# Patient Record
Sex: Male | Born: 1975 | Race: White | Hispanic: No | Marital: Single | Smoking: Former smoker
Health system: Southern US, Community
[De-identification: ages and names within clinical notes are randomized; demographics above are authoritative.]

## PROBLEM LIST (undated history)

## (undated) DIAGNOSIS — G4733 Obstructive sleep apnea (adult) (pediatric): Secondary | ICD-10-CM

## (undated) DIAGNOSIS — I10 Essential (primary) hypertension: Secondary | ICD-10-CM

## (undated) DIAGNOSIS — F319 Bipolar disorder, unspecified: Secondary | ICD-10-CM

## (undated) DIAGNOSIS — K859 Acute pancreatitis without necrosis or infection, unspecified: Secondary | ICD-10-CM

## (undated) DIAGNOSIS — F32A Depression, unspecified: Secondary | ICD-10-CM

## (undated) DIAGNOSIS — E781 Pure hyperglyceridemia: Secondary | ICD-10-CM

## (undated) DIAGNOSIS — E119 Type 2 diabetes mellitus without complications: Secondary | ICD-10-CM

## (undated) DIAGNOSIS — G4730 Sleep apnea, unspecified: Secondary | ICD-10-CM

## (undated) DIAGNOSIS — L702 Acne varioliformis: Secondary | ICD-10-CM

## (undated) DIAGNOSIS — E7800 Pure hypercholesterolemia, unspecified: Secondary | ICD-10-CM

## (undated) DIAGNOSIS — Z87891 Personal history of nicotine dependence: Secondary | ICD-10-CM

## (undated) DIAGNOSIS — R351 Nocturia: Secondary | ICD-10-CM

## (undated) DIAGNOSIS — F419 Anxiety disorder, unspecified: Secondary | ICD-10-CM

## (undated) DIAGNOSIS — J45909 Unspecified asthma, uncomplicated: Secondary | ICD-10-CM

## (undated) DIAGNOSIS — L723 Sebaceous cyst: Secondary | ICD-10-CM

## (undated) HISTORY — DX: Nocturia: R35.1

## (undated) HISTORY — DX: Sebaceous cyst: L72.3

## (undated) HISTORY — DX: Type 2 diabetes mellitus without complications: E11.9

## (undated) HISTORY — DX: Depression, unspecified: F32.A

## (undated) HISTORY — DX: Essential (primary) hypertension: I10

## (undated) HISTORY — DX: Bipolar disorder, unspecified: F31.9

## (undated) HISTORY — DX: Pure hyperglyceridemia: E78.1

## (undated) HISTORY — DX: Personal history of nicotine dependence: Z87.891

## (undated) HISTORY — DX: Pure hypercholesterolemia, unspecified: E78.00

## (undated) HISTORY — DX: Anxiety disorder, unspecified: F41.9

## (undated) HISTORY — PX: OTHER SURGICAL HISTORY: SHX169

## (undated) HISTORY — DX: Unspecified asthma, uncomplicated: J45.909

## (undated) HISTORY — DX: Acne varioliformis: L70.2

## (undated) HISTORY — DX: Sleep apnea, unspecified: G47.30

---

## 2001-07-16 ENCOUNTER — Other Ambulatory Visit (HOSPITAL_COMMUNITY): Admission: RE | Admit: 2001-07-16 | Discharge: 2001-07-23 | Payer: Self-pay | Admitting: *Deleted

## 2001-08-31 ENCOUNTER — Encounter: Admission: RE | Admit: 2001-08-31 | Discharge: 2001-08-31 | Payer: Self-pay | Admitting: *Deleted

## 2001-09-28 ENCOUNTER — Encounter: Admission: RE | Admit: 2001-09-28 | Discharge: 2001-09-28 | Payer: Self-pay | Admitting: *Deleted

## 2001-10-29 ENCOUNTER — Encounter: Admission: RE | Admit: 2001-10-29 | Discharge: 2001-10-29 | Payer: Self-pay | Admitting: *Deleted

## 2002-01-27 ENCOUNTER — Encounter: Admission: RE | Admit: 2002-01-27 | Discharge: 2002-01-27 | Payer: Self-pay | Admitting: *Deleted

## 2002-02-24 ENCOUNTER — Encounter: Admission: RE | Admit: 2002-02-24 | Discharge: 2002-02-24 | Payer: Self-pay | Admitting: *Deleted

## 2002-06-22 ENCOUNTER — Encounter: Admission: RE | Admit: 2002-06-22 | Discharge: 2002-06-22 | Payer: Self-pay | Admitting: *Deleted

## 2002-10-05 ENCOUNTER — Encounter: Admission: RE | Admit: 2002-10-05 | Discharge: 2002-10-05 | Payer: Self-pay | Admitting: *Deleted

## 2004-11-05 ENCOUNTER — Ambulatory Visit: Payer: Self-pay | Admitting: Family Medicine

## 2006-02-09 ENCOUNTER — Ambulatory Visit: Payer: Self-pay | Admitting: Psychiatry

## 2006-02-09 ENCOUNTER — Inpatient Hospital Stay (HOSPITAL_COMMUNITY): Admission: RE | Admit: 2006-02-09 | Discharge: 2006-02-12 | Payer: Self-pay | Admitting: Psychiatry

## 2006-04-27 ENCOUNTER — Ambulatory Visit: Payer: Self-pay | Admitting: Family Medicine

## 2006-05-16 ENCOUNTER — Emergency Department (HOSPITAL_COMMUNITY): Admission: EM | Admit: 2006-05-16 | Discharge: 2006-05-16 | Payer: Self-pay | Admitting: Emergency Medicine

## 2006-05-18 ENCOUNTER — Inpatient Hospital Stay (HOSPITAL_COMMUNITY): Admission: RE | Admit: 2006-05-18 | Discharge: 2006-05-24 | Payer: Self-pay | Admitting: Psychiatry

## 2006-05-18 ENCOUNTER — Emergency Department (HOSPITAL_COMMUNITY): Admission: EM | Admit: 2006-05-18 | Discharge: 2006-05-18 | Payer: Self-pay | Admitting: Emergency Medicine

## 2006-05-19 ENCOUNTER — Ambulatory Visit: Payer: Self-pay | Admitting: Psychiatry

## 2006-12-11 ENCOUNTER — Emergency Department (HOSPITAL_COMMUNITY): Admission: EM | Admit: 2006-12-11 | Discharge: 2006-12-12 | Payer: Self-pay | Admitting: Emergency Medicine

## 2006-12-14 ENCOUNTER — Ambulatory Visit: Payer: Self-pay | Admitting: Pulmonary Disease

## 2006-12-14 ENCOUNTER — Inpatient Hospital Stay (HOSPITAL_COMMUNITY): Admission: EM | Admit: 2006-12-14 | Discharge: 2006-12-25 | Payer: Self-pay | Admitting: Emergency Medicine

## 2006-12-14 ENCOUNTER — Ambulatory Visit: Payer: Self-pay | Admitting: Family Medicine

## 2006-12-16 ENCOUNTER — Ambulatory Visit: Payer: Self-pay | Admitting: Internal Medicine

## 2006-12-25 ENCOUNTER — Ambulatory Visit: Payer: Self-pay | Admitting: Internal Medicine

## 2006-12-30 ENCOUNTER — Ambulatory Visit: Payer: Self-pay | Admitting: Family Medicine

## 2007-01-05 ENCOUNTER — Ambulatory Visit: Payer: Self-pay | Admitting: Internal Medicine

## 2007-01-06 ENCOUNTER — Ambulatory Visit: Payer: Self-pay | Admitting: Family Medicine

## 2007-01-06 LAB — CONVERTED CEMR LAB
AST: 23 units/L (ref 0–37)
Alkaline Phosphatase: 85 units/L (ref 39–117)
Bilirubin, Direct: 0.1 mg/dL (ref 0.0–0.3)
Cholesterol: 223 mg/dL (ref 0–200)
HDL: 40.1 mg/dL (ref >39.0)
Lipase: 61 units/L — ABNORMAL HIGH (ref 11.0–59.0)
Total Protein: 7.8 g/dL (ref 6.0–8.3)
VLDL: 70 mg/dL — ABNORMAL HIGH (ref 0–40)

## 2007-01-07 ENCOUNTER — Ambulatory Visit: Payer: Self-pay | Admitting: Cardiology

## 2007-01-20 ENCOUNTER — Encounter: Admission: RE | Admit: 2007-01-20 | Discharge: 2007-04-20 | Payer: Self-pay | Admitting: Family Medicine

## 2007-02-03 ENCOUNTER — Ambulatory Visit: Payer: Self-pay | Admitting: Family Medicine

## 2007-03-08 ENCOUNTER — Ambulatory Visit: Payer: Self-pay | Admitting: Internal Medicine

## 2007-03-09 ENCOUNTER — Ambulatory Visit: Payer: Self-pay | Admitting: Family Medicine

## 2007-03-23 ENCOUNTER — Ambulatory Visit: Payer: Self-pay | Admitting: Cardiology

## 2007-03-29 ENCOUNTER — Ambulatory Visit: Payer: Self-pay | Admitting: Family Medicine

## 2007-05-03 ENCOUNTER — Ambulatory Visit: Payer: Self-pay | Admitting: Family Medicine

## 2007-07-05 DIAGNOSIS — E119 Type 2 diabetes mellitus without complications: Secondary | ICD-10-CM

## 2007-07-05 HISTORY — DX: Type 2 diabetes mellitus without complications: E11.9

## 2007-08-03 ENCOUNTER — Ambulatory Visit: Payer: Self-pay | Admitting: Family Medicine

## 2007-08-03 DIAGNOSIS — L723 Sebaceous cyst: Secondary | ICD-10-CM | POA: Insufficient documentation

## 2007-08-03 HISTORY — DX: Sebaceous cyst: L72.3

## 2007-08-03 LAB — CONVERTED CEMR LAB
BUN: 10 mg/dL (ref 6–23)
Bilirubin, Direct: 0.1 mg/dL (ref 0.0–0.3)
CO2: 30 meq/L (ref 19–32)
Sodium: 140 meq/L (ref 135–145)
Total Protein: 7.4 g/dL (ref 6.0–8.3)

## 2007-08-10 ENCOUNTER — Telehealth: Payer: Self-pay | Admitting: Family Medicine

## 2007-09-10 ENCOUNTER — Ambulatory Visit (HOSPITAL_COMMUNITY): Admission: RE | Admit: 2007-09-10 | Discharge: 2007-09-10 | Payer: Self-pay | Admitting: Surgery

## 2007-09-10 ENCOUNTER — Encounter (INDEPENDENT_AMBULATORY_CARE_PROVIDER_SITE_OTHER): Payer: Self-pay | Admitting: Surgery

## 2007-10-18 ENCOUNTER — Telehealth: Payer: Self-pay | Admitting: Family Medicine

## 2007-10-29 ENCOUNTER — Ambulatory Visit: Payer: Self-pay | Admitting: Family Medicine

## 2007-10-29 LAB — CONVERTED CEMR LAB
ALT: 58 units/L — ABNORMAL HIGH (ref 0–53)
AST: 37 units/L (ref 0–37)
BUN: 14 mg/dL (ref 6–23)
Basophils Relative: 0.8 % (ref 0.0–1.0)
Bilirubin, Direct: 0.2 mg/dL (ref 0.0–0.3)
Creatinine, Ser: 0.9 mg/dL (ref 0.4–1.5)
Eosinophils Relative: 1.7 % (ref 0.0–5.0)
GFR calc Af Amer: 128 mL/min
Glucose, Bld: 91 mg/dL (ref 70–99)
HCT: 43 % (ref 39.0–52.0)
Hemoglobin: 14.9 g/dL (ref 13.0–17.0)
Monocytes Absolute: 0.7 10*3/uL (ref 0.2–0.7)
Neutro Abs: 4.1 10*3/uL (ref 1.4–7.7)
Neutrophils Relative %: 57.5 % (ref 43.0–77.0)
Platelets: 226 10*3/uL (ref 150–400)
RDW: 12 % (ref 11.5–14.6)
Sodium: 137 meq/L (ref 135–145)
TSH: 1.81 microintl units/mL (ref 0.35–5.50)
Total Protein: 7.5 g/dL (ref 6.0–8.3)

## 2008-07-29 ENCOUNTER — Ambulatory Visit: Payer: Self-pay | Admitting: Family Medicine

## 2008-09-15 ENCOUNTER — Ambulatory Visit: Payer: Self-pay | Admitting: Family Medicine

## 2008-09-15 DIAGNOSIS — J069 Acute upper respiratory infection, unspecified: Secondary | ICD-10-CM | POA: Insufficient documentation

## 2008-09-15 DIAGNOSIS — B9789 Other viral agents as the cause of diseases classified elsewhere: Secondary | ICD-10-CM

## 2009-07-26 ENCOUNTER — Ambulatory Visit: Payer: Self-pay | Admitting: Family Medicine

## 2009-07-31 ENCOUNTER — Ambulatory Visit: Payer: Self-pay | Admitting: Family Medicine

## 2009-07-31 DIAGNOSIS — Z87891 Personal history of nicotine dependence: Secondary | ICD-10-CM | POA: Insufficient documentation

## 2009-07-31 DIAGNOSIS — R040 Epistaxis: Secondary | ICD-10-CM | POA: Insufficient documentation

## 2009-07-31 HISTORY — DX: Personal history of nicotine dependence: Z87.891

## 2009-12-11 ENCOUNTER — Telehealth: Payer: Self-pay | Admitting: Family Medicine

## 2009-12-12 ENCOUNTER — Ambulatory Visit: Payer: Self-pay | Admitting: Family Medicine

## 2009-12-12 DIAGNOSIS — M25569 Pain in unspecified knee: Secondary | ICD-10-CM | POA: Insufficient documentation

## 2009-12-13 LAB — CONVERTED CEMR LAB
AST: 29 units/L (ref 0–37)
Basophils Absolute: 0.1 10*3/uL (ref 0.0–0.1)
Bilirubin, Direct: 0 mg/dL (ref 0.0–0.3)
CO2: 28 meq/L (ref 19–32)
Creatinine,U: 146.2 mg/dL
Eosinophils Absolute: 0.3 10*3/uL (ref 0.0–0.7)
Eosinophils Relative: 3.5 % (ref 0.0–5.0)
GFR calc non Af Amer: 104.44 mL/min (ref >60)
Glucose, Bld: 72 mg/dL (ref 70–99)
Hemoglobin: 14.1 g/dL (ref 13.0–17.0)
MCHC: 33.2 g/dL (ref 30.0–36.0)
MCV: 93.9 fL (ref 78.0–100.0)
Monocytes Absolute: 0.8 10*3/uL (ref 0.1–1.0)
Monocytes Relative: 10.6 % (ref 3.0–12.0)
Potassium: 3.9 meq/L (ref 3.5–5.1)
RBC: 4.53 M/uL (ref 4.22–5.81)
Sodium: 140 meq/L (ref 135–145)
TSH: 3.82 microintl units/mL (ref 0.35–5.50)
WBC: 7.5 10*3/uL (ref 4.5–10.5)

## 2010-01-10 ENCOUNTER — Ambulatory Visit: Payer: Self-pay | Admitting: Family Medicine

## 2010-07-04 ENCOUNTER — Ambulatory Visit: Payer: Self-pay | Admitting: Family Medicine

## 2010-07-04 DIAGNOSIS — L702 Acne varioliformis: Secondary | ICD-10-CM

## 2010-07-04 DIAGNOSIS — R351 Nocturia: Secondary | ICD-10-CM | POA: Insufficient documentation

## 2010-07-04 DIAGNOSIS — L709 Acne, unspecified: Secondary | ICD-10-CM | POA: Insufficient documentation

## 2010-07-04 HISTORY — DX: Nocturia: R35.1

## 2010-07-04 HISTORY — DX: Acne varioliformis: L70.2

## 2010-07-04 LAB — CONVERTED CEMR LAB
ALT: 36 units/L (ref 0–53)
AST: 25 units/L (ref 0–37)
Basophils Absolute: 0 10*3/uL (ref 0.0–0.1)
Basophils Relative: 0.6 % (ref 0.0–3.0)
Blood in Urine, dipstick: NEGATIVE
CO2: 25 meq/L (ref 19–32)
Cholesterol: 243 mg/dL — ABNORMAL HIGH (ref 0–200)
Creatinine, Ser: 1 mg/dL (ref 0.4–1.5)
Creatinine,U: 199.2 mg/dL
Eosinophils Relative: 3.7 % (ref 0.0–5.0)
GFR calc non Af Amer: 95.45 mL/min (ref >60)
Glucose, Urine, Semiquant: NEGATIVE
Hemoglobin: 14.4 g/dL (ref 13.0–17.0)
Ketones, urine, test strip: NEGATIVE
Lymphs Abs: 2.1 10*3/uL (ref 0.7–4.0)
MCV: 91.3 fL (ref 78.0–100.0)
Microalb Creat Ratio: 0.4 mg/g (ref 0.0–30.0)
Microalb, Ur: 0.7 mg/dL (ref 0.0–1.9)
Neutro Abs: 3.5 10*3/uL (ref 1.4–7.7)
Platelets: 179 10*3/uL (ref 150.0–400.0)
Protein, U semiquant: NEGATIVE
Specific Gravity, Urine: 1.02
Total Bilirubin: 0.4 mg/dL (ref 0.3–1.2)
Triglycerides: 773 mg/dL — ABNORMAL HIGH (ref 0.0–149.0)
Urobilinogen, UA: 0.2
VLDL: 154.6 mg/dL — ABNORMAL HIGH (ref 0.0–40.0)
pH: 6

## 2010-07-18 ENCOUNTER — Ambulatory Visit: Payer: Self-pay | Admitting: Family Medicine

## 2010-07-22 ENCOUNTER — Ambulatory Visit: Payer: Self-pay | Admitting: Family Medicine

## 2010-08-12 ENCOUNTER — Encounter
Admission: RE | Admit: 2010-08-12 | Discharge: 2010-08-12 | Payer: Self-pay | Source: Home / Self Care | Attending: Family Medicine | Admitting: Family Medicine

## 2010-10-07 ENCOUNTER — Ambulatory Visit: Payer: Self-pay | Admitting: Family Medicine

## 2010-10-07 LAB — CONVERTED CEMR LAB
Calcium: 9.5 mg/dL (ref 8.4–10.5)
Cholesterol: 286 mg/dL — ABNORMAL HIGH (ref 0–200)
Creatinine, Ser: 1 mg/dL (ref 0.4–1.5)
Glucose, Bld: 99 mg/dL (ref 70–99)
Potassium: 4.2 meq/L (ref 3.5–5.1)
Total CHOL/HDL Ratio: 7
Triglycerides: 788 mg/dL — ABNORMAL HIGH (ref 0.0–149.0)

## 2010-10-14 ENCOUNTER — Ambulatory Visit: Payer: Self-pay | Admitting: Family Medicine

## 2010-10-14 DIAGNOSIS — E781 Pure hyperglyceridemia: Secondary | ICD-10-CM

## 2010-10-14 HISTORY — DX: Pure hyperglyceridemia: E78.1

## 2010-10-30 ENCOUNTER — Encounter: Payer: Self-pay | Admitting: Family Medicine

## 2010-11-26 NOTE — Assessment & Plan Note (Signed)
Summary: FLU-SHOT/TETNUS SHOT/RCD   Nurse Visit   Allergies: 1)  ! Paxil 2)  ! Depakote 3)  ! * Concerta 4)  ! Oxycodone Hcl  Immunization History:  Tetanus/Td Immunization History:    Tetanus/Td:  Tdap (07/22/2010)  Influenza Immunization History:    Influenza:  Fluvax 3+ (07/22/2010)  Immunizations Administered:  Tetanus Vaccine:    Vaccine Type: Tdap    Site: left deltoid    Mfr: GlaxoSmithKline    Dose: 0.5 ml    Route: IM    Given by: Kern Reap CMA (AAMA)    Exp. Date: 08/15/2012    Lot #: [id number]    Physician counseled: yes  Influenza Vaccine # 1:    Vaccine Type: Fluvax 3+    Site: right deltoid    Mfr: GlaxoSmithKline    Dose: 0.5 ml    Route: IM    Given by: Kern Reap CMA (AAMA)    Exp. Date: 04/26/2011    Lot #: [id number]    Physician counseled: yes  Orders Added: 1)  Tdap => 49yrs IM [90715] 2)  Admin 1st Vaccine [90471] 3)  Flu Vaccine 41yrs + [29562] 4)  Admin of Any Addtl Vaccine [13086]

## 2010-11-26 NOTE — Progress Notes (Signed)
  Phone Note Call from Patient   Caller: Patient Call For: Roderick Pee MD Summary of Call: C/o knee pain- wants to know if he should see specialist or Dr Tawanna Cooler Initial call taken by: Raechel Ache, RN,  December 11, 2009 1:31 PM  Follow-up for Phone Call        [facility] to call for appt with Dr todd. Follow-up by: Raechel Ache, RN,  December 11, 2009 1:31 PM

## 2010-11-26 NOTE — Assessment & Plan Note (Signed)
Summary: ?MED PT IS URINATING IN SLEEP/NJR   Vital Signs:  Patient profile:   35 year old male Weight:      229 pounds Temp:     98.1 degrees F oral BP sitting:   130 / 90  (left arm) Cuff size:   regular  Vitals Entered By: Kathrynn Speed CMA (July 04, 2010 9:26 AM) CC: med or electric blanketing pt is not wake to urinate he is wetting his self, src Is Patient Diabetic? Yes   CC:  med or electric blanketing pt is not wake to urinate he is wetting his self and src.  History of Present Illness: Cody Short is a 35 year old single male, who comes in today with a 3 month history of frequent urination and nocturia.  He has a history of diabetes in the past, which he's been able to control with diet and exercise.  However, most recently, he gotten off his diet he's gained weight.  He septa to 29, and he status the frequency of urination.  Review of systems otherwise negative.  He also has problems with a fungal infection of two toenails and his acne on his back has flared up............ when he was a teenager.  He was treated with Accutane.  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 1 Qam  3  Pm 2)  Fish Oil  Oil (Fish Oil) .... Take 2 Tabs Two Times A Day 3)  Seroquel Xr 400 Mg Xr24h-Tab (Quetiapine Fumarate) .... Take 2 Tabs At Bedtime 4)  Doxycycline Hyclate 100 Mg Caps (Doxycycline Hyclate) .... Take 1 Tablet By Mouth Two Times A Day  Allergies (verified): 1)  ! Paxil 2)  ! Depakote 3)  ! * Concerta 4)  ! Oxycodone Hcl  Past History:  Past medical, surgical, family and social histories (including risk factors) reviewed for relevance to current acute and chronic problems.  Past Medical History: Reviewed history from 07/05/2007 and no changes required. Bipolar Acne Drug  Abuse Diabetes mellitus, type II  Past Surgical History: Reviewed history from 07/05/2007 and no changes required. Denies surgical history  Family History: Reviewed history and no changes required.  Social History: Reviewed history from 12/12/2009 and no changes required. Former Smoker  quit 08 Alcohol use-no  Review of Systems      See HPI  Physical Exam  General:  Well-developed,well-nourished,in no acute distress; alert,appropriate and cooperative throughout examination Skin:  2+ posterior acne on his back.......... the left great toenail and the third nail have fungus, and they were trimmed   Problems:  Medical Problems Added: 1)  Dx of Acne Varioliformis  (ICD-706.0) 2)  Dx of Diabetes-type 2  (ICD-250.00) 3)  Dx of Nocturia  (ZOX-096.04)  Impression & Recommendations:  Problem # 1:  DIABETES-TYPE 2 (ICD-250.00) Assessment New  His updated medication list for this problem includes:    Metformin Hcl 500 Mg Tabs (Metformin hcl) .Marland Kitchen... 1/2 qam  Orders: Venipuncture (54098) TLB-Lipid Panel (80061-LIPID) TLB-BMP (Basic Metabolic Panel-BMET) (80048-METABOL) TLB-CBC Platelet - w/Differential (85025-CBCD) TLB-Hepatic/Liver Function Pnl (80076-HEPATIC) TLB-TSH (Thyroid Stimulating Hormone) (84443-TSH) TLB-A1C / Hgb A1C (Glycohemoglobin) (83036-A1C) TLB-Microalbumin/Creat Ratio, Urine (82043-MALB) Diabetic Clinic Referral (Diabetic) Prescription Created Electronically [phone]) Specimen Handling (78295)  Problem # 2:  ACNE VARIOLIFORMIS (ICD-706.0) Assessment: Deteriorated  Orders: Prescription Created Electronically [id number])  Complete Medication List: 1)  Lithium Carbonate 300 Mg Caps (Lithium carbonate) .Marland Kitchen.. 1 qam  3  pm 2)  Fish Oil Oil (Fish oil) .... Take 2 tabs two times a day 3)  Seroquel Xr 400 Mg Xr24h-tab (Quetiapine fumarate) .... Take 2 tabs at bedtime 4)  Doxycycline Hyclate 100 Mg Caps (Doxycycline hyclate) .... Take 1 tablet by mouth two times a  day 5)  Onetouch Basic System W/device Kit (Blood glucose monitoring suppl) .... Use daily for glucose control 6)  Metformin Hcl 500 Mg Tabs (Metformin hcl) .... 1/2 qam 7)  Septra Ds 800-160 Mg Tabs (Sulfamethoxazole-trimethoprim) .Marland Kitchen.. 1 tab @ bedtime for acne 8)  Onetouch Test Strp (Glucose blood) .... Use once daily for fasting glucose test 9)  Onetouch Lancets Misc (Lancets) .... Use once daily for glucose testing  Other Orders: UA Dipstick w/o Micro (manual) (04540)  Patient Instructions: 1)  stay on a 2000-calorie no carbohydrate diet. 2)  Walk 30 minutes daily, drink, 30 ounces of water daily, begin Glucophage 250 mg one tablet prior to breakfast.  Check a fasting blood sugar daily in the morning.  Return in two weeks for follow-up.  We will also get you set up to go to see the dietitian. 3)  saok file  y  nails weekly. 4)  Begin Septra one daily at bedtime for the acne Prescriptions: ONETOUCH LANCETS  MISC (LANCETS) use once daily for glucose testing  #90 x 1   Entered by:   Kern Reap CMA (AAMA)   Authorized by:   Roderick Pee MD   Signed by:   Kern Reap CMA (AAMA) on 07/04/2010   Method used:   Electronically to        [facility]ail[facility]       [address]       [place], [place]  [address]       Ph: [phone]       Fax: [phone]   RxID:   [id number] ONETOUCH TEST  STRP (GLUCOSE BLOOD) use once daily for fasting glucose test  #90 x 1   Entered by:   Kern Reap CMA (AAMA)   Authorized by:   Roderick Pee MD   Signed by:   Kern Reap CMA (AAMA) on 07/04/2010   Method used:   Electronically to        [facility]       [address]       [place], [place]  [address]       Ph: [phone]       Fax: [phone]   RxID:   [id number] SEPTRA DS 800-160 MG TABS (SULFAMETHOXAZOLE-TRIMETHOPRIM) 1 tab @ bedtime for acne  #100 x 3   Entered and Authorized by:    Roderick Pee MD   Signed by:   Roderick Pee MD on 07/04/2010   Method used:   Electronically to        [facility]       [address]       [place], [place]  [address]       Ph: [phone]       Fax: [phone]   RxID:   [id number] METFORMIN HCL 500 MG TABS (METFORMIN HCL) 1/2 qam  #100 x 2   Entered and Authorized by:   Roderick Pee MD  Signed by:   Roderick Pee MD on 07/04/2010   Method used:   Electronically to        [facility]       [address]       [place], [place]  [address]       Ph: [phone]       Fax: [phone]   RxID:   [id number]   Laboratory Results   Urine Tests  Date/Time Received: July 04, 2010   Routine Urinalysis   Color: yellow Appearance: Clear Glucose: negative   (Normal Range: Negative) Bilirubin: negative   (Normal Range: Negative) Ketone: negative   (Normal Range: Negative) Spec. Gravity: 1.020   (Normal Range: 1.003-1.035) Blood: negative   (Normal Range: Negative) pH: 6.0   (Normal Range: 5.0-8.0) Protein: negative   (Normal Range: Negative) Urobilinogen: 0.2   (Normal Range: 0-1) Nitrite: negative   (Normal Range: Negative) Leukocyte Esterace: negative   (Normal Range: Negative)    Comments: Kern Reap CMA (AAMA)  July 04, 2010 10:08 AM

## 2010-11-26 NOTE — Assessment & Plan Note (Signed)
Summary: 2 week fup//ccm   Vital Signs:  Patient profile:   35 year old male Height:      69 inches Weight:      218 pounds BMI:     32.31 Temp:     98.5 degrees F oral BP sitting:   124 / 80  (left arm) Cuff size:   regular  Vitals Entered By: Kern Reap CMA Duncan Dull) (July 18, 2010 11:40 AM)  CC: follow-up visit Is Patient Diabetic? Yes Did you bring your meter with you today? No Pain Assessment Patient in pain? no        CC:  follow-up visit.  History of Present Illness: Cody Short is a 35 year old single male, type II diabetic, who comes in today for evaluation.  He is on metformin 500 mg one half tab prior to breakfast.  Blood sugars have dropped back to normal.  Hemoglobin A1c6 .2%.  His psychiatrist has decreased his Seroquel, which is helped him.  He is less sleepy.  Also, his triglycerides are markedly elevated.  We started him on Septra DS, one daily for severe acne.  His acne is much improved.  He is soaking and filing his nails weekly because he has a fungal infection in his toenails.  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    Allergies: 1)  ! Paxil 2)  ! Depakote 3)  ! * Concerta 4)  ! Oxycodone Hcl  Past History:  Past medical, surgical, family and social histories (including risk factors) reviewed for relevance to current acute and chronic problems.  Past Medical History: Reviewed history from 07/05/2007 and no changes required. Bipolar Acne Drug Abuse Diabetes mellitus, type II  Past Surgical History: Reviewed history from 07/05/2007 and no changes required. Denies surgical history  Family History: Reviewed history and no changes required. Family History Diabetes 1st degree relative  Social History: Reviewed history from 12/12/2009 and no changes  required. Former Smoker  quit 08 Alcohol use-no Single Drug use-no Regular exercise-yes Drug Use:  no Does Patient Exercise:  yes  Review of Systems      See HPI  Physical Exam  General:  Well-developed,well-nourished,in no acute distress; alert,appropriate and cooperative throughout examination Skin:  about a 50% decrease in the posterior lesions on his back   Impression & Recommendations:  Problem # 1:  ACNE VARIOLIFORMIS (ICD-706.0) Assessment Improved  Problem # 2:  DIABETES-TYPE 2 (ICD-250.00) Assessment: Improved  His updated medication list for this problem includes:    Metformin Hcl 500 Mg Tabs (Metformin hcl) .Marland Kitchen... 1/2 qam  Complete Medication List: 1)  Lithium Carbonate 300 Mg Caps (Lithium carbonate) .Marland Kitchen.. 1 qam  3  pm 2)  Fish Oil Oil (Fish oil) .... Take 2 tabs two times a day 3)  Seroquel Xr 400 Mg Xr24h-tab (Quetiapine fumarate) .... Take 1 and half  tabs at bedtime 4)  Doxycycline Hyclate 100 Mg Caps (Doxycycline hyclate) .... Take 1 tablet by mouth two times a day 5)  Onetouch Basic System W/device Kit (Blood glucose monitoring suppl) .... Use daily for glucose control 6)  Metformin Hcl 500 Mg Tabs (Metformin hcl) .... 1/2 qam 7)  Septra Ds 800-160 Mg Tabs (Sulfamethoxazole-trimethoprim) .Marland Kitchen.. 1 tab @ bedtime for acne 8)  Onetouch Test Strp (Glucose blood) .... Use once daily  for fasting glucose test 9)  Onetouch Lancets Misc (Lancets) .... Use once daily for glucose testing  Patient Instructions: 1)  See your eye doctor yearly to check for diabetic eye damage. 2)  continue the Septra once daily for your skin. 3)  Continue the metformin one half tablet prior to breakfast and check a fasting blood sugar Monday, Wednesday, Friday. 4)  Continue your exercise program 5)  Please schedule a follow-up appointment in 3 months...250.01 6)  BMP prior to visit, ICD-9: 7)  Lipid Panel prior to visit, ICD-9: 8)  HbgA1C prior to visit, ICD-9:

## 2010-11-26 NOTE — Assessment & Plan Note (Signed)
Summary: knee pain//ccm   Vital Signs:  Patient profile:   35 year old male Weight:      216 pounds Temp:     99.0 degrees F oral BP sitting:   120 / 84  (left arm) Cuff size:   regular  Vitals Entered By: Kern Reap CMA Duncan Dull) (December 12, 2009 9:43 AM)  Reason for Visit left knee pain  History of Present Illness: Cody Short is a 35 year old single male, who comes in today for evaluation of pain in his left knee x 2 months.  No history of trauma.  He points to the retinaculum and the medial joint line as the source of his pain.  He has no swelling or locking.  No history of trauma.  His last cigarette was 3 years ago.  His fasting blood sugar is 100  Allergies: 1)  ! Paxil 2)  ! Depakote 3)  ! * Concerta 4)  ! Oxycodone Hcl  Social History: Reviewed history from 07/05/2007 and no changes required. Former Smoker  quit 08 Alcohol use-no  Review of Systems      See HPI  Physical Exam  General:  Well-developed,well-nourished,in no acute distress; alert,appropriate and cooperative throughout examination Msk:  No deformity or scoliosis noted of thoracic or lumbar spine.   Pulses:  R and L carotid,radial,femoral,dorsalis pedis and posterior tibial pulses are full and equal bilaterally Extremities:  No clubbing, cyanosis, edema, or deformity noted with normal full range of motion of all joints.   Neurologic:  No cranial nerve deficits noted. Station and gait are normal. Plantar reflexes are down-going bilaterally. DTRs are symmetrical throughout. Sensory, motor and coordinative functions appear intact.   Impression & Recommendations:  Problem # 1:  KNEE PAIN, LEFT (ICD-719.46) Assessment New  Orders: Venipuncture (66440) TLB-BMP (Basic Metabolic Panel-BMET) (80048-METABOL) TLB-TSH (Thyroid Stimulating Hormone) (84443-TSH) TLB-CBC Platelet - w/Differential (85025-CBCD) TLB-Hepatic/Liver Function Pnl (80076-HEPATIC) TLB-A1C / Hgb A1C (Glycohemoglobin)  (83036-A1C) TLB-Microalbumin/Creat Ratio, Urine (82043-MALB) T-Knee Comp Left 4 Views (34742VZ)  Complete Medication List: 1)  Lithium Carbonate 300 Mg Caps (Lithium carbonate) .Marland Kitchen.. 1 qam  3  pm 2)  Fish Oil Oil (Fish oil) .... Take 2 tabs two times a day 3)  Seroquel Xr 400 Mg Xr24h-tab (Quetiapine fumarate) .... Take 2 tabs at bedtime  Patient Instructions: 1)  take 600 mg of Motrin 3 times a day with food.  Elevate and ice for 15 minutes prior to bedtime.  Goes to the main office now for x-rays of the left knee.  I will callu   The report tomorrow

## 2010-11-26 NOTE — Assessment & Plan Note (Signed)
Summary: cpx/cjr   History of Present Illness: Cody Short is a 35 year old single male, nonsmoker, who comes in today for physical examination  He has a history of underlying bipolar depression and is on a combination of Seroquel 400 mg dose two tabs nightly and lithium 300 mg dose one the a.m. 3 in the p.m..  His psychiatrist is Dr. Haywood Lasso, who he sees every 3 months.  He continues to have pain in his left knee, but is much improved.  He went to see the orthopedist.  Evaluation there also was negative.  He was given an anti-inflammatory and start on physical therapy.  However he developed abdominal pain.  Therefore advised to stop the anti-inflammatory.  Because of his previous history of hyperglycemia.  He monitors his blood sugar frequently.  Blood sugar 72, with an A1c of 6.0%.  as a teenager.  He had 3 rounds of Accutane for severe acne.  He has some minor and acne on his back he would like treated  Allergies: 1)  ! Paxil 2)  ! Depakote 3)  ! * Concerta 4)  ! Oxycodone Hcl  Past History:  Past medical, surgical, family and social histories (including risk factors) reviewed, and no changes noted (except as noted below).  Past Medical History: Reviewed history from 07/05/2007 and no changes required. Bipolar Acne Drug Abuse Diabetes mellitus, type II  Past Surgical History: Reviewed history from 07/05/2007 and no changes required. Denies surgical history  Family History: Reviewed history and no changes required.  Social History: Reviewed history from 12/12/2009 and no changes required. Former Smoker  quit 08 Alcohol use-no  Review of Systems      See HPI  Physical Exam  General:  Well-developed,well-nourished,in no acute distress; alert,appropriate and cooperative throughout examination Head:  Normocephalic and atraumatic without obvious abnormalities. No apparent alopecia or balding. Eyes:  No corneal or conjunctival inflammation noted. EOMI. Perrla. Funduscopic  exam benign, without hemorrhages, exudates or papilledema. Vision grossly normal. Ears:  External ear exam shows no significant lesions or deformities.  Otoscopic examination reveals clear canals, tympanic membranes are intact bilaterally without bulging, retraction, inflammation or discharge. Hearing is grossly normal bilaterally. Nose:  External nasal examination shows no deformity or inflammation. Nasal mucosa are pink and moist without lesions or exudates. Mouth:  Oral mucosa and oropharynx without lesions or exudates.  Teeth in good repair. Neck:  No deformities, masses, or tenderness noted. Chest Wall:  No deformities, masses, tenderness or gynecomastia noted. Breasts:  No masses or gynecomastia noted Lungs:  Normal respiratory effort, chest expands symmetrically. Lungs are clear to auscultation, no crackles or wheezes. Heart:  Normal rate and regular rhythm. S1 and S2 normal without gallop, murmur, click, rub or other extra sounds. Abdomen:  Bowel sounds positive,abdomen soft and non-tender without masses, organomegaly or hernias noted. Genitalia:  Testes bilaterally descended without nodularity, tenderness or masses. No scrotal masses or lesions. No penis lesions or urethral discharge. Msk:  No deformity or scoliosis noted of thoracic or lumbar spine.   Pulses:  R and L carotid,radial,femoral,dorsalis pedis and posterior tibial pulses are full and equal bilaterally Extremities:  No clubbing, cyanosis, edema, or deformity noted with normal full range of motion of all joints.   Neurologic:  No cranial nerve deficits noted. Station and gait are normal. Plantar reflexes are down-going bilaterally. DTRs are symmetrical throughout. Sensory, motor and coordinative functions appear intact. Skin:  total body skin exam normal except for some mild acne on his back Cervical Nodes:  No lymphadenopathy  noted Axillary Nodes:  No palpable lymphadenopathy Inguinal Nodes:  No significant adenopathy Psych:   Cognition and judgment appear intact. Alert and cooperative with normal attention span and concentration. No apparent delusions, illusions, hallucinations   Impression & Recommendations:  Problem # 1:  DIABETES MELLITUS, TYPE II (ICD-250.00) Assessment Improved  Orders: Prescription Created Electronically [phone])  Problem # 2:  Preventive Health Care (ICD-V70.0) Assessment: Unchanged  Complete Medication List: 1)  Lithium Carbonate 300 Mg Caps (Lithium carbonate) .Marland Kitchen.. 1 qam  3  pm 2)  Fish Oil Oil (Fish oil) .... Take 2 tabs two times a day 3)  Seroquel Xr 400 Mg Xr24h-tab (Quetiapine fumarate) .... Take 2 tabs at bedtime 4)  Doxycycline Hyclate 100 Mg Caps (Doxycycline hyclate) .... Take 1 tablet by mouth two times a day  Patient Instructions: 1)  continue your current medications. 2)  Check your fasting blood sugar once weekly. 3)  Begin doxycycline 100 mg twice a day for the acne.  On your back remember he can sense at times used to getting sunburned.  Stop the medication 3 to 4 days prior to going to the beach or any prolonged sun exposure. 4)  Stop the anti-inflammatory continue the physical therapy.  Follow-up with orthopedics as needed. 5)  Please schedule a follow-up appointment in 1 year. Prescriptions: DOXYCYCLINE HYCLATE 100 MG CAPS (DOXYCYCLINE HYCLATE) Take 1 tablet by mouth two times a day  #200 x 3   Entered and Authorized by:   Roderick Pee MD   Signed by:   Roderick Pee MD on 01/10/2010   Method used:   Electronically to        [facility]       [address]       [place], [place]  [address]       Ph: [phone]       Fax: [phone]   RxID:   [id number]

## 2010-11-28 NOTE — Miscellaneous (Signed)
Summary: dm eye exam   Clinical Lists Changes  Observations: Added new observation of EYES COMMENT: 09/2011 (10/30/2010 10:49) Added new observation of EYE EXAM BY: bevis (10/02/2010 10:49) Added new observation of DMEYEEXMRES: normal (10/02/2010 10:49) Added new observation of DIAB EYE EX: normal (10/02/2010 10:49)      Diabetes Management History:      He says that he is exercising.    Diabetes Management Exam:    Eye Exam:       Eye Exam done elsewhere          Date: 10/02/2010          Results: normal          Done by: Vonna Kotyk

## 2010-11-28 NOTE — Assessment & Plan Note (Signed)
Summary: 3 MNTH ROV//SLM   Vital Signs:  Patient profile:   35 year old male Weight:      221 pounds Temp:     98.2 degrees F oral BP sitting:   130 / 90  (right arm) Cuff size:   regular  Vitals Entered By: Kern Reap CMA Duncan Dull) (October 14, 2010 3:22 PM) CC: follow-up visit   CC:  follow-up visit.  History of Present Illness: Cody Short is a 35 year old single male, who comes in today for follow-up of diabetes, and hyperlipidemia.  His blood sugars drop back to normal as his A1c is on 250 mg of metformin daily.  Triglycerides dropped from 900 and 700.  His psychiatrist as dropped his Seroquel down from 800 mg daily to 600  On Septra DS, one daily.  His skin has markedly improved........ as a teenager.  He took Accutane, x 3  Allergies: 1)  ! Paxil 2)  ! Depakote 3)  ! * Concerta 4)  ! Oxycodone Hcl  Review of Systems      See HPI  Physical Exam  General:  Well-developed,well-nourished,in no acute distress; alert,appropriate and cooperative throughout examination   Problems:  Medical Problems Added: 1)  Dx of Hypertriglyceridemia  (ICD-272.1)  Impression & Recommendations:  Problem # 1:  DIABETES-TYPE 2 (ICD-250.00) Assessment Improved  His updated medication list for this problem includes:    Metformin Hcl 500 Mg Tabs (Metformin hcl) .Marland Kitchen... 1/2 qam  Problem # 2:  ACNE VARIOLIFORMIS (ICD-706.0) Assessment: Improved  Problem # 3:  HYPERTRIGLYCERIDEMIA (ICD-272.1) Assessment: Improved  Complete Medication List: 1)  Lithium Carbonate 300 Mg Caps (Lithium carbonate) .Marland Kitchen.. 1 qam  3  pm 2)  Seroquel Xr 400 Mg Xr24h-tab (Quetiapine fumarate) .... Take 1 and half  tabs at bedtime 3)  Onetouch Basic System W/device Kit (Blood glucose monitoring suppl) .... Use daily for glucose control 4)  Metformin Hcl 500 Mg Tabs (Metformin hcl) .... 1/2 qam 5)  Septra Ds 800-160 Mg Tabs (Sulfamethoxazole-trimethoprim) .Marland Kitchen.. 1 tab @ bedtime for acne 6)  Onetouch Test Strp  (Glucose blood) .... Use once daily for fasting glucose test 7)  Onetouch Lancets Misc (Lancets) .... Use once daily for glucose testing  Patient Instructions: 1)  continue your exercise and diet program.......... walk 30 minutes daily 2)  Please schedule a follow-up appointment in 3 months......250.00,,,272.00 3)  BMP prior to visit, ICD-9: 4)  Lipid Panel prior to visit, ICD-9: 5)  HbgA1C prior to visit, ICD-9:   Orders Added: 1)  Est. Patient Level III [81191]

## 2011-01-07 ENCOUNTER — Other Ambulatory Visit (INDEPENDENT_AMBULATORY_CARE_PROVIDER_SITE_OTHER): Payer: BC Managed Care – PPO | Admitting: Family Medicine

## 2011-01-07 DIAGNOSIS — E119 Type 2 diabetes mellitus without complications: Secondary | ICD-10-CM

## 2011-01-07 DIAGNOSIS — I10 Essential (primary) hypertension: Secondary | ICD-10-CM

## 2011-01-07 LAB — BASIC METABOLIC PANEL
CO2: 26 mEq/L (ref 19–32)
Creatinine, Ser: 1.2 mg/dL (ref 0.4–1.5)
GFR: 77.39 mL/min (ref >60.00)
Sodium: 140 mEq/L (ref 135–145)

## 2011-01-07 LAB — HEMOGLOBIN A1C: Hgb A1c MFr Bld: 5.6 % (ref 4.6–6.5)

## 2011-01-21 ENCOUNTER — Ambulatory Visit: Payer: Self-pay | Admitting: Family Medicine

## 2011-01-22 ENCOUNTER — Encounter: Payer: Self-pay | Admitting: Family Medicine

## 2011-01-23 ENCOUNTER — Encounter: Payer: Self-pay | Admitting: Family Medicine

## 2011-01-23 ENCOUNTER — Ambulatory Visit (INDEPENDENT_AMBULATORY_CARE_PROVIDER_SITE_OTHER): Payer: BC Managed Care – PPO | Admitting: Family Medicine

## 2011-01-23 DIAGNOSIS — E119 Type 2 diabetes mellitus without complications: Secondary | ICD-10-CM

## 2011-01-23 DIAGNOSIS — E781 Pure hyperglyceridemia: Secondary | ICD-10-CM

## 2011-01-23 DIAGNOSIS — L702 Acne varioliformis: Secondary | ICD-10-CM

## 2011-01-23 MED ORDER — SULFAMETHOXAZOLE-TRIMETHOPRIM 800-160 MG PO TABS: 1.0000 | ORAL_TABLET | Freq: Two times a day (BID) | ORAL | Status: DC

## 2011-01-23 NOTE — Progress Notes (Signed)
  Subjective:    Patient ID: Cody Short, male    DOB: 10-16-1976, 35 y.o.   MRN: [id number] Cody Short is a 35 year old male, who comes in today for evaluation of 3 problems.  He has underlying diabetes, type II controlled with metformin 250 mg daily blood sugar 94 with an A1c of 5.6%.  No hypoglycemia.  He also has a history of chronic acne.  He's now been on Septra DS, one daily for 4 months in his skin is about 90% better.  He also has a history of elevation of his triglycerides or recheck lipid panel HPI    Review of Systems General, endocrinology, and metabolic review of systems otherwise negative    Objective:   Physical Exam Well-developed well-nourished, male in no acute distress.  Examination of the back shows a few small posterior lesions.  However, most of the big lesions have resolved       Assessment & Plan:  Diabetes type 2, under good control,,,,,,,,,,,,,,,, continue current therapy.  Acne, cystic type,,,,,,,,,,,,,,,,, continue Septra, one daily.  ,Hypertriglyceridemia,,,,,,,,,,,,, check lipid panel

## 2011-01-23 NOTE — Patient Instructions (Signed)
Return at 4:30 p.m. For a lipid panel.  Continue the Septra one daily.  Continue the metformin one half tablet daily.  Follow-up blood sugar in 6 months

## 2011-01-24 LAB — LIPID PANEL
Cholesterol: 241 mg/dL — ABNORMAL HIGH (ref 0–200)
HDL: 41.7 mg/dL (ref >39.00)
Total CHOL/HDL Ratio: 6
VLDL: 95.8 mg/dL — ABNORMAL HIGH (ref 0.0–40.0)

## 2011-01-24 LAB — LDL CHOLESTEROL, DIRECT: Direct LDL: 125.3 mg/dL

## 2011-01-28 ENCOUNTER — Telehealth: Payer: Self-pay | Admitting: Family Medicine

## 2011-01-28 NOTE — Telephone Encounter (Signed)
Wants Triglyceride results.

## 2011-01-29 ENCOUNTER — Telehealth: Payer: Self-pay | Admitting: *Deleted

## 2011-01-29 DIAGNOSIS — E781 Pure hyperglyceridemia: Secondary | ICD-10-CM

## 2011-01-29 NOTE — Telephone Encounter (Signed)
Left message on machine for patient  With lab results and referral sent

## 2011-01-29 NOTE — Progress Notes (Signed)
Left message on machine for patient

## 2011-01-29 NOTE — Telephone Encounter (Signed)
Left message on machine for patient

## 2011-01-29 NOTE — Telephone Encounter (Signed)
Message copied by Kern Reap on Wed Jan 29, 2011  1:02 PM ------      Message from: TODD, JEFFREY A      Created: Mon Jan 27, 2011  9:17 AM       Triglycerides still markedly elevated...........Marland Kitchen Let's give him a consult in the lipid clinic in the cardiology office

## 2011-02-06 ENCOUNTER — Ambulatory Visit (INDEPENDENT_AMBULATORY_CARE_PROVIDER_SITE_OTHER): Payer: BC Managed Care – PPO

## 2011-02-06 VITALS — Wt 215.8 lb

## 2011-02-06 DIAGNOSIS — E781 Pure hyperglyceridemia: Secondary | ICD-10-CM

## 2011-02-06 MED ORDER — OMEGA-3 FATTY ACIDS 1000 MG PO CAPS: 2.0000 g | ORAL_CAPSULE | Freq: Every day | ORAL | Status: AC

## 2011-02-06 MED ORDER — ROSUVASTATIN CALCIUM 10 MG PO TABS: 10.0000 mg | ORAL_TABLET | Freq: Every day | ORAL | Status: DC

## 2011-02-06 NOTE — Patient Instructions (Signed)
Start Crestor 10mg  daily.   Start fish oil 2 capsules daily.   Try to cut down on the fast food and fried foods.  If you do go to fast food, try to eat more grilled meats and less french fries.  Try oatmeal for breakfast rather than sausage biscuits   Set a goal to exercise at least 15-20 minutes 3 days a week.   We will recheck you labwork in 1 month.

## 2011-02-06 NOTE — Progress Notes (Signed)
Cody Short is a 35 yo M who presents to [facility] Clinic for initial evaluation.   He was referred by his PCP due to elevated TG.  He has a remote history of pancreatitis.  He has no cardiac history but has DM.  This is currently controlled on metformin 250mg  daily with recent A1c of 5.6.  He does not regularly check his BG at home.  Of note, he is currently on Seroquel, which can be contributing to increase in TG and LDL.    Reviewed family history with pt, but he is a poor historian.  He is not sure if anyone in his family has had hypercholesterolemia or hx of stroke or MI.  He did state diabetes is very common in his family.   He does not currently smoke or drink alcohol.  He quit both 5-6 years ago.    Pt's diet is not very well controlled.  He admits to eating fast food most meals.  For breakfast he may have a fried chicken sandwich or sausage biscuit from [employer].  He will eat a 20 piece Mcnugget meal for lunch or go to [employer] to get fried fish and 2 vegetables.  His mom has recently started cooking dinner at home.  He likes spaghetti with meatballs, ham, and [place].  He drinks water, unsweet tea with Splenda, and an occasional soft drink.  He does sometimes splurge on Ribena drinks (sugary fruit drink from [place]).    Pt works at a [employer].  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  He used to walk on a treadmill but stated he just lost interest in doing this lately.    Current Outpatient Prescriptions on File Prior to Visit  Medication Sig Dispense Refill  . lithium 300 MG capsule Take by mouth. 2 every morning , 3 every evening      . metFORMIN (GLUCOPHAGE) 500 MG tablet Take by mouth. 1/2 every morning       . QUEtiapine (SEROQUEL XR) 400 MG 24 hr tablet Take 400 mg by mouth. 2 at bedtime      . sulfamethoxazole-trimethoprim (BACTRIM DS,SEPTRA DS) 800-160 MG per tablet Take 1 tablet by mouth 2 (two) times daily.  100 tablet  3  . glucose blood (ONE TOUCH  TEST STRIPS) test strip 1 each by Other route daily. Use as instructed       . ONE TOUCH LANCETS MISC by Does not apply route daily.          Allergies  Allergen Reactions  . Divalproex Sodium   . Methylphenidate Hcl   . Oxycodone Hcl     REACTION: hallucinations  . Paroxetine

## 2011-02-06 NOTE — Assessment & Plan Note (Signed)
Pt's cholesterol has been elevated for quite some time.  TC- 241 (goal<200), TG- 479 (goal<150) but improved from 788, HDL- 41.7 (goal>40), and LDL- 125.3 (goal<70).  Some of his issues with cholesterol are likely from antipsychotic medications, but pt's diet is uncontrolled as well.  He has tried fish oil in the past but unsure why he stopped.  He is willing to try again.  Will also start low-dose statin to help lower LDL and TG.  Encouraged pt to make significant lifestyle changes- decreasing fast food, cutting down on fried foods, and starting to exercise.  Will recheck labs in 1 month.

## 2011-03-05 ENCOUNTER — Other Ambulatory Visit (INDEPENDENT_AMBULATORY_CARE_PROVIDER_SITE_OTHER): Payer: BC Managed Care – PPO | Admitting: *Deleted

## 2011-03-05 DIAGNOSIS — E7800 Pure hypercholesterolemia, unspecified: Secondary | ICD-10-CM

## 2011-03-05 DIAGNOSIS — Z79899 Other long term (current) drug therapy: Secondary | ICD-10-CM

## 2011-03-05 LAB — HEPATIC FUNCTION PANEL
AST: 36 U/L (ref 0–37)
Albumin: 4.2 g/dL (ref 3.5–5.2)
Alkaline Phosphatase: 60 U/L (ref 39–117)
Bilirubin, Direct: 0 mg/dL (ref 0.0–0.3)
Total Bilirubin: 0.5 mg/dL (ref 0.3–1.2)

## 2011-03-05 LAB — LIPID PANEL
LDL Cholesterol: 38 mg/dL (ref 0–99)
Total CHOL/HDL Ratio: 3
Triglycerides: 181 mg/dL — ABNORMAL HIGH (ref 0.0–149.0)

## 2011-03-10 ENCOUNTER — Ambulatory Visit (INDEPENDENT_AMBULATORY_CARE_PROVIDER_SITE_OTHER): Payer: BC Managed Care – PPO

## 2011-03-10 VITALS — Wt 214.0 lb

## 2011-03-10 DIAGNOSIS — E781 Pure hyperglyceridemia: Secondary | ICD-10-CM

## 2011-03-10 NOTE — Progress Notes (Signed)
Mr Cody Short is a 35 yo M who presents to Lipid Clinic for follow-up evaluation.  He was placed on Crestor 10 mg as well as fish oil 2 gm daily.  He is tolerating these with no issues.  He has no complaints of muscle pains, aches, chest pain or SOB.   He has decreased his Seroquel from 600 mg daily to 400 mg daily and increased Lithium.    He does not currently smoke or drink alcohol.  He quit both 5-6 years ago.    Pt's has made significant changes in his diet since last visit.  He is still eating fast food but has substituted fried chicken for grilled chicken from [employer] or [employer].  He is not eating any fries with it.  He has been splurging on milkshakes once or twice a week.  His mother is cooking more at home.  He is limiting his fried foods to only once per week.    Pt works at a Warehouse manager.  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  Now that the weather is better, he is doing lawn work for a few of his neighbors.   Current Outpatient Prescriptions on File Prior to Visit  Medication Sig Dispense Refill  . fish oil-omega-3 fatty acids 1000 MG capsule Take 2 capsules (2 g total) by mouth daily.  60 capsule  0  . glucose blood (ONE TOUCH TEST STRIPS) test strip 1 each by Other route daily. Use as instructed       . lithium 300 MG capsule Take by mouth. 3 every morning , 3 every evening      . metFORMIN (GLUCOPHAGE) 500 MG tablet Take by mouth. 1/2 every morning       . ONE TOUCH LANCETS MISC by Does not apply route daily.        . QUEtiapine (SEROQUEL XR) 400 MG 24 hr tablet Take 400 mg by mouth daily. 2 at bedtime      . rosuvastatin (CRESTOR) 10 MG tablet Take 1 tablet (10 mg total) by mouth daily.  30 tablet  0  . sulfamethoxazole-trimethoprim (BACTRIM DS,SEPTRA DS) 800-160 MG per tablet Take 1 tablet by mouth 2 (two) times daily.  100 tablet  3    Allergies  Allergen Reactions  . Divalproex Sodium   . Methylphenidate Hcl   . Oxycodone Hcl    REACTION: hallucinations  . Paroxetine

## 2011-03-10 NOTE — Patient Instructions (Addendum)
Great job on the improvement in your Triglycerides!   Continue your current medications  Continue to cut out fried foods and sweets.  Try to stay with your diet at least 6 days a week and only cheat one day a week.   Recheck labs in 2 months.

## 2011-03-10 NOTE — Assessment & Plan Note (Signed)
Pt's cholesterol significantly improved with addition of Crestor.  TC- 110 (goal<200), TG- 181 (goal<150), HDL- 36 (goal>40), LDL- 38 (goal<70).  Will continue Crestor since pt tolerating well.  As Seroquel is decreased, will consider decreasing Crestor.  Encouraged pt to continue diet changes and that it is okay to have one cheat day per week.  Will recheck labs in 2 months.
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
## 2011-03-11 NOTE — Assessment & Plan Note (Signed)
 HEALTHCARE                         GASTROENTEROLOGY OFFICE NOTE   [name], [name]               MRN:          [id number]  DATE:03/08/2007                            DOB:          October 09, 1976    HISTORY OF PRESENT ILLNESS:  Mr. Cody Short is a 35 year old young man  with acute pancreatitis and pancreatic pseudocyst requiring  hospitalization approximately two months ago.  His followup CT scan of  the abdomen and pancreas on January 07, 2007, showed substantial  improvement of the pseudocyst as well as some pancreatic inflammation.  Clinically, the patient has done excellent. He is not in any abdominal  pain, fever, indigestion, nausea.  He works full time in [employer].  Triglycerides have been checked by Dr. Cher Nakai in the office approximately  four weeks ago, but we are trying to obtain the results.  The patient,  also, has not been informed of the results of the triglycerides.   PROGRAF MEDICATIONS:  1. Lantus insulin 8 units subcu q.p.m.  2. Protonix 40 mg daily.  3. Pancrease tablets twice a day.  4. Abilify 7.5 mg q.h.s.  5. Lamictal 25 mg daily.  6. Humalog sliding scale insulin.   PHYSICAL EXAMINATION:  VITAL SIGNS:  Blood pressure 112/76, pulse 72,  weight 216 pounds.  GENERAL:  He appeared healthy, no distress.  LUNGS:  Clear to auscultation.  COR:  Normal S1, S2.  ABDOMEN:  Soft, nontender, normoactive bowel sounds, no distention.  No  palpable __________.   IMPRESSION:  A 35 year old white male with clinically improved or  resolved pancreatitis.  Last CT scan of the abdomen six weeks ago showed  marked improvement in the prepancreatic edema as well as the partial  resolution of the pseudocyst.   PLAN:  1. Repeat CT scan of the abdomen.  2. Document return of the pancreas to normal.  3. Low fat diet.  4. Find results of the triglycerides done in Dr. Nelida Meuse office.   I would like to see him in about 3-6 months.  I  advised him to let us  know if the pain recurs.     Cody Short. Cody Chance, MD     DMB/MedQ  DD: 03/08/2007  DT: 03/09/2007  Job #: [id number]   cc:   Tinnie Gens A. Tawanna Cooler, MD
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
## 2011-03-14 NOTE — H&P (Signed)
NAME:  ONIS, MARKOFF        ACCOUNT NO.:  [id number]   MEDICAL RECORD NO.:  [id number]          PATIENT TYPE:  INP   LOCATION:  1318                         FACILITY:  [facility]   PHYSICIAN:  Eugenio Hoes. Tawanna Cooler, MD    DATE OF BIRTH:  05-Feb-1976   DATE OF ADMISSION:  12/14/2006  DATE OF DISCHARGE:                              HISTORY & PHYSICAL   Cody Short is a 35 year old male who was admitted to [facility] for diabetes new onset.   The patient has been in the usual state of good health medically until  the last couple of weeks, when he has not felt well.  He has noticed  increased thirst and nocturia and weight loss.  He went to see his  psychiatrist on Friday, was found to have a blood sugar over 200, and  was sent to the emergency room at [facility].  He was evaluated in  the emergency room and told he had diabetes and started on Amaryl 2 mg  daily and sent home.  He was nauseated and had some diffuse abdominal  pain on Friday but otherwise was well.  He continues his antipsychotic  medications, which is Depakote 500 mg four tablets q.h.s., L-Carnitine  2000 mg daily, Equetro 200 mg daily, and dicyclomine 20 mg t.i.d.  He  was extremely nauseated and developed abdominal pain on Saturday, on  Sunday went to Urgent Care.  At Urgent Care he was reevaluated and  diabetes was again confirmed.  A number of diagnostic studies were done.  He was told go home, drink lots of liquids, and come to see his family  doctor on Monday.  On Monday I saw his brother for follow-up of diabetes  at age 81.  Incidentally, the mom said, Diona Browner, do you have an  appointment, I need Kelyn seen.  He is a new-onset diabetic.  I had  the husband bring him immediately, he was seen right away.  After  history and physical exam, he was admitted to the hospital for  evaluation.   PAST MEDICAL HISTORY:  Has been hospitalized at [facility] in [place],  [place], for 2 weeks for acute  depression.  Outpatient surgery:  Cystic lesion of his scrotum.  Past illnesses:  None.  Injuries:  None.   DRUG ALLERGIES:  He gets side effects from Concerta and Paxil, no true  drug allergies.   He currently does not smoke or drink any alcohol.  He has had trouble  with alcohol and drugs in the past.   REVIEW OF SYSTEMS:  Head, eyes, ears, nose and throat were negative.  He  gets regular dental care.  CARDIOPULMONARY:  Negative.  GI:  Pertinent  for the nausea and yesterday he said he threw up about 6 or 7 times.  He  has had a diffuse abdominal pain.  GU:  Negative except for the  nocturia.  ENDOCRINE:  He has never had a history of diabetes in the  past.  There is a positive family history of diabetes, though.  Weight  loss:  He has lost approximately 5-10 pounds over the last 2  weeks.  He  is not sure because he has not been weighing himself.  MUSCULOSKELETAL:  Negative.  VASCULAR:  Negative.  ALLERGIC:  Negative.   SOCIAL HISTORY:  He is single.  He lives here in [place] with his  mother and father.  He has a long history of mental health problems.  He  carries the diagnosis of bipolar depression and a history of drug and  alcohol abuse.  He is currently not abusing drugs nor alcohol.  He is  getting regular psychiatric care and taking his medications as noted  above.   FAMILY HISTORY:  Mom and dad are both in good health, no health  problems.  He has a younger brother who is in good health and no health  problems except also has a history of diabetes.   PHYSICAL EVALUATION:  VITAL SIGNS:  He is 5 feet 9 inches.  He was sick  and unable to stand on the scales.  Weight is approximately 220 pounds.  Pulse was 80 and regular, respirations 12 and regular, BP 130/70.  GENERAL:  He is a well-developed, slightly overweight male, acutely ill,  with nausea and at one point in the middle of the interview he had to go  throw up.  HEENT:  Negative.  NECK:  Supple.  Thyroid was not  enlarged.  No carotid bruits.  CHEST:  Clear to auscultation.  CARDIAC:  Negative.  ABDOMEN:  The abdomen was somewhat distended.  Bowel sounds were  present.  He had diffuse tenderness, no rebound.  EXTREMITIES:  Normal.  SKIN:  Normal.  PERIPHERAL PULSES:  Normal.   Laboratory data from the emergency room shows blood sugar greater than  200.  He was admitted to the hospital for evaluation at that juncture.   IMPRESSION:  1. New-onset diabetes with weight loss, nausea and vomiting, probably      ketotic.  Plan:  Will admit.  IV fluids, insulin, diagnostic workup      along with diabetic teaching, etc.  2. History of bipolar depression.  3. History of drug abuse, currently no drugs.   After the patient was admitted, I got a fax from the Urgent Care where  he was seen on Sunday and it showed some fairly alarming data:  His  cholesterol was over 800 total, triglycerides were over 8000.  A1c was  11.4.  I therefore called the hospital.  He was made n.p.o.  Previously  we were going to give him some clear liquids.  He was made n.p.o.  He  was given Phenergan and Dilaudid.  IV fluids were pushed.  I talked with  our hospitalist, Vikki Ports A. Felicity Coyer, MD, who is going to see him again  today for emergent evaluation.  At that juncture if she felt he was  hemodynamically stable, we would maintain him on the unit where he is.  If, however, he was not, we would move him to the ICU.  With the  elevated triglycerides, etc., He obviously has acute pancreatitis on top  of his diabetes.  I also called and talked with the mother and explained  to her what the problem was and the fact that there was a low but a  significant incidence of mortality with an acute pancreatitis with  triglycerides this high.  She understands his grave situation and  acknowledges the fact that she understood this could be a fatal disease. Hopefully, with intensive care he will survive and do well.  Jeffrey A.  Tawanna Cooler, MD  Electronically Signed     JAT/MEDQ  D:  12/15/2006  T:  12/15/2006  Job:  [id number]

## 2011-03-14 NOTE — Assessment & Plan Note (Signed)
[facility]                                 ON-CALL NOTE   Cody Short, Cody Short                 MRN:          [id number]  DATE:12/13/2006                            DOB:          1976/07/01    CALLER:  Adalberto Ill.   PHONE:  [phone]   SUBJECTIVE:  Second call from the patient's mother.  This morning, Ms.  [name] states that he started having moderately severe abdominal  pain.  As previous notes state, he was recently diagnosed with diabetes,  which may or may not be diabetes Type 1 or Type 2, but had thought to  have been Type 2.  He was started on Glimepiride and was supposed to  follow up on Monday.  He is not feeling well at this point and is having  moderate abdominal pain.  I instructed them to have him try and drink  fluids.  The symptoms may be secondary to the new medication, but I  would be concerned about dehydration and possible DKA/HONK.  She will  take him if the pain is severe to the emergency room and if it is more  moderate to an Urgent Care.     Kerby Nora, MD  Electronically Signed    AB/MedQ  DD: 12/13/2006  DT: 12/13/2006  Job #: [id number]
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
## 2011-03-14 NOTE — Discharge Summary (Signed)
NAMEGEORGE, Cody Short        ACCOUNT NO.:  [id number]   MEDICAL RECORD NO.:  [id number]          PATIENT TYPE:  IPS   LOCATION:  0501                          [facility]ACILITY:  [facility]   PHYSICIAN:  Anselm Jungling, MD  DATE OF BIRTH:  [birth year]   DATE OF ADMISSION:  02/09/2006  DATE OF DISCHARGE:  02/12/2006                                 DISCHARGE SUMMARY   IDENTIFYING DATA AND REASON FOR ADMISSION:  The patient is a 35 year old  single white male admitted for cocaine dependence and depression. He had run  up huge debts secondary to his cocaine use and had also lost his job. He  indicated at the time of admission that he had had previous inpatient  admission else were for similar circumstances. Please refer to the admission  note for further details pertaining to the symptoms, circumstances and  history that led to his hospitalization. He was given an initial Axis I  diagnosis of cocaine dependence, rule out mood disorder NOS, and rule out  substance-induced mood disorder.   MEDICAL AND LABORATORY:  The patient was medically and physically assessed  by the psychiatric nurse practitioner upon admission. He is essentially in  good health without any active or chronic medical problems. There were no  significant medical issues during this brief inpatient psychiatric stay.   HOSPITAL COURSE:  The patient was admitted to the adult inpatient  psychiatric service. He came to [place] on no medications. He was involved in  various therapeutic groups and activities oriented towards individuals with  chemical dependency and psychiatric issues. He presented as a well-  nourished, well-developed, pleasant and polite young man, who very much  indicated his desire to get off cocaine, which he had been battling for some  time.   He did not appear particularly depressed in the sense of clinical or  endogenous depression, although he was appropriately concerned and unhappy  about the many consequences  of his ongoing cocaine problem. He did not  appear to be a strong candidate for antidepressant medication. He denied  suicidal ideation throughout his inpatient stay.   On the second hospital day, there was a family session involving his parents  and brother. The patient reiterated that he was not having any suicidal or  homicidal ideation. He stated that he did not feel he needed follow-up  treatment after discharge. The patient's parents indicated that they would  accept him back in the family home following discharge. They also requested  that the patient think about going to an intensive outpatient program or  other chemical dependency program. The patient was somewhat dismissive of  this, stating that he never plans to use drugs again. His brother also  encouraged him to consider going to a treatment program, but again the  patient dismissed this. He was open to going to individual therapy with a  psychologist or counselor to discuss his various issues. His family overall  indicated that they would continue to support the patient emotionally and  financially until he finds employment.   On the fourth hospital day, the patient was feeling well physically and was  absent any signs  or symptoms of drug withdrawal. He was still absent  suicidal ideation. He indicated that he felt ready for discharge.   AFTERCARE:  The patient was to follow-up at the [facility] where he could  have an assessment on a walk-in basis, and he was recommended to do so on  the day following discharge, February 13, 2006. He was discharged without any  prescribed medications. He was urged to attend daily narcotics anonymous  meetings and was given a list of meetings in his area.   DISCHARGE DIAGNOSES:  AXIS I: Cocaine dependence.  AXIS II: Deferred.  AXIS III: No acute or chronic illnesses.  AXIS IV: Stressors severe.  AXIS V: Global assessment of functioning on discharge 65.            ______________________________  Anselm Jungling, MD  Electronically Signed     SPB/MEDQ  D:  02/13/2006  T:  02/13/2006  Job:  [id number]
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
## 2011-03-14 NOTE — Assessment & Plan Note (Signed)
[facility]                         GASTROENTEROLOGY OFFICE NOTE   Cody Short, Cody Short               MRN:          [id number]  DATE:01/05/2007                            DOB:          June 16, 1976    Cody Short is a 35 year old young man who was recently  hospitalized at [facility] with severe acute pancreatitis  resulting in pancreatic phlegmon and pseudocyst as per CT scan of the  abdomen.  He also had associated left lobe of the lung atelectasis.  He  had hypertriglyceridemia and hyperlipidemia with new diagnosis of type 1  diabetes causing mild diabetic ketoacidosis.  Since his discharge from  the hospital on December 25, 2006, he has done extremely well, keeping  low fat diet.  His blood sugars have been between 90 and 100.  He has  minimal abdominal pain but has not needed any pain medications.   His medications include:  1. Pancrease two tablets twice a day.  2. Lantus insulin 8 units q.h.s.  3. Protonix 40 mg p.o. daily.  4. Abilify 7.5 mg h.s.  5. Lamictal 25 mg p.o. b.i.d.  6. Vicodin 5/500 p.r.n. abdominal pain.   PAST HISTORY:  Significant for bipolar disorder and diabetes.   FAMILY HISTORY:  Positive for diabetes and alcoholism.   SOCIAL HISTORY:  Single.  He lives at home.  Does not currently drink  any alcohol, does not smoke.   REVIEW OF SYSTEMS:  Positive for at times excessive urination,  occasional back pain, excessive thirst.   PHYSICAL EXAMINATION:  VITAL SIGNS:  Blood pressure 110/70, pulse 64,  weight 217 pounds which represents 20-pound weight loss since  prehospitalization.  GENERAL:  He was alert, oriented, in no distress.  LUNGS:  Clear to auscultation.  CORONARY:  Normal S1, normal S2.  ABDOMEN:  Soft, relaxed, with minimal discomfort in the epigastrium and  left upper quadrant, no fullness, no bruit.  Lower abdomen was normal,  liver edge at costal margin.  EXTREMITIES:  No edema.   IMPRESSION:  A 35 year old white male with acute pancreatitis due to  hypertriglyceridemia with initial triglyceride level of 3700.  He is  currently doing well clinically with reduction of the abdominal pain,  ability to eat, and good diabetic control.   PLAN:  1. Continue low-fat diet.  2. Repeat CT scan of the abdomen, pancreatic protocol, with attention      to the pancreatic pseudocyst.  3. Continue Pancrease two p.o. b.i.d.  4. Fasting lipid profile, liver function tests, amylase, lipase.  This      will be done tomorrow in Dr. Nelida Meuse office since he has already      eaten today.  I wrote it down on a prescription pad for him to have      these blood tests done tomorrow.     Cody Short. [name], MD  Electronically Signed    DMB/MedQ  DD: 01/05/2007  DT: 01/05/2007  Job #: [id number]   cc:   Tinnie Gens A. Tawanna Cooler, MD

## 2011-03-14 NOTE — Discharge Summary (Signed)
NAME:  Cody Short, Cody Short        ACCOUNT NO.:  [id number]   MEDICAL RECORD NO.:  [id number]          PATIENT TYPE:  INP   LOCATION:  1337                         FACILITY:  [facility]   PHYSICIAN:  Valetta Mole. Swords, MD    DATE OF BIRTH:  07-21-1976   DATE OF ADMISSION:  12/14/2006  DATE OF DISCHARGE:  12/25/2006                               DISCHARGE SUMMARY   DISCHARGE DIAGNOSES:  1. Severe pancreatitis with resultant pseudocyst and phlegmon. He will      need outpatient followup.  2. Left lower lobe atelectasis.  3. SIRS.  4. Hyperlipidemia with hypertriglyceridemia.  5. Diabetes, new diagnosis, possibly type 1 with diabetic      ketoacidosis.  6. Bipolar disorder.   HOSPITAL PROCEDURES:  CT as above.   DISCHARGE MEDICATIONS:  1. Lantus 15 units subcutaneously daily.  2. Protonix 40 mg p.o. b.i.d.  3. Pancrease 2 capsules 3 times daily.  4. Abilify 5 mg every night.  5. Lamictal 25 mg b.i.d.  6. Vicodin 5/500 one p.o. q.6h. p.r.n.  7. Humalog sliding scale:  Glucose less than 100 0 units Humalog, 101      to 150 2 units Humalog, 151 to 200 3 units Humalog, 201 to 250 5      units Humalog, 251 to 300 7 units Humalog, 301 to 350 9 units      Humalog, 351 to 400 11 units Humalog.  8. Humalog at night:  Blood sugar 201 to 250 2 units Humalog, 251 to      300 3 units Humalog, 301 to 350 4 units Humalog, 351 to 400 5 units      Humalog.   HOSPITAL LABORATORIES:  BMET on December 22, 2006 was normal. Amylase  normal on December 19, 2006 and lipase normal on December 19, 2006.  Amylase 662 on December 15, 2006; lipase 616 on December 15, 2006. Lipid  profile on December 16, 2006:  Cholesterol 346, triglycerides 907.   CONSULTATIONS:  Psychiatry recommended titrating Lamictal to 70 mg  b.i.d. The patient is also on Abilify.   Consultations by critical care. Patient diagnosed with SIRS.   HOSPITAL COURSE:  The patient admitted to the hospitalist service with  new-onset diabetes,  severe pancreatitis. Outpatient triglycerides  reported at greater than 800,000 and cholesterol greater than 800. The  patient was admitted to the hospital and treated aggressively with IV  fluids, n.p.o. The patient's condition worsened and was seen by critical  care. The patient was transferred to the step-down unit on December 16, 2006 for supportive care. The patient improved thereafter, was able to  tolerate a diet at the time of discharge.   New-onset diabetes:  The patient presented with DKA; was treated with  insulin drip, IV fluids. The patient will be discharged on Lantus and  meal-time insulin.   Bipolar disorder:  The patient was seen in consultation by Dr.  Jeanie Sewer. See that note for details. He will be discharged on the  medications listed above with thoughts of titrating the Lamictal to 75  mg b.i.d.   Pain control:  The patient was doing well at the time  of discharge. We  will have Vicodin at home as needed.   DISCHARGE FOLLOWUP:  The patient will see Dr. Tawanna Cooler December 30, 2006 at 9:30  in the morning. He should make an appointment with Dr. Jennelle Human as well.   CONDITION ON DISCHARGE:  Improved.      Bruce Rexene Edison Swords, MD  Electronically Signed     BHS/MEDQ  D:  12/25/2006  T:  12/25/2006  Job:  [id number]

## 2011-03-14 NOTE — Assessment & Plan Note (Signed)
[facility]                                 ON-CALL NOTE   BEAUREGARD, JARRELLS                 MRN:          [id number]  DATE:12/12/2006                            DOB:          1976-09-29    CALLER:  His mother, Dondra Spry.  DATE OF BIRTH: 20-Oct-1976  PRIMARY CARE PHYSICIAN: Dr. Tinnie Gens A. Todd.  DATE: December 12, 2006.   SUBJECTIVE:  A 35 year old male was having routine testing for Depakote  level on Friday evening and he mentioned to his psychiatrist that he was  thirsty and urinating more frequently and so a blood sugar was checked.  His fasting blood sugar results had come back to be 250. His  psychiatrist instructed him to go to the emergency room.  While in the  emergency room at [facility] his blood sugar was 411. They gave him  some doses of insulin, diagnosed him with diabetes type 2 and gave him  Glimepiride 1 mg daily. Currently he feels fine but they have no way to  measure blood sugars at home and had some questions about eating habits  with diabetes and what to do next.   ASSESSMENT AND PLAN:  Counseled for about 15 minutes about diabetes.  Called in a order for a Glucometer as well as test strips and lancets to  [facility]. They were instructed on normal blood sugar levels and  how to check them. They were also instructed on how to manage  hypoglycemia. They will contact Dr. Tawanna Cooler first thing Monday morning for  an appointment for further determination of whether this is definitely  type 2 or type 1 diabetes. In addition, Dr. Tawanna Cooler will likely set them up  with diabetes and nutrition counseling. At this point I did instruct  them that if he starts feeling ill, to check a blood sugar and if it is  above 300 or consistently hypoglycemic, to return to the emergency room.     Kerby Nora, MD  Electronically Signed    AB/MedQ  DD: 12/12/2006  DT: 12/12/2006  Job #: [id number]

## 2011-03-14 NOTE — Consult Note (Signed)
[name], Short        ACCOUNT NO.:  [id number]   MEDICAL RECORD NO.:  [id number]          PATIENT TYPE:  INP   LOCATION:  1337                         FACILITY:  [facility]   PHYSICIAN:  Antonietta Breach, M.D.  DATE OF BIRTH:  1976/03/09   DATE OF CONSULTATION:  12/24/2006  DATE OF DISCHARGE:  12/25/2006                                 CONSULTATION   Cody Short continues with a normal mood.  His energy is now at  85%.  He has no thoughts of harming himself.  He has no thoughts of  harming others.  He has no delusions or hallucinations.  He has no  racing thoughts.  His interests and hope are within normal limits.   He is not having any adverse Lamictal effects.   LABORATORY DATA:  The WBC on February 26 was 7.9, hemoglobin 12.  Platelet count had no results.  The results tab delta check noted BUN 6,  creatinine 0.74, calcium 8.4.   VITAL SIGNS:  Temperature 99.0, pulse 74, respiration 20, blood pressure  141/90, O2 saturation room air 97%.   MENTAL STATUS EXAM:  Cody Short is alert.  He is oriented to all  spheres.  Thought process logical, coherent, goal-directed.  His speech  involves normal rate and prosody.  His concentration is within normal  limits.  He is well groomed and socially appropriate.  Thought process  logical, coherent, goal-directed.  No looseness of associations.  Thought content:  No thoughts of harming himself.  No thoughts of  harming others.  No delusions, no hallucinations.  Judgment is within  normal limits.  He is oriented to all spheres.  His memory is within  normal limits.   ASSESSMENT:  1. 293.83  Mood disorder not otherwise specified.  2. 296.80  Bipolar disorder not otherwise specified.   With the patient's request and permission, the undersigned called his  private psychiatrist, Dr. Meredith Staggers.  The undersigned and Dr. Jennelle Human  discussed the patient's history of severe manic psychotic state and the  fact that Lamictal  would require a number of weeks to become effective  in mood episode prevention.  Dr. Jennelle Human and the undersigned concurred  that starting an antipsychotic such as Abilify would be indicated for  prevention, at least until Lamictal had reached the dosage in the amount  of time typical for adequate prevention.   At the patient's request, his family joined the session to facilitate  support and education.  The undersigned provided education on the  various treatments for mood conditions including manic psychosis as well  as prevention strategies.   The indications, alternatives and adverse effects of Abilify were  discussed with the patient, including the risk of hyperglycemia and a  nonreversible movement disorder.  The patient understands and would like  to start Abilify to improve prevention of manic psychosis.   RECOMMENDATIONS:  1. Start Abilify 5 mg p.o. at bedtime.  2. Increase Lamictal to 50 mg p.o. daily with the goal of continuing      to titrate Lamictal slowly at the rate of 25-50 mg per week,      reaching 150-200 mg  per day within the first month of Lamictal      treatment.   RECOMMENDATIONS:  1. Cody Short agrees to call his emergency number immediately      for any signs or symptoms of returning mood episodes or other      emergency symptoms such as thoughts of harming himself, thoughts of      harming others or racing thoughts as well as any side effects of      medication.  2. Would ask the case manager to set Cody Short up with a follow-      up appointment with Dr. Jennelle Human within the first week of discharge.      Antonietta Breach, M.D.  Electronically Signed     JW/MEDQ  D:  12/26/2006  T:  12/26/2006  Job:  [id number]

## 2011-04-22 ENCOUNTER — Telehealth: Payer: Self-pay

## 2011-04-22 NOTE — Telephone Encounter (Signed)
Pt calling to request more samples of crestor, one or two samples-pls call [phone]

## 2011-04-24 NOTE — Telephone Encounter (Signed)
Called spoke with pt advised #28 tablets of Crestor 10mg  tablets left at front desk for pick-up.

## 2011-05-12 ENCOUNTER — Other Ambulatory Visit (INDEPENDENT_AMBULATORY_CARE_PROVIDER_SITE_OTHER): Payer: BC Managed Care – PPO | Admitting: *Deleted

## 2011-05-12 ENCOUNTER — Other Ambulatory Visit: Payer: Self-pay | Admitting: Family Medicine

## 2011-05-12 DIAGNOSIS — E781 Pure hyperglyceridemia: Secondary | ICD-10-CM

## 2011-05-12 LAB — LIPID PANEL
HDL: 46 mg/dL (ref >39.00)
Total CHOL/HDL Ratio: 3
VLDL: 56.8 mg/dL — ABNORMAL HIGH (ref 0.0–40.0)

## 2011-05-12 LAB — HEPATIC FUNCTION PANEL
Alkaline Phosphatase: 69 U/L (ref 39–117)
Bilirubin, Direct: 0 mg/dL (ref 0.0–0.3)
Total Bilirubin: 0.5 mg/dL (ref 0.3–1.2)
Total Protein: 8.2 g/dL (ref 6.0–8.3)

## 2011-05-19 ENCOUNTER — Ambulatory Visit (INDEPENDENT_AMBULATORY_CARE_PROVIDER_SITE_OTHER): Payer: BC Managed Care – PPO

## 2011-05-19 VITALS — Wt 220.0 lb

## 2011-05-19 DIAGNOSIS — E781 Pure hyperglyceridemia: Secondary | ICD-10-CM

## 2011-05-19 NOTE — Patient Instructions (Addendum)
Start taking fish oil 2 capsules every day again  Continue Crestor 10mg  daily  Try to go back to your old diet where you were not eating fried foods every day.    Continue to exercise as much as possible.  Encourage your family to walk with you outside.   Recheck labs in 3 months.

## 2011-05-23 NOTE — Progress Notes (Signed)
Cody Short is a 35 yo M who [facility] for follow-up evaluation.  He was placed on Crestor 10 mg as well as fish oil 2 gm daily.  He is tolerating the Crestor with no issues but did stop taking the fish oil approximately 2 weeks ago due to fishy aftertaste.  He has no complaints of muscle pains, aches, chest pain or SOB.     Since last visit, pt did have a manic episode.  His Seroquel (which had been previously reduced)  Was increased to 600mg  daily.  He was also started on haldol.  Pt reports this is making him very tired but he sees his psychiatrist tomorrow.  He does not currently smoke or drink alcohol.  He quit both 5-6 years ago.    Unfortunately pt has regressed back to his old diet.  He has increased fried foods- says he feels like his Seroquel makes him crave fried foods. He is eating fish sandwiches and french fries from [employer] and decreased healthier choices like oatmeal.   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    Current Outpatient Prescriptions on File Prior to Visit  Medication Sig Dispense Refill  . lithium 300 MG capsule Take by mouth. 3 every morning , 3 every evening      . metFORMIN (GLUCOPHAGE) 500 MG tablet Take by mouth. 1/2 every morning       . QUEtiapine (SEROQUEL XR) 400 MG 24 hr tablet 1 1/2 tablets at bedtime      . rosuvastatin (CRESTOR) 10 MG tablet Take 1 tablet (10 mg total) by mouth daily.  30 tablet  0  . fish oil-omega-3 fatty acids 1000 MG capsule Take 2 capsules (2 g total) by mouth daily.  60 capsule  0  . glucose blood (ONE TOUCH TEST STRIPS) test strip 1 each by Other route daily. Use as instructed       . ONE TOUCH LANCETS MISC by Does not apply route daily.        Marland Kitchen sulfamethoxazole-trimethoprim (BACTRIM DS,SEPTRA DS) 800-160 MG per tablet Take 1 tablet by mouth 2 (two) times daily.  100 tablet  3    Allergies  Allergen Reactions  . Divalproex Sodium   . Methylphenidate Hcl   . Oxycodone Hcl      REACTION: hallucinations  . Paroxetine

## 2011-05-23 NOTE — Assessment & Plan Note (Addendum)
TC-139 (goal<200), TG- 284 (goal<150), HDL- 46 (goal>40), and LDL- 72.5 (goal<70).  LFTs are WNL.  Pt's cholesterol relatively stable but TG have increased since last visit.  This is likely due to diet changes, lack of fish oil over past 2 weeks and possibly increase in Seroquel.  Will continue Crestor and restart fish oil.  Encouraged pt to continue to watch his diet and limit fried foods/french fries to only 1 day per week.  Will f/u in 3 months.

## 2011-07-09 ENCOUNTER — Telehealth: Payer: Self-pay | Admitting: Pharmacist

## 2011-07-09 NOTE — Telephone Encounter (Signed)
LM for pt.  Left samples at the front desk.

## 2011-07-09 NOTE — Telephone Encounter (Signed)
Pt is out of Crestor 10mg ,  He was told to call you when he needed samples. He has 6 day supply left.  Please call him if he can pick up.

## 2011-07-14 ENCOUNTER — Telehealth: Payer: Self-pay | Admitting: *Deleted

## 2011-07-14 ENCOUNTER — Inpatient Hospital Stay (INDEPENDENT_AMBULATORY_CARE_PROVIDER_SITE_OTHER)
Admission: RE | Admit: 2011-07-14 | Discharge: 2011-07-14 | Disposition: A | Discharge status: Home / Self Care | Payer: BC Managed Care – PPO | Source: Ambulatory Visit | Attending: Family Medicine | Admitting: Family Medicine

## 2011-07-14 DIAGNOSIS — I10 Essential (primary) hypertension: Secondary | ICD-10-CM

## 2011-07-14 DIAGNOSIS — R0989 Other specified symptoms and signs involving the circulatory and respiratory systems: Secondary | ICD-10-CM

## 2011-07-14 DIAGNOSIS — R079 Chest pain, unspecified: Secondary | ICD-10-CM

## 2011-07-14 DIAGNOSIS — R0609 Other forms of dyspnea: Secondary | ICD-10-CM

## 2011-07-14 LAB — COMPREHENSIVE METABOLIC PANEL
ALT: 39 U/L (ref 0–53)
Alkaline Phosphatase: 77 U/L (ref 39–117)
BUN: 7 mg/dL (ref 6–23)
CO2: 25 mEq/L (ref 19–32)
Calcium: 10.3 mg/dL (ref 8.4–10.5)
GFR calc Af Amer: >60 mL/min (ref >60)
GFR calc non Af Amer: >60 mL/min (ref >60)
Glucose, Bld: 93 mg/dL (ref 70–99)
Potassium: 3.9 mEq/L (ref 3.5–5.1)
Sodium: 140 mEq/L (ref 135–145)

## 2011-07-14 LAB — CBC
HCT: 44.1 % (ref 39.0–52.0)
Hemoglobin: 15.1 g/dL (ref 13.0–17.0)
MCH: 30.8 pg (ref 26.0–34.0)
RBC: 4.9 MIL/uL (ref 4.22–5.81)

## 2011-07-14 LAB — DIFFERENTIAL
Basophils Relative: 1 % (ref 0–1)
Lymphocytes Relative: 22 % (ref 12–46)
Monocytes Relative: 8 % (ref 3–12)
Neutro Abs: 6.8 10*3/uL (ref 1.7–7.7)
Neutrophils Relative %: 66 % (ref 43–77)

## 2011-07-14 NOTE — Telephone Encounter (Addendum)
Pt is having severe abd pain with nausea.  History of pancreatitis.  Advised to go to the ER ASAP to rule out acute abdomen.  ? Fever  Mom is not sure of all his symptoms, but he did tell her he felt swollen in his entire abdomen.

## 2011-07-15 NOTE — Telephone Encounter (Signed)
Pt called and went to urgent re: abn pain. Pt had lab work done there and is req to get the results. Pls call.

## 2011-08-05 LAB — BASIC METABOLIC PANEL
BUN: 7
Chloride: 101
Creatinine, Ser: 0.83

## 2011-08-05 LAB — CBC
MCHC: 35
MCV: 85.5
Platelets: 234
WBC: 8.1

## 2011-08-05 LAB — DIFFERENTIAL
Basophils Relative: 1
Eosinophils Absolute: 0.2
Lymphs Abs: 2.8
Neutrophils Relative %: 50

## 2011-08-06 ENCOUNTER — Telehealth: Payer: Self-pay | Admitting: Pharmacist

## 2011-08-06 NOTE — Telephone Encounter (Signed)
Pt calling wanting to know if he could get more crestor samples. Please return pt call to discuss further.

## 2011-08-06 NOTE — Telephone Encounter (Signed)
LM for pt.  Crestor 10mg  #28 left at front desk.

## 2011-08-18 ENCOUNTER — Other Ambulatory Visit: Payer: BC Managed Care – PPO | Admitting: *Deleted

## 2011-08-18 ENCOUNTER — Other Ambulatory Visit: Payer: Self-pay | Admitting: Family Medicine

## 2011-08-18 DIAGNOSIS — E781 Pure hyperglyceridemia: Secondary | ICD-10-CM

## 2011-08-18 LAB — LDL CHOLESTEROL, DIRECT: Direct LDL: 47.1 mg/dL

## 2011-08-18 LAB — HEPATIC FUNCTION PANEL
ALT: 53 U/L (ref 0–53)
AST: 33 U/L (ref 0–37)
Albumin: 4.4 g/dL (ref 3.5–5.2)

## 2011-08-18 LAB — LIPID PANEL
Cholesterol: 123 mg/dL (ref 0–200)
Triglycerides: 384 mg/dL — ABNORMAL HIGH (ref 0.0–149.0)

## 2011-08-25 ENCOUNTER — Ambulatory Visit (INDEPENDENT_AMBULATORY_CARE_PROVIDER_SITE_OTHER): Payer: BC Managed Care – PPO

## 2011-08-25 VITALS — BP 132/84 | HR 72 | Wt 224.0 lb

## 2011-08-25 DIAGNOSIS — E781 Pure hyperglyceridemia: Secondary | ICD-10-CM

## 2011-08-25 MED ORDER — ROSUVASTATIN CALCIUM 10 MG PO TABS: 5.0000 mg | ORAL_TABLET | Freq: Every day | ORAL | Status: DC

## 2011-08-25 MED ORDER — FENOFIBRATE 145 MG PO TABS: 145.0000 mg | ORAL_TABLET | Freq: Every day | ORAL | Status: DC

## 2011-08-25 NOTE — Progress Notes (Signed)
Cody Short is a 35 yo male who presents to [facility] for 3 month follow-up evaluation.  He was placed on Crestor 10 mg as well as fish oil 2 gm daily.  He is tolerating both medications with no issues but did stop taking the fish oil for about 2 weeks because he ran out.  He has no complaints of muscle pains, aches, chest pain or SOB.     Patient does not report any changes to any of his other medications.  He does not currently smoke or drink alcohol.  He quit both 5-6 years ago.    Patient has continued his unhealthy eating habits that he had regressed to at the last visit.  He is eating fast food at least once a day, sometimes twice a day. He says that when he gets off of work late, fast food is the only thing that's open. For breakfast, he reports eating eggs and bacon or sausage. Lunch and sometimes dinner are fast food. When dinner is prepared at home, it consists of things like pasta, roasts, pizzas, and salad. Patient also enjoys sweets on occasion and drinks several sodas and Icees while he is at work because they are free.   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 He was walking on the treadmill for 15-30 minutes every day for about 2 weeks, but then he stopped secondary to time constraints.  Current Outpatient Prescriptions on File Prior to Visit  Medication Sig Dispense Refill  . fish oil-omega-3 fatty acids 1000 MG capsule Take 2 capsules (2 g total) by mouth daily.  60 capsule  0  . glucose blood (ONE TOUCH TEST STRIPS) test strip 1 each by Other route daily. Use as instructed       . haloperidol (HALDOL) 0.5 MG tablet Take 0.5 mg by mouth at bedtime.        Marland Kitchen lithium 300 MG capsule Take by mouth. 3 every morning , 3 every evening      . metFORMIN (GLUCOPHAGE) 500 MG tablet Take by mouth. 1/2 every morning       . ONE TOUCH LANCETS MISC by Does not apply route daily.        . QUEtiapine (SEROQUEL XR) 400 MG 24 hr tablet 1 1/2 tablets at  bedtime      . rosuvastatin (CRESTOR) 10 MG tablet Take 1 tablet (10 mg total) by mouth daily.  30 tablet  0  . sulfamethoxazole-trimethoprim (BACTRIM DS,SEPTRA DS) 800-160 MG per tablet Take 1 tablet by mouth 2 (two) times daily.  100 tablet  3    Allergies  Allergen Reactions  . Divalproex Sodium   . Methylphenidate Hcl   . Oxycodone Hcl     REACTION: hallucinations  . Paroxetine

## 2011-08-25 NOTE — Assessment & Plan Note (Addendum)
TC- 123 (goal<200), TG- 384 (goal<150), HDL- 44 (goal>40), and LDL- 47 (goal<70).  LFTs are WNL.  Pt's cholesterol is somewhat stable but TG have increased since last visit.  This is likely due to diet changes, lack of physical activity, and Seroquel use. Pt's LDL continues to trend down.  To prevent further decrease, will change Crestor to 5 mg daily.  Will add Tricor to help decrease TG.  Pt was given samples of Tricor.  If effective, will provide prescription for generic fenofibrate. Provided patient with the instructions below. Will follow up in 6-8 weeks.  1) Start taking Tricor 145 mg daily. 2) Decrease Crestor to 5 mg daily (cut remaining pills in half until finished then start the 5 mg tablets given at the office). 3) Try to pick healthier fast food options when you eat it- salads, chili, grilled items, etc. 4) Increase your water intake throughout the day. Consider getting a big water bottle to keep with you at work so you can drink water instead of sodas and Icees. Use Crystal Light packets to add flavor. 5) Incorporate exercise back into your regular routine- walking on treadmill 15-30 minutes 4-5 days per week.

## 2011-08-25 NOTE — Patient Instructions (Signed)
1) Start taking Tricor 145 mg daily. 2) Decrease Crestor to 5 mg daily (cut remaining pills in half until finished then start the 5 mg tablets given at the office). 3) Try to pick healthier fast food options when you eat it- salads, chili, grilled items, etc. 4) Increase your water intake throughout the day. Consider getting a big water bottle to keep with you at work so you can drink water instead of sodas and Icees. Use Crystal Light packets to add flavor. 5) Incorporate exercise back into your regular routine- walking on treadmill 15-30 minutes 4-5 days per week.  Follow up with lipid clinic in 6 weeks: Monday, January 7th at 2:30PM FASTING lab work done at office: Wednesday, January 2nd at 10:20AM

## 2011-09-22 ENCOUNTER — Other Ambulatory Visit: Payer: Self-pay | Admitting: Family Medicine

## 2011-09-23 ENCOUNTER — Telehealth: Payer: Self-pay | Admitting: Family Medicine

## 2011-09-23 MED ORDER — METFORMIN HCL 500 MG PO TABS: ORAL_TABLET | ORAL | Status: DC

## 2011-09-23 NOTE — Telephone Encounter (Signed)
Pt is scheduled for a cpe on 11/27/11 but will be out of metformin. Pt requesting a refill   [facility]

## 2011-09-24 ENCOUNTER — Telehealth: Payer: Self-pay | Admitting: Family Medicine

## 2011-09-24 NOTE — Telephone Encounter (Signed)
rx sent yesterday

## 2011-09-24 NOTE — Telephone Encounter (Signed)
Refill metformin until cpx----January 31st. Thanks.     [facility]

## 2011-10-01 ENCOUNTER — Other Ambulatory Visit: Payer: Self-pay | Admitting: Pharmacist

## 2011-10-01 MED ORDER — FENOFIBRATE 145 MG PO TABS: 145.0000 mg | ORAL_TABLET | Freq: Every day | ORAL | Status: DC

## 2011-10-16 ENCOUNTER — Telehealth: Payer: Self-pay | Admitting: Pharmacist

## 2011-10-16 NOTE — Telephone Encounter (Signed)
LMOM that 10mg s samples are at desk, cut in 1/2 for 5mg s dose and bring ID to pick up samples.

## 2011-10-16 NOTE — Telephone Encounter (Signed)
New msg °Pt wants samples of crestor please call if have any °

## 2011-10-29 ENCOUNTER — Other Ambulatory Visit: Payer: Self-pay | Admitting: Family Medicine

## 2011-10-29 ENCOUNTER — Other Ambulatory Visit (INDEPENDENT_AMBULATORY_CARE_PROVIDER_SITE_OTHER): Payer: BC Managed Care – PPO | Admitting: *Deleted

## 2011-10-29 DIAGNOSIS — E781 Pure hyperglyceridemia: Secondary | ICD-10-CM

## 2011-10-29 LAB — LIPID PANEL
Cholesterol: 154 mg/dL (ref 0–200)
HDL: 36.2 mg/dL — ABNORMAL LOW (ref >39.00)
VLDL: 83.8 mg/dL — ABNORMAL HIGH (ref 0.0–40.0)

## 2011-10-29 LAB — HEPATIC FUNCTION PANEL: Albumin: 4.5 g/dL (ref 3.5–5.2)

## 2011-11-03 ENCOUNTER — Ambulatory Visit (INDEPENDENT_AMBULATORY_CARE_PROVIDER_SITE_OTHER): Payer: BC Managed Care – PPO

## 2011-11-03 VITALS — BP 133/78 | Wt 223.0 lb

## 2011-11-03 DIAGNOSIS — E781 Pure hyperglyceridemia: Secondary | ICD-10-CM

## 2011-11-03 NOTE — Assessment & Plan Note (Signed)
TC 154 (goal <200), TG 419 (goal<150), HDL 36.2 (goal >40), LDL 74.1 (goal <70).  LFTs are WNL.  Cholesterol and triglycerides have increased since last visit.  His diet high in fat and lack of exercise are most likely the cause of this increase.  An increase in his Seroquel dose could also contribute to poor lipid control.    1) Continue all medications. 2) Decrease soda intake to no more than 1 per day.  Encourage to drink water more regularly with use of a water bottle that can be refilled. 3) Limit fried food choices to 2 times per week.  Counsel on healthy options at [employer] and provide Nutrition in the [employer].  Choose grilled sandwich and side salad and fruit cup to replace french fries. 4) Encourage to get back on the treadmill 3 times per week for at least 15 minutes at a pace faster than a normal walk.  Continue yard work as another sources of physical activity. 5) Obtain lipid panel in 3 months and review at follow up appointment.

## 2011-11-03 NOTE — Progress Notes (Signed)
HPI:  VM was seen today for a follow up appointment in the lipid clinic to review current lipid panel.  Since last visit, he has made an effort to cut down on fried foods and increase water, but during the holidays he has started eating 2 fried chicken sandwiches with french fries and 5-6 regular sodas per day.  Has not routinely exercised since last appointment.  He does yardwork, and has a treadmill at home.  Current cholesterol medications include Tricor 145 mg daily, fish oil 2 g daily, and crestor 5 mg daily (decreased from 10 mg at last visit).  Changes to medications include lithium 2 tablets in the morning and 4 tablets in the evening (previously 3 tabs in morning and 3 tabs in evening - caused hands to shake), seroquel 2 tablets at bedtime (previously 1.5 tabs at bedtime), and bactrim has been discontinued. He is compliant with and tolerating all medications.     Current Outpatient Prescriptions  Medication Sig Dispense Refill  . fenofibrate (TRICOR) 145 MG tablet Take 1 tablet (145 mg total) by mouth daily.  30 tablet  3  . fish oil-omega-3 fatty acids 1000 MG capsule Take 2 capsules (2 g total) by mouth daily.  60 capsule  0  . haloperidol (HALDOL) 0.5 MG tablet Take 0.5 mg by mouth at bedtime.        Marland Kitchen lithium 300 MG capsule Take by mouth. 2 every morning , 4 every evening      . metFORMIN (GLUCOPHAGE) 500 MG tablet Half tab every morning  45 tablet  3  . QUEtiapine (SEROQUEL XR) 400 MG 24 hr tablet 2 tablets at bedtime      . rosuvastatin (CRESTOR) 10 MG tablet Take 0.5 tablets (5 mg total) by mouth daily.  30 tablet  0  . glucose blood (ONE TOUCH TEST STRIPS) test strip 1 each by Other route daily. Use as instructed       . ONE TOUCH LANCETS MISC by Does not apply route daily.        Marland Kitchen sulfamethoxazole-trimethoprim (BACTRIM DS,SEPTRA DS) 800-160 MG per tablet Take 1 tablet by mouth 2 (two) times daily.  100 tablet  3    Allergies  Allergen Reactions  . Divalproex Sodium   .  Methylphenidate Hcl   . Oxycodone Hcl     REACTION: hallucinations  . Paroxetine

## 2011-11-03 NOTE — Patient Instructions (Addendum)
Continue same medications.   Start exercising on the treadmill for at least 15 minutes 3 days per week.   Try to cut down on the fried foods.  Limit yourself to 2 servings of fried foods per week  Set a goal to drink more water and only drink 1 soft drink per day.   Recheck labs in 3 months.

## 2011-11-20 ENCOUNTER — Other Ambulatory Visit (INDEPENDENT_AMBULATORY_CARE_PROVIDER_SITE_OTHER): Payer: BC Managed Care – PPO

## 2011-11-20 DIAGNOSIS — E781 Pure hyperglyceridemia: Secondary | ICD-10-CM

## 2011-11-20 DIAGNOSIS — Z0000 Encounter for general adult medical examination without abnormal findings: Secondary | ICD-10-CM

## 2011-11-20 LAB — CBC WITH DIFFERENTIAL/PLATELET
Basophils Absolute: 0.1 10*3/uL (ref 0.0–0.1)
Eosinophils Absolute: 0.3 10*3/uL (ref 0.0–0.7)
HCT: 42 % (ref 39.0–52.0)
Lymphs Abs: 2.7 10*3/uL (ref 0.7–4.0)
MCHC: 33.8 g/dL (ref 30.0–36.0)
Monocytes Relative: 10.2 % (ref 3.0–12.0)
Platelets: 213 10*3/uL (ref 150.0–400.0)
RDW: 14.1 % (ref 11.5–14.6)

## 2011-11-20 LAB — HEPATIC FUNCTION PANEL
AST: 34 U/L (ref 0–37)
Total Bilirubin: 0.6 mg/dL (ref 0.3–1.2)

## 2011-11-20 LAB — LIPID PANEL
Cholesterol: 122 mg/dL (ref 0–200)
HDL: 32.4 mg/dL — ABNORMAL LOW (ref >39.00)
Triglycerides: 302 mg/dL — ABNORMAL HIGH (ref 0.0–149.0)
VLDL: 60.4 mg/dL — ABNORMAL HIGH (ref 0.0–40.0)

## 2011-11-20 LAB — POCT URINALYSIS DIPSTICK
Bilirubin, UA: NEGATIVE
Blood, UA: NEGATIVE
Glucose, UA: NEGATIVE
Ketones, UA: NEGATIVE
Spec Grav, UA: 1.025

## 2011-11-20 LAB — TSH: TSH: 5.15 u[IU]/mL (ref 0.35–5.50)

## 2011-11-20 LAB — BASIC METABOLIC PANEL
BUN: 13 mg/dL (ref 6–23)
Calcium: 9.7 mg/dL (ref 8.4–10.5)
GFR: 68.71 mL/min (ref >60.00)
Glucose, Bld: 115 mg/dL — ABNORMAL HIGH (ref 70–99)
Potassium: 4.2 mEq/L (ref 3.5–5.1)

## 2011-11-20 LAB — MICROALBUMIN / CREATININE URINE RATIO: Creatinine,U: 230.6 mg/dL

## 2011-11-20 LAB — HEMOGLOBIN A1C: Hgb A1c MFr Bld: 6.8 % — ABNORMAL HIGH (ref 4.6–6.5)

## 2011-11-21 LAB — LDL CHOLESTEROL, DIRECT: Direct LDL: 66.2 mg/dL

## 2011-11-27 ENCOUNTER — Encounter: Payer: Self-pay | Admitting: Family Medicine

## 2011-11-27 ENCOUNTER — Ambulatory Visit (INDEPENDENT_AMBULATORY_CARE_PROVIDER_SITE_OTHER): Payer: BC Managed Care – PPO | Admitting: Family Medicine

## 2011-11-27 DIAGNOSIS — E119 Type 2 diabetes mellitus without complications: Secondary | ICD-10-CM

## 2011-11-27 DIAGNOSIS — L702 Acne varioliformis: Secondary | ICD-10-CM

## 2011-11-27 DIAGNOSIS — Z23 Encounter for immunization: Secondary | ICD-10-CM

## 2011-11-27 DIAGNOSIS — Z87891 Personal history of nicotine dependence: Secondary | ICD-10-CM

## 2011-11-27 DIAGNOSIS — E781 Pure hyperglyceridemia: Secondary | ICD-10-CM

## 2011-11-27 MED ORDER — SULFAMETHOXAZOLE-TRIMETHOPRIM 800-160 MG PO TABS: 1.0000 | ORAL_TABLET | Freq: Two times a day (BID) | ORAL | Status: DC

## 2011-11-27 MED ORDER — METFORMIN HCL 500 MG PO TABS: ORAL_TABLET | ORAL | Status: DC

## 2011-11-27 MED ORDER — ROSUVASTATIN CALCIUM 10 MG PO TABS: 5.0000 mg | ORAL_TABLET | Freq: Every day | ORAL | Status: DC

## 2011-11-27 MED ORDER — FENOFIBRATE 145 MG PO TABS: 145.0000 mg | ORAL_TABLET | Freq: Every day | ORAL | Status: DC

## 2011-11-27 NOTE — Progress Notes (Signed)
  Subjective:    Patient ID: Cody Short, male    DOB: [birth year], 36 y.o.   MRN: [id number]  HPI Cody Short is a 36 year old single male nonsmoker,,,,,,,,,, he quit 8 years ago,,, who comes in today for general medical examination because of a history of diabetes and hyperlipidemia  His diabetes is treated with metformin 250 mg daily fasting blood sugar 1:15 A1c 6.8%  His hyperlipidemia is treated with Crestor 5 mg daily his lipids are at goal with an LDL of 66,,,,,,,,,, total cholesterol 122 triglycerides down from 419-300  He continues to see his psychiatrist Dr. Haywood Lasso on a regular basis. He takes Haldol 0.5 mg each bedtime, lithium 2 tabs in the morning and 4 tabs in the evening, Seroquel 600 mg each bedtime  He also takes Septra DS twice a day for flares up and his acne. He's been off the Septra now for a couple months but recently his acne has started to flare up.  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    Review of Systems  Constitutional: Negative.   HENT: Negative.   Eyes: Negative.   Respiratory: Negative.   Cardiovascular: Negative.   Gastrointestinal: Negative.   Genitourinary: Negative.   Musculoskeletal: Negative.   Skin: Negative.   Neurological: Negative.   Hematological: Negative.   Psychiatric/Behavioral: Negative.        Objective:   Physical Exam  Constitutional: He is oriented to person, place, and time. He appears well-developed and well-nourished.  HENT:  Head: Normocephalic and atraumatic.  Right Ear: External ear normal.  Left Ear: External ear normal.  Nose: Nose normal.  Mouth/Throat: Oropharynx is clear and moist.  Eyes: Conjunctivae and EOM are normal. Pupils are equal, round, and reactive to light.  Neck: Normal range of motion. Neck supple. No JVD present. No tracheal deviation present. No thyromegaly present.  Cardiovascular: Normal rate, regular rhythm, normal heart sounds and intact distal pulses.  Exam reveals no  gallop and no friction rub.   No murmur heard. Pulmonary/Chest: Effort normal and breath sounds normal. No stridor. No respiratory distress. He has no wheezes. He has no rales. He exhibits no tenderness.  Abdominal: Soft. Bowel sounds are normal. He exhibits no distension and no mass. There is no tenderness. There is no rebound and no guarding.  Genitourinary: Penis normal. No penile tenderness.  Musculoskeletal: Normal range of motion. He exhibits no edema and no tenderness.  Lymphadenopathy:    He has no cervical adenopathy.  Neurological: He is alert and oriented to person, place, and time. He has normal reflexes. No cranial nerve deficit. He exhibits normal muscle tone.  Skin: Skin is warm and dry. No rash noted. No erythema. No pallor.       2+ pustular acne  Psychiatric: He has a normal mood and affect. His behavior is normal. Judgment and thought content normal.          Assessment & Plan:  Healthy male  Diabetes type 2 continue current therapy  Hypertriglyceridemia continue current therapy  Bipolar depression continue current medications and followup by psychiatry  Acne restart Septra  Return in one year or sooner if any problems

## 2011-11-27 NOTE — Patient Instructions (Signed)
Continue your current medications  Walk 30 minutes daily  Return in one year or sooner if any problems  Restart the Septra one twice a day for your acne

## 2012-03-03 ENCOUNTER — Other Ambulatory Visit: Payer: Self-pay | Admitting: Cardiology

## 2012-03-03 DIAGNOSIS — Z79899 Other long term (current) drug therapy: Secondary | ICD-10-CM

## 2012-03-03 DIAGNOSIS — E781 Pure hyperglyceridemia: Secondary | ICD-10-CM

## 2012-03-08 ENCOUNTER — Other Ambulatory Visit (INDEPENDENT_AMBULATORY_CARE_PROVIDER_SITE_OTHER): Payer: BC Managed Care – PPO

## 2012-03-08 DIAGNOSIS — Z79899 Other long term (current) drug therapy: Secondary | ICD-10-CM

## 2012-03-08 DIAGNOSIS — E781 Pure hyperglyceridemia: Secondary | ICD-10-CM

## 2012-03-08 LAB — HEPATIC FUNCTION PANEL
AST: 38 U/L — ABNORMAL HIGH (ref 0–37)
Alkaline Phosphatase: 45 U/L (ref 39–117)
Total Bilirubin: 0.4 mg/dL (ref 0.3–1.2)

## 2012-03-08 LAB — LIPID PANEL
HDL: 34.3 mg/dL — ABNORMAL LOW (ref >39.00)
Total CHOL/HDL Ratio: 4
VLDL: 59.2 mg/dL — ABNORMAL HIGH (ref 0.0–40.0)

## 2012-03-09 ENCOUNTER — Ambulatory Visit (INDEPENDENT_AMBULATORY_CARE_PROVIDER_SITE_OTHER): Payer: BC Managed Care – PPO | Admitting: Pharmacist

## 2012-03-09 VITALS — Wt 228.0 lb

## 2012-03-09 DIAGNOSIS — E781 Pure hyperglyceridemia: Secondary | ICD-10-CM

## 2012-03-09 MED ORDER — ROSUVASTATIN CALCIUM 10 MG PO TABS: ORAL_TABLET | ORAL | Status: DC

## 2012-03-09 NOTE — Patient Instructions (Addendum)
1. Start taking Crestor 5mg  everyday except 10mg  on Monday, Wednesday, and Friday 2. Increase your fish oil to 3 capsules per day. Can increase to 4 capsules if you want. 3. Start trying to use the treadmill more. Start at 1 day per week (15-30 minutes) and try to increase to 3 days per week. 4. Keep up the good work with limiting your soda! Continue trying to drink more water. Watch how much chocolate milk you drink. 5. Limit your fried foods to no more than 4 times per week. Look into the healthier sandwiches at [employer] or think about packing lunch or a sandwich to bring to work with you. If you want an extra "fried food," do an extra day of exercise (15-30 minutes)  Follow-up in 3 months  Sunday: Crestor 5 mg Monday: Crestor 10 mg Tuesday: Crestor 5 mg Wednesday Crestor 10mg  Thursday: Crestor 5 mg Friday: Crestor 10 mg Saturday: Crestor 5mg 

## 2012-03-09 NOTE — Progress Notes (Signed)
HPI: 35yoM patient presented to clinic for follow-up for lipid management.  He is currently taking Tricor 145mg , Crestor 5mg  daily (has been using 20mg  tablets - 1/2 tablet every other day because we did not have Crestor 10mg  samples) and fish oil 2g daily.  Patient seems to be tolerating medication well with no complaints of muscle aches or pains.   Diet Patient has made an attempt to reduce the amount of sodas consumer per day.  Reports a max of 2 sodas and drinking more bottles of water.  Reports drinking a lot of chocolate milk (1/2 gallon in a day).  Reports eating fast/fried food about 7x per week.  Has learned that there is a [employer] near by and is considering giving that a try.  When going to places like [employer] and [employer], will try to get a grilled chicken sandwich or something that is more healthy.  Discussed possibility of packing lunch for work but did not seem interested.   Breakfast: generally eats cereal or eggs and sausage, will some times get the ham and cheese biscuit from [employer].   Lunch: This generally changes a lot and will generally be when patient eats fast food because he old gets a lunch break.  Has been eating a lot more food from [employer] where he will eat skewer chicken and a side salad.  Has also been eating a lot more chicken wings and chicken fingers.  Patient reports that [employer] is one of his favorite places to eat.   Dinner: He has been eating more soups (vegetable, chicken or chili) that his mom is making for him at home.    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  Has a treadmill at home and has agreed to start trying to use this more.   Current Outpatient Prescriptions  Medication Sig Dispense Refill  . Acetylcysteine (N-ACETYL-L-CYSTEINE PO)  Take 2 capsules by mouth daily.      . fenofibrate (TRICOR) 145 MG tablet Take 1 tablet (145 mg total) by mouth daily.  100 tablet  3  . glucose blood (ONE TOUCH TEST STRIPS) test strip 1 each by Other route daily. Use as instructed       . haloperidol (HALDOL) 1 MG tablet Take 1 mg by mouth daily.      Marland Kitchen lithium 300 MG capsule Take by mouth. 2 every morning , 4 every evening      . metFORMIN (GLUCOPHAGE) 500 MG tablet Half tab every morning  45 tablet  3  . ONE TOUCH LANCETS MISC by Does not apply route daily.        . QUEtiapine (SEROQUEL) 300 MG tablet Take 300 mg by mouth at bedtime. 2 tabs qhs      . rosuvastatin (CRESTOR) 10 MG tablet Take 0.5 tablets (5 mg total) by mouth daily.  50 tablet  3  . sulfamethoxazole-trimethoprim (BACTRIM DS,SEPTRA DS) 800-160 MG per tablet Take 1 tablet by mouth 2 (two) times daily.  100 tablet  3     Patient seen with: Kristopher Glee, [facility] PharmD Candidate

## 2012-03-09 NOTE — Assessment & Plan Note (Signed)
Assessment/Plan:  TC 153 (goal<200), TG 296 (goal<150), HDL 34 (goal>40), LDL 86 (WJXB<14).  LFTs are WNL.  TGs have improved, but still above goal.  LDL has increased slightly after decreasing statin dose.    1.Will increase statin--Crestor 5mg  everyday except 10mg  on Monday, Wednesday, and Friday [Samples provided: Crestor 10mg  tablets (#28): LOT [id number], Exp 2/16] 2. Increase your fish oil to 3-4 capsules per day.  3. Start trying to use the treadmill more. Start at 1 day per week (15-30 minutes) and try to increase to 3 days per week. 4. Keep up the good work with limiting your soda! Continue trying to drink more water. Watch how much chocolate milk you drink. 5. Limit your fried foods to no more than 4 times per week. Look into the healthier sandwiches at [employer] or think about packing lunch or a sandwich to bring to work with you. If you want an extra "fried food," do an extra day of exercise (15-30 minutes) 6. Follow-up with fasting blood work and office visit in August.

## 2012-04-20 ENCOUNTER — Encounter: Payer: Self-pay | Admitting: Family Medicine

## 2012-04-20 ENCOUNTER — Telehealth: Payer: Self-pay | Admitting: Family Medicine

## 2012-04-20 ENCOUNTER — Ambulatory Visit (INDEPENDENT_AMBULATORY_CARE_PROVIDER_SITE_OTHER): Payer: BC Managed Care – PPO | Admitting: Family Medicine

## 2012-04-20 VITALS — BP 118/80 | Temp 98.2°F | Wt 222.0 lb

## 2012-04-20 DIAGNOSIS — R351 Nocturia: Secondary | ICD-10-CM

## 2012-04-20 DIAGNOSIS — E119 Type 2 diabetes mellitus without complications: Secondary | ICD-10-CM

## 2012-04-20 LAB — POCT URINALYSIS DIPSTICK
Leukocytes, UA: NEGATIVE
Protein, UA: NEGATIVE
Spec Grav, UA: 1.015
Urobilinogen, UA: 0.2
pH, UA: 7

## 2012-04-20 LAB — MICROALBUMIN / CREATININE URINE RATIO: Microalb Creat Ratio: 2.6 mg/g (ref 0.0–30.0)

## 2012-04-20 LAB — HEMOGLOBIN A1C: Hgb A1c MFr Bld: 9.7 % — ABNORMAL HIGH (ref 4.6–6.5)

## 2012-04-20 LAB — BASIC METABOLIC PANEL
CO2: 23 mEq/L (ref 19–32)
Calcium: 9.8 mg/dL (ref 8.4–10.5)
Chloride: 99 mEq/L (ref 96–112)
Glucose, Bld: 313 mg/dL — ABNORMAL HIGH (ref 70–99)
Sodium: 132 mEq/L — ABNORMAL LOW (ref 135–145)

## 2012-04-20 MED ORDER — METFORMIN HCL 500 MG PO TABS: ORAL_TABLET | ORAL | Status: DC

## 2012-04-20 NOTE — Telephone Encounter (Signed)
Caller: Jeson/Patient; PCP: Roderick Pee.; CB#: [phone]; Call regarding High Blood Sugar; FSBG 310.  Extreme thirst and frequent urination reported.   ED per Diabetes:  Control Problems protocol.  Verified with Holly/ Dr. Tawanna Cooler to send patient to office immediately.  Caller informed.

## 2012-04-20 NOTE — Patient Instructions (Signed)
Take 500 mg of metformin tonight prior to your evening meal  Starting tomorrow morning take 500 mg of metformin before breakfast and 250 mg of metformin before your evening meal  Follow your diet  Walk 45 minutes daily  Check a fasting blood sugar daily in the morning and return January 9 for followup with a record of all your blood sugar readings

## 2012-04-20 NOTE — Progress Notes (Signed)
  Subjective:    Patient ID: Cody Short, male    DOB: 02-11-1976, 36 y.o.   MRN: [id number]  HPI Cody Short is a 36 year old-year-old single male who comes in today accompanied by his mother for evaluation of diabetes  He last checked his blood sugar 6 months ago was normal right around 100. Today he checked his blood sugar because he was very thirsty and hungry and having a lot of nocturia blood pressure normal blood sugar 310. Historically he noticed about 2 months ago he began having nocturia so that's probably when her sugar went up. He's been taking 250 mg of metformin once a day in the morning has not been following a diet nor exercising   Review of Systems General and metabolic review of systems otherwise negative psychiatric meds stable    Objective:   Physical Exam Well-developed well-nourished male in no acute distress random blood sugar 3 in the afternoon was 307       Assessment & Plan:

## 2012-05-01 ENCOUNTER — Encounter: Payer: Self-pay | Admitting: Family Medicine

## 2012-05-04 ENCOUNTER — Encounter: Payer: Self-pay | Admitting: Family Medicine

## 2012-05-04 ENCOUNTER — Ambulatory Visit (INDEPENDENT_AMBULATORY_CARE_PROVIDER_SITE_OTHER): Payer: BC Managed Care – PPO | Admitting: Family Medicine

## 2012-05-04 VITALS — BP 110/70 | Temp 97.6°F | Wt 220.0 lb

## 2012-05-04 DIAGNOSIS — E119 Type 2 diabetes mellitus without complications: Secondary | ICD-10-CM

## 2012-05-04 NOTE — Progress Notes (Signed)
  Subjective:    Patient ID: Cody Short, male    DOB: [birth year], 36 y.o.   MRN: [id number]  HPI Cody Short is a 36 year old male who comes in today accompanied by his mother,,,,,,,,, single nonsmoker history of bipolar depression,,,,,,, for followup of diabetes type 2  He's on metformin 500 mg in the morning and 250 mg prior to this evening meal. Blood sugars have dropped down to 150 range. No hypoglycemia.   Review of Systems    general metabolic review of systems otherwise negative Objective:   Physical Exam  Well-developed and nourished man in acute distress      Assessment & Plan:  Diabetes type 2 not at goal

## 2012-05-04 NOTE — Patient Instructions (Signed)
Increase the metformin to a full 500 mg tablet twice daily  Continue your diet and exercise program  Fasting blood sugar daily in the morning  Return in 4 weeks for followup

## 2012-06-07 ENCOUNTER — Other Ambulatory Visit (INDEPENDENT_AMBULATORY_CARE_PROVIDER_SITE_OTHER): Payer: BC Managed Care – PPO

## 2012-06-07 DIAGNOSIS — E781 Pure hyperglyceridemia: Secondary | ICD-10-CM

## 2012-06-07 LAB — HEPATIC FUNCTION PANEL
ALT: 46 U/L (ref 0–53)
AST: 38 U/L — ABNORMAL HIGH (ref 0–37)
Total Protein: 7.6 g/dL (ref 6.0–8.3)

## 2012-06-07 LAB — LIPID PANEL
Cholesterol: 116 mg/dL (ref 0–200)
Total CHOL/HDL Ratio: 3

## 2012-06-07 LAB — LDL CHOLESTEROL, DIRECT: Direct LDL: 51.5 mg/dL

## 2012-06-08 ENCOUNTER — Ambulatory Visit: Payer: BC Managed Care – PPO | Admitting: Pharmacist

## 2012-06-09 ENCOUNTER — Ambulatory Visit (INDEPENDENT_AMBULATORY_CARE_PROVIDER_SITE_OTHER): Payer: BC Managed Care – PPO | Admitting: Family Medicine

## 2012-06-09 ENCOUNTER — Encounter: Payer: Self-pay | Admitting: Family Medicine

## 2012-06-09 VITALS — BP 110/80 | Temp 98.0°F | Wt 220.0 lb

## 2012-06-09 DIAGNOSIS — E119 Type 2 diabetes mellitus without complications: Secondary | ICD-10-CM

## 2012-06-09 DIAGNOSIS — E781 Pure hyperglyceridemia: Secondary | ICD-10-CM

## 2012-06-09 NOTE — Progress Notes (Signed)
  Subjective:    Patient ID: Cody Short, male    DOB: 07/08/1976, 36 y.o.   MRN: [id number]  HPI Yuji is a 36 year old male who comes in today for followup of 2 problems  He has diabetes type 2 and takes metformin 500 mg twice a day fasting blood sugar in the 11/16/1928 range  He also takes Crestor 5 mg daily except 10 mg Monday Wednesday Friday his lipids are improved his cholesterol now is 116 triglycerides around 300 no side effects from medication   Review of Systems    general and metabolic review of systems otherwise negative Objective:   Physical Exam Well-developed well-nourished male in no acute distress       Assessment & Plan:  Diabetes type 2 improved control continue current therapy followup in 3 months  Hyperlipidemia improved control continue Crestor but increase dose to 10 mg daily followup in 3 months

## 2012-06-09 NOTE — Patient Instructions (Signed)
Continue the metformin one tablet twice daily  Take the Crestor 10 mg daily  Return in 3 months for followup  Fasting labs one week prior

## 2012-06-10 ENCOUNTER — Ambulatory Visit: Payer: BC Managed Care – PPO | Admitting: Pharmacist

## 2012-06-21 ENCOUNTER — Telehealth: Payer: Self-pay | Admitting: Family Medicine

## 2012-06-21 NOTE — Telephone Encounter (Signed)
Pt called req to get samples of rosuvastatin (CRESTOR) 10 MG tablet, to last until pts next ov 3 month now.

## 2012-06-21 NOTE — Telephone Encounter (Signed)
1 month samples available

## 2012-07-19 ENCOUNTER — Ambulatory Visit (INDEPENDENT_AMBULATORY_CARE_PROVIDER_SITE_OTHER): Payer: BC Managed Care – PPO | Admitting: Family Medicine

## 2012-07-19 ENCOUNTER — Other Ambulatory Visit: Payer: Self-pay | Admitting: *Deleted

## 2012-07-19 DIAGNOSIS — Z23 Encounter for immunization: Secondary | ICD-10-CM

## 2012-07-19 MED ORDER — METFORMIN HCL 1000 MG PO TABS: 1000.0000 mg | ORAL_TABLET | Freq: Two times a day (BID) | ORAL | Status: DC

## 2012-09-01 ENCOUNTER — Other Ambulatory Visit (INDEPENDENT_AMBULATORY_CARE_PROVIDER_SITE_OTHER): Payer: BC Managed Care – PPO

## 2012-09-01 DIAGNOSIS — E119 Type 2 diabetes mellitus without complications: Secondary | ICD-10-CM

## 2012-09-01 DIAGNOSIS — E781 Pure hyperglyceridemia: Secondary | ICD-10-CM

## 2012-09-01 LAB — BASIC METABOLIC PANEL
Calcium: 9.6 mg/dL (ref 8.4–10.5)
GFR: 84.1 mL/min (ref >60.00)
Potassium: 4 mEq/L (ref 3.5–5.1)
Sodium: 139 mEq/L (ref 135–145)

## 2012-09-01 LAB — MICROALBUMIN / CREATININE URINE RATIO
Creatinine,U: 114 mg/dL
Microalb Creat Ratio: 1.8 mg/g (ref 0.0–30.0)
Microalb, Ur: 2.1 mg/dL — ABNORMAL HIGH (ref 0.0–1.9)

## 2012-09-01 LAB — LIPID PANEL
HDL: 28.6 mg/dL — ABNORMAL LOW (ref >39.00)
Triglycerides: 414 mg/dL — ABNORMAL HIGH (ref 0.0–149.0)
VLDL: 82.8 mg/dL — ABNORMAL HIGH (ref 0.0–40.0)

## 2012-09-01 LAB — HEMOGLOBIN A1C: Hgb A1c MFr Bld: 7.6 % — ABNORMAL HIGH (ref 4.6–6.5)

## 2012-09-01 LAB — LDL CHOLESTEROL, DIRECT: Direct LDL: 47.2 mg/dL

## 2012-09-07 ENCOUNTER — Encounter: Payer: Self-pay | Admitting: Family Medicine

## 2012-09-07 ENCOUNTER — Ambulatory Visit (INDEPENDENT_AMBULATORY_CARE_PROVIDER_SITE_OTHER): Payer: BC Managed Care – PPO | Admitting: Family Medicine

## 2012-09-07 VITALS — BP 124/80 | Temp 99.0°F | Wt 226.0 lb

## 2012-09-07 DIAGNOSIS — E119 Type 2 diabetes mellitus without complications: Secondary | ICD-10-CM

## 2012-09-07 MED ORDER — GLIPIZIDE 5 MG PO TABS: 5.0000 mg | ORAL_TABLET | Freq: Two times a day (BID) | ORAL | Status: DC

## 2012-09-07 NOTE — Patient Instructions (Signed)
Continue the metformin 1000 mg twice daily  Add the Glucotrol one twice daily  Drink 32 ounces of water daily  Walk 30 minutes daily  Fasting blood sugar daily in the morning and return in 2 weeks for followup

## 2012-09-07 NOTE — Progress Notes (Signed)
  Subjective:    Patient ID: Cody Short, male    DOB: 13-Jan-1976, 36 y.o.   MRN: [id number]  HPI This is a 36 year old single male who comes in today for followup of diabetes type 2  On metformin 1000 mg a day his blood sugars averaging 160  He is on the antipsychotic medication Haldol and Seroquel     Review of Systems General and metabolic review of systems otherwise negative    Objective:   Physical Exam Well-developed overweight slightly male no acute distress       Assessment & Plan:  Diabetes type 2 ,,,,,,,,,,,, not at goal add

## 2012-09-21 ENCOUNTER — Encounter: Payer: Self-pay | Admitting: Family Medicine

## 2012-09-21 ENCOUNTER — Ambulatory Visit (INDEPENDENT_AMBULATORY_CARE_PROVIDER_SITE_OTHER): Payer: BC Managed Care – PPO | Admitting: Family Medicine

## 2012-09-21 VITALS — BP 124/80 | Temp 97.9°F | Wt 228.0 lb

## 2012-09-21 DIAGNOSIS — E119 Type 2 diabetes mellitus without complications: Secondary | ICD-10-CM

## 2012-09-21 NOTE — Progress Notes (Signed)
  Subjective:    Patient ID: Cody Short, male    DOB: Dec 07, 1975, 36 y.o.   MRN: [id number]  HPI Cody Short is a 36 year old single male nonsmoker who comes in today for followup of diabetes  We increased his Glucotrol to 5 mg twice a day and maintained his metformin 1000 mg twice a day. Blood sugars now are fasting 1:30 average. His A1c 2 weeks ago was 7.6%  No hypoglycemia   Review of Systems General and metabolic review of systems otherwise negative    Objective:   Physical Exam Well-developed well-nourished male in no acute distress       Assessment & Plan:  Diabetes type 2 approach and goal continue current therapy followup in 2 months

## 2012-09-21 NOTE — Patient Instructions (Signed)
Continue your current medications  Walk 30 minutes daily  Check a fasting blood sugar Monday Wednesday Friday  Return in 3 months for followup  Nonfasting labs one week prior

## 2012-10-15 ENCOUNTER — Telehealth: Payer: Self-pay | Admitting: Family Medicine

## 2012-10-15 NOTE — Telephone Encounter (Signed)
Samples available and patient is aware 

## 2012-10-15 NOTE — Telephone Encounter (Signed)
Pt requesting samples of crestor 10 mg

## 2012-11-04 ENCOUNTER — Ambulatory Visit: Payer: BC Managed Care – PPO | Admitting: Family Medicine

## 2012-11-30 ENCOUNTER — Other Ambulatory Visit: Payer: Self-pay | Admitting: Family Medicine

## 2012-11-30 NOTE — Telephone Encounter (Signed)
Pt would like samples of rosuvastatin (CRESTOR) 10 MG tablet. Pt;s brother has appt today at 12:30 and he would like to pick up then if  possible.

## 2012-11-30 NOTE — Telephone Encounter (Signed)
Samples available and patient is aware 

## 2012-12-15 ENCOUNTER — Other Ambulatory Visit (INDEPENDENT_AMBULATORY_CARE_PROVIDER_SITE_OTHER): Payer: BC Managed Care – PPO

## 2012-12-15 DIAGNOSIS — E119 Type 2 diabetes mellitus without complications: Secondary | ICD-10-CM

## 2012-12-15 LAB — LIPID PANEL
Cholesterol: 103 mg/dL (ref 0–200)
HDL: 26.5 mg/dL — ABNORMAL LOW (ref >39.00)
VLDL: 89.6 mg/dL — ABNORMAL HIGH (ref 0.0–40.0)

## 2012-12-15 LAB — BASIC METABOLIC PANEL
Calcium: 10 mg/dL (ref 8.4–10.5)
GFR: 79.65 mL/min (ref >60.00)
Potassium: 4 mEq/L (ref 3.5–5.1)
Sodium: 138 mEq/L (ref 135–145)

## 2012-12-22 ENCOUNTER — Encounter: Payer: Self-pay | Admitting: Family Medicine

## 2012-12-22 ENCOUNTER — Ambulatory Visit (INDEPENDENT_AMBULATORY_CARE_PROVIDER_SITE_OTHER): Payer: BC Managed Care – PPO | Admitting: Family Medicine

## 2012-12-22 VITALS — BP 110/80 | Temp 97.9°F | Wt 228.0 lb

## 2012-12-22 DIAGNOSIS — E119 Type 2 diabetes mellitus without complications: Secondary | ICD-10-CM

## 2012-12-22 DIAGNOSIS — L702 Acne varioliformis: Secondary | ICD-10-CM

## 2012-12-22 DIAGNOSIS — E781 Pure hyperglyceridemia: Secondary | ICD-10-CM

## 2012-12-22 NOTE — Patient Instructions (Signed)
Continue current medications for your diabetes  At 30 minutes of walking a day  Followup in 3 months  Labs one week prior  Call Dr. Para Skeans for consult regarding a skin rash

## 2012-12-22 NOTE — Progress Notes (Signed)
  Subjective:    Patient ID: Cody Short, male    DOB: 13-Aug-1976, 37 y.o.   MRN: [id number]  HPI Cody Short is a 37 year old male who comes in today for multiple concerns  He has a history of underlying mental health problems and is currently on Seroquel 600 mg each bedtime, Inderal 40 mg twice a day, Haldol 1 mg daily, lithium 600 mg in the morning and 1200 mg at bedtime  He's followed at mental health and seems to be functioning well  His blood sugar on Glucotrol 5 mg twice a day and metformin 1000 mg twice a day has dropped to 137 with an A1c of 6.7%.  His marked hyperlipidemia with elevated triglycerides in the 400 range. He also is on Tri-Chlor. He's also on Crestor but can only tolerate 10 mg Monday Wednesday Friday. He's not walking on a regular basis.  He also has a skin rash on his back.   Review of Systems Review of systems otherwise negative except for no exercise and weight still high at 228    Objective:   Physical Exam Well-developed well-nourished male in no acute distress examination of skin shows a diffuse macular papular rash over the entire back       Assessment & Plan:  Diabetes type 2 under improved control continue current therapy followup A1c in 3 months  Skin rash unknown etiology consult with Cody Short  Bipolar depression continue current medications and followup by psychiatry  Hypertriglyceridemia continue medications but most importantly work on the diet and exercise

## 2012-12-28 ENCOUNTER — Encounter: Payer: Self-pay | Admitting: Family Medicine

## 2012-12-28 ENCOUNTER — Telehealth: Payer: Self-pay | Admitting: Family Medicine

## 2012-12-28 NOTE — Telephone Encounter (Signed)
Patient is calling because he has had cramps in  His left leg and left abdominal area that comes and goes.  Please advise?

## 2012-12-28 NOTE — Telephone Encounter (Signed)
Pt states he forgot to ask the MD an important question when he was here Wed, Feb 26. Pls call pt.

## 2013-01-04 NOTE — Telephone Encounter (Signed)
Spoke with patient.  Advise to increase fluids.

## 2013-01-13 ENCOUNTER — Other Ambulatory Visit: Payer: Self-pay | Admitting: Family Medicine

## 2013-02-28 ENCOUNTER — Telehealth: Payer: Self-pay | Admitting: Family Medicine

## 2013-02-28 NOTE — Telephone Encounter (Signed)
Patient called stating that he would like samples of crestor 10mg  1poqd. Please assist.

## 2013-02-28 NOTE — Telephone Encounter (Signed)
Samples available for pickup  

## 2013-03-15 ENCOUNTER — Other Ambulatory Visit (INDEPENDENT_AMBULATORY_CARE_PROVIDER_SITE_OTHER): Payer: BC Managed Care – PPO

## 2013-03-15 DIAGNOSIS — E781 Pure hyperglyceridemia: Secondary | ICD-10-CM

## 2013-03-15 DIAGNOSIS — E119 Type 2 diabetes mellitus without complications: Secondary | ICD-10-CM

## 2013-03-15 LAB — MICROALBUMIN / CREATININE URINE RATIO
Creatinine,U: 164.4 mg/dL
Microalb Creat Ratio: 3.4 mg/g (ref 0.0–30.0)
Microalb, Ur: 5.6 mg/dL — ABNORMAL HIGH (ref 0.0–1.9)

## 2013-03-15 LAB — LIPID PANEL
Cholesterol: 109 mg/dL (ref 0–200)
HDL: 25.4 mg/dL — ABNORMAL LOW
Total CHOL/HDL Ratio: 4
Triglycerides: 509 mg/dL — ABNORMAL HIGH (ref 0.0–149.0)
VLDL: 101.8 mg/dL — ABNORMAL HIGH (ref 0.0–40.0)

## 2013-03-15 LAB — BASIC METABOLIC PANEL WITH GFR
BUN: 10 mg/dL (ref 6–23)
CO2: 22 meq/L (ref 19–32)
Calcium: 9.8 mg/dL (ref 8.4–10.5)
Chloride: 107 meq/L (ref 96–112)
Creatinine, Ser: 1 mg/dL (ref 0.4–1.5)
GFR: 86.63 mL/min
Glucose, Bld: 127 mg/dL — ABNORMAL HIGH (ref 70–99)
Potassium: 4 meq/L (ref 3.5–5.1)
Sodium: 138 meq/L (ref 135–145)

## 2013-03-15 LAB — HEMOGLOBIN A1C: Hgb A1c MFr Bld: 7 % — ABNORMAL HIGH (ref 4.6–6.5)

## 2013-03-22 ENCOUNTER — Ambulatory Visit: Payer: BC Managed Care – PPO | Admitting: Family Medicine

## 2013-03-23 ENCOUNTER — Encounter: Payer: Self-pay | Admitting: Family Medicine

## 2013-03-23 ENCOUNTER — Ambulatory Visit (INDEPENDENT_AMBULATORY_CARE_PROVIDER_SITE_OTHER): Payer: BC Managed Care – PPO | Admitting: Family Medicine

## 2013-03-23 DIAGNOSIS — F319 Bipolar disorder, unspecified: Secondary | ICD-10-CM | POA: Insufficient documentation

## 2013-03-23 DIAGNOSIS — F3130 Bipolar disorder, current episode depressed, mild or moderate severity, unspecified: Secondary | ICD-10-CM

## 2013-03-23 DIAGNOSIS — E781 Pure hyperglyceridemia: Secondary | ICD-10-CM

## 2013-03-23 DIAGNOSIS — R351 Nocturia: Secondary | ICD-10-CM

## 2013-03-23 DIAGNOSIS — E119 Type 2 diabetes mellitus without complications: Secondary | ICD-10-CM

## 2013-03-23 NOTE — Progress Notes (Signed)
  Subjective:    Patient ID: Cody Short, male    DOB: 02-18-1976, 37 y.o.   MRN: [id number]  HPI Cody Short is a 37 year old single male nonsmoker who comes in today for followup of hyperlipidemia and diabetes  He has started an exercise program his blood sugar the other day dropped to 54 around 2:00 in the afternoon. He's been taking his glipizide 5 mg twice a day and metformin 1000 mg twice a day  He has some discoloration of the fourth and fifth toenails on his left foot  The leg cramps are gone. He still drinking a lot of diet Gatorade. Advised to stop that.  He is on Crestor 10 mg Monday Wednesday Friday. He can't tolerate any more than that because of that joint and muscle pain. Lipids recently were rechecked. His triglycerides are still high 500 and LDL has dropped to 36 HDL 25 total cholesterol 109. He's also taking TriCor 145 mg daily     Review of Systems Review of systems otherwise negative except that his psychiatrist is decreasing his lithium from 2 in the morning and 4 in the evening to one in the morning and 4 in the evening and stopped his Haldol    Objective:   Physical Exam  Well-developed well-nourished male no acute distress examination of feet shows normal pulses no neuropathy fungal infection fourth and fifth toe nails      Assessment & Plan:  Diabetes type 2 at goal ,,,,,,,,,, now with symptoms of hypoglycemia since he started an exercise program. Decrease metformin to 500 mg in the morning fasting blood sugar daily followup in one month  Hypertriglyceridemia diet exercise increase the TriCor ,,,,,,,,, daily  Hypertension at goal BP 120/88 continue antihypertensive medication.

## 2013-03-23 NOTE — Patient Instructions (Signed)
Continue the glipizide 5 mg twice a day but decrease the morning dose of metformin to 500 mg  Keep the evening dose of metformin to 1000 mg  Check a fasting blood sugar daily in the morning or at any time you feel your blood sugar might be dropping. Return in one month for followup sooner if any problems  Take 2 TriCor daily  Continue your 30 minute walking daily  Avoid also this and supplements like Gatorade

## 2013-03-28 ENCOUNTER — Other Ambulatory Visit: Payer: Self-pay | Admitting: Family Medicine

## 2013-03-30 ENCOUNTER — Other Ambulatory Visit: Payer: Self-pay | Admitting: *Deleted

## 2013-03-30 MED ORDER — FENOFIBRATE 145 MG PO TABS: 145.0000 mg | ORAL_TABLET | Freq: Two times a day (BID) | ORAL | Status: DC

## 2013-04-07 ENCOUNTER — Telehealth: Payer: Self-pay | Admitting: Family Medicine

## 2013-04-07 MED ORDER — FENOFIBRATE 145 MG PO TABS: 145.0000 mg | ORAL_TABLET | Freq: Two times a day (BID) | ORAL | Status: DC

## 2013-04-07 NOTE — Telephone Encounter (Signed)
Pt called and stated that his fenofibrate (TRICOR) 145 MG tablet RX is wrong. He states that his dosage was recently increased to 2 tablets, and the instructions do not reflect this. Please assist.

## 2013-04-07 NOTE — Telephone Encounter (Signed)
New Rx sent to pharmacy

## 2013-04-11 ENCOUNTER — Telehealth: Payer: Self-pay | Admitting: Family Medicine

## 2013-04-11 NOTE — Telephone Encounter (Signed)
Spoke with pharmacy and patient.

## 2013-04-11 NOTE — Telephone Encounter (Signed)
Pt states his phenofibrate is to be twice daily. However, [facility] on [address] only has it for once daily, so he ran out and they will not give him anymore. Please send BID rx to the pharm, per pt request. He wants a call stating it's fixed (FYI)

## 2013-04-25 ENCOUNTER — Ambulatory Visit (INDEPENDENT_AMBULATORY_CARE_PROVIDER_SITE_OTHER): Payer: BC Managed Care – PPO | Admitting: Family Medicine

## 2013-04-25 ENCOUNTER — Encounter: Payer: Self-pay | Admitting: Family Medicine

## 2013-04-25 VITALS — BP 120/80 | Temp 97.9°F | Wt 222.0 lb

## 2013-04-25 DIAGNOSIS — J45909 Unspecified asthma, uncomplicated: Secondary | ICD-10-CM

## 2013-04-25 DIAGNOSIS — E781 Pure hyperglyceridemia: Secondary | ICD-10-CM

## 2013-04-25 DIAGNOSIS — E119 Type 2 diabetes mellitus without complications: Secondary | ICD-10-CM

## 2013-04-25 MED ORDER — BECLOMETHASONE DIPROPIONATE 40 MCG/ACT IN AERS: 2.0000 | INHALATION_SPRAY | Freq: Two times a day (BID) | RESPIRATORY_TRACT | Status: DC

## 2013-04-25 NOTE — Progress Notes (Signed)
  Subjective:    Patient ID: Cody Short, male    DOB: Dec 31, 1975, 37 y.o.   MRN: [id number]  HPI Cody Short is a 37 year old male single nonsmoker who comes in today for evaluation of 2 problems  He has diabetes type 2 is on metformin 500 mg in the morning and 1000 mg prior to his evening male  He also takes glipizide 5 mg twice daily. Blood sugars are in the 11/16/1928 range. Recent A1c 7.0%  His psychiatrist has recently increased his lithium. He takes one in the morning and 4 at bedtime  For the past 5 days she's had a cough. No fever shortness of breath sore throat or sputum production. He has no earache. He has a history of allergic rhinitis but never asthma.  Family history of asthma Review of Systems    review of systems otherwise negative  Objective:   Physical Exam Well-developed well-nourished male no acute distress examination of the HEENT were negative the neck was supple no adenopathy lungs are clear except for symmetrical late expiratory wheezing on forced expiration       Assessment & Plan:  Asthma question trigger plan Qvar twice a day return in one week for followup    Diabetes type 2 controlled well continue current therapy

## 2013-04-25 NOTE — Patient Instructions (Signed)
Qvar 40,,,,,,,,,,, 2 puffs twice daily  After each use swish and spit with mouthwash  Return in 2 weeks for followup

## 2013-05-09 ENCOUNTER — Ambulatory Visit (INDEPENDENT_AMBULATORY_CARE_PROVIDER_SITE_OTHER): Payer: BC Managed Care – PPO | Admitting: Family Medicine

## 2013-05-09 ENCOUNTER — Encounter: Payer: Self-pay | Admitting: Family Medicine

## 2013-05-09 VITALS — BP 124/80 | Temp 98.9°F | Wt 227.0 lb

## 2013-05-09 DIAGNOSIS — E119 Type 2 diabetes mellitus without complications: Secondary | ICD-10-CM

## 2013-05-09 DIAGNOSIS — E781 Pure hyperglyceridemia: Secondary | ICD-10-CM

## 2013-05-09 DIAGNOSIS — F319 Bipolar disorder, unspecified: Secondary | ICD-10-CM

## 2013-05-09 DIAGNOSIS — F3130 Bipolar disorder, current episode depressed, mild or moderate severity, unspecified: Secondary | ICD-10-CM

## 2013-05-09 MED ORDER — METFORMIN HCL 1000 MG PO TABS: ORAL_TABLET | ORAL | Status: DC

## 2013-05-09 NOTE — Patient Instructions (Addendum)
Increase the metformin to 1000 mg twice daily  Continue the Glucotrol 5 mg twice daily  Continue to avoid sugar  Walk 30 minutes daily  Check a fasting blood sugar daily in the morning and return in 4 weeks for followup  Continue to hold the Crestor  Call your psychiatrist to see if the muscle cramps are related to the psychiatric medications

## 2013-05-09 NOTE — Progress Notes (Signed)
  Subjective:    Patient ID: Cody Short, male    DOB: 02-01-1976, 37 y.o.   MRN: [id number]  HPI Cody Short is a 37 year old male who comes in today for evaluation of diabetes and leg cramps  He has diabetes type 2 on metformin 500 mg in morning and 1000 mg prior to his evening meal along with Glucotrol 5 mg twice a day. Blood sugars are averaging 170-180  He says he has severe cramps it occurs all over his body. He stopped the Crestor thinking it may be related that but the cramps have persisted. I asked him to call his psychiatrist it may be a relationship to psychiatric medication. Also Seroquel was recently increased to 800 mg a day because he was having more manic episodes   Review of Systems    review of systems negative Objective:   Physical Exam  Well-developed well-nourished male no acute distress vital signs stable blood pressure normal 124/80 examination of feet skin looks normal pulses are normal the toenails are improved the soaking and filing his nails because they are have chronic fungal infection      Assessment & Plan:  Diabetes type 2 not at goal increase metformin to thousand milligrams twice a day continue glipizide 5 twice a day followup in 4 weeks  Diffuse body cramps continue to hold the Crestor call psychiatry for feedback

## 2013-06-14 ENCOUNTER — Ambulatory Visit: Payer: BC Managed Care – PPO | Admitting: Family Medicine

## 2013-06-20 ENCOUNTER — Telehealth: Payer: Self-pay | Admitting: Family Medicine

## 2013-06-20 NOTE — Telephone Encounter (Signed)
Please schedule an office visit for patient to obtain an EKG

## 2013-06-20 NOTE — Telephone Encounter (Signed)
PT is calling to request an appointment for an EKG. Dr. Haywood Lasso is requesting that he complete one, prior to increasing his Seroquel dosage. Please assist.

## 2013-06-21 ENCOUNTER — Encounter: Payer: Self-pay | Admitting: Internal Medicine

## 2013-06-21 ENCOUNTER — Ambulatory Visit (INDEPENDENT_AMBULATORY_CARE_PROVIDER_SITE_OTHER): Payer: BC Managed Care – PPO | Admitting: Internal Medicine

## 2013-06-21 VITALS — BP 120/88 | Temp 98.8°F | Wt 227.0 lb

## 2013-06-21 DIAGNOSIS — J45909 Unspecified asthma, uncomplicated: Secondary | ICD-10-CM

## 2013-06-21 DIAGNOSIS — E119 Type 2 diabetes mellitus without complications: Secondary | ICD-10-CM

## 2013-06-21 DIAGNOSIS — F319 Bipolar disorder, unspecified: Secondary | ICD-10-CM

## 2013-06-21 DIAGNOSIS — F3130 Bipolar disorder, current episode depressed, mild or moderate severity, unspecified: Secondary | ICD-10-CM

## 2013-06-21 DIAGNOSIS — Z79899 Other long term (current) drug therapy: Secondary | ICD-10-CM

## 2013-06-21 MED ORDER — GLIPIZIDE ER 2.5 MG PO TB24: 2.5000 mg | ORAL_TABLET | Freq: Every day | ORAL | Status: DC

## 2013-06-21 NOTE — Progress Notes (Signed)
Subjective:    Patient ID: Cody Short, male    DOB: 1976/01/01, 37 y.o.   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  Unfortunately his bipolar disorder has required treatment with high-dose Seroquel. He is seen here today for followup. It has been requested by his psychiatrist to have a EKG performed. This was performed and the patient given a copy. There is no QT interval prolongation  He states that he has had an occasional hypoglycemic episodes since metformin was increased to 2 g daily in divided dosages he is also on glipizide 5 mg daily  Past Medical History  Diagnosis Date  . 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 DM w/o Complication Type II 07/05/2007  . HYPERTRIGLYCERIDEMIA 10/14/2010  . Nocturia 07/04/2010  . PILAR CYST 08/03/2007  . TOBACCO ABUSE, HX OF 07/31/2009    History   Social History  . Marital Status: Single    Spouse Name: N/A    Number of Children: N/A  . Years of Education: N/A   Occupational History  . Not on file.   Social History Main Topics  . Smoking status: Former Games developer  . Smokeless tobacco: Not on file  . Alcohol Use: No  . Drug Use: No  . Sexual Activity:    Other Topics Concern  . Not on file   Social History Narrative  . No narrative on file    History reviewed. No pertinent past surgical history.  Family History  Problem Relation Age of Onset  . Diabetes Mother   . Diabetes Father     Allergies  Allergen Reactions  . Divalproex Sodium   . Methylphenidate Hcl     Aggravate bipolar  . Oxycodone Hcl     REACTION: hallucinations  . Paroxetine     Aggravate bipolar disorder    Current Outpatient Prescriptions on File Prior to Visit  Medication Sig Dispense Refill  . beclomethasone (QVAR) 40 MCG/ACT inhaler Inhale 2 puffs into the lungs 2 (two) times daily.  1 Inhaler  12  . fenofibrate (TRICOR) 145 MG tablet Take 1 tablet (145 mg total) by  mouth 2 (two) times daily.  180 tablet  3  . glucose blood (ONE TOUCH TEST STRIPS) test strip 1 each by Other route daily. Use as instructed       . lithium 300 MG capsule Take by mouth. 1 every morning , 4 every evening      . metFORMIN (GLUCOPHAGE) 1000 MG tablet 1 by mouth twice a day  200 tablet  3  . ONE TOUCH LANCETS MISC by Does not apply route daily.        . propranolol (INDERAL) 40 MG tablet Take 40 mg by mouth 2 (two) times daily.       . QUEtiapine (SEROQUEL) 400 MG tablet 2 tabs at bedtime      . rosuvastatin (CRESTOR) 10 MG tablet Take 5mg  everyday EXCEPT 10mg  on Monday, Wednesday, Friday.  28 tablet  3   No current facility-administered medications on file prior to visit.    BP 120/88  Temp(Src) 98.8 F (37.1 C) (Oral)  Wt 227 lb (102.967 kg)  BMI 32.57 kg/m2       Review of Systems  Constitutional: Positive for unexpected weight change. Negative for fever, chills, appetite change and fatigue.  HENT: Negative for hearing loss, ear pain, congestion, sore throat, trouble swallowing, neck stiffness, dental problem, voice change and  tinnitus.   Eyes: Negative for pain, discharge and visual disturbance.  Respiratory: Negative for cough, chest tightness, wheezing and stridor.   Cardiovascular: Negative for chest pain, palpitations and leg swelling.  Gastrointestinal: Negative for nausea, vomiting, abdominal pain, diarrhea, constipation, blood in stool and abdominal distention.  Genitourinary: Negative for urgency, hematuria, flank pain, discharge, difficulty urinating and genital sores.  Musculoskeletal: Negative for myalgias, back pain, joint swelling, arthralgias and gait problem.  Skin: Negative for rash.  Neurological: Negative for dizziness, syncope, speech difficulty, weakness, numbness and headaches.  Hematological: Negative for adenopathy. Does not bruise/bleed easily.  Psychiatric/Behavioral: Positive for dysphoric mood. Negative for behavioral problems. The  patient is not nervous/anxious.        Objective:   Physical Exam  Constitutional: He is oriented to person, place, and time. He appears well-developed.  Weight 227 Blood pressure normal  HENT:  Head: Normocephalic.  Right Ear: External ear normal.  Left Ear: External ear normal.  Eyes: Conjunctivae and EOM are normal.  Neck: Normal range of motion.  Cardiovascular: Normal rate and normal heart sounds.   Pulmonary/Chest: Breath sounds normal.  Abdominal: Bowel sounds are normal.  Musculoskeletal: Normal range of motion. He exhibits no edema and no tenderness.  Neurological: He is alert and oriented to person, place, and time.  Psychiatric: He has a normal mood and affect. His behavior is normal.          Assessment & Plan:   Bipolar depression. An EKG was performed and a copy given to the patient Diabetes mellitus. Will check a hemoglobin A1c in view of his occasional hypoglycemia will decrease glipizide to 2.5 mg daily and changed to extended release. Recheck hemoglobin A1c in 3 months Weight loss encouraged

## 2013-06-21 NOTE — Patient Instructions (Addendum)
You need to lose weight.  Consider a lower calorie diet and regular exercise.    It is important that you exercise regularly, at least 20 minutes 3 to 4 times per week.  If you develop chest pain or shortness of breath seek  medical attention.   Please check your hemoglobin A1c every 3 months   

## 2013-07-26 ENCOUNTER — Ambulatory Visit: Payer: BC Managed Care – PPO | Admitting: Family Medicine

## 2013-07-28 ENCOUNTER — Ambulatory Visit: Payer: BC Managed Care – PPO | Admitting: Family Medicine

## 2013-08-15 ENCOUNTER — Encounter: Payer: Self-pay | Admitting: Family Medicine

## 2013-08-15 ENCOUNTER — Ambulatory Visit (INDEPENDENT_AMBULATORY_CARE_PROVIDER_SITE_OTHER): Payer: BC Managed Care – PPO | Admitting: Family Medicine

## 2013-08-15 VITALS — BP 130/90 | Temp 98.3°F | Wt 228.0 lb

## 2013-08-15 DIAGNOSIS — M545 Low back pain, unspecified: Secondary | ICD-10-CM | POA: Insufficient documentation

## 2013-08-15 DIAGNOSIS — Z23 Encounter for immunization: Secondary | ICD-10-CM

## 2013-08-15 DIAGNOSIS — M533 Sacrococcygeal disorders, not elsewhere classified: Secondary | ICD-10-CM

## 2013-08-15 MED ORDER — TRAMADOL HCL 50 MG PO TABS: ORAL_TABLET | ORAL | Status: DC

## 2013-08-15 MED ORDER — CYCLOBENZAPRINE HCL 5 MG PO TABS: ORAL_TABLET | ORAL | Status: DC

## 2013-08-15 NOTE — Patient Instructions (Signed)
Avoid sitting  When you get home and even go to bed early and dry HEENT head. Be sure to use low heat and cover with a towel  Flexeril and tramadol,,,,,,,,,,, one half of each at bedtime  Motrin 600 mg twice daily with food

## 2013-08-15 NOTE — Progress Notes (Signed)
  Subjective:    Patient ID: Cody Short, male    DOB: 06-16-1976, 37 y.o.   MRN: [id number]  HPI This is a 37 year old single male who comes in today for evaluation of low back pain  He states on Friday this past week he woke up with low back pain. He describes as constant sometimes sharp but mostly dull. It's a 4 on a scale of 1-10. It doesn't radiate. No history of trauma.  He works at [employer] and he also works at a [employer] account planner. He doesn't recall any trauma or heavy lifting that precipitated the pain at work.  He's Fleet Contras is back like this before.  The pain is increased by bending and decreased by lying down.  No bowel nor bladder dysfunction  He does have underlying diabetes hemoglobin A1c a month ago was 7.0%.  His psychiatrist is in the process of altering his medications   Review of Systems    review of systems otherwise negative Objective:   Physical Exam  Well-developed well-nourished male no acute distress  In the supine position both legs were of equal length. Sensation muscle strength reflexes are within normal limits straight leg raising bilaterally negative      Assessment & Plan:  Low back pain,,,,,,,,,, plan treat symptomatically with Motrin

## 2013-08-31 ENCOUNTER — Telehealth: Payer: Self-pay | Admitting: Family Medicine

## 2013-08-31 NOTE — Telephone Encounter (Signed)
Pt states someone called him from this number. Was it you?

## 2013-09-01 ENCOUNTER — Telehealth: Payer: Self-pay | Admitting: Family Medicine

## 2013-09-01 NOTE — Telephone Encounter (Signed)
no

## 2013-09-01 NOTE — Telephone Encounter (Signed)
Patient Information:  Caller Name: [name]  Phone: [phone]  Patient: Cody Short  Gender: Male  DOB: [birth year]  Age: 37 Years  PCP: Kelle [facility])  Office Follow Up:  Does the office need to follow up with this patient?: No  Instructions For The Office: N/A   Symptoms  Reason For Call & Symptoms: Onset 3 months ago of over right lower back muscle spasms.  4 days ago left lower back pain/spasm (rates 4/10).  Hurts with sitting also.  Reviewed Health History In EMR: Yes  Reviewed Medications In EMR: Yes  Reviewed Allergies In EMR: Yes  Reviewed Surgeries / Procedures: Yes  Date of Onset of Symptoms: 08/28/2013  Treatments Tried: Ibuprofen 600 mg BID, Tramadol 1/2 of 50 mg tab at HS, Flexeril 1/2 of 5 mg tab at HS  Treatments Tried Worked: Yes  Guideline(s) Used:  Back Pain  Disposition Per Guideline:   Home Care  Reason For Disposition Reached:   Back pain  Advice Given:  Reassurance:  Twisting or heavy lifting can cause back pain.  With treatment, the pain most often goes away in 1-2 weeks.  You can treat most back pain at home.  Here is some care advice that should help.  Cold or Heat:  Heat Pack: If pain lasts over 2 days, apply heat to the sore area. Use a heat pack, heating pad, or warm wet washcloth. Do this for 10 minutes, then as needed. For widespread stiffness, take a hot bath or hot shower instead. Move the sore area under the warm water.  Sleep:  Sleep on your side with a pillow between your knees. If you sleep on your back, put a pillow under your knees.  Avoid sleeping on your stomach.  Your mattress should be firm. Avoid waterbeds.  Activity  Keep doing your day-to-day activities if it is not too painful. Staying active is better than resting.  Avoid anything that makes your pain worse. Avoid heavy lifting, twisting, and too much exercise until your back heals.  You do not need to stay in bed.  Pain Medicines:  For pain  relief, take acetaminophen, ibuprofen, or naproxen.  Use the lowest amount of medicine that makes your pain feel better.  Ibuprofen (e.g., Motrin, Advil):  Another choice is to take 600 mg (three 200 mg pills) by mouth every 8 hours.  The most you should take each day is 1,200 mg (six 200 mg pills), unless your doctor has told you to take more.  Call Back If:  Numbness or weakness occur  Bowel/bladder problems occur  Pain lasts for more than 2 weeks  You become worse.  Good Body Mechanics:  Lifting: Stand close to the object to be lifted. Keep your back straight and lift by bending your legs. Ask for lifting help if needed.  Sleeping: Sleep on a firm mattress.  Sitting: Avoid sitting for long periods of time without a break. Avoid slouching. Place a pillow or towel behind your lower back for support.  Posture: Maintain good posture.  Patient Will Follow Care Advice:  YES

## 2013-09-01 NOTE — Telephone Encounter (Signed)
Pt advised no one called him.

## 2013-09-20 ENCOUNTER — Ambulatory Visit: Payer: BC Managed Care – PPO | Admitting: Family Medicine

## 2013-10-04 ENCOUNTER — Ambulatory Visit: Payer: BC Managed Care – PPO | Admitting: Family Medicine

## 2013-12-15 ENCOUNTER — Telehealth: Payer: Self-pay | Admitting: Family Medicine

## 2013-12-15 NOTE — Telephone Encounter (Signed)
Pt called in to inform you of the symptoms he is having since being prescribed cyclobenzaprine (FLEXERIL) 5 MG tablet to help with his muscle spasms and cramps; nosebleeds, still having cramps all over that "come and go".  Wants to know can another bi-polar medication be prescribed. Please advise.

## 2013-12-15 NOTE — Telephone Encounter (Signed)
Per Dr Tawanna Coolerodd patient will have to speak with whom ever prescribed his bi-polar medication. Left message on machine for patient to return our call.

## 2013-12-16 NOTE — Telephone Encounter (Signed)
Left message on machine for patient returning his call. 

## 2013-12-16 NOTE — Telephone Encounter (Signed)
Pt returning your call and asked if you could call his cell phone  [phone] w/in the next 30 min, or after that, house phone> home  [phone]

## 2013-12-19 NOTE — Telephone Encounter (Signed)
Spoke with patient and an appointment made for leg cramps.  His nose bleeds have stopped and he has spoken with his psychiatrist about changing his meds.

## 2014-01-03 ENCOUNTER — Encounter: Payer: Self-pay | Admitting: Family Medicine

## 2014-01-03 ENCOUNTER — Ambulatory Visit (INDEPENDENT_AMBULATORY_CARE_PROVIDER_SITE_OTHER): Payer: BC Managed Care – PPO | Admitting: Family Medicine

## 2014-01-03 DIAGNOSIS — J45909 Unspecified asthma, uncomplicated: Secondary | ICD-10-CM

## 2014-01-03 DIAGNOSIS — F3130 Bipolar disorder, current episode depressed, mild or moderate severity, unspecified: Secondary | ICD-10-CM

## 2014-01-03 DIAGNOSIS — F319 Bipolar disorder, unspecified: Secondary | ICD-10-CM

## 2014-01-03 DIAGNOSIS — E119 Type 2 diabetes mellitus without complications: Secondary | ICD-10-CM

## 2014-01-03 LAB — HEPATIC FUNCTION PANEL
ALK PHOS: 30 U/L — AB (ref 39–117)
ALT: 40 U/L (ref 0–53)
AST: 31 U/L (ref 0–37)
Albumin: 4.4 g/dL (ref 3.5–5.2)
BILIRUBIN DIRECT: 0 mg/dL (ref 0.0–0.3)
TOTAL PROTEIN: 7.6 g/dL (ref 6.0–8.3)
Total Bilirubin: 0.4 mg/dL (ref 0.3–1.2)

## 2014-01-03 LAB — CBC WITH DIFFERENTIAL/PLATELET
BASOS ABS: 0 10*3/uL (ref 0.0–0.1)
Basophils Relative: 0.5 % (ref 0.0–3.0)
EOS ABS: 0.2 10*3/uL (ref 0.0–0.7)
Eosinophils Relative: 3 % (ref 0.0–5.0)
HCT: 38.3 % — ABNORMAL LOW (ref 39.0–52.0)
Hemoglobin: 12.7 g/dL — ABNORMAL LOW (ref 13.0–17.0)
LYMPHS PCT: 28.1 % (ref 12.0–46.0)
Lymphs Abs: 2.2 10*3/uL (ref 0.7–4.0)
MCHC: 33 g/dL (ref 30.0–36.0)
MCV: 91.5 fl (ref 78.0–100.0)
Monocytes Absolute: 0.6 10*3/uL (ref 0.1–1.0)
Monocytes Relative: 7.2 % (ref 3.0–12.0)
Neutro Abs: 4.9 10*3/uL (ref 1.4–7.7)
Neutrophils Relative %: 61.2 % (ref 43.0–77.0)
Platelets: 246 10*3/uL (ref 150.0–400.0)
RBC: 4.19 Mil/uL — AB (ref 4.22–5.81)
RDW: 13.8 % (ref 11.5–14.6)
WBC: 7.9 10*3/uL (ref 4.5–10.5)

## 2014-01-03 LAB — MICROALBUMIN / CREATININE URINE RATIO
Creatinine,U: 86.5 mg/dL
Microalb Creat Ratio: 1 mg/g (ref 0.0–30.0)
Microalb, Ur: 0.9 mg/dL (ref 0.0–1.9)

## 2014-01-03 LAB — BASIC METABOLIC PANEL
BUN: 11 mg/dL (ref 6–23)
CO2: 21 mEq/L (ref 19–32)
Calcium: 9.6 mg/dL (ref 8.4–10.5)
Chloride: 108 mEq/L (ref 96–112)
Creatinine, Ser: 1.1 mg/dL (ref 0.4–1.5)
GFR: 84.35 mL/min (ref >60.00)
GLUCOSE: 92 mg/dL (ref 70–99)
Potassium: 4 mEq/L (ref 3.5–5.1)
Sodium: 137 mEq/L (ref 135–145)

## 2014-01-03 LAB — HEMOGLOBIN A1C: HEMOGLOBIN A1C: 6.7 % — AB (ref 4.6–6.5)

## 2014-01-03 LAB — TSH: TSH: 2.08 u[IU]/mL (ref 0.35–5.50)

## 2014-01-03 NOTE — Progress Notes (Signed)
   Subjective:    Patient ID: Cody Short, male    DOB: 07-18-1976, 38 y.o.   MRN: [id number]  HPI Cody Short is a 38 year old single male nonsmoker who comes in today for evaluation of multiple issues  He takes 2.5 mg of glipizide daily along with metformin 1000 mg twice a day. His blood sugars are ranging from 80-110 fasting in the morning.  No hypoglycemia  He changed his diet and has decreased his carbohydrate intake. He's walking more. His weight is down to 228.  He uses Qvar for prophylaxis of asthma asymptomatic  His psychiatrist Dr. Haywood Lassoaudle has been changing his medications. He still has a lot of trouble with leg cramps.  He takes propranolol 40 mg tabs dose 2 tabs 3 times a day when necessary for tremors.   Review of Systems Review of systems otherwise negative he is now traveling with his father on business    Objective:   Physical Exam  Well-developed well-nourished male no acute distress vital signs stable he is afebrile BP 110/80      Assessment & Plan:  Diabetes type 2 check labs  Asthma under control continue current therapy  Depression followup by Dr. Haywood Lassoaudle

## 2014-01-03 NOTE — Progress Notes (Signed)
Pre visit review using our clinic review tool, if applicable. No additional management support is needed unless otherwise documented below in the visit note. 

## 2014-01-03 NOTE — Progress Notes (Signed)
   Subjective:    Patient ID: Cody Short, male    DOB: July 12, 1976, 38 y.o.   MRN: [id number]  HPI    Review of Systems     Objective:   Physical Exam        Assessment & Plan:

## 2014-01-03 NOTE — Patient Instructions (Signed)
Continue your current medications  Labs now  I'll call you and I get the report  Continue on a low carbohydrate diet  Remember to walk 30 minutes daily  Continue to check a fasting blood sugar daily in the morning

## 2014-01-06 ENCOUNTER — Telehealth: Payer: Self-pay

## 2014-01-06 NOTE — Telephone Encounter (Signed)
Relevant patient education assigned to patient using Emmi. ° °

## 2014-01-09 ENCOUNTER — Other Ambulatory Visit: Payer: Self-pay | Admitting: Family Medicine

## 2014-01-09 DIAGNOSIS — D649 Anemia, unspecified: Secondary | ICD-10-CM

## 2014-01-13 ENCOUNTER — Ambulatory Visit: Payer: Self-pay | Admitting: Family

## 2014-01-13 ENCOUNTER — Other Ambulatory Visit: Payer: Self-pay | Admitting: *Deleted

## 2014-01-13 ENCOUNTER — Telehealth: Payer: Self-pay | Admitting: Family Medicine

## 2014-01-13 MED ORDER — ONDANSETRON HCL 4 MG PO TABS: 4.0000 mg | ORAL_TABLET | Freq: Three times a day (TID) | ORAL | Status: DC | PRN

## 2014-01-13 NOTE — Telephone Encounter (Signed)
Patient Information:  Caller Name: Cody Short  Phone: [phone]  Patient: Cody Short, Cody Short  Gender: Male  DOB: [birth year]  Age: 38 Years  PCP: Kelle Dartingodd, [facility](Family Practice)  Office Follow Up:  Does the office need to follow up with this patient?: No  Instructions For The Office: N/A   Symptoms  Reason For Call & Symptoms: Started with nausea and vomiting at nighttime on 01/04/14 and last night he vomited > 8x- started at 10:30- last vomited at 0900 01/13/14. Stopped Seroquil one week ago and  Geodon/ Ziprisidone increaed from 40 mgs to 60 mgs BID 10 days ago. He has called Psychologist (who prescribes) and they have not returned call. He also had a bad headache last night and vomited Tylenol. Headache is mild this morning. He last voided this morning. He is taking sips of water.  No diarrhea-last bm this morning. BG = 100 last night. He did not take Metformin or other meds. Advised to recheck BG this morning. No c/o dizziness. Appointment scheduled for 1115 and advised to bring medications and log of Blood glucose recordings.   Reviewed Health History In EMR: Yes  Reviewed Medications In EMR: Yes  Reviewed Allergies In EMR: Yes  Reviewed Surgeries / Procedures: Yes  Date of Onset of Symptoms: 01/04/2014  Treatments Tried: Bradd CanaryWaladryl Allergy  Treatments Tried Worked: No  Guideline(s) Used:  Vomiting  Disposition Per Guideline:   See Today in Office  Reason For Disposition Reached:   Vomiting lasts > 48 hours  Advice Given:  Clear Liquids:  Sip water or a rehydration drink (e.g., Gatorade or Powerade).  Other options: 1/2 strength flat lemon-lime soda or ginger ale.  After 4 hours without vomiting, increase the amount.  For Non-stop Vomiting, Try Sleeping:  Try to go to sleep (Reason: sleep often empties the stomach and relieves the need to vomit).  When you awaken, resume drinking liquids. Water works best initially.  Avoid Nonprescription Medicines:  Call if vomiting a  prescription medicine.  Contagiousness:  You can return to work or school after vomiting and fever are gone.  Expected Course:  Vomiting from viral gastritis usually stops in 12 to 48 hours.  If diarrhea is present, it usually lasts for several days.  People with mild dehydration can usually treat themselves at home, by drinking more liquids.  People with moderate to severe dehydration may need medical care. signs of this include very dry mouth, dizziness, weakness, and decreased urination.  Did Not See Pill in Vomit:  You should wait until the next time you are supposed to take this drug.  Call Back If:  Vomiting lasts for more than 2 days (48 hours)  Signs of dehydration occur  You have more questions  You become worse.  Patient Will Follow Care Advice:  YES  Appointment Scheduled:  01/13/2014 11:15:00 Appointment Scheduled Provider:  Adline Mangoampbell, [facility](Family Practice)

## 2014-01-14 ENCOUNTER — Encounter: Payer: Self-pay | Admitting: Family Medicine

## 2014-01-17 ENCOUNTER — Ambulatory Visit (INDEPENDENT_AMBULATORY_CARE_PROVIDER_SITE_OTHER): Payer: BC Managed Care – PPO | Admitting: Family Medicine

## 2014-01-17 ENCOUNTER — Encounter: Payer: Self-pay | Admitting: Family Medicine

## 2014-01-17 ENCOUNTER — Other Ambulatory Visit (INDEPENDENT_AMBULATORY_CARE_PROVIDER_SITE_OTHER): Payer: BC Managed Care – PPO | Admitting: Family Medicine

## 2014-01-17 VITALS — BP 122/84 | HR 64 | Temp 98.6°F | Wt 224.0 lb

## 2014-01-17 DIAGNOSIS — E119 Type 2 diabetes mellitus without complications: Secondary | ICD-10-CM

## 2014-01-17 DIAGNOSIS — R112 Nausea with vomiting, unspecified: Secondary | ICD-10-CM

## 2014-01-17 DIAGNOSIS — D649 Anemia, unspecified: Secondary | ICD-10-CM

## 2014-01-17 LAB — AMYLASE: AMYLASE: 129 U/L (ref 27–131)

## 2014-01-17 LAB — POCT URINALYSIS DIPSTICK
BILIRUBIN UA: NEGATIVE
Blood, UA: NEGATIVE
GLUCOSE UA: NEGATIVE
Ketones, UA: NEGATIVE
NITRITE UA: NEGATIVE
Protein, UA: NEGATIVE
Spec Grav, UA: 1.02
Urobilinogen, UA: 0.2
pH, UA: 7

## 2014-01-17 LAB — LIPASE: LIPASE: 65 U/L — AB (ref 11.0–59.0)

## 2014-01-17 LAB — BASIC METABOLIC PANEL
BUN: 6 mg/dL (ref 6–23)
CO2: 24 mEq/L (ref 19–32)
Calcium: 9.9 mg/dL (ref 8.4–10.5)
Chloride: 106 mEq/L (ref 96–112)
Creatinine, Ser: 1.1 mg/dL (ref 0.4–1.5)
GFR: 78.33 mL/min (ref >60.00)
Glucose, Bld: 129 mg/dL — ABNORMAL HIGH (ref 70–99)
POTASSIUM: 3.9 meq/L (ref 3.5–5.1)
Sodium: 138 mEq/L (ref 135–145)

## 2014-01-17 LAB — HEPATIC FUNCTION PANEL
ALK PHOS: 32 U/L — AB (ref 39–117)
ALT: 132 U/L — ABNORMAL HIGH (ref 0–53)
AST: 115 U/L — ABNORMAL HIGH (ref 0–37)
Albumin: 4.4 g/dL (ref 3.5–5.2)
Bilirubin, Direct: 0 mg/dL (ref 0.0–0.3)
TOTAL PROTEIN: 7.3 g/dL (ref 6.0–8.3)
Total Bilirubin: 0.4 mg/dL (ref 0.3–1.2)

## 2014-01-17 LAB — CBC WITH DIFFERENTIAL/PLATELET
BASOS ABS: 0 10*3/uL (ref 0.0–0.1)
Basophils Relative: 0.5 % (ref 0.0–3.0)
Eosinophils Absolute: 0.2 10*3/uL (ref 0.0–0.7)
Eosinophils Relative: 2.8 % (ref 0.0–5.0)
HCT: 39.3 % (ref 39.0–52.0)
HEMOGLOBIN: 12.6 g/dL — AB (ref 13.0–17.0)
LYMPHS PCT: 23.1 % (ref 12.0–46.0)
Lymphs Abs: 1.8 10*3/uL (ref 0.7–4.0)
MCHC: 32.1 g/dL (ref 30.0–36.0)
MCV: 92.4 fl (ref 78.0–100.0)
MONOS PCT: 8 % (ref 3.0–12.0)
Monocytes Absolute: 0.6 10*3/uL (ref 0.1–1.0)
Neutro Abs: 5.1 10*3/uL (ref 1.4–7.7)
Neutrophils Relative %: 65.6 % (ref 43.0–77.0)
PLATELETS: 238 10*3/uL (ref 150.0–400.0)
RBC: 4.26 Mil/uL (ref 4.22–5.81)
RDW: 13.7 % (ref 11.5–14.6)
WBC: 7.8 10*3/uL (ref 4.5–10.5)

## 2014-01-17 LAB — POCT HEMOGLOBIN: Hemoglobin: 13.3 g/dL — AB (ref 14.1–18.1)

## 2014-01-17 NOTE — Progress Notes (Signed)
   Subjective:    Patient ID: Cody Short, male    DOB: 1975-12-04, 38 y.o.   MRN: [id number]  HPI [name] is a 38 year old single male nonsmoker ?????? who comes in today for nausea and vomiting for 10 days  He states that he tapered off his syrup a month ago 10 days ago he began having nausea and vomiting no. No fever no diarrhea. He consulted with his psychiatrist Dr. Haywood Lassoaudle who did not feel like it was medicine related phenomenon. He's had no viral symptoms. The last time he vomited was 3 days ago. He's been on Zofran 4 mg 3 times a day since that time.   Review of Systems    negative Objective:   Physical Exam  Well-developed well-nourished male no acute distress vital signs stable he is afebrile examination the abdomen was soft bowel sounds are normal liver spleen and kidneys not enlarged no palpable masses.      Assessment & Plan:  Nausea vomiting x10 days......... none x3....... in the setting of underlying bipolar depression recently tapered off his Seroquel.......... clear liquids check labs

## 2014-01-17 NOTE — Progress Notes (Signed)
Pre visit review using our clinic review tool, if applicable. No additional management support is needed unless otherwise documented below in the visit note. 

## 2014-01-17 NOTE — Patient Instructions (Signed)
Change her diet to clear liquids  Zofran............ one every 6-8 hours when necessary for nausea  Continue your other medications  I will call you I gets her lab work back

## 2014-01-19 ENCOUNTER — Other Ambulatory Visit: Payer: Self-pay | Admitting: Family

## 2014-01-20 ENCOUNTER — Telehealth: Payer: Self-pay

## 2014-01-20 ENCOUNTER — Encounter: Payer: Self-pay | Admitting: *Deleted

## 2014-01-20 NOTE — Telephone Encounter (Signed)
Pt req Fleet ContrasRachel to call him about a diagnosis  that Dr. Tawanna Coolerodd gave him. Refuse triage  Wanted to speak to Fleet ContrasRachel only !!!

## 2014-01-20 NOTE — Telephone Encounter (Signed)
Spoke with patient and information sent to MyChart

## 2014-01-28 ENCOUNTER — Emergency Department (HOSPITAL_COMMUNITY)
Admission: EM | Admit: 2014-01-28 | Discharge: 2014-01-28 | Disposition: A | Discharge status: Home / Self Care | Payer: BC Managed Care – PPO | Attending: Emergency Medicine | Admitting: Emergency Medicine

## 2014-01-28 ENCOUNTER — Emergency Department (HOSPITAL_COMMUNITY): Payer: BC Managed Care – PPO

## 2014-01-28 ENCOUNTER — Encounter (HOSPITAL_COMMUNITY): Payer: Self-pay | Admitting: Emergency Medicine

## 2014-01-28 DIAGNOSIS — R109 Unspecified abdominal pain: Secondary | ICD-10-CM

## 2014-01-28 DIAGNOSIS — Z79899 Other long term (current) drug therapy: Secondary | ICD-10-CM | POA: Insufficient documentation

## 2014-01-28 DIAGNOSIS — F3289 Other specified depressive episodes: Secondary | ICD-10-CM | POA: Insufficient documentation

## 2014-01-28 DIAGNOSIS — IMO0002 Reserved for concepts with insufficient information to code with codable children: Secondary | ICD-10-CM | POA: Insufficient documentation

## 2014-01-28 DIAGNOSIS — F329 Major depressive disorder, single episode, unspecified: Secondary | ICD-10-CM | POA: Insufficient documentation

## 2014-01-28 DIAGNOSIS — E781 Pure hyperglyceridemia: Secondary | ICD-10-CM | POA: Insufficient documentation

## 2014-01-28 DIAGNOSIS — R1013 Epigastric pain: Secondary | ICD-10-CM | POA: Insufficient documentation

## 2014-01-28 DIAGNOSIS — E119 Type 2 diabetes mellitus without complications: Secondary | ICD-10-CM | POA: Insufficient documentation

## 2014-01-28 DIAGNOSIS — R197 Diarrhea, unspecified: Secondary | ICD-10-CM | POA: Insufficient documentation

## 2014-01-28 DIAGNOSIS — Z87891 Personal history of nicotine dependence: Secondary | ICD-10-CM | POA: Insufficient documentation

## 2014-01-28 DIAGNOSIS — Z872 Personal history of diseases of the skin and subcutaneous tissue: Secondary | ICD-10-CM | POA: Insufficient documentation

## 2014-01-28 DIAGNOSIS — R112 Nausea with vomiting, unspecified: Secondary | ICD-10-CM | POA: Insufficient documentation

## 2014-01-28 LAB — CBC WITH DIFFERENTIAL/PLATELET
BASOS ABS: 0 10*3/uL (ref 0.0–0.1)
BASOS PCT: 1 % (ref 0–1)
Eosinophils Absolute: 0.2 10*3/uL (ref 0.0–0.7)
Eosinophils Relative: 2 % (ref 0–5)
HEMATOCRIT: 41.7 % (ref 39.0–52.0)
Hemoglobin: 13.4 g/dL (ref 13.0–17.0)
Lymphocytes Relative: 21 % (ref 12–46)
Lymphs Abs: 1.7 10*3/uL (ref 0.7–4.0)
MCH: 29.6 pg (ref 26.0–34.0)
MCHC: 32.1 g/dL (ref 30.0–36.0)
MCV: 92.3 fL (ref 78.0–100.0)
MONO ABS: 0.5 10*3/uL (ref 0.1–1.0)
Monocytes Relative: 6 % (ref 3–12)
NEUTROS ABS: 5.6 10*3/uL (ref 1.7–7.7)
Neutrophils Relative %: 70 % (ref 43–77)
Platelets: 277 10*3/uL (ref 150–400)
RBC: 4.52 MIL/uL (ref 4.22–5.81)
RDW: 13.6 % (ref 11.5–15.5)
WBC: 7.9 10*3/uL (ref 4.0–10.5)

## 2014-01-28 LAB — URINALYSIS, ROUTINE W REFLEX MICROSCOPIC
Bilirubin Urine: NEGATIVE
Glucose, UA: NEGATIVE mg/dL
HGB URINE DIPSTICK: NEGATIVE
Ketones, ur: NEGATIVE mg/dL
LEUKOCYTES UA: NEGATIVE
Nitrite: NEGATIVE
PROTEIN: NEGATIVE mg/dL
SPECIFIC GRAVITY, URINE: 1.008 (ref 1.005–1.030)
UROBILINOGEN UA: 0.2 mg/dL (ref 0.0–1.0)
pH: 7.5 (ref 5.0–8.0)

## 2014-01-28 LAB — COMPREHENSIVE METABOLIC PANEL
ALT: 48 U/L (ref 0–53)
AST: 36 U/L (ref 0–37)
Albumin: 4.4 g/dL (ref 3.5–5.2)
Alkaline Phosphatase: 36 U/L — ABNORMAL LOW (ref 39–117)
BILIRUBIN TOTAL: 0.3 mg/dL (ref 0.3–1.2)
BUN: 7 mg/dL (ref 6–23)
CALCIUM: 10.3 mg/dL (ref 8.4–10.5)
CHLORIDE: 104 meq/L (ref 96–112)
CO2: 22 mEq/L (ref 19–32)
CREATININE: 1.5 mg/dL — AB (ref 0.50–1.35)
GFR calc Af Amer: 68 mL/min — ABNORMAL LOW (ref >90)
GFR calc non Af Amer: 59 mL/min — ABNORMAL LOW (ref >90)
Glucose, Bld: 91 mg/dL (ref 70–99)
Potassium: 4.1 mEq/L (ref 3.7–5.3)
Sodium: 138 mEq/L (ref 137–147)
Total Protein: 7.7 g/dL (ref 6.0–8.3)

## 2014-01-28 LAB — LIPASE, BLOOD: Lipase: 36 U/L (ref 11–59)

## 2014-01-28 MED ORDER — MORPHINE SULFATE 4 MG/ML IJ SOLN
4.0000 mg | Freq: Once | INTRAMUSCULAR | Status: AC
  Administered 2014-01-28: 4 mg via INTRAVENOUS
  Filled 2014-01-28: qty 1

## 2014-01-28 MED ORDER — ONDANSETRON HCL 4 MG/2ML IJ SOLN
4.0000 mg | Freq: Once | INTRAMUSCULAR | Status: AC
  Administered 2014-01-28: 4 mg via INTRAVENOUS
  Filled 2014-01-28: qty 2

## 2014-01-28 MED ORDER — SODIUM CHLORIDE 0.9 % IV BOLUS (SEPSIS)
1000.0000 mL | Freq: Once | INTRAVENOUS | Status: AC
  Administered 2014-01-28: 1000 mL via INTRAVENOUS

## 2014-01-28 MED ORDER — HYDROCODONE-ACETAMINOPHEN 5-325 MG PO TABS: 1.0000 | ORAL_TABLET | ORAL | Status: DC | PRN

## 2014-01-28 MED ORDER — ONDANSETRON 4 MG PO TBDP: 4.0000 mg | ORAL_TABLET | Freq: Three times a day (TID) | ORAL | Status: DC | PRN

## 2014-01-28 NOTE — Discharge Instructions (Signed)
Take the prescribed medication as directed. May wish to continue clear liquids.  My add in bland foods (saltine crackers, toast, etc) and progress back to normal as tolerated. Follow-up with your primary care physician. Return to the ED for new or worsening symptoms.

## 2014-01-28 NOTE — ED Provider Notes (Signed)
Medical screening examination/treatment/procedure(s) were performed by non-physician practitioner and as supervising physician I was immediately available for consultation/collaboration.   EKG Interpretation None        Cody Kulik RancoGlynn Octaveur, MD 01/28/14 [phone]

## 2014-01-28 NOTE — ED Provider Notes (Signed)
CSN: [id number]     Arrival date & time 01/28/14  1218 History   First MD Initiated Contact with Patient 01/28/14 1252     Chief Complaint  Patient presents with  . Abdominal Pain  . Nausea  . Emesis     (Consider location/radiation/quality/duration/timing/severity/associated sxs/prior Treatment) The history is provided by the patient and medical records.   This is a 38 year old male with past medical history significant for diabetes, hyper-triglyceridemia, depression, presenting to the ED for abdominal pain, nausea, and vomiting intermittently over the past 3 weeks. Patient was seen by his primary care physician, Dr. Tawanna Cooler, last week, diagnosed with a mild case of pancreatitis. He was put onto a clear liquid diet with some improvement of sx.  Pain localized to his epigastrium, described as aching with intermittent sharp sensations. He denies any fevers or chills.  No urinary symptoms. Some recent diarrhea, nonbloody.  No recent antibiotics use.  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  Patient also states his psychiatrist has been changing his depression medications, previously on Seroquel, now on Geodon which is causing him some additional nausea and vomiting.  VS stable on arrival.  Past Medical History  Diagnosis Date  . 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 DM w/o Complication Type II 07/05/2007  . HYPERTRIGLYCERIDEMIA 10/14/2010  . Nocturia 07/04/2010  . PILAR CYST 08/03/2007  . TOBACCO ABUSE, HX OF 07/31/2009   No past surgical history on file. Family History  Problem Relation Age of Onset  . Diabetes Mother   . Diabetes Father    History  Substance Use Topics  . Smoking status: Former Games developer  . Smokeless tobacco: Not on file  . Alcohol Use: No    Review of Systems  Gastrointestinal: Positive for nausea, vomiting, abdominal pain and diarrhea.  All other systems reviewed and are negative.      Allergies   Divalproex sodium; Methylphenidate hcl; Oxycodone hcl; and Paroxetine  Home Medications   Current Outpatient Rx  Name  Route  Sig  Dispense  Refill  . beclomethasone (QVAR) 40 MCG/ACT inhaler   Inhalation   Inhale 1 puff into the lungs 2 (two) times daily.         . fenofibrate (TRICOR) 145 MG tablet   Oral   Take 145 mg by mouth daily.         Marland Kitchen glipiZIDE (GLUCOTROL XL) 2.5 MG 24 hr tablet   Oral   Take 1 tablet (2.5 mg total) by mouth daily.   90 tablet   3   . lithium 300 MG capsule   Oral   Take by mouth. 1 every morning , 4 every evening         . metFORMIN (GLUCOPHAGE) 1000 MG tablet      1 by mouth twice a day   200 tablet   3   . ondansetron (ZOFRAN) 4 MG tablet      TAKE ONE TABLET BY MOUTH EVERY EIGHT HOURS AS NEEDED FOR NAUSEA OR VOMITTING.   20 tablet   0     Office visit if no improvement   . propranolol (INDERAL) 40 MG tablet   Oral   Take 40 mg by mouth 3 (three) times daily.          . ziprasidone (GEODON) 60 MG capsule   Oral   Take 60 mg by mouth 2 (two) times daily with a meal.  BP 138/81  Pulse 62  Temp(Src) 98.6 F (37 C) (Oral)  Resp 16  SpO2 97%  Physical Exam  Nursing note and vitals reviewed. Constitutional: He is oriented to person, place, and time. He appears well-developed and well-nourished. No distress.  Lying comfortably in bed, texting on cell phone  HENT:  Head: Normocephalic and atraumatic.  Mouth/Throat: Oropharynx is clear and moist.  Lips cracked, mildly dry mucous membranes  Eyes: Conjunctivae and EOM are normal. Pupils are equal, round, and reactive to light.  Neck: Normal range of motion.  Cardiovascular: Normal rate, regular rhythm and normal heart sounds.   Pulmonary/Chest: Effort normal and breath sounds normal.  Abdominal: Soft. Bowel sounds are normal. There is tenderness in the epigastric area. There is no CVA tenderness, no tenderness at McBurney's point and negative Murphy's sign.   Abdomen soft, nondistended, largely non-tender with minimal discomfort in the epigastrium  Musculoskeletal: Normal range of motion.  Neurological: He is alert and oriented to person, place, and time.  Skin: Skin is warm and dry.  Psychiatric: He has a normal mood and affect.    ED Course  Procedures (including critical care time) Labs Review Labs Reviewed  COMPREHENSIVE METABOLIC PANEL - Abnormal; Notable for the following:    Creatinine, Ser 1.50 (*)    Alkaline Phosphatase 36 (*)    GFR calc non Af Amer 59 (*)    GFR calc Af Amer 68 (*)    All other components within normal limits  CBC WITH DIFFERENTIAL  LIPASE, BLOOD  URINALYSIS, ROUTINE W REFLEX MICROSCOPIC   Imaging Review US Abdomen Complete  01/28/2014   CLINICAL DATA:  Abdominal pain and nausea  EXAM: ULTRASOUND ABDOMEN COMPLETE  COMPARISON:  CT abdomen and pelvis Mar 23, 2007  FINDINGS: Gallbladder:  No gallstones or wall thickening visualized. There is no pericholecystic fluid. No sonographic Murphy sign noted.  Common bile duct:  Diameter: 3 mm. There is no intrahepatic, common hepatic, or common bile duct dilatation.  Liver:  No focal lesion identified. Echotexture is somewhat coarse and increased.  IVC:  No abnormality visualized.  Pancreas:  Visualized portion unremarkable. Portions of the pancreas are obscured by gas.  Spleen:  Size and appearance within normal limits.  Right Kidney:  Length: 12.2 cm. Echogenicity within normal limits. No mass or hydronephrosis visualized.  Left Kidney:  Length: 13.1 cm. Echogenicity within normal limits. No hydronephrosis visualized. There is a simple cyst in the lower pole region measuring 0.7 x 0.8 x 0.8 cm. No other mass lesion identified.  Abdominal aorta:  No aneurysm visualized.  Other findings:  No demonstrable ascites.  IMPRESSION: Increased echogenicity in the liver, most likely due to hepatic steatosis. While no focal liver lesions are identified, it must be cautioned that the  sensitivity of ultrasound for focal liver lesions is diminished in this circumstance.  Portions of pancreas are obscured by gas. Visualized portions of pancreas appear normal.  Small simple cyst in the left kidney.  Study otherwise unremarkable.   Electronically Signed   By: Bretta Bang M.D.   On: 01/28/2014 15:24     EKG Interpretation None      MDM   Final diagnoses:  Abdominal pain  Nausea & vomiting   Labs as above, largely unremarkable with exception of slight bump in creatinine, likely due to dehydration from bouts of vomiting and diarrhea. Lipase within normal limits. Given patient's history, will obtain abdominal ultrasound. IV fluid bolus, pain meds, and anti-medics given. We'll reassess.  Ultrasound  negative for acute findings, fatty liver noted.  Symptoms greatly improved after morphine and Zofran. Repeat abdominal exam is benign. Patient has had no episodes of active vomiting while in the emergency department.  He remained afebrile and overall nontoxic appearing. This time have low suspicion for acute or surgical abdomen including but not limited to acute cholecystitis, SBO, bowel perforation, appendicitis, etc.  Pt will be discharged with close PCP FU.  Rx vicodin and zofran.  Advised may wish to continue clear liquid diet for additional few days, progress back to normal as tolerated.  Discussed plan with pt and father, they acknowledged understanding and agreed with plan of care.  Strict return precautions advised.  Garlon HatchetLisa M Kristen Fromm, PA-C 01/28/14 [phone]

## 2014-01-28 NOTE — ED Notes (Signed)
Pt was dx w/ pancreatitis after having had abd pain and NV x 3 wks.  States he has had increasing pain and nausea/vomiting.  Central abd pain.

## 2014-02-02 ENCOUNTER — Telehealth: Payer: Self-pay | Admitting: Internal Medicine

## 2014-02-02 ENCOUNTER — Encounter: Payer: Self-pay | Admitting: Family Medicine

## 2014-02-02 ENCOUNTER — Ambulatory Visit (INDEPENDENT_AMBULATORY_CARE_PROVIDER_SITE_OTHER): Payer: BC Managed Care – PPO | Admitting: Family Medicine

## 2014-02-02 VITALS — BP 120/90 | Temp 98.8°F | Wt 214.0 lb

## 2014-02-02 DIAGNOSIS — K859 Acute pancreatitis without necrosis or infection, unspecified: Secondary | ICD-10-CM

## 2014-02-02 LAB — HEPATIC FUNCTION PANEL
ALK PHOS: 31 U/L — AB (ref 39–117)
ALT: 39 U/L (ref 0–53)
AST: 41 U/L — ABNORMAL HIGH (ref 0–37)
Albumin: 4.3 g/dL (ref 3.5–5.2)
BILIRUBIN TOTAL: 0.5 mg/dL (ref 0.3–1.2)
Bilirubin, Direct: 0 mg/dL (ref 0.0–0.3)
Total Protein: 7.5 g/dL (ref 6.0–8.3)

## 2014-02-02 LAB — LIPID PANEL
CHOL/HDL RATIO: 4
Cholesterol: 135 mg/dL (ref 0–200)
HDL: 35.5 mg/dL — AB (ref >39.00)
LDL CALC: 67 mg/dL (ref 0–99)
TRIGLYCERIDES: 161 mg/dL — AB (ref 0.0–149.0)
VLDL: 32.2 mg/dL (ref 0.0–40.0)

## 2014-02-02 LAB — CBC WITH DIFFERENTIAL/PLATELET
Basophils Absolute: 0.1 10*3/uL (ref 0.0–0.1)
Basophils Relative: 0.9 % (ref 0.0–3.0)
Eosinophils Absolute: 0.2 10*3/uL (ref 0.0–0.7)
Eosinophils Relative: 2.8 % (ref 0.0–5.0)
HCT: 40.7 % (ref 39.0–52.0)
Hemoglobin: 13.3 g/dL (ref 13.0–17.0)
LYMPHS PCT: 22.4 % (ref 12.0–46.0)
Lymphs Abs: 1.5 10*3/uL (ref 0.7–4.0)
MCHC: 32.7 g/dL (ref 30.0–36.0)
MCV: 91.9 fl (ref 78.0–100.0)
MONO ABS: 0.4 10*3/uL (ref 0.1–1.0)
Monocytes Relative: 6.6 % (ref 3.0–12.0)
Neutro Abs: 4.5 10*3/uL (ref 1.4–7.7)
Neutrophils Relative %: 67.3 % (ref 43.0–77.0)
PLATELETS: 258 10*3/uL (ref 150.0–400.0)
RBC: 4.43 Mil/uL (ref 4.22–5.81)
RDW: 13.8 % (ref 11.5–14.6)
WBC: 6.7 10*3/uL (ref 4.5–10.5)

## 2014-02-02 LAB — AMYLASE: AMYLASE: 103 U/L (ref 27–131)

## 2014-02-02 LAB — LIPASE: Lipase: 29 U/L (ref 11.0–59.0)

## 2014-02-02 MED ORDER — ONDANSETRON 4 MG PO TBDP: ORAL_TABLET | ORAL | Status: DC

## 2014-02-02 MED ORDER — TRAMADOL HCL 50 MG PO TABS: ORAL_TABLET | ORAL | Status: DC

## 2014-02-02 NOTE — Telephone Encounter (Signed)
Patient is scheduled for Monday for 02/06/14 11:00.  Debra aware

## 2014-02-02 NOTE — Patient Instructions (Signed)
Continue the bland diet  Avoid fats  Tramadol 50 mg,,,,,,,,,,,,, one half to one tablet twice daily. For pain  Zofran 4 mg........ one half to one tablet twice a day when necessary for nausea  We will get you set up a consult with Dr. Concha SeGassner for further evaluation  As you lose weight I would check a blood sugar daily. If we begin to see low blood sugars below 80 that we want to cut your metformin dose in half

## 2014-02-02 NOTE — Progress Notes (Signed)
Pre visit review using our clinic review tool, if applicable. No additional management support is needed unless otherwise documented below in the visit note. 

## 2014-02-02 NOTE — Progress Notes (Signed)
   Subjective:    Patient ID: Cody Short, male    DOB: 1976-07-30, 38 y.o.   MRN: [id number]  HPI Cody Short is a 38 year old single male who comes in today accompanied by his mother for followup of pancreatitis  We had seen him at the end of March because he had a 3 week history of not feeling well some vague abdominal pain. Labs are normal except for his lipase and amylase were elevated. He went emerge from on the fourth and a complete diagnostic workup which was negative. He was sent home on liquids Zofran and Vicodin. He comes in today saying he feels a lot better he still queasy he start a bland diet he would like to lose more weight. He's lost 14 pounds. Goal is to get down to 180-190.  Historically had a very severe bout of pancreatitis years ago that required hospitalizations. He's a nondrinker. He is on psychiatric medications which am always concerned about may trigger pancreatitis   Review of Systems Review of systems negative    Objective:   Physical Exam  Well-developed well-nourished male no acute distress vital signs stable he is afebrile      Assessment & Plan:  Pancreatitis question etiology......Marland Kitchen. plan GI consult  Diabetes type 2 at goal.....Marland Kitchen. continue current therapy........ continue weight loss.

## 2014-02-06 ENCOUNTER — Ambulatory Visit: Payer: BC Managed Care – PPO | Admitting: Gastroenterology

## 2014-02-07 ENCOUNTER — Ambulatory Visit (INDEPENDENT_AMBULATORY_CARE_PROVIDER_SITE_OTHER): Payer: BC Managed Care – PPO | Admitting: Gastroenterology

## 2014-02-07 ENCOUNTER — Encounter: Payer: Self-pay | Admitting: Gastroenterology

## 2014-02-07 DIAGNOSIS — R1013 Epigastric pain: Secondary | ICD-10-CM | POA: Insufficient documentation

## 2014-02-07 DIAGNOSIS — R110 Nausea: Secondary | ICD-10-CM

## 2014-02-07 DIAGNOSIS — Z8719 Personal history of other diseases of the digestive system: Secondary | ICD-10-CM

## 2014-02-07 MED ORDER — OMEPRAZOLE 40 MG PO CPDR: 40.0000 mg | DELAYED_RELEASE_CAPSULE | Freq: Every day | ORAL | Status: DC

## 2014-02-07 NOTE — Patient Instructions (Addendum)
Slowly increase diet as tolerated  We have sent the following medications to your pharmacy for you to pick up at your convenience: Omeprazole 40 mg, please take one capsule by mouth once daily  _____________________________________________________________________________________________________  You have been scheduled for a CT scan of the abdomen at [facility] ([address]---this is in the same building as Press photographer).   You are scheduled on 02-10-14 at 11 am. You should arrive 15 minutes prior to your appointment time for registration. Please follow the written instructions below on the day of your exam:  WARNING: IF YOU ARE ALLERGIC TO IODINE/X-RAY DYE, PLEASE NOTIFY RADIOLOGY IMMEDIATELY AT [phone]! YOU WILL BE GIVEN A 13 HOUR PREMEDICATION PREP.  1) Do not eat or drink anything after 7 am (4 hours prior to your test) 2) You have been given 1 bottle of oral contrast to drink. The solution may taste better if refrigerated, but do NOT add ice or any other liquid to this solution. Shake well before drinking.    Drink 1 bottle of contrast @ 10 am (1 hours prior to your exam)   You may take any medications as prescribed with a small amount of water except for the following: Metformin, Glucophage, Glucovance, Avandamet, Riomet, Fortamet, Actoplus Met, Janumet, Glumetza or Metaglip. The above medications must be held the day of the exam AND 48 hours after the exam.  The purpose of you drinking the oral contrast is to aid in the visualization of your intestinal tract. The contrast solution may cause some diarrhea. Before your exam is started, you will be given a small amount of fluid to drink. Depending on your individual set of symptoms, you may also receive an intravenous injection of x-ray contrast/dye. Plan on being at [facility] for 30 minutes or long, depending on the type of exam you are having performed.  This test typically takes 30-45 minutes to  complete.  If you have any questions regarding your exam or if you need to reschedule, you may call the CT department at [phone] between the hours of 8:00 am and 5:00 pm, Monday-Friday.  ________________________________________________________________________

## 2014-02-07 NOTE — Progress Notes (Signed)
02/07/2014 Cody Short [id number] [birth year]   HISTORY OF PRESENT ILLNESS:  This is a 38 year old male who is new to our practice and referred here by his PCP, Dr. Tawanna Coolerodd. He is here today with his mother.  The patient has a history of pancreatitis as seen by CT scan imaging in 2008, which he says was contributed to his Depakote use at that time. Approximately 3 weeks ago the patient developed some acute epigastric and upper abdominal pain with associated nausea and vomiting.  He says that the pain was in the same location as when he had pancreatitis previously, however, was not as severe. The nausea and vomiting was quite significant. He saw his PCP and labs were checked including a lipase, which was slightly elevated at 65.  Amylase was normal.  His AST was elevated at that time at 115 and ALT was elevated at 132. Alkaline phosphatase and total bilirubin were normal. This elevation was fairly isolated and LFTs have since returned to normal.  Recheck of lipase just 5 days ago showed that it had returned to normal as well.  CT scan was not ordered, but ultrasound was performed and showed the following:  "IMPRESSION:  Increased echogenicity in the liver, most likely due to hepatic steatosis. While no focal liver lesions are identified, it must be cautioned that the sensitivity of ultrasound for focal liver lesions is diminished in this circumstance.  Portions of pancreas are obscured by gas. Visualized portions of pancreas appear normal.  Small simple cyst in the left kidney.  Study otherwise unremarkable."  It was also noted that CBD was 3 mm and there were no gallstones, gallbladder wall thickening, or pericholecystic fluid.  He was advised to follow a clear liquid diet advanced to low fat diet and asked to follow-up with our office as an outpatient.  He denies ETOH use.  Triglycerides were 161 recently (although they were 509 just 10 months ago).  His pain has significantly improved, but he  still has some nausea for which he is taking Zofran prn with relief.  He is tolerating a bland diet.  He denies NSAID use.   Past Medical History  Diagnosis Date  . 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 DM w/o Complication Type II 07/05/2007  . HYPERTRIGLYCERIDEMIA 10/14/2010  . Nocturia 07/04/2010  . PILAR CYST 08/03/2007  . TOBACCO ABUSE, HX OF 07/31/2009  . Bipolar affective disorder    Past Surgical History  Procedure Laterality Date  . Pilar cystectomy      reports that he has quit smoking. He has never used smokeless tobacco. He reports that he does not drink alcohol or use illicit drugs. family history includes Diabetes in his father and mother; Melanoma in his maternal uncle. There is no history of Colon cancer, Stomach cancer, Pancreatitis, Heart disease, Kidney disease, or Liver disease. Allergies  Allergen Reactions  . Divalproex Sodium Other (See Comments)    unknown  . Methylphenidate Hcl     Aggravate bipolar  . Oxycodone Hcl     REACTION: hallucinations  . Paroxetine     Aggravate bipolar disorder      Outpatient Encounter Prescriptions as of 02/07/2014  Medication Sig  . beclomethasone (QVAR) 40 MCG/ACT inhaler Inhale 1 puff into the lungs 2 (two) times daily.  . fenofibrate (TRICOR) 145 MG tablet Take 145 mg by mouth daily.  Marland Kitchen. glipiZIDE (GLUCOTROL XL) 2.5 MG 24 hr tablet Take 1 tablet (2.5 mg total) by mouth daily.  Marland Kitchen. lithium 300 MG capsule Take  by mouth. 1 every morning , 4 every evening  . metFORMIN (GLUCOPHAGE) 1000 MG tablet 1 by mouth twice a day  . ondansetron (ZOFRAN ODT) 4 MG disintegrating tablet One half to one tablet twice a day when necessary for nausea  . propranolol (INDERAL) 40 MG tablet Take 40 mg by mouth 3 (three) times daily.   . traMADol (ULTRAM) 50 MG tablet One half to one tablet twice daily when necessary for pain  . ziprasidone (GEODON) 60 MG capsule Take 60 mg by mouth 2 (two) times daily with a meal.  . omeprazole (PRILOSEC) 40 MG capsule Take 1  capsule (40 mg total) by mouth daily.  . [DISCONTINUED] HYDROcodone-acetaminophen (NORCO/VICODIN) 5-325 MG per tablet Take 1 tablet by mouth every 4 (four) hours as needed.     REVIEW OF SYSTEMS  : All other systems reviewed and negative except where noted in the History of Present Illness.   PHYSICAL EXAM: There were no vitals taken for this visit. General: Well developed white male in no acute distress Head: Normocephalic and atraumatic Eyes:  Sclerae anicteric, conjunctiva pink. Ears: Normal auditory acuity.  Lungs: Clear throughout to auscultation Heart: Regular rate and rhythm Abdomen: Soft, non-distended.  Normal bowel sounds.  Minimal epigastric TTP without R/R/G. Musculoskeletal: Symmetrical with no gross deformities  Skin: No lesions on visible extremities Extremities: No edema  Neurological: Alert oriented x 4, grossly non-focal Psychological:  Alert and cooperative. Normal mood and flat affect  ASSESSMENT AND PLAN: -Epigastric abdominal pain and nausea in a 38 year old male with history of pancreatitis in 2008 thought to medication induced from depakote.  On this occurrence he says that the pain is much less but in the same location.  Lipase only elevated slightly at 65 and has since returned to normal.  Also had bump in LFT's (transaminases only with AST 115 and ALT at 132) on one occasion at the start of his pain three weeks ago.  Ultrasound shows no gallstones or CBD dilation and visualized portions of the pancreas were normal.  Unsure if this episode of pain was actually pancreatitis or not.  No ETOH use and triglycerides not extremely elevated.  ? If he could have ulcer disease or other etiologies.  Symptoms now improving, but will check CT scan of the abdomen to see if there is any residual signs of pancreatitis.  Will start him on daily PPI therapy, omeprazole 40 mg daily, empirically for now.  Continue Zofran prn.  Continue to advance diet slowly as tolerated.

## 2014-02-08 NOTE — Progress Notes (Signed)
i agree with the above plan,  Not clear if he had acute pancreatitis this time.

## 2014-02-10 ENCOUNTER — Ambulatory Visit (INDEPENDENT_AMBULATORY_CARE_PROVIDER_SITE_OTHER)
Admission: RE | Admit: 2014-02-10 | Discharge: 2014-02-10 | Disposition: A | Discharge status: Home / Self Care | Payer: BC Managed Care – PPO | Source: Ambulatory Visit | Attending: Gastroenterology | Admitting: Gastroenterology

## 2014-02-10 DIAGNOSIS — R110 Nausea: Secondary | ICD-10-CM

## 2014-02-10 DIAGNOSIS — Z8719 Personal history of other diseases of the digestive system: Secondary | ICD-10-CM

## 2014-02-10 MED ORDER — IOHEXOL 300 MG/ML  SOLN
100.0000 mL | Freq: Once | INTRAMUSCULAR | Status: AC | PRN
  Administered 2014-02-10: 100 mL via INTRAVENOUS

## 2014-02-16 ENCOUNTER — Emergency Department (HOSPITAL_COMMUNITY)
Admission: EM | Admit: 2014-02-16 | Discharge: 2014-02-16 | Disposition: A | Discharge status: Home / Self Care | Payer: BC Managed Care – PPO | Source: Home / Self Care | Attending: Family Medicine | Admitting: Family Medicine

## 2014-02-16 ENCOUNTER — Encounter (HOSPITAL_COMMUNITY): Payer: Self-pay | Admitting: Emergency Medicine

## 2014-02-16 ENCOUNTER — Telehealth: Payer: Self-pay | Admitting: Gastroenterology

## 2014-02-16 DIAGNOSIS — R1110 Vomiting, unspecified: Secondary | ICD-10-CM

## 2014-02-16 DIAGNOSIS — F1210 Cannabis abuse, uncomplicated: Secondary | ICD-10-CM

## 2014-02-16 DIAGNOSIS — R1115 Cyclical vomiting syndrome unrelated to migraine: Secondary | ICD-10-CM

## 2014-02-16 LAB — POCT I-STAT, CHEM 8
BUN: 12 mg/dL (ref 6–23)
CREATININE: 1.2 mg/dL (ref 0.50–1.35)
Calcium, Ion: 1.31 mmol/L — ABNORMAL HIGH (ref 1.12–1.23)
Chloride: 106 mEq/L (ref 96–112)
GLUCOSE: 112 mg/dL — AB (ref 70–99)
HEMATOCRIT: 49 % (ref 39.0–52.0)
HEMOGLOBIN: 16.7 g/dL (ref 13.0–17.0)
Potassium: 4.4 mEq/L (ref 3.7–5.3)
Sodium: 142 mEq/L (ref 137–147)
TCO2: 25 mmol/L (ref 0–100)

## 2014-02-16 MED ORDER — ONDANSETRON 8 MG PO TBDP: 8.0000 mg | ORAL_TABLET | Freq: Three times a day (TID) | ORAL | Status: DC | PRN

## 2014-02-16 NOTE — ED Provider Notes (Signed)
CSN: [id number]     Arrival date & time 02/16/14  1457 History   First MD Initiated Contact with Patient 02/16/14 1531     Chief Complaint  Patient presents with  . Emesis   (Consider location/radiation/quality/duration/timing/severity/associated sxs/prior Treatment) HPI Comments: 38 year old male presents complaining of vomiting. He has had frequent vomiting, on and off, since February. He has been seen multiple times by his primary care physician and in the emergency department and also has been seen by gastroenterology. It was assumed that he had a recurrence of pancreatitis, he had pancreatitis in the past related to Depakote. He is no longer on Depakote. His labs have been and have remained normal. He had a CT scan which did not reveal any signs of pancreatitis or any other acute abnormality. He was doing okay for the past week but then began vomiting again. He vomited many times last minute, despite taking Zofran. He called the gastroenterologist and they told him to come to the emergency room or to come here. He was feeling nauseous all morning but this got better after he did the shower. He has not had any more nausea since his shower. He does admit to smoking excessive amounts of marijuana. He denies any abdominal pain at this time. He denies any bloody or bilious vomiting. He has no diarrhea. He has no other symptoms whatsoever. He is not feeling dehydrated or dizzy.   Past Medical History  Diagnosis Date  . 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 DM w/o Complication Type II 07/05/2007  . HYPERTRIGLYCERIDEMIA 10/14/2010  . Nocturia 07/04/2010  . PILAR CYST 08/03/2007  . TOBACCO ABUSE, HX OF 07/31/2009  . Bipolar affective disorder    Past Surgical History  Procedure Laterality Date  . Pilar cystectomy     Family History  Problem Relation Age of Onset  . Diabetes Mother   . Diabetes Father   . Colon cancer Neg Hx   . Stomach cancer Neg Hx   . Melanoma Maternal Uncle   . Pancreatitis Neg Hx    . Heart disease Neg Hx   . Kidney disease Neg Hx   . Liver disease Neg Hx    History  Substance Use Topics  . Smoking status: Former Games developermoker  . Smokeless tobacco: Never Used  . Alcohol Use: No     Comment: none at all    Review of Systems  Gastrointestinal: Positive for nausea and vomiting. Negative for abdominal pain, diarrhea, constipation, blood in stool, abdominal distention, anal bleeding and rectal pain.  All other systems reviewed and are negative.   Allergies  Divalproex sodium; Methylphenidate hcl; Oxycodone hcl; and Paroxetine  Home Medications   Prior to Admission medications   Medication Sig Start Date End Date Taking? Authorizing Provider  beclomethasone (QVAR) 40 MCG/ACT inhaler Inhale 1 puff into the lungs 2 (two) times daily. 04/25/13   Roderick PeeJeffrey A Todd, MD  fenofibrate (TRICOR) 145 MG tablet Take 145 mg by mouth daily. 04/07/13   Roderick PeeJeffrey A Todd, MD  glipiZIDE (GLUCOTROL XL) 2.5 MG 24 hr tablet Take 1 tablet (2.5 mg total) by mouth daily. 06/21/13   Gordy SaversPeter F Kwiatkowski, MD  lithium 300 MG capsule Take by mouth. 1 every morning , 4 every evening    Historical Provider, MD  metFORMIN (GLUCOPHAGE) 1000 MG tablet 1 by mouth twice a day 05/09/13   Roderick PeeJeffrey A Todd, MD  omeprazole (PRILOSEC) 40 MG capsule Take 1 capsule (40 mg total) by mouth daily. 02/07/14   Shanda BumpsJessica D. Zehr,  PA-C  ondansetron (ZOFRAN ODT) 4 MG disintegrating tablet One half to one tablet twice a day when necessary for nausea 02/02/14   Roderick Pee, MD  ondansetron (ZOFRAN ODT) 8 MG disintegrating tablet Take 1 tablet (8 mg total) by mouth every 8 (eight) hours as needed for nausea or vomiting. 02/16/14   Graylon Good, PA-C  propranolol (INDERAL) 40 MG tablet Take 40 mg by mouth 3 (three) times daily.  09/18/12   Historical Provider, MD  traMADol Janean Sark) 50 MG tablet One half to one tablet twice daily when necessary for pain 02/02/14   Roderick Pee, MD  ziprasidone (GEODON) 60 MG capsule Take 60 mg by mouth 2  (two) times daily with a meal.    Historical Provider, MD   BP 148/104  Pulse 75  Temp(Src) 98.5 F (36.9 C) (Oral)  Resp 16  SpO2 100% Physical Exam  Nursing note and vitals reviewed. Constitutional: He is oriented to person, place, and time. He appears well-developed and well-nourished. No distress.  HENT:  Head: Normocephalic and atraumatic.  Right Ear: External ear normal.  Left Ear: External ear normal.  Nose: Nose normal.  Mouth/Throat: Oropharynx is clear and moist. No oropharyngeal exudate.  Eyes: Conjunctivae are normal. Right eye exhibits no discharge. Left eye exhibits no discharge.  Neck: Normal range of motion. Neck supple.  Cardiovascular: Normal rate, regular rhythm and normal heart sounds.   Pulmonary/Chest: Effort normal and breath sounds normal. No respiratory distress. He has no wheezes. He has no rales.  Abdominal: Soft. He exhibits no mass. There is no tenderness. There is no rebound and no guarding.  Neurological: He is alert and oriented to person, place, and time. Coordination normal.  Skin: Skin is warm and dry. No rash noted. He is not diaphoretic.  Psychiatric: He has a normal mood and affect. Judgment normal.    ED Course  Procedures (including critical care time) Labs Review Labs Reviewed  POCT I-STAT, CHEM 8 - Abnormal; Notable for the following:    Glucose, Bld 112 (*)    Calcium, Ion 1.31 (*)    All other components within normal limits    Results for orders placed during the hospital encounter of 02/16/14  POCT I-STAT, CHEM 8      Result Value Ref Range   Sodium 142  137 - 147 mEq/L   Potassium 4.4  3.7 - 5.3 mEq/L   Chloride 106  96 - 112 mEq/L   BUN 12  6 - 23 mg/dL   Creatinine, Ser 4.09  0.50 - 1.35 mg/dL   Glucose, Bld 811 (*) 70 - 99 mg/dL   Calcium, Ion 9.14 (*) 1.12 - 1.23 mmol/L   TCO2 25  0 - 100 mmol/L   Hemoglobin 16.7  13.0 - 17.0 g/dL   HCT 78.2  95.6 - 21.3 %   Imaging Review No results found.   MDM   1.  Hyperemesis   2. Marijuana abuse    Physical exam is normal. His last creatinine level prior to today was 1.5 which was a significant bump from 10 days prior to that, but his creatinine is down to 1.2. 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 I have advised him to stop smoking marijuana. He is also taking an extremely low dose of Zofran, I have increased his Zofran dosage but he needs to stop smoking so we can see if that stops the nausea and vomiting. If his symptoms are still not controlled with the higher  dose of Zofran and he continues to have vomiting, he will go to the emergency room.   Meds ordered this encounter  Medications  . ondansetron (ZOFRAN ODT) 8 MG disintegrating tablet    Sig: Take 1 tablet (8 mg total) by mouth every 8 (eight) hours as needed for nausea or vomiting.    Dispense:  20 tablet    Refill:  0    Order Specific Question:  Supervising Provider    Answer:  Clementeen GrahamOREY, EVAN, Kathie RhodesS [[id number]]      Graylon GoodZachary H Braylon Grenda, PA-C 02/16/14 2131

## 2014-02-16 NOTE — Discharge Instructions (Signed)
Marijuana Abuse and Chemical Dependency WHEN IS DRUG USE A PROBLEM? Problems related to drug use usually begin with abuse of the substance and lead to dependency.  Abuse is repeated use of a drug with recurrent and significant negative consequences. Abuse happens anytime drug use is interfering with normal living activities including:   Failure to fulfill major obligations at work, school or home (poor work Systems analyst, missing work or school and/or neglecting children and home).  Engaging in activities that are physically dangerous (driving a car or doing recreational activities such as swimming or rock climbing) while under the effects of the drug.  Recurrent drug-related legal problems (arrests for disorderly conduct or assault and battery).  Recurrent social or interpersonal problems caused or increased by the effects of the drug (arguments with family or friends, or physical fights). Dependency has two parts.   You first develop an emotional/psychological dependence. Psychological dependence develops when your mind tells you that the drug is needed. You come to believe it helps you cope with life.  This is usually followed by physical dependence which has developed when continuing increases of drugs are required to get the same feeling or "high." This may result in:  Withdrawal symptoms such as shakes or tremors.  The substance being over a longer period of time than intended.  An ongoing desire, or unsuccessful effort to, cut down or control the use.  Greater amounts of time spent getting the drug, using the drug or recovering from the effects of the drug.  Important social, work or interests and activities are given up or reduced because or drug use.  Substance is used despite knowledge of ongoing physical (ulcers) or psychological (depression) problems. SIGNS OF CHEMICAL DEPENDENCY:  Friends or family say there is a problem.  Fighting when using drugs.  Having blackouts  (not remembering what you do while using).  Feel sick from using drugs but continue using.  Lie about use or amounts of drugs used.  Need drugs to get you going.  Need drugs to relate to people or feel comfortable in social situations.  Use drugs to forget problems. A "yes" answered to any of the above signs of chemical dependency indicates there are problems. The longer the use of drugs continues, the greater the problems will become. If there is a family history of drug or alcohol use it is best not to experiment with drugs. Experimentation leads to tolerance. Addiction is followed by dependency where drugs are now needed not just to get high but to feel normal. Addiction cannot be cured but it can be stopped. This often requires outside help and the care of professionals. Treatment centers are listed in the yellow pages under: Cocaine, Narcotics, and Alcoholics anonymous. Most hospitals and clinics can refer you to a specialized care center. WHAT IS MARIJUANA? Marijuana is a plant which grows wild all over the world. The plant contains many chemicals but the active ingredient of the plant is THC (tetrahydrocannabinol). This is responsible for the "high" perceived by people using the drug. HOW IS MARIJUANA USED? Marijuana is smoked, eaten in brownies or any other food, and drank as a tea. WHAT ARE THE EFFECTS OF MARIJUANA? Marijuana is a nervous system depressant which slows the thinking process. Because of this effect, users think marijuana has a calming effect. Actually what happens is the air carrying tubules in the lung become relaxed and allow more oxygen to enter. This causes the user to feel high. The blood pressure falls so less blood  reaches the brain and the heart speeds up. As the effects wear off the user becomes depressed. Some people become very paranoid during use. They feel as though people are out to get them. Periodic use can interfere with performance at school or work.  Generally Marijuana use does not develop into a physical dependence, but it is very habit forming. Marijuana is also seen as a gateway to use of harder drugs. Strong habits such as using Marijuana, as with all drugs and addictions, can only be helped by stopping use of all chemicals. This is hard but may save your life.  OTHER HEALTH RISKS OF MARIJUANA AND DRUG USE ARE: The increased possibility of getting AIDS or hepatitis (liver inflammation).  HOW TO STAY DRUG FREE ONCE YOU HAVE QUIT USING:  Develop healthy activities and form friends who do not use drugs.  Stay away from the drug scene.  Tell the those who want you to use drugs you have other, better things to do.  Have ready excuses available about why you cannot use.  Attend 12-Step Meetings for support from other recovering people. FOR MORE HELP OR INFORMATION CONTACT YOUR LOCAL CAREGIVER, CLINIC, [place]. Document Released: 10/10/2000 Document Revised: 02/07/2013 Document Reviewed: 11/10/2007 [facility] Patient Information 2014 [place].  Cyclic Vomiting Syndrome Cyclic vomiting syndrome is a benign condition in which patients experience bouts or cycles of severe nausea and vomiting that last for hours or even days. The bouts of nausea and vomiting alternate with longer periods of no symptoms and generally good health. Cyclic vomiting syndrome occurs mostly in children, but can affect adults. CAUSES  [facility] has no known cause. Each episode is typically similar to the previous ones. The episodes tend to:   Start at about the same time of day.  Last the same length of time.  Present the same symptoms at the same level of intensity. Cyclic vomiting syndrome can begin at any age in children and adults. Cyclic vomiting syndrome usually starts between the ages of 3 and 7 years. In adults, episodes tend to occur less often than they do in children, but they last longer. Furthermore, the events or situations that trigger episodes  in adults cannot always be pinpointed as easily as they can in children. There are 4 phases of cyclic vomiting syndrome: 1. Prodrome. The prodrome phase signals that an episode of nausea and vomiting is about to begin. This phase can last from just a few minutes to several hours. This phase is often marked by belly (abdominal) pain. Sometimes taking medicine early in the prodrome phase can stop an episode in progress. However, sometimes there is no warning. A person may simply wake up in the middle of the night or early morning and begin vomiting. 2. Episode. The episode phase consists of:  Severe vomiting.  Nausea.  Gagging (retching). 3. Recovery. The recovery phase begins when the nausea and vomiting stop. Healthy color, appetite, and energy return. 4. Symptom-free interval. The symptom-free interval phase is the period between episodes when no symptoms are present. TRIGGERS Episodes can be triggered by an infection or event. Examples of triggers include:  Infections.  Colds, allergies, sinus problems, and the flu.  Eating certain foods such as chocolate or cheese.  Foods with monosodium glutamate (MSG) or preservatives.  Fast foods.  Pre-packaged foods.  Foods with low nutritional value (junk foods).  Overeating.  Eating just before going to bed.  Hot weather.  Dehydration.  Not enough sleep or poor sleep quality.  Physical  exhaustion.  Menstruation.  Motion sickness.  Emotional stress (school or home difficulties).  Excitement or stress. SYMPTOMS  The main symptoms of cyclic vomiting syndrome are:  Severe vomiting.  Nausea.  Gagging (retching). Episodes usually begin at night or the first thing in the morning. Episodes may include vomiting or retching up to 5 or 6 times an hour during the worst of the episode. Episodes usually last anywhere from 1 to 4 days. Episodes can last for up to 10 days. Other symptoms  include:  Paleness.  Exhaustion.  Listlessness.  Abdominal pain.  Loose stools or diarrhea. Sometimes the nausea and vomiting are so severe that a person appears to be almost unconscious. Sensitivity to light, headache, fever, dizziness, may also accompany an episode. In addition, the vomiting may cause drooling and excessive thirst. Drinking water usually leads to more vomiting, though the water can dilute the acid in the vomit, making the episode a little less painful. Continuous vomiting can lead to dehydration, which means that the body has lost excessive water and salts. DIAGNOSIS  Cyclic vomiting syndrome is hard to diagnose because there are no clear tests to identify it. A caregiver must diagnose cyclic vomiting syndrome by looking at symptoms and medical history. A caregiver must exclude more common diseases or disorders that can also cause nausea and vomiting. Also, diagnosis takes time because caregivers need to identify a pattern or cycle to the vomiting. TREATMENT  Cyclic vomiting syndrome cannot be cured. Treatment varies, but people with cyclic vomiting syndrome should get plenty of rest and sleep and take medications that prevent, stop, or lessen the vomiting episodes and other symptoms. People whose episodes are frequent and long-lasting may be treated during the symptom-free intervals in an effort to prevent or ease future episodes. The symptom-free phase is a good time to eliminate anything known to trigger an episode. For example, if episodes are brought on by stress or excitement, this period is the time to find ways to reduce stress and stay calm. If sinus problems or allergies cause episodes, those conditions should be treated. The triggers listed above should be avoided or prevented. Because of the similarities between migraine and cyclic vomiting syndrome, caregivers treat some people with severe cyclic vomiting syndrome with drugs that are also used for migraine headaches.  The drugs are designed to:  Prevent episodes.  Reduce their frequency.  Lessen their severity. HOME CARE INSTRUCTIONS Once a vomiting episode begins, treatment is supportive. It helps to stay in bed and sleep in a dark, quiet room. Severe nausea and vomiting may require hospitalization and intravenous (IV) fluids to prevent dehydration. Relaxing medications (sedatives) may help if the nausea continues. Sometimes, during the prodrome phase, it is possible to stop an episode from happening altogether. Only take over-the-counter or prescription medicines for pain, discomfort or fever as directed by your caregiver. Do not give aspirin to children. During the recovery phase, drinking water and replacing lost electrolytes (salts in the blood) are very important. Electrolytes are salts that the body needs to function well and stay healthy. Symptoms during the recovery phase can vary. Some people find that their appetites return to normal immediately, while others need to begin by drinking clear liquids and then move slowly to solid food. RELATED COMPLICATIONS The severe vomiting that defines cyclic vomiting syndrome is a risk factor for several complications:  Dehydration Vomiting causes the body to lose water quickly.  Electrolyte imbalance Vomiting also causes the body to lose the important salts it needs to  keep working properly.  Peptic esophagitis The tube that connects the mouth to the stomach (esophagus) becomes injured from the stomach acid that comes up with the vomit.  Hematemesis The esophagus becomes irritated and bleeds, so blood mixes with the vomit.  Mallory-Weiss tear The lower end of the esophagus may tear open or the stomach may bruise from vomiting or retching.  Tooth decay The acid in the vomit can hurt the teeth by corroding the tooth enamel. SEEK MEDICAL CARE IF: You have questions or problems. Document Released: 12/22/2001 Document Revised: 01/05/2012 Document Reviewed:  01/20/2011 [facility] Patient Information 2014 [place].

## 2014-02-16 NOTE — ED Notes (Addendum)
Has had episodic abdominal pain w vomiting since February . Was unable to get in to see GI provider today. Concern for ulcer vs pancreatitis . Has an appointment to see GI doctor next month

## 2014-02-16 NOTE — Telephone Encounter (Signed)
Spoke with patient and he states he has been vomiting since last night. He states he cannot keep the Zofran down(?ODT tablet) Offered OV, ED or urgent care. Spoke with Doug SouJessica Zehr, PA and she recommends Urgent care or ED if he is vomiting that much. Patient notified.

## 2014-02-17 NOTE — ED Provider Notes (Signed)
Medical screening examination/treatment/procedure(s) were performed by a resident physician or non-physician practitioner and as the supervising physician I was immediately available for consultation/collaboration.  Evan Corey, MD    Evan S Corey, MD 02/17/14 0745 

## 2014-02-21 ENCOUNTER — Telehealth: Payer: Self-pay | Admitting: Family Medicine

## 2014-02-21 NOTE — Telephone Encounter (Signed)
Rx is denied

## 2014-02-21 NOTE — Telephone Encounter (Signed)
Pt req rx ondansetron (ZOFRAN ODT) 8 MG disintegrating tablet

## 2014-02-28 ENCOUNTER — Other Ambulatory Visit: Payer: Self-pay | Admitting: Family Medicine

## 2014-02-28 ENCOUNTER — Ambulatory Visit (INDEPENDENT_AMBULATORY_CARE_PROVIDER_SITE_OTHER): Payer: BC Managed Care – PPO | Admitting: Family Medicine

## 2014-02-28 ENCOUNTER — Encounter: Payer: Self-pay | Admitting: Family Medicine

## 2014-02-28 VITALS — BP 140/90 | Temp 99.1°F | Wt 213.0 lb

## 2014-02-28 DIAGNOSIS — R112 Nausea with vomiting, unspecified: Secondary | ICD-10-CM

## 2014-02-28 DIAGNOSIS — K859 Acute pancreatitis without necrosis or infection, unspecified: Secondary | ICD-10-CM

## 2014-02-28 DIAGNOSIS — E119 Type 2 diabetes mellitus without complications: Secondary | ICD-10-CM

## 2014-02-28 MED ORDER — ONDANSETRON 4 MG PO TBDP: ORAL_TABLET | ORAL | Status: DC

## 2014-02-28 NOTE — Progress Notes (Signed)
   Subjective:    Patient ID: Cody Short, male    DOB: May 07, 1976, 38 y.o.   MRN: [id number]  HPI Cody Short is a 38 year old male single who comes in today for evaluation of multiple issues  He brings with him a emergency room note from 02/16/2014. At that time he was there for nausea and vomiting it turns out this been going on since February. He now admits to smoking excessive amounts of marijuana. There apparently is a syndrome associated with marijuana nausea and vomiting. Since he stopped the marijuana it his symptoms are basically dissipated  Weight steady 213  Blood sugar 99 fasting on metformin 1000 mg twice a day glipizide 2.5 mg daily.  He sees Dr. Haywood Lassoaudle because of a history of bipolar depression. He's on a combination of Geodon and lithium.,,,,, he now is having sleep dysfunction. Advised to contact Dr. Haywood Lassoaudle about that issue   Review of Systems Review of systems otherwise negative    Objective:   Physical Exam  Well-developed well-nourished male in no acute distress vital signs stable he is afebrile      Assessment & Plan:  Nausea and vomiting secondary to marijuana abuse  Diabetes type 2 at goal

## 2014-02-28 NOTE — Patient Instructions (Signed)
Continue your current medications  Followup with me in 6 months sooner if having problems  Zofran 8 mg........ one half tablet twice daily when necessary  Obviously no marijuana

## 2014-02-28 NOTE — Progress Notes (Signed)
Pre visit review using our clinic review tool, if applicable. No additional management support is needed unless otherwise documented below in the visit note. 

## 2014-03-08 ENCOUNTER — Telehealth: Payer: Self-pay

## 2014-03-08 NOTE — Telephone Encounter (Signed)
Relevant patient education assigned to patient using Emmi. ° °

## 2014-04-04 ENCOUNTER — Ambulatory Visit: Payer: BC Managed Care – PPO | Admitting: Gastroenterology

## 2014-06-10 ENCOUNTER — Other Ambulatory Visit: Payer: Self-pay | Admitting: Family Medicine

## 2014-06-10 ENCOUNTER — Other Ambulatory Visit: Payer: Self-pay | Admitting: Internal Medicine

## 2014-06-12 ENCOUNTER — Telehealth: Payer: Self-pay | Admitting: Family Medicine

## 2014-06-12 MED ORDER — METFORMIN HCL 1000 MG PO TABS: ORAL_TABLET | ORAL | Status: DC

## 2014-06-12 NOTE — Telephone Encounter (Signed)
New rx sent

## 2014-06-12 NOTE — Telephone Encounter (Signed)
Pharm states we called in a 40 day supply of metFORMIN (GLUCOPHAGE) 1000 MG tablet Last rx was a 90 day. Ws this an error? Pharm needs a cb. Thanks!

## 2014-06-22 ENCOUNTER — Encounter: Payer: Self-pay | Admitting: Family Medicine

## 2014-06-22 DIAGNOSIS — R21 Rash and other nonspecific skin eruption: Secondary | ICD-10-CM

## 2014-06-22 DIAGNOSIS — IMO0002 Reserved for concepts with insufficient information to code with codable children: Secondary | ICD-10-CM

## 2014-06-23 ENCOUNTER — Encounter (INDEPENDENT_AMBULATORY_CARE_PROVIDER_SITE_OTHER): Payer: Self-pay

## 2014-06-23 ENCOUNTER — Telehealth (INDEPENDENT_AMBULATORY_CARE_PROVIDER_SITE_OTHER): Payer: Self-pay

## 2014-06-23 ENCOUNTER — Ambulatory Visit (INDEPENDENT_AMBULATORY_CARE_PROVIDER_SITE_OTHER): Payer: BC Managed Care – PPO | Admitting: General Surgery

## 2014-06-23 ENCOUNTER — Encounter (INDEPENDENT_AMBULATORY_CARE_PROVIDER_SITE_OTHER): Payer: Self-pay | Admitting: General Surgery

## 2014-06-23 VITALS — BP 140/86 | HR 84 | Temp 97.0°F | Ht 69.0 in | Wt 214.2 lb

## 2014-06-23 DIAGNOSIS — L02419 Cutaneous abscess of limb, unspecified: Secondary | ICD-10-CM

## 2014-06-23 DIAGNOSIS — L03119 Cellulitis of unspecified part of limb: Secondary | ICD-10-CM

## 2014-06-23 MED ORDER — SULFAMETHOXAZOLE-TRIMETHOPRIM 400-80 MG PO TABS: 1.0000 | ORAL_TABLET | Freq: Two times a day (BID) | ORAL | Status: AC

## 2014-06-23 NOTE — Progress Notes (Signed)
Subjective:     Patient ID: Cody Short, male   DOB: [birth year], 38 y.o.   MRN: [id number]  HPI The patient is a 38 year old male who arrives secondary to a left groin cyst. The patient states that the area drained last night. He states that in there for about a week. He has no tenderness to the area. He denies any fevers or chills.  Review of Systems  Constitutional: Negative.   HENT: Negative.   Eyes: Negative.   Respiratory: Negative.   Cardiovascular: Negative.   Gastrointestinal: Negative.   Endocrine: Negative.   Neurological: Negative.        Objective:   Physical Exam  Constitutional: He is oriented to person, place, and time. He appears well-developed and well-nourished.  HENT:  Head: Normocephalic and atraumatic.  Eyes: Conjunctivae and EOM are normal. Pupils are equal, round, and reactive to light.  Neck: Normal range of motion. Neck supple.  Cardiovascular: Normal rate, regular rhythm and normal heart sounds.   Pulmonary/Chest: Effort normal and breath sounds normal.  Abdominal: Soft. Bowel sounds are normal. He exhibits no distension and no mass. There is no tenderness. There is no rebound and no guarding.  Genitourinary:     1 cm, open cyst, no erythema, no fluctuance  Musculoskeletal: Normal range of motion.  Neurological: He is alert and oriented to person, place, and time.  Skin: Skin is warm and dry.       Assessment:     38 year old male with a small abscess that self drained.     Plan:     1 I will give the pt a prescription for Bactrim for one week 2. The patient followup with this not improve or gets worse.

## 2014-06-23 NOTE — Telephone Encounter (Signed)
Pt called stating groin cyst has become painful and is now draining. Pt given appt today in urgent office.

## 2014-06-29 ENCOUNTER — Encounter (INDEPENDENT_AMBULATORY_CARE_PROVIDER_SITE_OTHER): Payer: Self-pay | Admitting: General Surgery

## 2014-07-05 ENCOUNTER — Encounter (INDEPENDENT_AMBULATORY_CARE_PROVIDER_SITE_OTHER): Payer: Self-pay | Admitting: General Surgery

## 2014-07-10 ENCOUNTER — Ambulatory Visit (INDEPENDENT_AMBULATORY_CARE_PROVIDER_SITE_OTHER): Payer: BC Managed Care – PPO | Admitting: Surgery

## 2014-07-12 ENCOUNTER — Other Ambulatory Visit: Payer: Self-pay | Admitting: Family Medicine

## 2014-08-11 ENCOUNTER — Other Ambulatory Visit: Payer: Self-pay

## 2014-08-22 ENCOUNTER — Encounter: Payer: Self-pay | Admitting: Family Medicine

## 2014-08-22 ENCOUNTER — Ambulatory Visit (INDEPENDENT_AMBULATORY_CARE_PROVIDER_SITE_OTHER): Payer: BC Managed Care – PPO | Admitting: Family Medicine

## 2014-08-22 VITALS — BP 130/90 | Temp 98.2°F | Wt 210.0 lb

## 2014-08-22 DIAGNOSIS — E139 Other specified diabetes mellitus without complications: Secondary | ICD-10-CM

## 2014-08-22 DIAGNOSIS — K625 Hemorrhage of anus and rectum: Secondary | ICD-10-CM

## 2014-08-22 DIAGNOSIS — L702 Acne varioliformis: Secondary | ICD-10-CM

## 2014-08-22 LAB — BASIC METABOLIC PANEL
BUN: 13 mg/dL (ref 6–23)
CO2: 19 mEq/L (ref 19–32)
CREATININE: 1.1 mg/dL (ref 0.4–1.5)
Calcium: 10 mg/dL (ref 8.4–10.5)
Chloride: 108 mEq/L (ref 96–112)
GFR: 82.25 mL/min (ref >60.00)
GLUCOSE: 113 mg/dL — AB (ref 70–99)
Potassium: 4.7 mEq/L (ref 3.5–5.1)
Sodium: 138 mEq/L (ref 135–145)

## 2014-08-22 LAB — HEMOGLOBIN A1C: Hgb A1c MFr Bld: 6.5 % (ref 4.6–6.5)

## 2014-08-22 NOTE — Progress Notes (Signed)
   Subjective:    Patient ID: Cody Short, male    DOB: October 02, 1976, 38 y.o.   MRN: [id number]  HPI Cody Short is a 38 year old male with underlying bipolar disorder who comes in today for evaluation of rectal bleeding, assist in his scrotum, and diabetes  He had about a couple weeks ago of diarrhea and constipation and after that had some painful bright red rectal bleeding. It seems to have dissipated now. Recent bowel movements of been normal  He also had some cysts in his scrotum all of which resolved. He had folliculitis secondary to elevated blood sugar. One of the cyst as involuted but it's still not there.  He switched to the Atkins diet because his blood sugar was 2-300. He is on metformin 1000 mg twice a day and glipizide 2.5 mg daily. He says his blood sugars now down to 100.  Bipolar disorder on medication followed by psychiatry   Review of Systems    review of systems otherwise negative Objective:   Physical Exam Well-developed well-nourished man in no acute distress vital signs stable he's afebrile examination the rectum shows no obvious external lesions. Digital rectal exam normal prostate normal stool guaiac negative  Examination scrotum shows a marble sized scarred down cystic lesion       Assessment & Plan:  Rectal bleeding secondary to internal hemorrhoid resolved over time  Cystic lesions in his scrotum folliculitis secondary to elevation of his blood sugar  Diabetes type 2 improved control with change in diet

## 2014-08-22 NOTE — Patient Instructions (Signed)
Continue good diet and exercise program for your good blood sugar control  A1c today  If he developed constipation use a stool softener  In order to prevent the cysts on your scrotum from reoccurring you must keep your blood sugar normal

## 2014-08-22 NOTE — Progress Notes (Signed)
Pre visit review using our clinic review tool, if applicable. No additional management support is needed unless otherwise documented below in the visit note. 

## 2014-08-24 ENCOUNTER — Encounter: Payer: Self-pay | Admitting: Family Medicine

## 2014-08-28 NOTE — Addendum Note (Signed)
Addended by: Kern ReapVEREEN, Camela Wich B on: 08/28/2014 12:13 PM   Modules accepted: Medications

## 2014-08-30 MED ORDER — BECLOMETHASONE DIPROPIONATE 40 MCG/ACT IN AERS: 2.0000 | INHALATION_SPRAY | Freq: Two times a day (BID) | RESPIRATORY_TRACT | Status: DC

## 2014-08-30 MED ORDER — PERPHENAZINE 8 MG PO TABS: ORAL_TABLET | ORAL | Status: DC

## 2014-08-30 NOTE — Addendum Note (Signed)
Addended by: Kern ReapVEREEN, Anyelina Claycomb B on: 08/30/2014 04:07 PM   Modules accepted: Orders

## 2014-08-31 ENCOUNTER — Ambulatory Visit (INDEPENDENT_AMBULATORY_CARE_PROVIDER_SITE_OTHER): Payer: BC Managed Care – PPO | Admitting: Family Medicine

## 2014-08-31 ENCOUNTER — Encounter: Payer: Self-pay | Admitting: Family Medicine

## 2014-08-31 ENCOUNTER — Other Ambulatory Visit: Payer: Self-pay | Admitting: *Deleted

## 2014-08-31 VITALS — BP 130/80 | Temp 98.3°F | Wt 206.0 lb

## 2014-08-31 DIAGNOSIS — L702 Acne varioliformis: Secondary | ICD-10-CM

## 2014-08-31 DIAGNOSIS — B9789 Other viral agents as the cause of diseases classified elsewhere: Secondary | ICD-10-CM

## 2014-08-31 DIAGNOSIS — E139 Other specified diabetes mellitus without complications: Secondary | ICD-10-CM

## 2014-08-31 DIAGNOSIS — J069 Acute upper respiratory infection, unspecified: Secondary | ICD-10-CM

## 2014-08-31 DIAGNOSIS — Z23 Encounter for immunization: Secondary | ICD-10-CM

## 2014-08-31 MED ORDER — BECLOMETHASONE DIPROPIONATE 40 MCG/ACT IN AERS: 2.0000 | INHALATION_SPRAY | Freq: Two times a day (BID) | RESPIRATORY_TRACT | Status: DC

## 2014-08-31 MED ORDER — HYDROCODONE-HOMATROPINE 5-1.5 MG/5ML PO SYRP: 5.0000 mL | ORAL_SOLUTION | Freq: Three times a day (TID) | ORAL | Status: DC | PRN

## 2014-08-31 MED ORDER — DOXYCYCLINE HYCLATE 100 MG PO TABS: 100.0000 mg | ORAL_TABLET | Freq: Two times a day (BID) | ORAL | Status: DC

## 2014-08-31 NOTE — Progress Notes (Signed)
   Subjective:    Patient ID: Cody Short, male    DOB: Mar 26, 1976, 38 y.o.   MRN: [id number]  HPI Cody Short is a 38 year old single male nonsmoker who comes in today for evaluation of 3 problems  He has diabetes type 2 on metformin 1000 mg twice a day and glipizide 2.5 mg daily  His blood sugar 113 A1c 6.5%. He's having episodes of hypoglycemia usually midafternoon. We'll stop the glipizide  He's had a viral infection for 3 days manifested by head congestion sore throat and cough. No fever  His acne is flared up on his back   Review of Systems Review of systems otherwise negative    Objective:   Physical Exam Well-developed well-nourished male no acute distress vital signs stable he is afebrile HEENT negative neck was supple no adenopathy lungs are clear  Skin exam shows posterior acne 4+ back       Assessment & Plan:  Diabetes type 2 with episodes of hypoglycemia normal A1c,,,,,,,, stop  Glipizide and continue metformin follow-up in 6 months  Viral syndrome.......... Treat symptomatically lots of liquids cough syrup  Acne............Marland Kitchen. Doxycycline 100 mg twice a day

## 2014-08-31 NOTE — Progress Notes (Signed)
Pre visit review using our clinic review tool, if applicable. No additional management support is needed unless otherwise documented below in the visit note. 

## 2014-08-31 NOTE — Patient Instructions (Addendum)
For your viral infection drink lots of water....... Tylenol........ 2 tabs 3 times daily when necessary,,,Hydromet 1/2-1 teaspoon at bedtime when necessary for cough and cold  Stop the glipizide  Continue the metformin one twice daily  Follow-up CPX in 6 months  Labs one week prior  Doxycycline 100 mg........... One twice daily for your acne

## 2014-09-01 ENCOUNTER — Other Ambulatory Visit: Payer: Self-pay | Admitting: Family Medicine

## 2014-09-01 MED ORDER — PERPHENAZINE 4 MG PO TABS: 4.0000 mg | ORAL_TABLET | Freq: Two times a day (BID) | ORAL | Status: DC

## 2014-09-01 NOTE — Addendum Note (Signed)
Addended by: Kern ReapVEREEN, Leiliana Foody B on: 09/01/2014 08:47 AM   Modules accepted: Orders, Medications

## 2014-09-27 ENCOUNTER — Telehealth: Payer: Self-pay | Admitting: Family Medicine

## 2014-09-27 NOTE — Telephone Encounter (Signed)
Called and spoke with pt and he states he has a bad break out on his back. Per pt he has more pimples not a rash and it is not itchy nor red. Pt had 10 pimples on his back before and now he has like 100.  Advised pt to stop the doxycycline. Pt denies any of the symptoms of an allergic reaction symptoms (swelling, itching etc).  Advised pt that if he does have any of these symptoms that he should seek medical attention immediatly.  Pls call pt before 2:30 tomorrow because he works all day. Pt states the only medication that ever worked for him was Accutane.

## 2014-09-27 NOTE — Telephone Encounter (Signed)
Patient states he was prescribed doxycycline (VIBRA-TABS) 100 MG tablet for his back and he has broken out really bad across his back.  Wants to know what to do or if he can have another RX prescribed?? Please advise.

## 2014-09-28 NOTE — Telephone Encounter (Signed)
dermatologist per Dr Tawanna Coolerodd. Left message on machine for patient.

## 2014-10-02 NOTE — Addendum Note (Signed)
Addended by: Kern ReapVEREEN, Chivas Notz B on: 10/02/2014 05:08 PM   Modules accepted: Orders

## 2014-10-05 ENCOUNTER — Encounter: Payer: Self-pay | Admitting: *Deleted

## 2015-01-05 ENCOUNTER — Other Ambulatory Visit: Payer: Self-pay | Admitting: *Deleted

## 2015-01-05 MED ORDER — METFORMIN HCL 1000 MG PO TABS: ORAL_TABLET | ORAL | Status: DC

## 2015-01-05 MED ORDER — FENOFIBRATE 145 MG PO TABS: ORAL_TABLET | ORAL | Status: DC

## 2015-02-15 ENCOUNTER — Encounter: Payer: Self-pay | Admitting: Family Medicine

## 2015-02-15 ENCOUNTER — Ambulatory Visit (INDEPENDENT_AMBULATORY_CARE_PROVIDER_SITE_OTHER): Payer: BLUE CROSS/BLUE SHIELD | Admitting: Family Medicine

## 2015-02-15 VITALS — BP 130/90 | Temp 98.4°F | Wt 213.0 lb

## 2015-02-15 DIAGNOSIS — E139 Other specified diabetes mellitus without complications: Secondary | ICD-10-CM | POA: Diagnosis not present

## 2015-02-15 MED ORDER — INSULIN ASPART 100 UNIT/ML FLEXPEN: 10.0000 [IU] | PEN_INJECTOR | Freq: Every day | SUBCUTANEOUS | Status: DC

## 2015-02-15 MED ORDER — INSULIN GLARGINE 100 UNIT/ML SOLOSTAR PEN: 10.0000 [IU] | PEN_INJECTOR | Freq: Every day | SUBCUTANEOUS | Status: DC

## 2015-02-15 MED ORDER — INSULIN PEN NEEDLE 32G X 4 MM MISC: 1.0000 | Freq: Every day | Status: DC

## 2015-02-15 NOTE — Progress Notes (Signed)
   Subjective:    Patient ID: Cody Short, male    DOB: 1976/09/08, 39 y.o.   MRN: [id number]  HPI Cody Short is a 39 year old single male nonsmoker who comes in today for follow-up of diabetes  He is on metformin 1000 mg twice a day. We had him on a small amount of glipizide 2.5 mg daily however this caused his blood sugar dropped too low. He therefore stopped the glipizide. His A1c in November was 6.5%  Over the last couple weeks she's notices blood sugar going up and now it's averaging in the 2:30 to 240 range. He's trying diet and exercise but hasn't lost any weight. His weight is 213 pounds.  His other concern is memory. He is on Trilafon 12 mg daily along with lithium one in the morning and 4 tabs in the evening. His psychiatrist is Dr. Haywood Lassoaudle. He's concerned about some memory changes. Advised him to call Dr. Haywood Lassoaudle.  We discussed all the options including diet exercise weight loss new oral medications versus insulin. He does not want to go back on glipizide. He elects to try insulin   Review of Systems Review of systems otherwise negative    Objective:   Physical Exam  Well-developed well-nourished male no acute distress vital signs stable he is afebrile      Assessment & Plan:  Diabetes type 2 not at goal....... at insulin 10 units daily....... stressed diet exercise.... Monitor blood sugar return in 3 weeks

## 2015-02-15 NOTE — Progress Notes (Signed)
Pre visit review using our clinic review tool, if applicable. No additional management support is needed unless otherwise documented below in the visit note. 

## 2015-02-15 NOTE — Patient Instructions (Signed)
Continue the metformin 1000 mg before breakfast and 1000 mg before your evening male  Insulin,,,,,,,,, 10 units at bedtime  Check a fasting blood sugar daily in the morning  Return in one week for follow-up  No carbs  Walk 30 minutes daily

## 2015-02-15 NOTE — Addendum Note (Signed)
Addended by: Kern ReapVEREEN, [place] B on: 02/15/2015 04:50 PM   Modules accepted: Orders, Medications

## 2015-02-16 ENCOUNTER — Other Ambulatory Visit: Payer: Self-pay | Admitting: *Deleted

## 2015-02-16 MED ORDER — INSULIN GLARGINE 100 UNIT/ML SOLOSTAR PEN: 10.0000 [IU] | PEN_INJECTOR | Freq: Every day | SUBCUTANEOUS | Status: DC

## 2015-02-22 ENCOUNTER — Encounter: Payer: Self-pay | Admitting: Family Medicine

## 2015-02-22 ENCOUNTER — Ambulatory Visit (INDEPENDENT_AMBULATORY_CARE_PROVIDER_SITE_OTHER): Payer: BLUE CROSS/BLUE SHIELD | Admitting: Family Medicine

## 2015-02-22 VITALS — BP 120/84 | Temp 98.3°F | Wt 213.0 lb

## 2015-02-22 DIAGNOSIS — E139 Other specified diabetes mellitus without complications: Secondary | ICD-10-CM

## 2015-02-22 LAB — HEPATIC FUNCTION PANEL
ALT: 43 U/L (ref 0–53)
AST: 30 U/L (ref 0–37)
Albumin: 4.8 g/dL (ref 3.5–5.2)
Alkaline Phosphatase: 44 U/L (ref 39–117)
BILIRUBIN DIRECT: 0.1 mg/dL (ref 0.0–0.3)
BILIRUBIN TOTAL: 0.4 mg/dL (ref 0.2–1.2)
Total Protein: 7.5 g/dL (ref 6.0–8.3)

## 2015-02-22 LAB — BASIC METABOLIC PANEL
BUN: 15 mg/dL (ref 6–23)
CHLORIDE: 106 meq/L (ref 96–112)
CO2: 24 mEq/L (ref 19–32)
Calcium: 9.9 mg/dL (ref 8.4–10.5)
Creatinine, Ser: 1.03 mg/dL (ref 0.40–1.50)
GFR: 86.63 mL/min (ref >60.00)
GLUCOSE: 114 mg/dL — AB (ref 70–99)
Potassium: 4.3 mEq/L (ref 3.5–5.1)
Sodium: 135 mEq/L (ref 135–145)

## 2015-02-22 LAB — CBC WITH DIFFERENTIAL/PLATELET
BASOS PCT: 0.9 % (ref 0.0–3.0)
Basophils Absolute: 0.1 10*3/uL (ref 0.0–0.1)
EOS PCT: 3.5 % (ref 0.0–5.0)
Eosinophils Absolute: 0.2 10*3/uL (ref 0.0–0.7)
HCT: 39.5 % (ref 39.0–52.0)
HEMOGLOBIN: 13.4 g/dL (ref 13.0–17.0)
LYMPHS PCT: 28.6 % (ref 12.0–46.0)
Lymphs Abs: 1.7 10*3/uL (ref 0.7–4.0)
MCHC: 33.9 g/dL (ref 30.0–36.0)
MCV: 85.8 fl (ref 78.0–100.0)
MONOS PCT: 8.1 % (ref 3.0–12.0)
Monocytes Absolute: 0.5 10*3/uL (ref 0.1–1.0)
NEUTROS ABS: 3.5 10*3/uL (ref 1.4–7.7)
NEUTROS PCT: 58.9 % (ref 43.0–77.0)
Platelets: 229 10*3/uL (ref 150.0–400.0)
RBC: 4.61 Mil/uL (ref 4.22–5.81)
RDW: 13.5 % (ref 11.5–15.5)
WBC: 6 10*3/uL (ref 4.0–10.5)

## 2015-02-22 LAB — LIPID PANEL
Cholesterol: 180 mg/dL (ref 0–200)
HDL: 30.3 mg/dL — AB (ref >39.00)
NONHDL: 149.7
Total CHOL/HDL Ratio: 6
Triglycerides: 324 mg/dL — ABNORMAL HIGH (ref 0.0–149.0)
VLDL: 64.8 mg/dL — AB (ref 0.0–40.0)

## 2015-02-22 LAB — POCT URINALYSIS DIPSTICK
Bilirubin, UA: NEGATIVE
GLUCOSE UA: NEGATIVE
Ketones, UA: NEGATIVE
Leukocytes, UA: NEGATIVE
NITRITE UA: NEGATIVE
PH UA: 6.5
Protein, UA: NEGATIVE
RBC UA: NEGATIVE
Spec Grav, UA: 1.015
UROBILINOGEN UA: 0.2

## 2015-02-22 LAB — LDL CHOLESTEROL, DIRECT: Direct LDL: 106 mg/dL

## 2015-02-22 LAB — HEMOGLOBIN A1C: Hgb A1c MFr Bld: 7.5 % — ABNORMAL HIGH (ref 4.6–6.5)

## 2015-02-22 LAB — MICROALBUMIN / CREATININE URINE RATIO
Creatinine,U: 116.1 mg/dL
MICROALB UR: 4.6 mg/dL — AB (ref 0.0–1.9)
MICROALB/CREAT RATIO: 4 mg/g (ref 0.0–30.0)

## 2015-02-22 NOTE — Patient Instructions (Signed)
Labs today  Continue current medications  Continue diet  Walk 30 minutes daily  We will call you the report and then arrange follow-up

## 2015-02-22 NOTE — Progress Notes (Signed)
Pre visit review using our clinic review tool, if applicable. No additional management support is needed unless otherwise documented below in the visit note. 

## 2015-02-22 NOTE — Progress Notes (Signed)
   Subjective:    Patient ID: Allen DerryVincent P Halterman, male    DOB: April 24, 1976, 39 y.o.   MRN: [id number]  HPI [name] is a 39 year old male who comes in today for follow-up of diabetes  We had him on metformin 1000 mg twice a day however his blood sugar jumped up to 300 400 range. We therefore started him on insulin long-acting 10 units daily. He's also changed his diet and his walking 3 out of 7 days. Blood sugars have dropped to 120. No hypoglycemia  We had him scheduled for lab last fall and he never came back for follow-up labs. Will get his lab work today   Review of Systems    review of systems otherwise negative Objective:   Physical Exam Well-developed well-nourished male no acute distress vital signs stable he is afebrile BP today 120/84 weight 213 blood sugar this morning at home 126       Assessment & Plan:  Diabetes type 1........... continue metformin 1000 mg twice a day and insulin 10 units daily.......Marland Kitchen. most importantly diet and exercise.......... check labs today........ follow-up in September

## 2015-02-23 ENCOUNTER — Telehealth: Payer: Self-pay | Admitting: Family Medicine

## 2015-03-26 ENCOUNTER — Encounter: Payer: Self-pay | Admitting: Family Medicine

## 2015-03-26 DIAGNOSIS — E1110 Type 2 diabetes mellitus with ketoacidosis without coma: Secondary | ICD-10-CM

## 2015-05-28 ENCOUNTER — Ambulatory Visit (INDEPENDENT_AMBULATORY_CARE_PROVIDER_SITE_OTHER): Payer: BLUE CROSS/BLUE SHIELD | Admitting: Internal Medicine

## 2015-05-28 ENCOUNTER — Other Ambulatory Visit (INDEPENDENT_AMBULATORY_CARE_PROVIDER_SITE_OTHER): Payer: BLUE CROSS/BLUE SHIELD | Admitting: *Deleted

## 2015-05-28 ENCOUNTER — Encounter: Payer: Self-pay | Admitting: Internal Medicine

## 2015-05-28 VITALS — BP 114/70 | HR 81 | Temp 97.9°F | Resp 12 | Ht 69.0 in | Wt 212.0 lb

## 2015-05-28 DIAGNOSIS — E139 Other specified diabetes mellitus without complications: Secondary | ICD-10-CM | POA: Diagnosis not present

## 2015-05-28 DIAGNOSIS — E1165 Type 2 diabetes mellitus with hyperglycemia: Secondary | ICD-10-CM | POA: Diagnosis not present

## 2015-05-28 LAB — POCT GLYCOSYLATED HEMOGLOBIN (HGB A1C): HEMOGLOBIN A1C: 6.7

## 2015-05-28 NOTE — Progress Notes (Addendum)
Patient ID: Cody Short, male   DOB: [birth year], 39 y.o.   MRN: [id number]  HPI: Cody Short is a 39 y.o.-year-old male, referred by his PCP, Dr. Tawanna Cooler, for management of DM (unclear if 1 or 2), dx in ~2010, insulin-dependent, uncontrolled, without complications. He is here with his mother who offers part of the history.  In 2010, he was on Depakote >> pancreatitis >> discharged on insulin >> he stopped insulin >> restarted in 01/2015.  Last hemoglobin A1c was: Lab Results  Component Value Date   HGBA1C 7.5* 02/22/2015   HGBA1C 6.5 08/22/2014   HGBA1C 6.7* 01/03/2014   He instituted healthy changes in his diet after last HbA1c.  Pt is on a regimen of: - Metformin 1000 mg 2x a day, with meals  - Lantus 10 units at bedtime - added as sugars still high after Metformin start He tried Glipizide >> hypoglycemia in the 40s repeatedly. He would not want to take this again.   Pt checks his sugars 1-2x a day and they are: - am: 118-150, 180 - 2h after b'fast: n/c - before lunch: n/c - 2h after lunch: n/c - before dinner: n/c - 2h after dinner: 110-120, if not exercising 200-300s after exercising - bedtime: n/c - nighttime: n/c No lows. Lowest sugar was 110; he has hypoglycemia awareness at 70.  Highest sugar was 350.  Glucometer: FreeStyle  Pt's meals are: - Breakfast: fajitas w/o tortillas, [employer] - Lunch: Cesar salad; chicken wings  - Dinner: steak + starch + veggies/beans/salads/corn, BBQ and cole slaw - Snacks: chicken wings, sandwich Walks 1h every day.  - no CKD, last BUN/creatinine:  Lab Results  Component Value Date   BUN 15 02/22/2015   CREATININE 1.03 02/22/2015   - last set of lipids: Lab Results  Component Value Date   CHOL 180 02/22/2015   HDL 30.30* 02/22/2015   LDLCALC 67 02/02/2014   LDLDIRECT 106.0 02/22/2015   TRIG 324.0* 02/22/2015   CHOLHDL 6 02/22/2015  On Fenofibrate.  - last eye exam was in 2015. No DR.  - + numbness; no  tingling in his feet. He has recurrent furunculosis - groin.  Pt has FH of DM in mother, father.  ROS: Constitutional: no weight gain/loss,+ fatigue, no subjective hyperthermia/hypothermia, + poor sleep, + excessive urination, esp at night (drinks diet sodas) Eyes: + blurry vision, no xerophthalmia ENT: no sore throat, no nodules palpated in throat, no dysphagia/odynophagia, no hoarseness Cardiovascular: no CP/SOB/palpitations/leg swelling Respiratory: no cough/SOB Gastrointestinal: no N/V/+ D/no C Musculoskeletal: no muscle/joint aches Skin: no rashes Neurological: no tremors/numbness/tingling/dizziness Psychiatric: no depression/anxiety  Past Medical History  Diagnosis Date  . 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 DM w/o Complication Type II 07/05/2007  . HYPERTRIGLYCERIDEMIA 10/14/2010  . Nocturia 07/04/2010  . PILAR CYST 08/03/2007  . TOBACCO ABUSE, HX OF 07/31/2009  . Bipolar affective disorder    Past Surgical History  Procedure Laterality Date  . Pilar cystectomy     History   Social History  . Marital Status: Single    Spouse Name: N/A  . Number of Children: 0   Occupational History  .    Social History Main Topics  . Smoking status: Former Smoker - quit in 2010  . Smokeless tobacco: Never Used  . Alcohol Use: No     Comment: none at all  . Drug Use: No   Current Outpatient Prescriptions on File Prior to Visit  Medication Sig Dispense Refill  . fenofibrate (TRICOR) 145 MG tablet  take two tabs every morning 180 tablet 1  . Insulin Glargine (LANTUS SOLOSTAR) 100 UNIT/ML Solostar Pen Inject 10 Units into the skin daily at 10 pm. 30 pen 1  . Insulin Pen Needle 32G X 4 MM MISC 1 each by Does not apply route daily. 100 each 3  . lithium 300 MG capsule Take by mouth. 1 every morning , 4 every evening    . metFORMIN (GLUCOPHAGE) 1000 MG tablet TAKE ONE TABLET BY MOUTH TWICE DAILY 180 tablet 1  . perphenazine (TRILAFON) 4 MG tablet Take 1 tablet (4 mg total) by mouth 2 (two)  times daily. 90 tablet 0  . perphenazine (TRILAFON) 8 MG tablet Take 32 mg by mouth daily.     . propranolol (INDERAL) 40 MG tablet Take 40 mg by mouth daily as needed.     Marland Kitchen QVAR 40 MCG/ACT inhaler INHALE TWO PUFFS TWICE DAILY. 8.7 g 11  . SULFACLEANSE 8/4 8-4 % SUSP 3 (three) times a week.   1  . ampicillin (PRINCIPEN) 500 MG capsule   1  . HYDROcodone-homatropine (HYCODAN) 5-1.5 MG/5ML syrup Take 5 mLs by mouth every 8 (eight) hours as needed. (Patient not taking: Reported on 05/28/2015) 120 mL 0   No current facility-administered medications on file prior to visit.   Allergies  Allergen Reactions  . Divalproex Sodium Other (See Comments)    unknown  . Methylphenidate Hcl     Aggravate bipolar  . Oxycodone Hcl     REACTION: hallucinations  . Paroxetine     Aggravate bipolar disorder   Family History  Problem Relation Age of Onset  . Diabetes Mother   . Diabetes Father   . Colon cancer Neg Hx   . Stomach cancer Neg Hx   . Melanoma Maternal Uncle   . Pancreatitis Neg Hx   . Heart disease Neg Hx   . Kidney disease Neg Hx   . Liver disease Neg Hx    PE: BP 114/70 mmHg  Pulse 81  Temp(Src) 97.9 F (36.6 C) (Oral)  Resp 12  Ht 5\' 9"  (1.753 m)  Wt 212 lb (96.163 kg)  BMI 31.29 kg/m2  SpO2 96% Wt Readings from Last 3 Encounters:  05/28/15 212 lb (96.163 kg)  02/22/15 213 lb (96.616 kg)  02/15/15 213 lb (96.616 kg)   Constitutional: overweight, in NAD Eyes: PERRLA, EOMI, no exophthalmos ENT: moist mucous membranes, no thyromegaly, no cervical lymphadenopathy Cardiovascular: RRR, No MRG Respiratory: CTA B Gastrointestinal: abdomen soft, NT, ND, BS+ Musculoskeletal: no deformities, strength intact in all 4 Skin: moist, warm, no rashes Neurological: no tremor with outstretched hands, DTR normal in all 4  ASSESSMENT: 1. DM2, non-insulin-dependent, uncontrolled, without complications  PLAN:  1. Patient with long-standing, fairly well controlled diabetes, on Metformin  + low dose basal insulin added 3 mo ago after HbA1c returned 7.5%, with improved control now. He checked his sugar every day, but documents the reads only in the morning. He tells me that his sugars later in the day can be high, 200s to 300s, although I do not get a good idea about how often this happens. He is trying to exercise frequently, and when he does, his sugars are well controlled later in the day. I encouraged him to continue to exercise, which is determined to do. He is also continuing to improve his diet and today I discussed with him and his mother about ways to improve it. His mother is the one who cooks his meals, as he  is living with his parents. - We discussed about options for treatment, and I suggested to stay on the same regimen for now, since, based on the hemoglobin A1c obtained at today's appointment (6.8%), there are no major problems with his diabetes control. He denies lows, also. I am curious whether he can come off Lantus, since he is using such a small dose, but we have to be careful since he could not tolerate a sulfonylurea due to severe hypoglycemia and we cannot use a DPP 4 inhibitor or a GLP-1 receptor agonist due to his history of pancreatitis. Invokana may be an option, however, not in any insulin deficient patient. Therefore, I would like to check his anti-pancreatic antibodies, along with a C-peptide and a fasting glucose to see if he has type I or type 2 diabetes.   Patient Instructions  Please continue: - Metformin 1000 mg 2x a day - Lantus 10 units at bedtime  Please return in 1.5 months with your sugar log.   Please let me know if the sugars are consistently <80 or >200.  - continue checking sugars at different times of the day - check 1-2 times a day, rotating checks  - given sugar log and advised how to fill it and to bring it at next appt  - given foot care handout and explained the principles  - given instructions for hypoglycemia management "15-15 rule"   - advised for yearly eye exams >> needs a new exam - Return to clinic in 1.5 mo with sugar log  - Tests ordered today: Orders Placed This Encounter  Procedures  . C-peptide  . Glucose, Fasting  . Glutamic acid decarboxylase auto abs  . Anti-islet cell antibody  I will addend the results when they become available.  Component     Latest Ref Rng 05/28/2015  C-Peptide     0.80 - 3.90 ng/mL 2.88  Glucose, Fasting     65 - 99 mg/dL 098 (H)  Glutamic Acid Decarb Ab     <5 IU/mL <5  Pancreatic Islet Cell Antibody     < 5 JDF Units <5  No signs of DM1.

## 2015-05-28 NOTE — Patient Instructions (Addendum)
Please continue: - Metformin 1000 mg 2x a day - Lantus 10 units at bedtime  Please return in 1.5 months with your sugar log.   Please let me know if the sugars are consistently <80 or >200.  PATIENT INSTRUCTIONS FOR TYPE 2 DIABETES:  DIET AND EXERCISE Diet and exercise is an important part of diabetic treatment.  We recommended aerobic exercise in the form of brisk walking (working between 40-60% of maximal aerobic capacity, similar to brisk walking) for 150 minutes per week (such as 30 minutes five days per week) along with 3 times per week performing 'resistance' training (using various gauge rubber tubes with handles) 5-10 exercises involving the major muscle groups (upper body, lower body and core) performing 10-15 repetitions (or near fatigue) each exercise. Start at half the above goal but build slowly to reach the above goals. If limited by weight, joint pain, or disability, we recommend daily walking in a swimming pool with water up to waist to reduce pressure from joints while allow for adequate exercise.    BLOOD GLUCOSES Monitoring your blood glucoses is important for continued management of your diabetes. Please check your blood glucoses 2-4 times a day: fasting, before meals and at bedtime (you can rotate these measurements - e.g. one day check before the 3 meals, the next day check before 2 of the meals and before bedtime, etc.).   HYPOGLYCEMIA (low blood sugar) Hypoglycemia is usually a reaction to not eating, exercising, or taking too much insulin/ other diabetes drugs.  Symptoms include tremors, sweating, hunger, confusion, headache, etc. Treat IMMEDIATELY with 15 grams of Carbs: . 4 glucose tablets .  cup regular juice/soda . 2 tablespoons raisins . 4 teaspoons sugar . 1 tablespoon honey Recheck blood glucose in 15 mins and repeat above if still symptomatic/blood glucose <100.  RECOMMENDATIONS TO REDUCE YOUR RISK OF DIABETIC COMPLICATIONS: * Take your prescribed  MEDICATION(S) * Follow a DIABETIC diet: Complex carbs, fiber rich foods, (monounsaturated and polyunsaturated) fats * AVOID saturated/trans fats, high fat foods, >2,300 mg salt per day. * EXERCISE at least 5 times a week for 30 minutes or preferably daily.  * DO NOT SMOKE OR DRINK more than 1 drink a day. * Check your FEET every day. Do not wear tightfitting shoes. Contact us if you develop an ulcer * See your EYE doctor once a year or more if needed * Get a FLU shot once a year * Get a PNEUMONIA vaccine once before and once after age 4 years  GOALS:  * Your Hemoglobin A1c of <7%  * fasting sugars need to be <130 * after meals sugars need to be <180 (2h after you start eating) * Your Systolic BP should be 140 or lower  * Your Diastolic BP should be 80 or lower  * Your HDL (Good Cholesterol) should be 40 or higher  * Your LDL (Bad Cholesterol) should be 100 or lower. * Your Triglycerides should be 150 or lower  * Your Urine microalbumin (kidney function) should be <30 * Your Body Mass Index should be 25 or lower    Please consider the following ways to cut down carbs and fat and increase fiber and micronutrients in your diet: - substitute whole grain for white bread or pasta - substitute brown rice for white rice - substitute 90-calorie flat bread pieces for slices of bread when possible - substitute sweet potatoes or yams for white potatoes - substitute humus for margarine - substitute tofu for cheese when possible -  substitute almond or rice milk for regular milk (would not drink soy milk daily due to concern for soy estrogen influence on breast cancer risk) - substitute dark chocolate for other sweets when possible - substitute water - can add lemon or orange slices for taste - for diet sodas (artificial sweeteners will trick your body that you can eat sweets without getting calories and will lead you to overeating and weight gain in the long run) - do not skip breakfast or other  meals (this will slow down the metabolism and will result in more weight gain over time)  - can try smoothies made from fruit and almond/rice milk in am instead of regular breakfast - can also try old-fashioned (not instant) oatmeal made with almond/rice milk in am - order the dressing on the side when eating salad at a restaurant (pour less than half of the dressing on the salad) - eat as little meat as possible - can try juicing, but should not forget that juicing will get rid of the fiber, so would alternate with eating raw veg./fruits or drinking smoothies - use as little oil as possible, even when using olive oil - can dress a salad with a mix of balsamic vinegar and lemon juice, for e.g. - use agave nectar, stevia sugar, or regular sugar rather than artificial sweateners - steam or broil/roast veggies  - snack on veggies/fruit/nuts (unsalted, preferably) when possible, rather than processed foods - reduce or eliminate aspartame in diet (it is in diet sodas, chewing gum, etc) Read the labels!  Try to read Dr. Katherina Right book: "Program for Reversing Diabetes" for other ideas for healthy eating.

## 2015-05-29 LAB — C-PEPTIDE: C-Peptide: 2.88 ng/mL (ref 0.80–3.90)

## 2015-05-29 LAB — GLUTAMIC ACID DECARBOXYLASE AUTO ABS

## 2015-05-29 LAB — GLUCOSE, FASTING: GLUCOSE, FASTING: 120 mg/dL — AB (ref 65–99)

## 2015-06-04 LAB — ANTI-ISLET CELL ANTIBODY: Pancreatic Islet Cell Antibody: <5 JDF Units (ref <5)

## 2015-06-05 NOTE — Addendum Note (Signed)
Addended by: Carlus Pavlov on: 06/05/2015 07:45 AM   Modules accepted: Level of Service

## 2015-06-14 ENCOUNTER — Encounter: Payer: Self-pay | Admitting: Internal Medicine

## 2015-06-15 ENCOUNTER — Encounter: Payer: Self-pay | Admitting: Internal Medicine

## 2015-06-21 ENCOUNTER — Encounter: Payer: Self-pay | Admitting: Internal Medicine

## 2015-06-25 ENCOUNTER — Encounter: Payer: Self-pay | Admitting: Internal Medicine

## 2015-06-26 ENCOUNTER — Encounter: Payer: Self-pay | Admitting: Internal Medicine

## 2015-06-27 ENCOUNTER — Encounter: Payer: Self-pay | Admitting: Family Medicine

## 2015-06-27 ENCOUNTER — Ambulatory Visit (INDEPENDENT_AMBULATORY_CARE_PROVIDER_SITE_OTHER): Payer: BLUE CROSS/BLUE SHIELD | Admitting: Family Medicine

## 2015-06-27 ENCOUNTER — Other Ambulatory Visit: Payer: Self-pay | Admitting: Family Medicine

## 2015-06-27 ENCOUNTER — Telehealth: Payer: Self-pay | Admitting: Family Medicine

## 2015-06-27 VITALS — BP 130/82 | HR 80 | Temp 98.3°F | Wt 209.0 lb

## 2015-06-27 DIAGNOSIS — Z87891 Personal history of nicotine dependence: Secondary | ICD-10-CM | POA: Insufficient documentation

## 2015-06-27 DIAGNOSIS — Z23 Encounter for immunization: Secondary | ICD-10-CM

## 2015-06-27 DIAGNOSIS — J4520 Mild intermittent asthma, uncomplicated: Secondary | ICD-10-CM | POA: Diagnosis not present

## 2015-06-27 DIAGNOSIS — E781 Pure hyperglyceridemia: Secondary | ICD-10-CM

## 2015-06-27 MED ORDER — FENOFIBRATE 145 MG PO TABS: 145.0000 mg | ORAL_TABLET | Freq: Every day | ORAL | Status: DC

## 2015-06-27 NOTE — Assessment & Plan Note (Signed)
S: Poor control on last trig at 324 but improved from >500 in 2014 which places at risk for recurrent pancreatitis. He is working with Dr. Reece Agar on diet and is also doing Treadmill 45 minutes to an hour 5-6 days a week A/P: Will have Keba reach out to patient. Max dose on tricor should be  per up to date and did not note he was on 2 tablets daily at time of visit. I have sent in new rx. He is also needing prior approval which we will send. Continue healthy lifestyle changes.

## 2015-06-27 NOTE — Telephone Encounter (Signed)
I am happy to help. Please clarify instructions:Take 1 tablet (145 mg total) by mouth daily. take two tabs every morning - Oral

## 2015-06-27 NOTE — Telephone Encounter (Signed)
Marchelle Folks, could you help me with a prior auth for tricor for patient?  He has a history of pancreatitis and his triglycerides were over 500 (02/2013) before being on medicine which places him at increased risk for recurrent pancreatitis. He needs this medicine to lower his risk. I will have Ardine Bjork bring you the paper from [facility] that he received. Your help is much appreciated

## 2015-06-27 NOTE — Progress Notes (Signed)
Cody Conch, MD Phone: [phone]  Subjective:  Patient presents today to establish care with me as their new primary care provider. Patient was formerly a patient of Dr. Tawanna Cooler. Chief complaint-noted.   See problem oriented charting ROS- no hypoglycemia recently but has had in past, no blurry vision. No chest pain or shortness of breath or abdominal pain.   The following were reviewed and entered/updated in epic: Past Medical History  Diagnosis Date  . 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 DM w/o Complication Type II 07/05/2007  . HYPERTRIGLYCERIDEMIA 10/14/2010  . Nocturia 07/04/2010  . TOBACCO ABUSE, HX OF 07/31/2009  . Bipolar affective disorder   . PILAR CYST 08/03/2007    Cyst in groin-states comes up when blood sugar gets high. Recurs if cannot walk for a week.      Patient Active Problem List   Diagnosis Date Noted  . Bipolar depression 03/23/2013    Priority: High  . Diabetes mellitus type 2, controlled 07/05/2007    Priority: High  . Asthma 04/25/2013    Priority: Medium  . HYPERTRIGLYCERIDEMIA 10/14/2010    Priority: Medium  . 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 Rectal bleeding 08/22/2014    Priority: Low  . Hx of pancreatitis 02/07/2014    Priority: Low  . Former smoker 06/27/2015   Past Surgical History  Procedure Laterality Date  . Pilar cystectomy      Family History  Problem Relation Age of Onset  . Diabetes Mother   . Diabetes Father   . Colon cancer Neg Hx   . Stomach cancer Neg Hx   . Melanoma Maternal Uncle   . Pancreatitis Neg Hx   . Heart disease Neg Hx   . Kidney disease Neg Hx   . Liver disease Neg Hx     Medications- reviewed and updated Current Outpatient Prescriptions  Medication Sig Dispense Refill  . ampicillin (PRINCIPEN) 500 MG capsule   1  . fenofibrate (TRICOR) 145 MG tablet take two tabs every morning 180 tablet 1  . Insulin Glargine (LANTUS SOLOSTAR) 100 UNIT/ML Solostar Pen Inject 10 Units into the skin daily  at 10 pm. 30 pen 1  . Insulin Pen Needle 32G X 4 MM MISC 1 each by Does not apply route daily. 100 each 3  . lithium 300 MG capsule Take by mouth. 1 every morning , 4 every evening    . metFORMIN (GLUCOPHAGE) 1000 MG tablet TAKE ONE TABLET BY MOUTH TWICE DAILY 180 tablet 1  . perphenazine (TRILAFON) 8 MG tablet Take 32 mg by mouth daily.     . propranolol (INDERAL) 40 MG tablet Take 40 mg by mouth daily as needed.     Marland Kitchen QVAR 40 MCG/ACT inhaler INHALE TWO PUFFS TWICE DAILY. 8.7 g 11  . SULFACLEANSE 8/4 8-4 % SUSP 3 (three) times a week.   1   No current facility-administered medications for this visit.    Allergies-reviewed and updated Allergies  Allergen Reactions  . Divalproex Sodium Other (See Comments)    unknown  . Methylphenidate Hcl     Aggravate bipolar  . Oxycodone Hcl     REACTION: hallucinations  . Paroxetine     Aggravate bipolar disorder    Social History   Social History  . Marital Status: Single    Spouse Name: N/A  . Number of Children: N/A  . Years of Education: N/A   Social History Main Topics  . Smoking status: Former Smoker -- 2.50 packs/day for  10 years    Types: Cigarettes    Quit date: 10/27/2006  . Smokeless tobacco: Never Used  . Alcohol Use: No     Comment: none at all  . Drug Use: No  . Sexual Activity: Not Asked   Other Topics Concern  . None   Social History Narrative   Single. Not dating currently. No kids.    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 Oropharynx normal. TM normal CV: RRR no murmurs rubs or gallops Lungs: CTAB no crackles, wheeze, rhonchi Abdomen: soft/nontender/nondistended/normal bowel sounds. No rebound or guarding.  Ext: no edema Skin:  warm, dry Neuro: grossly normal, moves all extremities, PERRLA  Assessment/Plan:  HYPERTRIGLYCERIDEMIA S: Poor control on last trig at 324 but improved from >500 in 2014 which places at risk for recurrent pancreatitis. He is working with Dr. Reece Agar on diet and is also doing Treadmill 45 minutes to an hour 5-6 days a week A/P: Will have Keba reach out to patient. Max dose on tricor should be 145mg  per up to date and did not note he was on 2 tablets daily at time of visit. I have sent in new rx. He is also needing prior approval which we will send. Continue healthy lifestyle changes.    Asthma S: Stopped using qvar 2 years ago. Denies wheeze or shortness of breath since that time. Patient is Former smoker- 25 pack years in 10 years quit 2008 and this may have had some effects A/P: offered albuterol inhaler for prn use if needed but patient declines- will call in if he has issues.    6 months for CPE with labsbefore visit  Orders Placed This Encounter  Procedures  . Flu Vaccine QUAD 36+ mos IM    Meds ordered this encounter  Medications  . fenofibrate (TRICOR) 145 MG tablet    Sig: Take 1 tablet (145 mg total) by mouth daily. take two tabs every morning    Dispense:  90 tablet    Refill:  3

## 2015-06-27 NOTE — Telephone Encounter (Signed)
I tried to change it to once a day but must have left the old sig on accident. It is just once a day. Sorry!

## 2015-06-27 NOTE — Assessment & Plan Note (Signed)
S: Stopped using qvar 2 years ago. Denies wheeze or shortness of breath since that time. Patient is Former smoker- 25 pack years in 10 years quit 2008 and this may have had some effects A/P: offered albuterol inhaler for prn use if needed but patient declines- will call in if he has issues.

## 2015-06-27 NOTE — Patient Instructions (Addendum)
Medication Instructions:  No changes  Other Instructions:  Health Maintenance Due  Topic Date Due  . OPHTHALMOLOGY EXAM  09/12/1988  Get eye exam scheduled and have results faxed to [place] at [phone].  Testing/Procedures/Immunizations: Flu shot received today.  Follow-Up (all visit scheduling, rescheduling, cancellations including labs should be scheduled at front desk): PHysical in 6 months with labs a few days before.   Sooner if you need [place] or if you have new or worsening symptoms

## 2015-07-04 ENCOUNTER — Encounter: Payer: Self-pay | Admitting: Internal Medicine

## 2015-07-09 ENCOUNTER — Encounter: Payer: Self-pay | Admitting: Internal Medicine

## 2015-07-11 ENCOUNTER — Encounter: Payer: Self-pay | Admitting: Internal Medicine

## 2015-07-11 ENCOUNTER — Ambulatory Visit (INDEPENDENT_AMBULATORY_CARE_PROVIDER_SITE_OTHER): Payer: BLUE CROSS/BLUE SHIELD | Admitting: Internal Medicine

## 2015-07-11 VITALS — BP 118/72 | HR 75 | Temp 98.1°F | Resp 12 | Wt 205.6 lb

## 2015-07-11 DIAGNOSIS — E119 Type 2 diabetes mellitus without complications: Secondary | ICD-10-CM

## 2015-07-11 NOTE — Progress Notes (Signed)
Patient ID: Cody Short, male   DOB: [birth year], 39 y.o.   MRN: [id number]  HPI: Cody Short is a 39 y.o.-year-old male, returning for follow-up for DM 2, dx in ~2010, insulin-dependent, uncontrolled, without complications. He is here with his mother who offers part of the history.  In 2010, he was on Depakote >> pancreatitis >> discharged on insulin >> he stopped insulin >> restarted in 01/2015.  At last visit, we discussed about the planned-based diet, which he started and lost 7 pounds in a month and a half.  Last hemoglobin A1c was: Lab Results  Component Value Date   HGBA1C 6.7 05/28/2015   HGBA1C 7.5* 02/22/2015   HGBA1C 6.5 08/22/2014   Pt is on a regimen of: - Metformin 1000 mg 2x a day, with meals  - Lantus 10 units at bedtime - only took ~5 x after last visit!  He tried Glipizide >> hypoglycemia in the 40s repeatedly. He would not want to take this again.   Pt checks his sugars 1-2x a day and they are: - am: 118-150, 180 >> 93-109 - 2h after b'fast: n/c - before lunch: n/c >> 96-115 - 2h after lunch: n/c - before dinner: n/c >> 81-113 - 2h after dinner: 110-120, if not exercising 200-300s after exercising >> 98-160, 183 - bedtime: n/c - nighttime: n/c No lows. Lowest sugar was 110; he has hypoglycemia awareness at 70.  Highest sugar was 350 >> 183.  Glucometer: FreeStyle  Pt's meals are: - Breakfast: fajitas w/o tortillas, [employer] - Lunch: Cesar salad; chicken wings  - Dinner: steak + starch + veggies/beans/salads/corn, BBQ and cole slaw - Snacks: chicken wings, sandwich  He exercises 250 min on the treadmill daily.  - no CKD, last BUN/creatinine:  Lab Results  Component Value Date   BUN 15 02/22/2015   CREATININE 1.03 02/22/2015   - last set of lipids: Lab Results  Component Value Date   CHOL 180 02/22/2015   HDL 30.30* 02/22/2015   LDLCALC 67 02/02/2014   LDLDIRECT 106.0 02/22/2015   TRIG 324.0* 02/22/2015   CHOLHDL 6  02/22/2015  On Fenofibrate.  - last eye exam was in 2015. No DR.  - + numbness; no tingling in his feet. He has recurrent furunculosis - groin.  ROS: Constitutional: + weight loss,no fatigue, no subjective hyperthermia/hypothermia Eyes: no blurry vision, no xerophthalmia ENT: no sore throat, no nodules palpated in throat, no dysphagia/odynophagia, no hoarseness Cardiovascular: no CP/SOB/palpitations/leg swelling Respiratory: no cough/SOB Gastrointestinal: no N/V/D/C Musculoskeletal: no muscle/joint aches Skin: no rashes Neurological: no tremors/numbness/tingling/dizziness  I reviewed pt's medications, allergies, PMH, social hx, family hx, and changes were documented in the history of present illness. Otherwise, unchanged from my initial visit note.  Past Medical History  Diagnosis Date  . 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 DM w/o Complication Type II 07/05/2007  . HYPERTRIGLYCERIDEMIA 10/14/2010  . Nocturia 07/04/2010  . TOBACCO ABUSE, HX OF 07/31/2009  . Bipolar affective disorder   . PILAR CYST 08/03/2007    Cyst in groin-states comes up when blood sugar gets high. Recurs if cannot walk for a week.      Past Surgical History  Procedure Laterality Date  . Pilar cystectomy     History   Social History  . Marital Status: Single    Spouse Name: N/A  . Number of Children: 0   Occupational History  .    Social History Main Topics  . Smoking status: Former Smoker - quit in 2010  .  Smokeless tobacco: Never Used  . Alcohol Use: No     Comment: none at all  . Drug Use: No   Current Outpatient Prescriptions on File Prior to Visit  Medication Sig Dispense Refill  . fenofibrate (TRICOR) 145 MG tablet Take 1 tablet (145 mg total) by mouth daily. 90 tablet 3  . Insulin Glargine (LANTUS SOLOSTAR) 100 UNIT/ML Solostar Pen Inject 10 Units into the skin daily at 10 pm. 30 pen 1  . Insulin Pen Needle 32G X 4 MM MISC 1 each by Does not apply route daily. 100 each 3  . lithium 300 MG  capsule Take by mouth. 1 every morning , 4 every evening    . metFORMIN (GLUCOPHAGE) 1000 MG tablet TAKE ONE TABLET BY MOUTH TWICE DAILY 180 tablet 1  . perphenazine (TRILAFON) 8 MG tablet Take 32 mg by mouth daily.     . SULFACLEANSE 8/4 8-4 % SUSP 3 (three) times a week.   1   No current facility-administered medications on file prior to visit.   Allergies  Allergen Reactions  . Divalproex Sodium Other (See Comments)    unknown  . Methylphenidate Hcl     Aggravate bipolar  . Oxycodone Hcl     REACTION: hallucinations  . Paroxetine     Aggravate bipolar disorder   Family History  Problem Relation Age of Onset  . Diabetes Mother   . Diabetes Father   . Colon cancer Neg Hx   . Stomach cancer Neg Hx   . Melanoma Maternal Uncle   . Pancreatitis Neg Hx   . Heart disease Neg Hx   . Kidney disease Neg Hx   . Liver disease Neg Hx    PE: BP 118/72 mmHg  Pulse 75  Temp(Src) 98.1 F (36.7 C) (Oral)  Resp 12  Wt 205 lb 9.6 oz (93.26 kg)  SpO2 96% Body mass index is 30.35 kg/(m^2). Wt Readings from Last 3 Encounters:  07/11/15 205 lb 9.6 oz (93.26 kg)  06/27/15 209 lb (94.802 kg)  05/28/15 212 lb (96.163 kg)   Constitutional: overweight, in NAD Eyes: PERRLA, EOMI, no exophthalmos ENT: moist mucous membranes, no thyromegaly, no cervical lymphadenopathy Cardiovascular: RRR, No MRG Respiratory: CTA B Gastrointestinal: abdomen soft, NT, ND, BS+ Musculoskeletal: no deformities, strength intact in all 4 Skin: moist, warm, no rashes Neurological: no tremor with outstretched hands, DTR normal in all 4  ASSESSMENT: 1. DM2, non-insulin-dependent, uncontrolled, without complications - tested neg for DM1: Component     Latest Ref Rng 05/28/2015  C-Peptide     0.80 - 3.90 ng/mL 2.88  Glucose, Fasting     65 - 99 mg/dL 161 (H)  Glutamic Acid Decarb Ab     <5 IU/mL <5  Pancreatic Islet Cell Antibody     < 5 JDF Units <5   PLAN:  1. Patient with long-standing, fairly well  controlled diabetes, on Metformin + low dose insulin now with greatly improved sugars on a vegan diet. He also lost weight and feels great! He did not need to take Lantus since last visit except for 5x >> will stop completely. - we have to be careful in the future if his sugars increase since he could not tolerate a sulfonylurea due to severe hypoglycemia and we cannot use a DPP 4 inhibitor or a GLP-1 receptor agonist due to his history of pancreatitis. Invokana may be an option. - I advised him to: Patient Instructions  Please continue: - Metformin 1000 mg 2x a day  Stop: - Lantus 10 units at bedtime  Please return in 3 months with your sugar log.   Please let me know if the sugars are consistently <80 or >200.  - given more plant-based materials - continue checking sugars at different times of the day - check 1-2 times a day - advised for yearly eye exams >> needs a new exam - Return to clinic in 3  mo with sugar log

## 2015-07-11 NOTE — Patient Instructions (Signed)
Please continue: - Metformin 1000 mg 2x a day  Stop: - Lantus 10 units at bedtime  Please return in 3 months with your sugar log.   Please let me know if the sugars are consistently <80 or >200.  Plant-based diet materials: - Lectures (you tube):  Lequita Asal: "Breaking the Food Seduction"  Doug Lisle: "How to Lose Weight, without Losing Your Mind"  Lucile Crater: "What is Insulin Resistance" [email or web address] - Documentaries:  Supersize Me  [employer]  Forks over [employer], Sick and Nearly Dead  [employer] the [employer]  Overweight and undernourished - Books:  Lequita Asal: "Program for Reversing Diabetes"  Ferol Luz: "The [place] Study"  Konrad Penta: "Supermarket Vegan" (cookbook) - Facebook pages:   Forks versus Knives  Vegucated  [employer] Magazine  Food Matters - Healthy nutrition info websites:  LateTelevision.com.ee

## 2015-07-21 ENCOUNTER — Encounter: Payer: Self-pay | Admitting: Internal Medicine

## 2015-08-06 ENCOUNTER — Encounter: Payer: Self-pay | Admitting: Family Medicine

## 2015-08-08 ENCOUNTER — Encounter: Payer: Self-pay | Admitting: Internal Medicine

## 2015-08-20 LAB — HM DIABETES EYE EXAM

## 2015-08-23 ENCOUNTER — Encounter: Payer: Self-pay | Admitting: Family Medicine

## 2015-09-17 ENCOUNTER — Telehealth: Payer: Self-pay | Admitting: Family Medicine

## 2015-09-17 NOTE — Telephone Encounter (Signed)
Pt needs PA on fenobriate145 mg please call [phone] .Pt pharm is [facility]

## 2015-09-24 ENCOUNTER — Other Ambulatory Visit: Payer: Self-pay | Admitting: Family Medicine

## 2015-10-08 ENCOUNTER — Encounter: Payer: Self-pay | Admitting: Internal Medicine

## 2015-10-08 ENCOUNTER — Other Ambulatory Visit (INDEPENDENT_AMBULATORY_CARE_PROVIDER_SITE_OTHER): Payer: BLUE CROSS/BLUE SHIELD | Admitting: *Deleted

## 2015-10-08 ENCOUNTER — Ambulatory Visit (INDEPENDENT_AMBULATORY_CARE_PROVIDER_SITE_OTHER): Payer: BLUE CROSS/BLUE SHIELD | Admitting: Internal Medicine

## 2015-10-08 VITALS — BP 122/70 | HR 73 | Temp 97.7°F | Resp 12 | Wt 209.0 lb

## 2015-10-08 DIAGNOSIS — E119 Type 2 diabetes mellitus without complications: Secondary | ICD-10-CM

## 2015-10-08 LAB — POCT GLYCOSYLATED HEMOGLOBIN (HGB A1C): Hemoglobin A1C: 7.5

## 2015-10-08 MED ORDER — CANAGLIFLOZIN 100 MG PO TABS: 100.0000 mg | ORAL_TABLET | Freq: Every day | ORAL | Status: DC

## 2015-10-08 NOTE — Progress Notes (Signed)
Patient ID: KIYOSHI SCHAAB, male   DOB: 04/05/1976, 39 y.o.   MRN: [id number]  HPI: Cody Short is a 39 y.o.-year-old male, returning for follow-up for DM 2, dx in ~2010, insulin-dependent, uncontrolled, without complications. He is here with his mother who offers part of the history. Last visit 3 mo ago.  Since last visit, he had dietary indiscretions >> fell off the wagon with the vegan diet. Sugars are now higher.  Last hemoglobin A1c was: Lab Results  Component Value Date   HGBA1C 6.7 05/28/2015   HGBA1C 7.5* 02/22/2015   HGBA1C 6.5 08/22/2014   Pt is on a regimen of: - Metformin 1000 mg 2x a day, with meals  He was on Lantus 10 units at bedtime >> stopped 06/2015 as he was doing great on the vegan diet. He tried Glipizide >> hypoglycemia in the 40s repeatedly. He would not want to take this again.  He had pancreatitis 2/2 Depakote and HTG in the past..  Pt checks his sugars 1-2x a day and they are HIGHER: - am: 118-150, 180 >> 93-109 >> 160-220 - 2h after b'fast: n/c - before lunch: n/c >> 96-115 >> 129-160, 190 - 2h after lunch: n/c >> 143-189 - before dinner: n/c >> 81-113 >> 165 - 2h after dinner: 110-120, if not exercising 200-300s after exercising >> 98-160, 183 >> 85-184, 288 - bedtime: n/c >> 129-286 - nighttime: n/c No lows. Lowest sugar was 110; he has hypoglycemia awareness at 70.  Highest sugar was 350 >> 183.  Glucometer: FreeStyle  Pt's meals are: - Breakfast: fajitas w/o tortillas, [employer] - Lunch: Cesar salad; chicken wings  - Dinner: steak + starch + veggies/beans/salads/corn, BBQ and cole slaw - Snacks: chicken wings, sandwich  He exercises 250 min on the treadmill daily.  - no CKD, last BUN/creatinine:  Lab Results  Component Value Date   BUN 15 02/22/2015   CREATININE 1.03 02/22/2015   - last set of lipids: Lab Results  Component Value Date   CHOL 180 02/22/2015   HDL 30.30* 02/22/2015   LDLCALC 67 02/02/2014   LDLDIRECT 106.0 02/22/2015   TRIG 324.0* 02/22/2015   CHOLHDL 6 02/22/2015  On Fenofibrate.  - last eye exam was on 08/20/2015. No DR.  - + numbness; no tingling in his feet. He has recurrent furunculosis - groin.  In 2010, he was on Depakote >> pancreatitis >> discharged on insulin >> he stopped insulin >> restarted in 01/2015 >> stopped again 06/2015.  ROS: Constitutional: + weight gain,no fatigue, no subjective hyperthermia/hypothermia, + nocturia Eyes: no blurry vision, no xerophthalmia ENT: no sore throat, no nodules palpated in throat, no dysphagia/odynophagia, no hoarseness Cardiovascular: no CP/SOB/palpitations/leg swelling Respiratory: no cough/SOB Gastrointestinal: no N/V/D/C Musculoskeletal: no muscle/joint aches Skin: no rashes Neurological: no tremors/numbness/tingling/dizziness  I reviewed pt's medications, allergies, PMH, social hx, family hx, and changes were documented in the history of present illness. Otherwise, unchanged from my initial visit note.  Past Medical History  Diagnosis Date  . 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 DM w/o Complication Type II 07/05/2007  . HYPERTRIGLYCERIDEMIA 10/14/2010  . Nocturia 07/04/2010  . TOBACCO ABUSE, HX OF 07/31/2009  . Bipolar affective disorder (HCC)   . PILAR CYST 08/03/2007    Cyst in groin-states comes up when blood sugar gets high. Recurs if cannot walk for a week.      Past Surgical History  Procedure Laterality Date  . Pilar cystectomy     History   Social History  . Marital  Status: Single    Spouse Name: N/A  . Number of Children: 0   Occupational History  .    Social History Main Topics  . Smoking status: Former Smoker - quit in 2010  . Smokeless tobacco: Never Used  . Alcohol Use: No     Comment: none at all  . Drug Use: No   Current Outpatient Prescriptions on File Prior to Visit  Medication Sig Dispense Refill  . fenofibrate (TRICOR) 145 MG tablet Take 1 tablet (145 mg total) by mouth daily. 90 tablet  3  . lithium 300 MG capsule Take by mouth. 1 every morning , 4 every evening    . metFORMIN (GLUCOPHAGE) 1000 MG tablet TAKE 1 BY MOUTH TWICE DAILY 180 tablet 0  . SULFACLEANSE 8/4 8-4 % SUSP 3 (three) times a week.   1   No current facility-administered medications on file prior to visit.   Allergies  Allergen Reactions  . Divalproex Sodium Other (See Comments)    unknown  . Methylphenidate Hcl     Aggravate bipolar  . Oxycodone Hcl     REACTION: hallucinations  . Paroxetine     Aggravate bipolar disorder   Family History  Problem Relation Age of Onset  . Diabetes Mother   . Diabetes Father   . Colon cancer Neg Hx   . Stomach cancer Neg Hx   . Melanoma Maternal Uncle   . Pancreatitis Neg Hx   . Heart disease Neg Hx   . Kidney disease Neg Hx   . Liver disease Neg Hx    PE: BP 122/70 mmHg  Pulse 73  Temp(Src) 97.7 F (36.5 C) (Oral)  Resp 12  Wt 209 lb (94.802 kg)  SpO2 97% Body mass index is 30.85 kg/(m^2). Wt Readings from Last 3 Encounters:  10/08/15 209 lb (94.802 kg)  07/11/15 205 lb 9.6 oz (93.26 kg)  06/27/15 209 lb (94.802 kg)   Constitutional: overweight, in NAD Eyes: PERRLA, EOMI, no exophthalmos ENT: moist mucous membranes, no thyromegaly, no cervical lymphadenopathy Cardiovascular: RRR, No MRG Respiratory: CTA B Gastrointestinal: abdomen soft, NT, ND, BS+ Musculoskeletal: no deformities, strength intact in all 4 Skin: moist, warm, no rashes Neurological: no tremor with outstretched hands, DTR normal in all 4  ASSESSMENT: 1. DM2, non-insulin-dependent, uncontrolled, without complications - tested neg for DM1: Component     Latest Ref Rng 05/28/2015  C-Peptide     0.80 - 3.90 ng/mL 2.88  Glucose, Fasting     65 - 99 mg/dL 161120 (H)  Glutamic Acid Decarb Ab     <5 IU/mL <5  Pancreatic Islet Cell Antibody     < 5 JDF Units <5   PLAN:  1. Patient with long-standing, fairly well controlled diabetes, on Metformin and now off his low dose basal  insulin as his sugars were great at last visit on a vegan diet. Since then, he relaxed his diet >> sugars higher. Will try to add Invokana.  - we have to be careful in the future if his sugars increase since he could not tolerate a sulfonylurea due to severe hypoglycemia and we cannot use a DPP 4 inhibitor or a GLP-1 receptor agonist due to his history of pancreatitis.  - I advised him to: Patient Instructions  Please continue Metformin 1000 mg 2x a day, with meals  Add Invokana 100 mg in am.  Please return in 1.5 months with your sugar log.   - continue checking sugars at different times of the day -  check 1-2 times a day - advised for yearly eye exams >> UTD - checked HbA1c today >> 7.5% (higher) - Return to clinic in 1.5  mo with sugar log >> will need a BMP then

## 2015-10-08 NOTE — Patient Instructions (Signed)
Please continue Metformin 1000 mg 2x a day, with meals  Add Invokana 100 mg in am.  Please return in 1.5 months with your sugar log.

## 2015-10-28 ENCOUNTER — Encounter: Payer: Self-pay | Admitting: Internal Medicine

## 2015-10-31 ENCOUNTER — Encounter: Payer: Self-pay | Admitting: Internal Medicine

## 2015-11-01 ENCOUNTER — Encounter: Payer: Self-pay | Admitting: Internal Medicine

## 2015-11-07 ENCOUNTER — Encounter: Payer: Self-pay | Admitting: Internal Medicine

## 2015-12-03 ENCOUNTER — Encounter: Payer: Self-pay | Admitting: Internal Medicine

## 2015-12-03 ENCOUNTER — Other Ambulatory Visit: Payer: BLUE CROSS/BLUE SHIELD

## 2015-12-03 ENCOUNTER — Ambulatory Visit (INDEPENDENT_AMBULATORY_CARE_PROVIDER_SITE_OTHER): Payer: BLUE CROSS/BLUE SHIELD | Admitting: Internal Medicine

## 2015-12-03 VITALS — BP 114/80 | HR 72 | Temp 98.4°F | Resp 12 | Wt 203.8 lb

## 2015-12-03 DIAGNOSIS — E119 Type 2 diabetes mellitus without complications: Secondary | ICD-10-CM | POA: Diagnosis not present

## 2015-12-03 MED ORDER — CYCLOSET 0.8 MG PO TABS: ORAL_TABLET | ORAL | Status: DC

## 2015-12-03 NOTE — Progress Notes (Signed)
Patient ID: Cody Short, male   DOB: 1976-04-14, 41 y.o.   MRN: [id number]  HPI: Cody Short is a 40 y.o.-year-old male, returning for follow-up for DM 2, dx in ~2010, insulin-dependent, uncontrolled, without complications. He is here with his mother who offers part of the history. Last visit 3 mo ago.  In the last 6 months, he had dietary indiscretions >> fell off the wagon with the vegan diet. Sugars >> higher. At last visit, we tried to add the Invokana, however, he had excessive urination on the medication and had to stop. He restarted the plant-based diet 1 mo ago >> lost weight, sugars better.   Last hemoglobin A1c was: Lab Results  Component Value Date   HGBA1C 7.5 10/08/2015   HGBA1C 6.7 05/28/2015   HGBA1C 7.5* 02/22/2015   Pt is on a regimen of: - Metformin 1000 mg 2x a day, with meals  He was on Lantus 10 units at bedtime >> stopped 06/2015 as he was doing great on the vegan diet. He tried Glipizide >> hypoglycemia in the 40s repeatedly. He would not want to take this again.  He had pancreatitis 2/2 Depakote and HTG in the past..  Pt checks his sugars 1-2x a day and they are better:  - am: 118-150, 180 >> 93-109 >> 160-220 >> 136-154 - 2h after b'fast: n/c - before lunch: n/c >> 96-115 >> 129-160, 190 >> n/c - 2h after lunch: n/c >> 143-189 >> n/c - before dinner: n/c >> 81-113 >> 165 >> 210  - 2h after dinner: 110-120, if not exercising 200-300s after exercising >> 98-160, 183 >> 85-184, 288 >> 126-153, 238 - bedtime: n/c >> 129-286 >> 120, 169 - nighttime: n/c No lows. Lowest sugar was 110 >> 120; he has hypoglycemia awareness at 70.  Highest sugar was 350 >> 183 >> 238 (hot chocolate).  Glucometer: FreeStyle  Pt's meals are: - Breakfast: fajitas w/o tortillas, [employer] - Lunch: Cesar salad; chicken wings  - Dinner: steak + starch + veggies/beans/salads/corn, BBQ and cole slaw - Snacks: chicken wings, sandwich  He exercises 250 min on the  treadmill daily.  - no CKD, last BUN/creatinine:  Lab Results  Component Value Date   BUN 15 02/22/2015   CREATININE 1.03 02/22/2015   - last set of lipids: Lab Results  Component Value Date   CHOL 180 02/22/2015   HDL 30.30* 02/22/2015   LDLCALC 67 02/02/2014   LDLDIRECT 106.0 02/22/2015   TRIG 324.0* 02/22/2015   CHOLHDL 6 02/22/2015  On Fenofibrate.  - last eye exam was on 08/20/2015. No DR.  - + numbness; no tingling in his feet. He has recurrent furunculosis - groin.  In 2010, he was on Depakote >> pancreatitis >> discharged on insulin >> he stopped insulin >> restarted in 01/2015 >> stopped again 06/2015.  ROS: Constitutional: + weight loss,no fatigue, no subjective hyperthermia/hypothermia, no nocturia Eyes: no blurry vision, no xerophthalmia ENT: no sore throat, no nodules palpated in throat, no dysphagia/odynophagia, no hoarseness Cardiovascular: no CP/SOB/palpitations/leg swelling Respiratory: no cough/SOB Gastrointestinal: no N/V/D/C Musculoskeletal: no muscle/joint aches Skin: no rashes Neurological: no tremors/numbness/tingling/dizziness  I reviewed pt's medications, allergies, PMH, social hx, family hx, and changes were documented in the history of present illness. Otherwise, unchanged from my initial visit note.  Past Medical History  Diagnosis Date  . 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 DM w/o Complication Type II 07/05/2007  . HYPERTRIGLYCERIDEMIA 10/14/2010  . Nocturia 07/04/2010  . TOBACCO ABUSE, HX OF 07/31/2009  .  Bipolar affective disorder (HCC)   . PILAR CYST 08/03/2007    Cyst in groin-states comes up when blood sugar gets high. Recurs if cannot walk for a week.      Past Surgical History  Procedure Laterality Date  . Pilar cystectomy     History   Social History  . Marital Status: Single    Spouse Name: N/A  . Number of Children: 0   Occupational History  .    Social History Main Topics  . Smoking status: Former Smoker - quit in 2010  .  Smokeless tobacco: Never Used  . Alcohol Use: No     Comment: none at all  . Drug Use: No   Current Outpatient Prescriptions on File Prior to Visit  Medication Sig Dispense Refill  . canagliflozin (INVOKANA) 100 MG TABS tablet Take 1 tablet (100 mg total) by mouth daily. 30 tablet 2  . fenofibrate (TRICOR) 145 MG tablet Take 1 tablet (145 mg total) by mouth daily. 90 tablet 3  . lithium 300 MG capsule Take by mouth. 1 every morning , 4 every evening    . metFORMIN (GLUCOPHAGE) 1000 MG tablet TAKE 1 BY MOUTH TWICE DAILY 180 tablet 0  . SAPHRIS 10 MG SUBL Take 1 tablet by mouth at bedtime.   0  . SULFACLEANSE 8/4 8-4 % SUSP 3 (three) times a week.   1   No current facility-administered medications on file prior to visit.   Allergies  Allergen Reactions  . Divalproex Sodium Other (See Comments)    unknown  . Methylphenidate Hcl     Aggravate bipolar  . Oxycodone Hcl     REACTION: hallucinations  . Paroxetine     Aggravate bipolar disorder   Family History  Problem Relation Age of Onset  . Diabetes Mother   . Diabetes Father   . Colon cancer Neg Hx   . Stomach cancer Neg Hx   . Melanoma Maternal Uncle   . Pancreatitis Neg Hx   . Heart disease Neg Hx   . Kidney disease Neg Hx   . Liver disease Neg Hx    PE: BP 114/80 mmHg  Pulse 72  Temp(Src) 98.4 F (36.9 C) (Oral)  Resp 12  Wt 203 lb 12.8 oz (92.443 kg)  SpO2 96% Body mass index is 30.08 kg/(m^2). Wt Readings from Last 3 Encounters:  12/03/15 203 lb 12.8 oz (92.443 kg)  10/08/15 209 lb (94.802 kg)  07/11/15 205 lb 9.6 oz (93.26 kg)   Constitutional: overweight, in NAD Eyes: PERRLA, EOMI, no exophthalmos ENT: moist mucous membranes, no thyromegaly, no cervical lymphadenopathy Cardiovascular: RRR, No MRG Respiratory: CTA B Gastrointestinal: abdomen soft, NT, ND, BS+ Musculoskeletal: no deformities, strength intact in all 4 Skin: moist, warm, no rashes Neurological: no tremor with outstretched hands, DTR normal  in all 4  ASSESSMENT: 1. DM2, non-insulin-dependent, uncontrolled, without complications - tested neg for DM1: Component     Latest Ref Rng 05/28/2015  C-Peptide     0.80 - 3.90 ng/mL 2.88  Glucose, Fasting     65 - 99 mg/dL 161 (H)  Glutamic Acid Decarb Ab     <5 IU/mL <5  Pancreatic Islet Cell Antibody     < 5 JDF Units <5   PLAN:  1. Patient with long-standing, fairly well controlled diabetes, on Metformin. He was doing great on a vegan diet up to about 6 months ago when he relaxed his diet >> sugars higher. We tried to add Invokana but  he did not tolerated due to increased urination. We are limited in the medicines that we can use as he could not tolerate a sulfonylurea due to severe hypoglycemia and we cannot use a DPP 4 inhibitor or a GLP-1 receptor agonist due to his history of pancreatitis.  He restarted a plant-based diet (vegetarian) >> sugars better. However, they are above goal >> will add Cycloset. - reviewed last HbA1c (7.5% = higher) - I advised him to: Patient Instructions  Please continue: - Metformin 1000 mg 2x a day  Please add: - Cycloset 0.8 mg daily first thing in am. Increase to 1.6 mg after a week.   Please return in 2 months with your sugar log.   - continue checking sugars at different times of the day - check 1-2 times a day - advised for yearly eye exams >> UTD - Return to clinic in 2  mo with sugar log

## 2015-12-03 NOTE — Patient Instructions (Addendum)
Please continue: - Metformin 1000 mg 2x a day  Please add: - Cycloset 0.8 mg daily first thing in am. Increase to 1.6 mg after a week.   Please return in 2 months with your sugar log.

## 2015-12-04 NOTE — Telephone Encounter (Signed)
Please read and advise

## 2015-12-05 ENCOUNTER — Encounter: Payer: Self-pay | Admitting: Internal Medicine

## 2015-12-09 ENCOUNTER — Encounter: Payer: Self-pay | Admitting: Internal Medicine

## 2015-12-10 ENCOUNTER — Encounter: Payer: BLUE CROSS/BLUE SHIELD | Admitting: Family Medicine

## 2015-12-13 ENCOUNTER — Encounter: Payer: Self-pay | Admitting: Internal Medicine

## 2015-12-13 ENCOUNTER — Other Ambulatory Visit: Payer: Self-pay | Admitting: Internal Medicine

## 2015-12-13 DIAGNOSIS — E1165 Type 2 diabetes mellitus with hyperglycemia: Secondary | ICD-10-CM

## 2015-12-13 MED ORDER — CYCLOSET 0.8 MG PO TABS: ORAL_TABLET | ORAL | Status: DC

## 2015-12-17 ENCOUNTER — Encounter: Payer: Self-pay | Admitting: Internal Medicine

## 2015-12-17 ENCOUNTER — Other Ambulatory Visit (INDEPENDENT_AMBULATORY_CARE_PROVIDER_SITE_OTHER): Payer: BLUE CROSS/BLUE SHIELD

## 2015-12-17 DIAGNOSIS — Z0000 Encounter for general adult medical examination without abnormal findings: Secondary | ICD-10-CM | POA: Diagnosis not present

## 2015-12-17 DIAGNOSIS — R7989 Other specified abnormal findings of blood chemistry: Secondary | ICD-10-CM | POA: Diagnosis not present

## 2015-12-17 LAB — LIPID PANEL
Cholesterol: 177 mg/dL (ref 0–200)
HDL: 39 mg/dL — AB (ref >39.00)
NONHDL: 138.27
Total CHOL/HDL Ratio: 5
Triglycerides: 299 mg/dL — ABNORMAL HIGH (ref 0.0–149.0)
VLDL: 59.8 mg/dL — ABNORMAL HIGH (ref 0.0–40.0)

## 2015-12-17 LAB — POC URINALSYSI DIPSTICK (AUTOMATED)
BILIRUBIN UA: NEGATIVE
KETONES UA: NEGATIVE
LEUKOCYTES UA: NEGATIVE
Nitrite, UA: NEGATIVE
PH UA: 6
SPEC GRAV UA: 1.02
Urobilinogen, UA: 0.2

## 2015-12-17 LAB — CBC WITH DIFFERENTIAL/PLATELET
BASOS PCT: 0.9 % (ref 0.0–3.0)
Basophils Absolute: 0.1 10*3/uL (ref 0.0–0.1)
EOS PCT: 3.6 % (ref 0.0–5.0)
Eosinophils Absolute: 0.3 10*3/uL (ref 0.0–0.7)
HEMATOCRIT: 38.6 % — AB (ref 39.0–52.0)
HEMOGLOBIN: 12.9 g/dL — AB (ref 13.0–17.0)
Lymphocytes Relative: 28.3 % (ref 12.0–46.0)
Lymphs Abs: 2 10*3/uL (ref 0.7–4.0)
MCHC: 33.3 g/dL (ref 30.0–36.0)
MCV: 88.1 fl (ref 78.0–100.0)
MONO ABS: 0.6 10*3/uL (ref 0.1–1.0)
MONOS PCT: 8.2 % (ref 3.0–12.0)
Neutro Abs: 4.2 10*3/uL (ref 1.4–7.7)
Neutrophils Relative %: 59 % (ref 43.0–77.0)
Platelets: 231 10*3/uL (ref 150.0–400.0)
RBC: 4.38 Mil/uL (ref 4.22–5.81)
RDW: 13.2 % (ref 11.5–15.5)
WBC: 7.2 10*3/uL (ref 4.0–10.5)

## 2015-12-17 LAB — MICROALBUMIN / CREATININE URINE RATIO
Creatinine,U: 115.9 mg/dL
Microalb Creat Ratio: 27.3 mg/g (ref 0.0–30.0)
Microalb, Ur: 31.6 mg/dL — ABNORMAL HIGH (ref 0.0–1.9)

## 2015-12-17 LAB — BASIC METABOLIC PANEL
BUN: 10 mg/dL (ref 6–23)
CHLORIDE: 107 meq/L (ref 96–112)
CO2: 22 mEq/L (ref 19–32)
Calcium: 10.1 mg/dL (ref 8.4–10.5)
Creatinine, Ser: 1.1 mg/dL (ref 0.40–1.50)
GFR: 79.94 mL/min (ref >60.00)
Glucose, Bld: 209 mg/dL — ABNORMAL HIGH (ref 70–99)
POTASSIUM: 5.2 meq/L — AB (ref 3.5–5.1)
SODIUM: 136 meq/L (ref 135–145)

## 2015-12-17 LAB — TSH: TSH: 4.03 u[IU]/mL (ref 0.35–4.50)

## 2015-12-17 LAB — LDL CHOLESTEROL, DIRECT: Direct LDL: 99 mg/dL

## 2015-12-17 LAB — HEPATIC FUNCTION PANEL
ALBUMIN: 4.6 g/dL (ref 3.5–5.2)
ALT: 30 U/L (ref 0–53)
AST: 25 U/L (ref 0–37)
Alkaline Phosphatase: 41 U/L (ref 39–117)
BILIRUBIN TOTAL: 0.3 mg/dL (ref 0.2–1.2)
Bilirubin, Direct: 0.1 mg/dL (ref 0.0–0.3)
TOTAL PROTEIN: 7.3 g/dL (ref 6.0–8.3)

## 2015-12-17 LAB — HEMOGLOBIN A1C: HEMOGLOBIN A1C: 7.7 % — AB (ref 4.6–6.5)

## 2015-12-18 ENCOUNTER — Encounter: Payer: Self-pay | Admitting: Internal Medicine

## 2015-12-19 ENCOUNTER — Other Ambulatory Visit: Payer: Self-pay | Admitting: *Deleted

## 2015-12-19 MED ORDER — CYCLOSET 0.8 MG PO TABS: ORAL_TABLET | ORAL | Status: DC

## 2015-12-19 NOTE — Telephone Encounter (Signed)
Per Dr Gherghe.  

## 2015-12-21 ENCOUNTER — Other Ambulatory Visit: Payer: Self-pay | Admitting: *Deleted

## 2015-12-21 MED ORDER — METFORMIN HCL 1000 MG PO TABS: ORAL_TABLET | ORAL | Status: DC

## 2015-12-24 ENCOUNTER — Ambulatory Visit (INDEPENDENT_AMBULATORY_CARE_PROVIDER_SITE_OTHER): Payer: BLUE CROSS/BLUE SHIELD | Admitting: Family Medicine

## 2015-12-24 ENCOUNTER — Encounter: Payer: Self-pay | Admitting: Family Medicine

## 2015-12-24 VITALS — BP 118/70 | HR 75 | Temp 98.4°F | Ht 69.0 in | Wt 210.0 lb

## 2015-12-24 DIAGNOSIS — Z23 Encounter for immunization: Secondary | ICD-10-CM

## 2015-12-24 DIAGNOSIS — Z0000 Encounter for general adult medical examination without abnormal findings: Secondary | ICD-10-CM | POA: Diagnosis not present

## 2015-12-24 NOTE — Progress Notes (Signed)
Tana Conch, MD Phone: [phone]  Subjective:  Patient presents today for their annual physical. Chief complaint-noted.   See problem oriented charting- ROS- full  review of systems was completed and negative including No chest pain or shortness of breath. No headache or blurry vision. No wheeze.    The following were reviewed and entered/updated in epic: Past Medical History  Diagnosis Date  . 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 DM w/o Complication Type II 07/05/2007  . HYPERTRIGLYCERIDEMIA 10/14/2010  . Nocturia 07/04/2010  . TOBACCO ABUSE, HX OF 07/31/2009  . Bipolar affective disorder (HCC)   . PILAR CYST 08/03/2007    Cyst in groin-states comes up when blood sugar gets high. Recurs if cannot walk for a week.      Patient Active Problem List   Diagnosis Date Noted  . Bipolar depression (HCC) 03/23/2013    Priority: High  . Diabetes mellitus type 2, controlled (HCC) 07/05/2007    Priority: High  . Asthma 04/25/2013    Priority: Medium  . HYPERTRIGLYCERIDEMIA 10/14/2010    Priority: Medium  . 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 Former smoker 06/27/2015    Priority: Low  . Rectal bleeding 08/22/2014    Priority: Low  . Hx of pancreatitis 02/07/2014    Priority: Low   Past Surgical History  Procedure Laterality Date  . Pilar cystectomy      Family History  Problem Relation Age of Onset  . Diabetes Mother   . Diabetes Father   . Colon cancer Neg Hx   . Stomach cancer Neg Hx   . Melanoma Maternal Uncle   . Pancreatitis Neg Hx   . Heart disease Neg Hx   . Kidney disease Neg Hx   . Liver disease Neg Hx     Medications- reviewed and updated Current Outpatient Prescriptions  Medication Sig Dispense Refill  . CYCLOSET 0.8 MG TABS Take 1 tablet daily in AM. 30 tablet 11  . fenofibrate (TRICOR) 145 MG tablet Take 1 tablet (145 mg total) by mouth daily. 90 tablet 3  . lithium 300 MG capsule Take by mouth. 1 every morning , 4 every evening    .  metFORMIN (GLUCOPHAGE) 1000 MG tablet TAKE 1 BY MOUTH TWICE DAILY 180 tablet 1  . SAPHRIS 10 MG SUBL Take 1 tablet by mouth at bedtime.   0   No current facility-administered medications for this visit.    Allergies-reviewed and updated Allergies  Allergen Reactions  . Divalproex Sodium Other (See Comments)    unknown  . Methylphenidate Hcl     Aggravate bipolar  . Oxycodone Hcl     REACTION: hallucinations  . Paroxetine     Aggravate bipolar disorder    Social History   Social History  . Marital Status: Single    Spouse Name: N/A  . Number of Children: N/A  . Years of Education: N/A   Social History Main Topics  . Smoking status: Former Smoker -- 2.50 packs/day for 10 years    Types: Cigarettes    Quit date: 10/27/2006  . Smokeless tobacco: Never Used  . Alcohol Use: No     Comment: none at all  . Drug Use: No  . Sexual Activity: Not Asked   Other Topics Concern  . None   Social History Narrative   Single. Not dating currently. No kids.    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 Oropharynx normal Neck: no thyromegaly CV: RRR no murmurs rubs or gallops Lungs: CTAB no crackles, wheeze, rhonchi Abdomen: soft/nontender/nondistended/normal bowel sounds. No rebound or guarding.  Ext: no edema Skin: warm, dry Neuro: grossly normal, moves all extremities, PERRLA  Assessment/Plan:  40 y.o. male presenting for annual physical.  Health Maintenance counseling: 1. Anticipatory guidance: Patient counseled regarding regular dental exams, eye exams, wearing seatbelts.  2. Risk factor reduction:  Advised patient of need for regular exercise and diet rich and  fruits and vegetables to reduce risk of heart attack and stroke. Had been doing ok until about 8 months ago. Plans to restart walking 30 minutes a day. Has been fearful due to prior hypogclyemia whenon glipizide but no longer on. States trying to do vegan/vegetarian.  3. Immunizations/screenings/ancillary studies- given pneumovax   Immunization History  Administered Date(s) Administered  . Influenza Split 11/27/2011, 07/19/2012  . Influenza Whole 07/29/2008, 07/26/2009, 07/22/2010  . Influenza,inj,Quad PF,36+ Mos 08/15/2013, 06/27/2015  . Influenza-Unspecified 08/31/2014  . Pneumococcal Conjugate-13 08/31/2014  . Td 07/22/2010  4. Prostate cancer screening- no family history- start age 62  5. Colon cancer screening - no family history start age 35 6. testicular cancer screening- advised monthly self exams  Diabetes- metformin 1 g BID, Lantus 10 units- managed through Dr. Lafe Garin. We discussed some lifestyle modifications- mainly sedentary activity that needs to be corrected Lab Results  Component Value Date   HGBA1C 7.7* 12/17/2015   Bipolar- stable through Dr. Jennelle Human with lithium and saphris  Hypertriglyceridemia- slight poor control despite fenofibrate . Far better than 1800 years ago.  Lab Results  Component Value Date   CHOL 177 12/17/2015   HDL 39.00* 12/17/2015   LDLCALC 67 02/02/2014   LDLDIRECT 99.0 12/17/2015   TRIG 299.0* 12/17/2015   CHOLHDL 5 12/17/2015   Asthma- stable without medicine  Back Acne- on bactrim through dermatology  Return in about 1 year (around 12/23/2016) for physical. Return precautions advised.   Pneumovax 23 to be given by [place]

## 2015-12-24 NOTE — Patient Instructions (Addendum)
Things look good overall except for the a1c which you are already working on. This is affecting your kidneys some so we really need to get the sugar level down.  I would restart your exercise program. Triglycerides look ok in comparison to the >1000 you previously had. Glad asthma is doing well  Pneumovax before you leave

## 2016-02-11 ENCOUNTER — Encounter: Payer: Self-pay | Admitting: Internal Medicine

## 2016-02-11 ENCOUNTER — Ambulatory Visit (INDEPENDENT_AMBULATORY_CARE_PROVIDER_SITE_OTHER): Payer: BLUE CROSS/BLUE SHIELD | Admitting: Internal Medicine

## 2016-02-11 VITALS — BP 108/68 | HR 58 | Temp 98.1°F | Ht 69.0 in | Wt 211.5 lb

## 2016-02-11 DIAGNOSIS — E1165 Type 2 diabetes mellitus with hyperglycemia: Secondary | ICD-10-CM | POA: Insufficient documentation

## 2016-02-11 MED ORDER — METFORMIN HCL 1000 MG PO TABS: ORAL_TABLET | ORAL | Status: DC

## 2016-02-11 MED ORDER — PIOGLITAZONE HCL 15 MG PO TABS: 15.0000 mg | ORAL_TABLET | Freq: Every day | ORAL | Status: DC

## 2016-02-11 NOTE — Patient Instructions (Signed)
Please move all the Metformin at dinnertme (2000 mg).  If sugars are still high after 1 week of this regimen, please start Actos 15 mg daily in am.  Continue Cycloset 0.8 mg daily in am.  Please return in 1.5 months with your sugar log.

## 2016-02-11 NOTE — Progress Notes (Signed)
Pre visit review using our clinic review tool, if applicable. No additional management support is needed unless otherwise documented below in the visit note. 

## 2016-02-11 NOTE — Progress Notes (Signed)
Patient ID: Cody DerryVincent P Heckstall, male   DOB: [birth year], 40 y.o.   MRN: [id number]  HPI: Cody Short is a 40 y.o.-year-old male, returning for follow-up for DM 2, dx in ~2010, insulin-dependent, uncontrolled, without complications. He is here with his mother who offers part of the history. Last visit 2 mo ago.  Pt was eating a vegetarian- vegan diet >> now added meats. Also, he quit his job a week before. Less exercise now.  Last hemoglobin A1c was: Lab Results  Component Value Date   HGBA1C 7.7* 12/17/2015   HGBA1C 7.5 10/08/2015   HGBA1C 6.7 05/28/2015   Pt is on a regimen of: - Metformin 1000 mg 2x a day, with meals  - Cycloset 0.8 mg daily in am He was on Lantus 10 units at bedtime >> stopped 06/2015 as he was doing great on the vegan diet. He tried Glipizide >> hypoglycemia in the 40s repeatedly. Lowest: 25. He would not want to take this again.  We tried Invokana >> bothersome urination >> had to stop. He had pancreatitis 2/2 Depakote and HTG in the past..  Pt checks his sugars 1-2x a day and they are better:  - am: 118-150, 180 >> 93-109 >> 160-220 >> 136-154 >> 124 (exercise)-174, 195 - 2h after b'fast: n/c - before lunch: n/c >> 96-115 >> 129-160, 190 >> n/c >> 163 - 2h after lunch: n/c >> 143-189 >> n/c - before dinner: n/c >> 81-113 >> 165 >> 210  - 2h after dinner: 110-120, if not exercising 200-300s after exercising >> 98-160, 183 >> 85-184, 288 >> 126-153, 238 >> n/c - bedtime: n/c >> 129-286 >> 120, 169 - nighttime: n/c No lows. Lowest sugar was 110 >> 120 >> 124; he has hypoglycemia awareness at 70.  Highest sugar was 350 >> 183 >> 238 (hot chocolate) >> 191.  Glucometer: FreeStyle  - no CKD, last BUN/creatinine:  Lab Results  Component Value Date   BUN 10 12/17/2015   CREATININE 1.10 12/17/2015   - last set of lipids: Lab Results  Component Value Date   CHOL 177 12/17/2015   HDL 39.00* 12/17/2015   LDLCALC 67 02/02/2014   LDLDIRECT 99.0  12/17/2015   TRIG 299.0* 12/17/2015   CHOLHDL 5 12/17/2015  On Fenofibrate.  - last eye exam was on 08/20/2015. No DR.  - + numbness; no tingling in his feet. He has recurrent furunculosis - groin.  In 2010, he was on Depakote >> pancreatitis >> discharged on insulin >> he stopped insulin >> restarted in 01/2015 >> stopped again 06/2015.  ROS: Constitutional: + weight gain,no fatigue, no subjective hyperthermia/hypothermia, no nocturia Eyes: no blurry vision, no xerophthalmia ENT: no sore throat, no nodules palpated in throat, no dysphagia/odynophagia, no hoarseness Cardiovascular: no CP/SOB/palpitations/leg swelling Respiratory: no cough/SOB Gastrointestinal: no N/V/D/C Musculoskeletal: no muscle/joint aches Skin: no rashes Neurological: no tremors/numbness/tingling/dizziness  I reviewed pt's medications, allergies, PMH, social hx, family hx, and changes were documented in the history of present illness. Otherwise, unchanged from my initial visit note.  Past Medical History  Diagnosis Date  . 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 DM w/o Complication Type II 07/05/2007  . HYPERTRIGLYCERIDEMIA 10/14/2010  . Nocturia 07/04/2010  . TOBACCO ABUSE, HX OF 07/31/2009  . Bipolar affective disorder (HCC)   . PILAR CYST 08/03/2007    Cyst in groin-states comes up when blood sugar gets high. Recurs if cannot walk for a week.      Past Surgical History  Procedure Laterality Date  .  Pilar cystectomy     History   Social History  . Marital Status: Single    Spouse Name: N/A  . Number of Children: 0   Occupational History  .    Social History Main Topics  . Smoking status: Former Smoker - quit in 2010  . Smokeless tobacco: Never Used  . Alcohol Use: No     Comment: none at all  . Drug Use: No   Current Outpatient Prescriptions on File Prior to Visit  Medication Sig Dispense Refill  . CYCLOSET 0.8 MG TABS Take 1 tablet daily in AM. 30 tablet 11  . fenofibrate (TRICOR) 145 MG tablet Take  1 tablet (145 mg total) by mouth daily. 90 tablet 3  . lithium 300 MG capsule Take by mouth. 1 every morning , 4 every evening    . metFORMIN (GLUCOPHAGE) 1000 MG tablet TAKE 1 BY MOUTH TWICE DAILY 180 tablet 1  . SAPHRIS 10 MG SUBL Take 15 mg by mouth at bedtime.   0  . Sulfamethoxazole-Trimethoprim (SULFAMETHOXAZOLE-TMP DS PO) Take by mouth.     No current facility-administered medications on file prior to visit.   Allergies  Allergen Reactions  . Divalproex Sodium Other (See Comments)    unknown  . Methylphenidate Hcl     Aggravate bipolar  . Oxycodone Hcl     REACTION: hallucinations  . Paroxetine     Aggravate bipolar disorder   Family History  Problem Relation Age of Onset  . Diabetes Mother   . Diabetes Father   . Colon cancer Neg Hx   . Stomach cancer Neg Hx   . Melanoma Maternal Uncle   . Pancreatitis Neg Hx   . Heart disease Neg Hx   . Kidney disease Neg Hx   . Liver disease Neg Hx    PE: BP 108/68 mmHg  Pulse 58  Temp(Src) 98.1 F (36.7 C) (Oral)  Ht  (1.753 m)  Wt 211 lb 8 oz (95.936 kg)  BMI 31.22 kg/m2  SpO2 96% Body mass index is 31.22 kg/(m^2). Wt Readings from Last 3 Encounters:  02/11/16 211 lb 8 oz (95.936 kg)  12/24/15 210 lb (95.255 kg)  12/03/15 203 lb 12.8 oz (92.443 kg)   Constitutional: overweight, in NAD Eyes: PERRLA, EOMI, no exophthalmos ENT: moist mucous membranes, no thyromegaly, no cervical lymphadenopathy Cardiovascular: RRR, No MRG Respiratory: CTA B Gastrointestinal: abdomen soft, NT, ND, BS+ Musculoskeletal: no deformities, strength intact in all 4 Skin: moist, warm, no rashes Neurological: no tremor with outstretched hands, DTR normal in all 4  ASSESSMENT: 1. DM2, non-insulin-dependent, uncontrolled, without complications - tested neg for DM1: Component     Latest Ref Rng 05/28/2015  C-Peptide     0.80 - 3.90 ng/mL 2.88  Glucose, Fasting     65 - 99 mg/dL 295 (H)  Glutamic Acid Decarb Ab     <5 IU/mL <5   Pancreatic Islet Cell Antibody     < 5 JDF Units <5   PLAN:  1. Patient with long-standing, fairly well controlled diabetes, on Metformin. He was doing great on a vegan diet but he relaxed his diet >> sugars higher. We tried to add Invokana but he did not tolerated due to increased urination. We are limited in the medicines that we can use as he could not tolerate a sulfonylurea due to severe hypoglycemia and we cannot use a DPP 4 inhibitor or a GLP-1 receptor agonist due to his history of pancreatitis. We will move all  Metformin at night and try to add Actos low dose if sugars not improving after this. It is difficult to evaluate his control completely as he is only checking sugars in am when he wakes up >> advised to rotate checktimes. - reviewed last HbA1c (7.7% = higher) - I advised him to: Patient Instructions  Please move all the Metformin at dinnertme (2000 mg).  If sugars are still high after 1 week of this regimen, please start Actos 15 mg daily in am.  Continue Cycloset 0.8 mg daily in am.  Please return in 1.5 months with your sugar log.   - continue checking sugars at different times of the day - check 1-2 times a day - advised for yearly eye exams >> UTD - Return to clinic in 1.5 mo with sugar log

## 2016-02-17 ENCOUNTER — Encounter: Payer: Self-pay | Admitting: Internal Medicine

## 2016-02-22 ENCOUNTER — Other Ambulatory Visit: Payer: Self-pay | Admitting: Internal Medicine

## 2016-02-22 ENCOUNTER — Encounter: Payer: Self-pay | Admitting: Internal Medicine

## 2016-02-22 DIAGNOSIS — E1165 Type 2 diabetes mellitus with hyperglycemia: Secondary | ICD-10-CM

## 2016-02-22 MED ORDER — INSULIN PEN NEEDLE 32G X 4 MM MISC: Status: DC

## 2016-02-22 MED ORDER — INSULIN GLARGINE 100 UNIT/ML SOLOSTAR PEN: 10.0000 [IU] | PEN_INJECTOR | Freq: Every day | SUBCUTANEOUS | Status: DC

## 2016-03-05 ENCOUNTER — Encounter: Payer: Self-pay | Admitting: Internal Medicine

## 2016-03-18 ENCOUNTER — Encounter: Payer: Self-pay | Admitting: Internal Medicine

## 2016-03-27 ENCOUNTER — Ambulatory Visit (INDEPENDENT_AMBULATORY_CARE_PROVIDER_SITE_OTHER): Payer: BLUE CROSS/BLUE SHIELD | Admitting: Internal Medicine

## 2016-03-27 ENCOUNTER — Encounter: Payer: Self-pay | Admitting: Internal Medicine

## 2016-03-27 VITALS — BP 126/84 | HR 71 | Wt 215.0 lb

## 2016-03-27 DIAGNOSIS — E1165 Type 2 diabetes mellitus with hyperglycemia: Secondary | ICD-10-CM | POA: Diagnosis not present

## 2016-03-27 LAB — POCT GLYCOSYLATED HEMOGLOBIN (HGB A1C): HEMOGLOBIN A1C: 7.4

## 2016-03-27 NOTE — Progress Notes (Signed)
Patient ID: Cody Short, male   DOB: 1976/08/15, 40 y.o.   MRN: [id number]  HPI: Cody Short is a 40 y.o.-year-old male, returning for follow-up for DM 2, dx in ~2010, non-insulin-dependent, uncontrolled, without complications. He is here with his mother who offers part of the history. Last visit 1.5 mo ago.  He started to exercise >> walking on treadmill, 30-65 min - at night.  Last hemoglobin A1c was: Lab Results  Component Value Date   HGBA1C 7.7* 12/17/2015   HGBA1C 7.5 10/08/2015   HGBA1C 6.7 05/28/2015   Pt is on a regimen of: - Metformin 1000 mg 2x a day, with meals  - Cycloset 0.8 mg daily in am We started Lantus (26 units) at last visit - not in last 10 days (!) as sugars have been much better He was on Lantus 10 units at bedtime >> stopped 06/2015 as he was doing great on the vegan diet. He tried Glipizide >> hypoglycemia in the 40s repeatedly. Lowest: 25. He would not want to take this again.  We tried Invokana >> bothersome urination >> had to stop. He had pancreatitis 2/2 Depakote and HTG in the past. He stopped Actos b/c stomach pain.   Pt checks his sugars 1-2x a day and they are much better after starting to exercise:  - am: 118-150, 180 >> 93-109 >> 160-220 >> 136-154 >> 124 (exercise)-174, 195 >> n/c (wakes up late) - 2h after b'fast: n/c - before lunch: n/c >> 96-115 >> 129-160, 190 >> n/c >> 163 >> 114-145 - 2h after lunch: n/c >> 143-189 >> n/c >> 85-182 - before dinner: n/c >> 81-113 >> 165 >> 210 >> 93 - 2h after dinner: 98-160, 183 >> 85-184, 288 >> 126-153, 238 >> n/c  - bedtime: n/c >> 129-286 >> 120, 169 >> 89-138 - nighttime: n/c No lows. Lowest sugar was 110 >> 120 >> 124 >> 85; he has hypoglycemia awareness at 70.  Highest sugar was 350 >> 183 >> 238 (hot chocolate) >> 191 >> 209.  Glucometer: FreeStyle  - no CKD, last BUN/creatinine:  Lab Results  Component Value Date   BUN 10 12/17/2015   CREATININE 1.10 12/17/2015   -  last set of lipids: Lab Results  Component Value Date   CHOL 177 12/17/2015   HDL 39.00* 12/17/2015   LDLCALC 67 02/02/2014   LDLDIRECT 99.0 12/17/2015   TRIG 299.0* 12/17/2015   CHOLHDL 5 12/17/2015  On Fenofibrate.  - last eye exam was on 08/20/2015. No DR.  - + numbness; no tingling in his feet. He has recurrent furunculosis - groin.  In 2010, he was on Depakote >> pancreatitis >> discharged on insulin >> he stopped insulin >> restarted in 01/2015 >> stopped again 06/2015.  ROS: Constitutional: + weight gain,no fatigue, no subjective hyperthermia/hypothermia, no nocturia Eyes: no blurry vision, no xerophthalmia ENT: no sore throat, no nodules palpated in throat, no dysphagia/odynophagia, no hoarseness Cardiovascular: no CP/SOB/palpitations/leg swelling Respiratory: no cough/SOB Gastrointestinal: no N/V/D/C Musculoskeletal: no muscle/joint aches Skin: no rashes Neurological: no tremors/numbness/tingling/dizziness  I reviewed pt's medications, allergies, PMH, social hx, family hx, and changes were documented in the history of present illness. Otherwise, unchanged from my initial visit note.  Past Medical History  Diagnosis Date  . 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 DM w/o Complication Type II 07/05/2007  . HYPERTRIGLYCERIDEMIA 10/14/2010  . Nocturia 07/04/2010  . TOBACCO ABUSE, HX OF 07/31/2009  . Bipolar affective disorder (HCC)   . PILAR CYST 08/03/2007  Cyst in groin-states comes up when blood sugar gets high. Recurs if cannot walk for a week.      Past Surgical History  Procedure Laterality Date  . Pilar cystectomy     History   Social History  . Marital Status: Single    Spouse Name: N/A  . Number of Children: 0   Occupational History  .    Social History Main Topics  . Smoking status: Former Smoker - quit in 2010  . Smokeless tobacco: Never Used  . Alcohol Use: No     Comment: none at all  . Drug Use: No   Current Outpatient Prescriptions on File Prior to  Visit  Medication Sig Dispense Refill  . CYCLOSET 0.8 MG TABS Take 1 tablet daily in AM. 30 tablet 11  . fenofibrate (TRICOR) 145 MG tablet Take 1 tablet (145 mg total) by mouth daily. 90 tablet 3  . Insulin Glargine (LANTUS SOLOSTAR) 100 UNIT/ML Solostar Pen Inject 10 Units into the skin at bedtime. (Patient taking differently: Inject 26 Units into the skin at bedtime. ) 5 pen 2  . Insulin Pen Needle 32G X 4 MM MISC Use once a day 100 each 11  . lithium 300 MG capsule Take by mouth. 1 every morning , 4 every evening    . metFORMIN (GLUCOPHAGE) 1000 MG tablet Take by mouth 2000 mg daily with dinner 180 tablet 1  . SAPHRIS 10 MG SUBL Take 15 mg by mouth at bedtime. Reported on 03/27/2016  0  . Sulfamethoxazole-Trimethoprim (SULFAMETHOXAZOLE-TMP DS PO) Take by mouth 2 (two) times daily.     . pioglitazone (ACTOS) 15 MG tablet Take 1 tablet (15 mg total) by mouth daily. (Patient not taking: Reported on 03/27/2016) 30 tablet 2   No current facility-administered medications on file prior to visit.   Allergies  Allergen Reactions  . Divalproex Sodium Other (See Comments)    unknown  . Methylphenidate Hcl     Aggravate bipolar  . Oxycodone Hcl     REACTION: hallucinations  . Paroxetine     Aggravate bipolar disorder   Family History  Problem Relation Age of Onset  . Diabetes Mother   . Diabetes Father   . Colon cancer Neg Hx   . Stomach cancer Neg Hx   . Melanoma Maternal Uncle   . Pancreatitis Neg Hx   . Heart disease Neg Hx   . Kidney disease Neg Hx   . Liver disease Neg Hx    PE: BP 126/84 mmHg  Pulse 71  Wt 215 lb (97.523 kg)  SpO2 94% Body mass index is 31.74 kg/(m^2). Wt Readings from Last 3 Encounters:  03/27/16 215 lb (97.523 kg)  02/11/16 211 lb 8 oz (95.936 kg)  12/24/15 210 lb (95.255 kg)   Constitutional: overweight, in NAD Eyes: PERRLA, EOMI, no exophthalmos ENT: moist mucous membranes, no thyromegaly, no cervical lymphadenopathy Cardiovascular: RRR, No  MRG Respiratory: CTA B Gastrointestinal: abdomen soft, NT, ND, BS+ Musculoskeletal: no deformities, strength intact in all 4 Skin: moist, warm, no rashes Neurological: no tremor with outstretched hands, DTR normal in all 4  ASSESSMENT: 1. DM2, non-insulin-dependent, uncontrolled, without complications - tested neg for DM1: Component     Latest Ref Rng 05/28/2015  C-Peptide     0.80 - 3.90 ng/mL 2.88  Glucose, Fasting     65 - 99 mg/dL 161 (H)  Glutamic Acid Decarb Ab     <5 IU/mL <5  Pancreatic Islet Cell Antibody     <  5 JDF Units <5   PLAN:  1. Patient with long-standing, fairly well controlled diabetes, on Metformin. He was doing great on a vegan diet but he does not want to continue >> sugars have increased and we tried several meds: - We tried to add Invokana but he did not tolerated due to increased urination.  - He could not tolerate a sulfonylurea due to severe hypoglycemia  - We cannot use a DPP 4 inhibitor or a GLP-1 receptor agonist due to his history of pancreatitis.  - He had to stop Actos 2/2 abd. pain - we started insulin since last visit >> this helped , but sugars much better after started to exercise >> he could stop - reviewed last HbA1c (7.7% = higher) - I advised him to: Patient Instructions  Please stay off the Lantus for now.  If you need to restart it, use a lower dose: 15 units, for e.g.  Continue: - Metformin 1000 mg 2x a day, with meals  - Cycloset 0.8 mg daily in am  Please return in 3 months with your sugar log.   - continue checking sugars at different times of the day - check 1-2 times a day - advised for yearly eye exams >> UTD - checked HbA1c >> 7.4% (better) - Return to clinic in 3 mo with sugar log

## 2016-03-27 NOTE — Patient Instructions (Signed)
Please stay off the Lantus for now.  If you need to restart it, use a lower dose: 15 units, for e.g.  Continue: - Metformin 1000 mg 2x a day, with meals  - Cycloset 0.8 mg daily in am  Please return in 3 months with your sugar log.

## 2016-04-23 ENCOUNTER — Encounter: Payer: Self-pay | Admitting: Internal Medicine

## 2016-04-23 ENCOUNTER — Other Ambulatory Visit: Payer: Self-pay | Admitting: Internal Medicine

## 2016-04-23 DIAGNOSIS — E1165 Type 2 diabetes mellitus with hyperglycemia: Secondary | ICD-10-CM

## 2016-04-23 MED ORDER — INSULIN ASPART 100 UNIT/ML FLEXPEN: PEN_INJECTOR | SUBCUTANEOUS | Status: DC

## 2016-04-24 ENCOUNTER — Encounter: Payer: Self-pay | Admitting: Internal Medicine

## 2016-05-02 ENCOUNTER — Encounter: Payer: Self-pay | Admitting: Internal Medicine

## 2016-05-10 ENCOUNTER — Encounter: Payer: Self-pay | Admitting: Internal Medicine

## 2016-05-26 ENCOUNTER — Ambulatory Visit (INDEPENDENT_AMBULATORY_CARE_PROVIDER_SITE_OTHER): Payer: BLUE CROSS/BLUE SHIELD | Admitting: Internal Medicine

## 2016-05-26 ENCOUNTER — Encounter: Payer: Self-pay | Admitting: Internal Medicine

## 2016-05-26 VITALS — BP 120/82 | HR 65 | Ht 70.5 in | Wt 215.0 lb

## 2016-05-26 DIAGNOSIS — E1165 Type 2 diabetes mellitus with hyperglycemia: Secondary | ICD-10-CM | POA: Diagnosis not present

## 2016-05-26 MED ORDER — INSULIN GLARGINE 100 UNIT/ML SOLOSTAR PEN: 20.0000 [IU] | PEN_INJECTOR | Freq: Every day | SUBCUTANEOUS | 5 refills | Status: DC

## 2016-05-26 MED ORDER — GLIPIZIDE 5 MG PO TABS: 2.5000 mg | ORAL_TABLET | Freq: Two times a day (BID) | ORAL | 3 refills | Status: DC

## 2016-05-26 NOTE — Patient Instructions (Signed)
Please continue: - Metformin 1000 mg 2x a day, with meals   Stop: - Cycloset 0.8 mg daily in am  Please increase: - Lantus to 20 units at bedtime  Please start: - Glipizide 2.5 mg 2x a day before meals  Please return in 1.5 months with your sugar log.

## 2016-05-26 NOTE — Progress Notes (Signed)
Patient ID: Cody Short, male   DOB: 1976/08/06, 40 y.o.   MRN: [id number]  HPI: Cody Short is a 40 y.o.-year-old male, returning for follow-up for DM 2, dx in ~2010, non-insulin-dependent, uncontrolled, without complications. He is here with his mother who offers part of the history. Last visit 1.5 mo ago.  He started to exercise >> walking on treadmill, 45 min-3h - at night.  Last hemoglobin A1c was: Lab Results  Component Value Date   HGBA1C 7.4 03/27/2016   HGBA1C 7.7 (H) 12/17/2015   HGBA1C 7.5 10/08/2015   Pt is on a regimen of: - Metformin 1000 mg 2x a day, with meals  - Cycloset 0.8 mg daily in am - Lantus 15 units at bedtime - NovoLog (as needed) He tried Glipizide >> hypoglycemia in the 40s repeatedly. Lowest: 25. He would not want to take this again.  We tried Invokana >> bothersome urination >> had to stop. He had pancreatitis 2/2 Depakote and HTG in the past. He stopped Actos b/c stomach pain.   Pt checks his sugars 1-2x a day: - am: 118-150, 180 >> 93-109 >> 160-220 >> 136-154 >> 124 (exercise)-174, 195 >> n/c (wakes up late)  - 2h after b'fast: n/c - before lunch: n/c >> 96-115 >> 129-160, 190 >> n/c >> 163 >> 114-145 >> 166-214, 287 - 2h after lunch: n/c >> 143-189 >> n/c >> 85-182 >> 197-218 - before dinner: n/c >> 81-113 >> 165 >> 210 >> 93 >> n/c - 2h after dinner: 98-160, 183 >> 85-184, 288 >> 126-153, 238 >> n/c >> 197-350 - bedtime: n/c >> 129-286 >> 120, 169 >> 89-138 >> 131, 145-305 - nighttime: n/c No lows. Lowest sugar was 110 >> 120 >> 124 >> 85 >> 131; he has hypoglycemia awareness at 70.  Highest sugar was 350 >> 183 >> 238 (hot chocolate) >> 191 >> 209 >> 332.  Glucometer: FreeStyle  - no CKD, last BUN/creatinine:  Lab Results  Component Value Date   BUN 10 12/17/2015   CREATININE 1.10 12/17/2015   - last set of lipids: Lab Results  Component Value Date   CHOL 177 12/17/2015   HDL 39.00 (L) 12/17/2015   LDLCALC 67  02/02/2014   LDLDIRECT 99.0 12/17/2015   TRIG 299.0 (H) 12/17/2015   CHOLHDL 5 12/17/2015  On Fenofibrate.  - last eye exam was on 08/20/2015. No DR.  - + numbness; no tingling in his feet. He has recurrent furunculosis - groin.  In 2010, he was on Depakote >> pancreatitis >> discharged on insulin >> he stopped insulin >> restarted in 01/2015 >> stopped again 06/2015.  ROS: Constitutional: + weight gain,no fatigue, no subjective hyperthermia/hypothermia, no nocturia Eyes: no blurry vision, no xerophthalmia ENT: no sore throat, no nodules palpated in throat, no dysphagia/odynophagia, no hoarseness Cardiovascular: no CP/SOB/palpitations/leg swelling Respiratory: no cough/SOB Gastrointestinal: no N/V/D/C Musculoskeletal: no muscle/joint aches Skin: no rashes Neurological: no tremors/numbness/tingling/dizziness  I reviewed pt's medications, allergies, PMH, social hx, family hx, and changes were documented in the history of present illness. Otherwise, unchanged from my initial visit note.  Past Medical History:  Diagnosis Date  . 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 Bipolar affective disorder (HCC)   . DM w/o Complication Type II 07/05/2007  . HYPERTRIGLYCERIDEMIA 10/14/2010  . Nocturia 07/04/2010  . PILAR CYST 08/03/2007   Cyst in groin-states comes up when blood sugar gets high. Recurs if cannot walk for a week.     . TOBACCO ABUSE, HX OF 07/31/2009  Past Surgical History:  Procedure Laterality Date  . pilar cystectomy     History   Social History  . Marital Status: Single    Spouse Name: N/A  . Number of Children: 0   Occupational History  .    Social History Main Topics  . Smoking status: Former Smoker - quit in 2010  . Smokeless tobacco: Never Used  . Alcohol Use: No     Comment: none at all  . Drug Use: No   Current Outpatient Prescriptions on File Prior to Visit  Medication Sig Dispense Refill  . CYCLOSET 0.8 MG TABS Take 1 tablet daily in AM. 30 tablet 11  .  fenofibrate (TRICOR) 145 MG tablet Take 1 tablet (145 mg total) by mouth daily. 90 tablet 3  . insulin aspart (NOVOLOG FLEXPEN) 100 UNIT/ML FlexPen Inject under skin 10 units as needed for high blood sugar correction 15 mL 1  . Insulin Glargine (LANTUS SOLOSTAR) 100 UNIT/ML Solostar Pen Inject 10 Units into the skin at bedtime. (Patient taking differently: Inject 26 Units into the skin at bedtime. ) 5 pen 2  . Insulin Pen Needle 32G X 4 MM MISC Use once a day 100 each 11  . lithium 300 MG capsule Take by mouth. 1 every morning , 4 every evening    . metFORMIN (GLUCOPHAGE) 1000 MG tablet Take by mouth 2000 mg daily with dinner 180 tablet 1  . pioglitazone (ACTOS) 15 MG tablet Take 1 tablet (15 mg total) by mouth daily. 30 tablet 2  . SAPHRIS 10 MG SUBL Take 15 mg by mouth at bedtime. Reported on 03/27/2016  0  . Sulfamethoxazole-Trimethoprim (SULFAMETHOXAZOLE-TMP DS PO) Take by mouth daily.      No current facility-administered medications on file prior to visit.    Allergies  Allergen Reactions  . Divalproex Sodium Other (See Comments)    unknown  . Methylphenidate Hcl     Aggravate bipolar  . Oxycodone Hcl     REACTION: hallucinations  . Paroxetine     Aggravate bipolar disorder   Family History  Problem Relation Age of Onset  . Diabetes Mother   . Diabetes Father   . Colon cancer Neg Hx   . Stomach cancer Neg Hx   . Melanoma Maternal Uncle   . Pancreatitis Neg Hx   . Heart disease Neg Hx   . Kidney disease Neg Hx   . Liver disease Neg Hx    PE: BP 120/82 (BP Location: Left Arm, Patient Position: Sitting)   Pulse 65   Ht 5' 10.5" (1.791 m)   Wt 215 lb (97.5 kg)   SpO2 95%   BMI 30.41 kg/m  Body mass index is 30.41 kg/m. Wt Readings from Last 3 Encounters:  05/26/16 215 lb (97.5 kg)  03/27/16 215 lb (97.5 kg)  02/11/16 211 lb 8 oz (95.9 kg)   Constitutional: overweight, in NAD Eyes: PERRLA, EOMI, no exophthalmos ENT: moist mucous membranes, no thyromegaly, no cervical  lymphadenopathy Cardiovascular: RRR, No MRG Respiratory: CTA B Gastrointestinal: abdomen soft, NT, ND, BS+ Musculoskeletal: no deformities, strength intact in all 4 Skin: moist, warm, no rashes Neurological: no tremor with outstretched hands, DTR normal in all 4  ASSESSMENT: 1. DM2, non-insulin-dependent, uncontrolled, without long term complications, but with hyperglycemia - tested neg for DM1: Component     Latest Ref Rng 05/28/2015  C-Peptide     0.80 - 3.90 ng/mL 2.88  Glucose, Fasting     65 - 99 mg/dL 161 (  H)  Glutamic Acid Decarb Ab     <5 IU/mL <5  Pancreatic Islet Cell Antibody     < 5 JDF Units <5   PLAN:  1. Patient with long-standing, prev. fairly well controlled diabetes, now much worse 2/2 dietary indiscretions. He was doing great on a vegan diet but he does not want to continue >> sugars have increased and we tried several meds: - We tried to add Invokana but he did not tolerated due to increased urination.  - He could not tolerate a sulfonylurea due to severe hypoglycemia >> agrees to retry though as sugars are high - We cannot use a DPP 4 inhibitor or a GLP-1 receptor agonist due to his history of pancreatitis.  - He had to stop Actos 2/2 abd. pain - we started insulin since last visit >> this helped , but sugars still high >> will increase the dose - will stop Cycloset as not helping much and it is too expensive for him to increase the dose - reviewed last HbA1c (7.4% = better) - I advised him to: Patient Instructions  Please continue: - Metformin 1000 mg 2x a day, with meals   Stop: - Cycloset 0.8 mg daily in am  Please increase: - Lantus to 20 units at bedtime  Please start: - Glipizide 2.5 mg 2x a day before meals  Please return in 1.5 months with your sugar log.   - continue checking sugars at different times of the day - check 1-2 times a day - advised for yearly eye exams >> UTD - Return to clinic in 1.5 mo with sugar log  Carlus Pavlov, MD  PhD [facility]

## 2016-06-09 ENCOUNTER — Other Ambulatory Visit: Payer: Self-pay | Admitting: Internal Medicine

## 2016-06-27 ENCOUNTER — Encounter: Payer: Self-pay | Admitting: Adult Health

## 2016-06-27 ENCOUNTER — Encounter: Payer: Self-pay | Admitting: Internal Medicine

## 2016-06-27 ENCOUNTER — Other Ambulatory Visit: Payer: Self-pay | Admitting: Adult Health

## 2016-06-27 ENCOUNTER — Ambulatory Visit: Payer: Self-pay | Admitting: Family Medicine

## 2016-06-27 ENCOUNTER — Telehealth: Payer: Self-pay | Admitting: Family Medicine

## 2016-06-27 ENCOUNTER — Ambulatory Visit (INDEPENDENT_AMBULATORY_CARE_PROVIDER_SITE_OTHER): Payer: BLUE CROSS/BLUE SHIELD | Admitting: Adult Health

## 2016-06-27 ENCOUNTER — Ambulatory Visit (INDEPENDENT_AMBULATORY_CARE_PROVIDER_SITE_OTHER): Payer: BLUE CROSS/BLUE SHIELD | Admitting: Internal Medicine

## 2016-06-27 ENCOUNTER — Ambulatory Visit (INDEPENDENT_AMBULATORY_CARE_PROVIDER_SITE_OTHER)
Admission: RE | Admit: 2016-06-27 | Discharge: 2016-06-27 | Disposition: A | Discharge status: Home / Self Care | Payer: BLUE CROSS/BLUE SHIELD | Source: Ambulatory Visit | Attending: Adult Health | Admitting: Adult Health

## 2016-06-27 VITALS — BP 132/84 | HR 73 | Ht 70.5 in | Wt 217.0 lb

## 2016-06-27 VITALS — BP 138/90 | HR 73 | Temp 98.4°F | Ht 69.0 in | Wt 218.0 lb

## 2016-06-27 DIAGNOSIS — R1084 Generalized abdominal pain: Secondary | ICD-10-CM

## 2016-06-27 DIAGNOSIS — E1165 Type 2 diabetes mellitus with hyperglycemia: Secondary | ICD-10-CM

## 2016-06-27 LAB — HEPATIC FUNCTION PANEL
ALBUMIN: 4.8 g/dL (ref 3.5–5.2)
ALK PHOS: 46 U/L (ref 39–117)
ALT: 36 U/L (ref 0–53)
AST: 34 U/L (ref 0–37)
Bilirubin, Direct: 0.1 mg/dL (ref 0.0–0.3)
TOTAL PROTEIN: 7.5 g/dL (ref 6.0–8.3)
Total Bilirubin: 0.4 mg/dL (ref 0.2–1.2)

## 2016-06-27 LAB — CBC WITH DIFFERENTIAL/PLATELET
BASOS PCT: 0.5 % (ref 0.0–3.0)
Basophils Absolute: 0.1 10*3/uL (ref 0.0–0.1)
EOS PCT: 1.6 % (ref 0.0–5.0)
Eosinophils Absolute: 0.2 10*3/uL (ref 0.0–0.7)
HCT: 39 % (ref 39.0–52.0)
HEMOGLOBIN: 13.5 g/dL (ref 13.0–17.0)
Lymphocytes Relative: 17 % (ref 12.0–46.0)
Lymphs Abs: 1.9 10*3/uL (ref 0.7–4.0)
MCHC: 34.5 g/dL (ref 30.0–36.0)
MCV: 86.3 fl (ref 78.0–100.0)
MONO ABS: 0.8 10*3/uL (ref 0.1–1.0)
MONOS PCT: 7.3 % (ref 3.0–12.0)
Neutro Abs: 8.3 10*3/uL — ABNORMAL HIGH (ref 1.4–7.7)
Neutrophils Relative %: 73.6 % (ref 43.0–77.0)
Platelets: 248 10*3/uL (ref 150.0–400.0)
RBC: 4.52 Mil/uL (ref 4.22–5.81)
RDW: 13.2 % (ref 11.5–15.5)
WBC: 11.3 10*3/uL — AB (ref 4.0–10.5)

## 2016-06-27 LAB — POC URINALSYSI DIPSTICK (AUTOMATED)
Blood, UA: NEGATIVE
GLUCOSE UA: NEGATIVE
KETONES UA: NEGATIVE
LEUKOCYTES UA: NEGATIVE
Nitrite, UA: NEGATIVE
SPEC GRAV UA: 1.025
UROBILINOGEN UA: 0.2
pH, UA: 5.5

## 2016-06-27 LAB — POCT GLYCOSYLATED HEMOGLOBIN (HGB A1C): Hemoglobin A1C: 7.9

## 2016-06-27 LAB — LIPASE: LIPASE: 33 U/L (ref 11.0–59.0)

## 2016-06-27 NOTE — Patient Instructions (Addendum)
Please continue: - Lantus 20 units at bedtime - Metformin 1000 mg 2x a day, with meals   Increase: - Glipizide to 2.5 mg before b'fast and 5 mg before dinner if you are having a large meal or in the days that you are not exercising.  Please return in 1.5 months with your sugar log.

## 2016-06-27 NOTE — Progress Notes (Addendum)
Patient ID: Cody DerryVincent P Leisner, male   DOB: 12/03/1975, 40 y.o.   MRN: [id number]  HPI: Cody Short is a 40 y.o.-year-old male, returning for follow-up for DM 2, dx in ~2010, non-insulin-dependent, uncontrolled, without complications. He is here with his mother who offers part of the history. Last visit 1.5 mo ago.  Before last visit, he did well with exercise: walking on treadmill, 45 min-3h - at night. He now stopped exercise because he felt that this was not helping at all. Reviewing his sugar log, exercise actually helped quite significantly... 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  Last hemoglobin A1c was: Lab Results  Component Value Date   HGBA1C 7.4 03/27/2016   HGBA1C 7.7 (H) 12/17/2015   HGBA1C 7.5 10/08/2015   Pt is on a regimen of: - Lantus 20 units at bedtime - Metformin 1000 mg 2x a day, with meals  - Glipizide 2.5 mg 2x a day before meals  He tried Glipizide >> hypoglycemia in the 40s repeatedly. Lowest: 25. He would not want to take this again.  We tried Invokana >> bothersome urination >> had to stop. He had pancreatitis 2/2 Depakote and HTG in the past. He stopped Actos b/c stomach pain.  We stopped Cycloset >> inefficient, could not use b/c price.  Pt checks his sugars 1-2x a day: - am: 118-150, 180 >> 93-109 >> 160-220 >> 136-154 >> 124 (exercise)-174, 195 >> n/c (wakes up late) >> 98, 100 - 2h after b'fast: n/c - before lunch: n/c >> 96-115 >> 129-160, 190 >> n/c >> 163 >> 114-145 >> 166-214, 287 >> 130-162, 217 - 2h after lunch: n/c >> 143-189 >> n/c >> 85-182 >> 197-218 >> 212 - before dinner: n/c >> 81-113 >> 165 >> 210 >> 93 >> n/c >> 70 - 2h after dinner: 98-160, 183 >> 85-184, 288 >> 126-153, 238 >> n/c >> 197-350 >> 60-187, 230, 263 - bedtime: n/c >> 129-286 >> 120, 169 >> 89-138 >> 131, 145-305 >> 99-181, 253 - nighttime: n/c No lows. Lowest sugar was 110 >> 120 >> 124 >> 85 >>  131 >> 60; he has hypoglycemia awareness at 70.  Highest sugar was 350 >> 183 >> 238 (hot chocolate) >> 191 >> 209 >> 332 >> 263.  Glucometer: FreeStyle  - no CKD, last BUN/creatinine:  Lab Results  Component Value Date   BUN 10 12/17/2015   CREATININE 1.10 12/17/2015   - last set of lipids: Lab Results  Component Value Date   CHOL 177 12/17/2015   HDL 39.00 (L) 12/17/2015   LDLCALC 67 02/02/2014   LDLDIRECT 99.0 12/17/2015   TRIG 299.0 (H) 12/17/2015   CHOLHDL 5 12/17/2015  On Fenofibrate.  - last eye exam was on 08/20/2015. No DR.  - + numbness; no tingling in his feet. He has recurrent furunculosis - groin.  In 2010, he was on Depakote >> pancreatitis >> discharged on insulin >> he stopped insulin >> restarted in 01/2015 >> stopped again 06/2015.  ROS: Constitutional: No weight gain,no fatigue, no subjective hyperthermia/hypothermia, no nocturia Eyes: no blurry vision, no xerophthalmia ENT: no sore throat, no nodules palpated in throat, no dysphagia/odynophagia, no hoarseness Cardiovascular: no CP/SOB/palpitations/leg swelling Respiratory: no cough/SOB Gastrointestinal: no N/V/D/C Musculoskeletal: no muscle/joint aches Skin: no rashes Neurological: no tremors/numbness/tingling/dizziness  I reviewed pt's medications, allergies, PMH, social hx, family hx, and changes were documented in the history of present illness. Otherwise, unchanged  from my initial visit note.  Past Medical History:  Diagnosis Date  . 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 Bipolar affective disorder (HCC)   . DM w/o Complication Type II 07/05/2007  . HYPERTRIGLYCERIDEMIA 10/14/2010  . Nocturia 07/04/2010  . PILAR CYST 08/03/2007   Cyst in groin-states comes up when blood sugar gets high. Recurs if cannot walk for a week.     . TOBACCO ABUSE, HX OF 07/31/2009   Past Surgical History:  Procedure Laterality Date  . pilar cystectomy     History   Social History  . Marital Status: Single    Spouse Name:  N/A  . Number of Children: 0   Occupational History  .    Social History Main Topics  . Smoking status: Former Smoker - quit in 2010  . Smokeless tobacco: Never Used  . Alcohol Use: No     Comment: none at all  . Drug Use: No   Current Outpatient Prescriptions on File Prior to Visit  Medication Sig Dispense Refill  . fenofibrate (TRICOR) 145 MG tablet Take 1 tablet (145 mg total) by mouth daily. 90 tablet 3  . glipiZIDE (GLUCOTROL) 5 MG tablet Take 0.5 tablets (2.5 mg total) by mouth 2 (two) times daily before a meal. 60 tablet 3  . insulin aspart (NOVOLOG FLEXPEN) 100 UNIT/ML FlexPen Inject under skin 10 units as needed for high blood sugar correction 15 mL 1  . Insulin Glargine (LANTUS SOLOSTAR) 100 UNIT/ML Solostar Pen Inject 20 Units into the skin at bedtime. 5 pen 5  . Insulin Pen Needle 32G X 4 MM MISC Use once a day 100 each 11  . lithium 300 MG capsule Take by mouth. 1 every morning , 4 every evening    . metFORMIN (GLUCOPHAGE) 1000 MG tablet Take by mouth 2000 mg daily with dinner 180 tablet 1  . SAPHRIS 10 MG SUBL Take 15 mg by mouth at bedtime. Reported on 03/27/2016  0  . Sulfamethoxazole-Trimethoprim (SULFAMETHOXAZOLE-TMP DS PO) Take by mouth daily.     . CYCLOSET 0.8 MG TABS Take 1 tablet daily in AM. (Patient not taking: Reported on 06/27/2016) 30 tablet 11   No current facility-administered medications on file prior to visit.    Allergies  Allergen Reactions  . Divalproex Sodium Other (See Comments)    unknown  . Methylphenidate Hcl     Aggravate bipolar  . Oxycodone Hcl     REACTION: hallucinations  . Paroxetine     Aggravate bipolar disorder   Family History  Problem Relation Age of Onset  . Diabetes Mother   . Diabetes Father   . Colon cancer Neg Hx   . Stomach cancer Neg Hx   . Melanoma Maternal Uncle   . Pancreatitis Neg Hx   . Heart disease Neg Hx   . Kidney disease Neg Hx   . Liver disease Neg Hx    PE: BP 132/84 (BP Location: Left Arm, Patient  Position: Sitting)   Pulse 73   Ht 5' 10.5" (1.791 m)   Wt 217 lb (98.4 kg)   SpO2 95%   BMI 30.70 kg/m  Body mass index is 30.7 kg/m. Wt Readings from Last 3 Encounters:  06/27/16 217 lb (98.4 kg)  06/27/16 218 lb (98.9 kg)  05/26/16 215 lb (97.5 kg)   Constitutional: overweight, in NAD Eyes: PERRLA, EOMI, no exophthalmos ENT: moist mucous membranes, no thyromegaly, no cervical lymphadenopathy Cardiovascular: RRR, No MRG Respiratory: CTA B Gastrointestinal: abdomen soft, NT, ND, BS+  Musculoskeletal: no deformities, strength intact in all 4 Skin: moist, warm, no rashes Neurological: no tremor with outstretched hands, DTR normal in all 4  ASSESSMENT: 1. DM2, non-insulin-dependent, uncontrolled, without long term complications, but with hyperglycemia - tested neg for DM1: Component     Latest Ref Rng 05/28/2015  C-Peptide     0.80 - 3.90 ng/mL 2.88  Glucose, Fasting     65 - 99 mg/dL 161 (H)  Glutamic Acid Decarb Ab     <5 IU/mL <5  Pancreatic Islet Cell Antibody     < 5 JDF Units <5   PLAN:  1. Patient with long-standing, poorly controlled 2/2 dietary indiscretions. He was doing great on a vegan diet but he did not want to continue >> sugars have increased and we tried several meds: - We tried to add Invokana but he did not tolerated due to increased urination.  - He could not tolerate a sulfonylurea due to severe hypoglycemia >> agrees to retry though as sugars are high - We cannot use a DPP 4 inhibitor or a GLP-1 receptor agonist due to his history of pancreatitis.  - He had to stop Actos 2/2 abd. Pain - we stopped Cycloset >> expensive and not effective - It is very difficult to adjust his regimen, since he has a reverse circadian rhythm, he eats at different times of the day, he has different consistencies of her meals (from vegan to excess sugars and fat) and he has inconsistent exercise patterns (now not exercising at all). Therefore, his sugars are fluctuating, with  more hypoglycemia in the last 2 weeks since not exercising. We will increase her glipizide before dinner in the days that he is not exercising or if he has a larger meal. Again, strongly advised him to try to control his diet and to exercise consistently. - I advised him to: Patient Instructions  Please continue: - Lantus 20 units at bedtime - Metformin 1000 mg 2x a day, with meals   Increase: - Glipizide to 2.5 mg before b'fast and 5 mg before dinner if you are having a large meal or in the days that you are not exercising.  Please return in 1.5 months with your sugar log.   - continue checking sugars at different times of the day - check 1-2 times a day - advised for yearly eye exams >> UTD - Check HbA1c today and this is higher, at 7.9% - Return to clinic in 1.5 mo with sugar log  Carlus Pavlov, MD PhD [facility]

## 2016-06-27 NOTE — Progress Notes (Signed)
Subjective:    Patient ID: Cody Short, male    DOB: 05-10-1976, 40 y.o.   MRN: [id number]  HPI   40 year old male who  has a past medical history of Acne varioliformis (07/04/2010); Bipolar affective disorder (HCC); DM w/o Complication Type II (07/05/2007); HYPERTRIGLYCERIDEMIA (10/14/2010); Nocturia (07/04/2010); PILAR CYST (08/03/2007); and TOBACCO ABUSE, HX OF (07/31/2009). He presents with both of his parents today for the acute complaint of generalized abdominal pain. He reports that the pain started about 14 hours ago and radiates across his abdomen. He does report a history of pancreatitis but hs last flare was about 5 years ago. He is unsure of this is what his pain feels like this time. The pain is described as a " soreness" and " sharp stabbing pain". The pain has improving as the day goes on.   He reports that over the last 14 hours he has had episodes of pain with urination but that this has resolved. The pain is worse when sitting up from a supine position.   He denies any nausea, vomiting or diarrhea. His last BM was 2-3 days ago. Passing gas makes his pain better but he feels like " it is going to cause a lot of pain to have a bowel movement.   He denies any alcohol consumption    Review of Systems  Constitutional: Negative.   Respiratory: Negative.   Cardiovascular: Negative.   Gastrointestinal: Positive for abdominal pain. Negative for abdominal distention, anal bleeding, blood in stool, constipation, diarrhea, nausea and rectal pain.  Musculoskeletal: Negative.   Skin: Negative.   All other systems reviewed and are negative.  Past Medical History:  Diagnosis Date  . 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 Bipolar affective disorder (HCC)   . DM w/o Complication Type II 07/05/2007  . HYPERTRIGLYCERIDEMIA 10/14/2010  . Nocturia 07/04/2010  . PILAR CYST 08/03/2007   Cyst in groin-states comes up when blood sugar gets high. Recurs if cannot walk for a week.     . TOBACCO  ABUSE, HX OF 07/31/2009    Social History   Social History  . Marital status: Single    Spouse name: N/A  . Number of children: N/A  . Years of education: N/A   Occupational History  . Not on file.   Social History Main Topics  . Smoking status: Former Smoker    Packs/day: 2.50    Years: 10.00    Types: Cigarettes    Quit date: 10/27/2006  . Smokeless tobacco: Never Used  . Alcohol use No     Comment: none at all  . Drug use: No  . Sexual activity: Not on file   Other Topics Concern  . Not on file   Social History Narrative   Single. Not dating currently. No kids.    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 pilar cystectomy      Family History  Problem Relation Age of Onset  . Diabetes Mother   . Diabetes Father   . Colon cancer Neg Hx   . Stomach cancer Neg Hx   . Melanoma Maternal Uncle   . Pancreatitis Neg Hx   . Heart disease Neg Hx   . Kidney disease Neg Hx   . Liver disease Neg Hx  Allergies  Allergen Reactions  . Divalproex Sodium Other (See Comments)    unknown  . Methylphenidate Hcl     Aggravate bipolar  . Oxycodone Hcl     REACTION: hallucinations  . Paroxetine     Aggravate bipolar disorder    Current Outpatient Prescriptions on File Prior to Visit  Medication Sig Dispense Refill  . CYCLOSET 0.8 MG TABS Take 1 tablet daily in AM. (Patient not taking: Reported on 06/27/2016) 30 tablet 11  . fenofibrate (TRICOR) 145 MG tablet Take 1 tablet (145 mg total) by mouth daily. 90 tablet 3  . glipiZIDE (GLUCOTROL) 5 MG tablet Take 0.5 tablets (2.5 mg total) by mouth 2 (two) times daily before a meal. 60 tablet 3  . insulin aspart (NOVOLOG FLEXPEN) 100 UNIT/ML FlexPen Inject under skin 10 units as needed for high blood sugar correction 15 mL 1  . Insulin Glargine (LANTUS SOLOSTAR) 100 UNIT/ML  Solostar Pen Inject 20 Units into the skin at bedtime. 5 pen 5  . Insulin Pen Needle 32G X 4 MM MISC Use once a day 100 each 11  . lithium 300 MG capsule Take by mouth. 1 every morning , 4 every evening    . metFORMIN (GLUCOPHAGE) 1000 MG tablet Take by mouth 2000 mg daily with dinner 180 tablet 1  . propranolol (INDERAL) 40 MG tablet   0  . SAPHRIS 10 MG SUBL Take 15 mg by mouth at bedtime. Reported on 03/27/2016  0  . Sulfamethoxazole-Trimethoprim (SULFAMETHOXAZOLE-TMP DS PO) Take by mouth daily.      No current facility-administered medications on file prior to visit.     BP 138/90   Pulse 73   Temp 98.4 F (36.9 C) (Oral)   Ht 5\' 9"  (1.753 m)   Wt 218 lb (98.9 kg)   SpO2 94%   BMI 32.19 kg/m       Objective:   Physical Exam  Constitutional: He is oriented to person, place, and time. He appears well-developed and well-nourished. No distress.  Cardiovascular: Normal rate, regular rhythm, normal heart sounds and intact distal pulses.  Exam reveals no gallop and no friction rub.   No murmur heard. Pulmonary/Chest: Effort normal and breath sounds normal. No respiratory distress. He has no wheezes. He has no rales. He exhibits no tenderness.  Abdominal: Soft. Normal appearance and bowel sounds are normal. He exhibits no distension and no mass. There is no hepatosplenomegaly, splenomegaly or hepatomegaly. There is tenderness in the epigastric area and left upper quadrant. There is no rigidity, no rebound, no guarding, no CVA tenderness, no tenderness at McBurney's point and negative Murphy's sign. No hernia.  Genitourinary: Rectum normal, prostate normal and penis normal. Rectal exam shows no mass, no tenderness and guaiac negative stool. Prostate is not enlarged and not tender.  Genitourinary Comments: Non hard stool in vault.   Musculoskeletal: Normal range of motion. He exhibits no edema, tenderness or deformity.  Neurological: He is alert and oriented to person, place, and time. He  has normal reflexes.  Skin: Skin is warm and dry. No rash noted. He is not diaphoretic. No erythema. No pallor.  Psychiatric: He has a normal mood and affect. His behavior is normal. Thought content normal.  Nursing note and vitals reviewed.     Assessment & Plan:  1. Generalized abdominal pain - Doubt pancreatitis at this point. More likely mild constipation.  - Advised high fiber foods and to drink plenty of water - Will get labs and abd XR  -  POCT Urinalysis Dipstick (Automated) - CBC with Differential/Platelet - Lipase - DG Abd 2 Views; Future - Consider CT of abd if no improvement in abdominal pain   Shirline Freesory Sylar Voong, NP  - Hepatic function panel

## 2016-06-27 NOTE — Telephone Encounter (Signed)
Pt was seen by Shirline Freesory Nafziger

## 2016-06-27 NOTE — Telephone Encounter (Signed)
Pt is scheduled to see Dr. Durene CalHunter

## 2016-06-27 NOTE — Patient Instructions (Signed)
It was great meeting you today.   I am not convinced that this is pancreatitis, I think this may be related more to constipation.  I will follow up with you regarding labs and the x ray.   If your pain becomes worse over the weekend then please go to the ER  Increase your daily dose of fiber with green leafy vegetables and fruit.

## 2016-06-27 NOTE — Progress Notes (Signed)
Pre visit review using our clinic review tool, if applicable. No additional management support is needed unless otherwise documented below in the visit note. 

## 2016-06-27 NOTE — Telephone Encounter (Signed)
Patient Name: Cody KalesVINCENT Elahi  DOB: [birth year]    Initial Comment Caller having stomach pain since last night, hurts in the front and the back of the ABD, when he passes gas it releases some of the pain. HX of pancreatitis    Nurse Assessment  Nurse: Deatra JamesNoe, RN, Corrie DandyMary Date/Time Lamount Cohen(Eastern Time): 06/27/2016 12:32:59 PM  Confirm and document reason for call. If symptomatic, describe symptoms. You must click the next button to save text entered. ---Patient states he is having stomach pain since last night, hurts in the front and the back of the ABD, when he passes gas it releases some of the pain.  Has the patient traveled out of the country within the last 30 days? ---No  Does the patient have any new or worsening symptoms? ---Yes  Will a triage be completed? ---Yes  Related visit to physician within the last 2 weeks? ---No  Does the PT have any chronic conditions? (i.e. diabetes, asthma, etc.) ---Yes  List chronic conditions. ---"HX of pancreatitis , diabetes"  Is this a behavioral health or substance abuse call? ---No     Guidelines    Guideline Title Affirmed Question Affirmed Notes  Abdominal Pain - Male [1] MILD-MODERATE pain AND [2] constant AND [3] present > 2 hours    Final Disposition User   See Physician within 4 Hours (or PCP triage) Deatra JamesNoe, RN, [facility]    Referrals  REFERRED TO PCP OFFICE   Disagree/Comply: Comply

## 2016-07-01 ENCOUNTER — Other Ambulatory Visit: Payer: Self-pay

## 2016-07-01 ENCOUNTER — Telehealth: Payer: Self-pay | Admitting: Internal Medicine

## 2016-07-01 MED ORDER — GLIPIZIDE 5 MG PO TABS: ORAL_TABLET | ORAL | 1 refills | Status: DC

## 2016-07-01 NOTE — Telephone Encounter (Signed)
Pharmacy called and said that the PT is saying the Glipizide dosage has changed and they are requesting a new script for the updated dosage.

## 2016-07-02 ENCOUNTER — Encounter: Payer: Self-pay | Admitting: Adult Health

## 2016-07-13 ENCOUNTER — Encounter: Payer: Self-pay | Admitting: Adult Health

## 2016-07-15 ENCOUNTER — Encounter: Payer: Self-pay | Admitting: Internal Medicine

## 2016-08-02 ENCOUNTER — Encounter: Payer: Self-pay | Admitting: Internal Medicine

## 2016-08-07 ENCOUNTER — Encounter: Payer: Self-pay | Admitting: Internal Medicine

## 2016-08-11 ENCOUNTER — Ambulatory Visit (INDEPENDENT_AMBULATORY_CARE_PROVIDER_SITE_OTHER): Payer: BLUE CROSS/BLUE SHIELD | Admitting: Internal Medicine

## 2016-08-11 ENCOUNTER — Encounter: Payer: Self-pay | Admitting: Internal Medicine

## 2016-08-11 VITALS — BP 130/84 | HR 70 | Ht 70.0 in | Wt 219.0 lb

## 2016-08-11 DIAGNOSIS — Z23 Encounter for immunization: Secondary | ICD-10-CM

## 2016-08-11 DIAGNOSIS — E1165 Type 2 diabetes mellitus with hyperglycemia: Secondary | ICD-10-CM | POA: Diagnosis not present

## 2016-08-11 NOTE — Progress Notes (Signed)
Patient ID: Cody Short, male   DOB: 1976/10/27, 40 y.o.   MRN: [id number]  HPI: Cody Short is a 40 y.o.-year-old male, returning for follow-up for DM 2, dx in ~2010, non-insulin-dependent, uncontrolled, without complications. He is here with his mother who offers part of the history. Last visit 1.5 mo ago.  He walking on treadmill, 30-60 min - at night. Also lifting weights almost every day.  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  Last hemoglobin A1c was: Lab Results  Component Value Date   HGBA1C 7.9 06/27/2016   HGBA1C 7.4 03/27/2016   HGBA1C 7.7 (H) 12/17/2015   Pt is on a regimen of: - Metformin 1000 mg 2x a day, with meals    - Glipizide 2.5 mg in am >> nausea, vomiting, constipation He was on Lantus 20 >> came off 2 days ago  He tried Glipizide >> hypoglycemia in the 40s repeatedly. Lowest: 25. We tried Invokana >> bothersome urination >> had to stop. He had pancreatitis 2/2 Depakote and HTG in the past. He stopped Actos b/c stomach pain.  We stopped Cycloset >> inefficient, could not use b/c price.  Pt checks his sugars 1-2x a day: - am: 160-220 >> 136-154 >> 124 (exercise)-174, 195 >> n/c (wakes up late) >> 98, 100 >> 108-137 - 2h after b'fast: n/c - before lunch: 129-160, 190 >> n/c >> 163 >> 114-145 >> 166-214, 287 >> 130-162, 217 >> n/c - 2h after lunch: n/c >> 143-189 >> n/c >> 85-182 >> 197-218 >> 212 >> n/c - before dinner: n/c >> 81-113 >> 165 >> 210 >> 93 >> n/c >> 70 >> n/c - 2h after dinner: 85-184, 288 >> 126-153, 238 >> n/c >> 197-350 >> 60-187, 230, 263 >> 57, 71-136, 167 - bedtime: n/c >> 129-286 >> 120, 169 >> 89-138 >> 131, 145-305 >> 99-181, 253 >> 100-142, 174 - nighttime: n/c No lows. Lowest sugar was 60 >> 57; he has hypoglycemia awareness at 70.  Highest sugar was 263 >> 167.  Glucometer: FreeStyle  - no CKD, last BUN/creatinine:  Lab Results  Component Value Date    BUN 10 12/17/2015   CREATININE 1.10 12/17/2015   - last set of lipids: Lab Results  Component Value Date   CHOL 177 12/17/2015   HDL 39.00 (L) 12/17/2015   LDLCALC 67 02/02/2014   LDLDIRECT 99.0 12/17/2015   TRIG 299.0 (H) 12/17/2015   CHOLHDL 5 12/17/2015  On Fenofibrate.  - last eye exam was on 08/20/2015. No DR.  - + numbness; no tingling in his feet. He has recurrent furunculosis - groin.  In 2010, he was on Depakote >> pancreatitis.  ROS: Constitutional: No weight gain,no fatigue, no subjective hyperthermia/hypothermia, no nocturia Eyes: no blurry vision, no xerophthalmia ENT: no sore throat, no nodules palpated in throat, no dysphagia/odynophagia, no hoarseness Cardiovascular: no CP/SOB/palpitations/leg swelling Respiratory: no cough/SOB Gastrointestinal: no N/V/D/C Musculoskeletal: no muscle/joint aches Skin: no rashes Neurological: no tremors/numbness/tingling/dizziness  I reviewed pt's medications, allergies, PMH, social hx, family hx, and changes were documented in the history of present illness. Otherwise, unchanged from my initial visit note.  Past Medical History:  Diagnosis Date  . 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 Bipolar affective disorder (HCC)   . DM w/o Complication Type II 07/05/2007  . HYPERTRIGLYCERIDEMIA 10/14/2010  . Nocturia 07/04/2010  . PILAR CYST 08/03/2007   Cyst in groin-states comes up when blood sugar gets high.  Recurs if cannot walk for a week.     . TOBACCO ABUSE, HX OF 07/31/2009   Past Surgical History:  Procedure Laterality Date  . pilar cystectomy     History   Social History  . Marital Status: Single    Spouse Name: N/A  . Number of Children: 0   Occupational History  .    Social History Main Topics  . Smoking status: Former Smoker - quit in 2010  . Smokeless tobacco: Never Used  . Alcohol Use: No     Comment: none at all  . Drug Use: No   Current Outpatient Prescriptions on File Prior to Visit  Medication Sig Dispense  Refill  . CYCLOSET 0.8 MG TABS Take 1 tablet daily in AM. 30 tablet 11  . fenofibrate (TRICOR) 145 MG tablet Take 1 tablet (145 mg total) by mouth daily. 90 tablet 3  . glipiZIDE (GLUCOTROL) 5 MG tablet Take 2.5 mg before breakfast, and 5 mg before dinner if eating large meal or not exercising. 90 tablet 1  . insulin aspart (NOVOLOG FLEXPEN) 100 UNIT/ML FlexPen Inject under skin 10 units as needed for high blood sugar correction 15 mL 1  . Insulin Glargine (LANTUS SOLOSTAR) 100 UNIT/ML Solostar Pen Inject 20 Units into the skin at bedtime. 5 pen 5  . Insulin Pen Needle 32G X 4 MM MISC Use once a day 100 each 11  . lithium 300 MG capsule Take by mouth. 1 every morning , 4 every evening    . metFORMIN (GLUCOPHAGE) 1000 MG tablet Take by mouth 2000 mg daily with dinner 180 tablet 1  . propranolol (INDERAL) 40 MG tablet   0  . SAPHRIS 10 MG SUBL Take 15 mg by mouth at bedtime. Reported on 03/27/2016  0  . Sulfamethoxazole-Trimethoprim (SULFAMETHOXAZOLE-TMP DS PO) Take by mouth daily.      No current facility-administered medications on file prior to visit.    Allergies  Allergen Reactions  . Divalproex Sodium Other (See Comments)    unknown  . Methylphenidate Hcl     Aggravate bipolar  . Oxycodone Hcl     REACTION: hallucinations  . Paroxetine     Aggravate bipolar disorder   Family History  Problem Relation Age of Onset  . Diabetes Mother   . Diabetes Father   . Colon cancer Neg Hx   . Stomach cancer Neg Hx   . Melanoma Maternal Uncle   . Pancreatitis Neg Hx   . Heart disease Neg Hx   . Kidney disease Neg Hx   . Liver disease Neg Hx    PE: BP 130/84 (BP Location: Left Arm, Patient Position: Sitting)   Pulse 70   Ht 5\' 10"  (1.778 m)   Wt 219 lb (99.3 kg)   SpO2 96%   BMI 31.42 kg/m  Body mass index is 31.42 kg/m. Wt Readings from Last 3 Encounters:  08/11/16 219 lb (99.3 kg)  06/27/16 217 lb (98.4 kg)  06/27/16 218 lb (98.9 kg)   Constitutional: overweight, in NAD Eyes:  PERRLA, EOMI, no exophthalmos ENT: moist mucous membranes, no thyromegaly, no cervical lymphadenopathy Cardiovascular: RRR, No MRG Respiratory: CTA B Gastrointestinal: abdomen soft, NT, ND, BS+ Musculoskeletal: no deformities, strength intact in all 4 Skin: moist, warm, no rashes Neurological: no tremor with outstretched hands, DTR normal in all 4  ASSESSMENT: 1. DM2, non-insulin-dependent, uncontrolled, without long term complications, but with hyperglycemia - tested neg for DM1: Component     Latest Ref Rng 05/28/2015  C-Peptide     0.80 - 3.90 ng/mL 2.88  Glucose, Fasting     65 - 99 mg/dL 161120 (H)  Glutamic Acid Decarb Ab     <5 IU/mL <5  Pancreatic Islet Cell Antibody     < 5 JDF Units <5   PLAN:  1. Patient with long-standing, now with improved control 2/2 increased exercise. He was doing great on a vegan diet but he did not want to continue >> sugars have increased and we tried several meds: - We tried to add Invokana but he did not tolerated due to increased urination.  - He could not tolerate a sulfonylurea due to severe hypoglycemia >> agrees to retry though as sugars are high - We cannot use a DPP 4 inhibitor or a GLP-1 receptor agonist due to his history of pancreatitis.  - He had to stop Actos 2/2 abd. Pain - we stopped Cycloset >> expensive and not effective - Glipizide is causing him GI sxs: N/V/D. - It is very difficult to adjust his regimen, since he has a reverse circadian rhythm, he eats at different times of the day, he has different consistencies of her meals (from vegan to excess sugars and fat) and he has inconsistent exercise patterns. He is currently exercising >> sugars improved since last visit despite stopping Lantus >> will stop Glipizide, also.  - last HBA1c was higher - 7.9% - I advised him to: Patient Instructions  Please continue: - Metformin  1000 mg 2x a day  Please stay off insulin for now.  Stop Glipizide.  KEEP UP THE WORK!  Please return  in 3 months with your sugar log.    - continue checking sugars at different times of the day - check 1-2 times a day - advised for yearly eye exams >> needs one - given flu shot today - Return to clinic in 3 mo with sugar log  Carlus Pavlovristina Phaedra Colgate, MD PhD [facility]

## 2016-08-11 NOTE — Addendum Note (Signed)
Addended by: Darene LamerHOMPSON, Keeya Dyckman T on: 08/11/2016 03:21 PM   Modules accepted: Orders

## 2016-08-11 NOTE — Patient Instructions (Addendum)
Please continue: - Metformin  1000 mg 2x a day  Please stay off insulin for now.  Stop Glipizide.  KEEP UP THE WORK!  Please return in 3 months with your sugar log.

## 2016-09-14 ENCOUNTER — Encounter: Payer: Self-pay | Admitting: Internal Medicine

## 2016-09-16 ENCOUNTER — Encounter: Payer: Self-pay | Admitting: Internal Medicine

## 2016-09-23 ENCOUNTER — Encounter: Payer: Self-pay | Admitting: Internal Medicine

## 2016-09-26 ENCOUNTER — Encounter: Payer: Self-pay | Admitting: Internal Medicine

## 2016-10-13 ENCOUNTER — Encounter: Payer: Self-pay | Admitting: Family Medicine

## 2016-10-13 ENCOUNTER — Ambulatory Visit (INDEPENDENT_AMBULATORY_CARE_PROVIDER_SITE_OTHER): Payer: BLUE CROSS/BLUE SHIELD | Admitting: Family Medicine

## 2016-10-13 VITALS — BP 112/70 | HR 68 | Temp 98.9°F | Ht 70.0 in | Wt 215.8 lb

## 2016-10-13 DIAGNOSIS — R1032 Left lower quadrant pain: Secondary | ICD-10-CM

## 2016-10-13 DIAGNOSIS — R1031 Right lower quadrant pain: Secondary | ICD-10-CM

## 2016-10-13 NOTE — Patient Instructions (Signed)
Let's trial capful of miralax daily for 3 days then go to every other day for a week then every 3rd day for a week. May hold for diarrhea. If not feeling better in about 3 weeks- contact me and we can order CT scan.

## 2016-10-13 NOTE — Progress Notes (Signed)
Pre visit review using our clinic review tool, if applicable. No additional management support is needed unless otherwise documented below in the visit note. 

## 2016-10-13 NOTE — Progress Notes (Signed)
Subjective:  Cody Short is a 40 y.o. year old very pleasant male patient who presents for/with See problem oriented charting ROS- no fever, chills, dysuria. Polyuria due to diabetes but improving. No nausea or vomiting. No blood in stool.   Past Medical History-  Patient Active Problem List   Diagnosis Date Noted  . Bipolar depression (HCC) 03/23/2013    Priority: High  . Asthma 04/25/2013    Priority: Medium  . HYPERTRIGLYCERIDEMIA 10/14/2010    Priority: Medium  . 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 Former smoker 06/27/2015    Priority: Low  . Rectal bleeding 08/22/2014    Priority: Low  . Hx of pancreatitis 02/07/2014    Priority: Low  . Type 2 diabetes mellitus with hyperglycemia, without long-term current use of insulin (HCC) 02/11/2016    Medications- reviewed and updated Current Outpatient Prescriptions  Medication Sig Dispense Refill  . fenofibrate (TRICOR) 145 MG tablet Take 1 tablet (145 mg total) by mouth daily. 90 tablet 3  . insulin aspart (NOVOLOG FLEXPEN) 100 UNIT/ML FlexPen Inject under skin 10 units as needed for high blood sugar correction 15 mL 1  . Insulin Glargine (LANTUS SOLOSTAR) 100 UNIT/ML Solostar Pen Inject 20 Units into the skin at bedtime. 5 pen 5  . Insulin Pen Needle 32G X 4 MM MISC Use once a day 100 each 11  . lithium 300 MG capsule Take by mouth. 1 every morning , 4 every evening    . metFORMIN (GLUCOPHAGE) 1000 MG tablet Take by mouth 2000 mg daily with dinner 180 tablet 1  . propranolol (INDERAL) 40 MG tablet   0  . SAPHRIS 10 MG SUBL Take 15 mg by mouth at bedtime. Reported on 03/27/2016  0   No current facility-administered medications for this visit.     Objective: BP 112/70 (BP Location: Left Arm, Patient Position: Sitting, Cuff Size: Large)   Pulse 68   Temp 98.9 F (37.2 C) (Oral)   Ht 5\' 10"  (1.778 m)   Wt 215 lb 12.8 oz (97.9 kg)   SpO2 96%   BMI 30.96 kg/m  Gen: NAD, resting comfortably CV: RRR  no murmurs rubs or gallops Lungs: CTAB no crackles, wheeze, rhonchi Abdomen: soft/very mildly tender RLQ and LLQ pain/nondistended/normal bowel sounds. No rebound or guarding.  Ext: no edema  Assessment/Plan:  RLQ and LLQ abdominal pain S: saw [facility] 3 months ago for diffuse abdominal pain (patient thought it was 6-8 months ago because feels like he has dealth with this for so long). Thought perhaps constipation. Did labs (cbc, lipase, poc UA, abdomen films). WBC 11.3 with 8.3 K neutrophils at that time. Moderate stool in colon on x-ray  He thought it might get better if he removed some medicines, particularly sulfa drugs made things worse. States glipizide made things "horrible". Stopped medicine and over 9 weeks has started to improve. Hurts in LLQ and RLQ. Better with bowel movements then worsens again. 2 BM a day and not firm pretty average for him (was like this when had moderate stool burden on x-ray). I independently reviewed film with patient ot show him the moderate stool burden.   States pain down from 8/10 when on glipizide and now down to about 3/10.   A/P: Unclear etiology but suspect constipation related (improves with BM short term). We opted to trial miralax (per avs) and if not improved 3-4 weeks will get CT abd/pelvis (would be 4 months of pain at that point). He  did try miralax before and helped some but never completely resolved issue but was on glipizide at that time- he is going to remain off the glipizide given worsening pain on this. Doubt pancreatitis withprior lipase being normal and no pain Epigastric or ruq/luq.   The duration of face-to-face time during this visit was greater than 15 minutes. Greater than 50% of this time was spent in counseling on dealing with pain for this long, discussion constipation as potential cause, discussing follow up and red flags.   Return precautions advised.  Tana ConchStephen Dariane Natzke, MD

## 2016-10-14 ENCOUNTER — Telehealth: Payer: Self-pay | Admitting: Emergency Medicine

## 2016-10-14 ENCOUNTER — Other Ambulatory Visit: Payer: Self-pay | Admitting: Emergency Medicine

## 2016-10-14 MED ORDER — FENOFIBRATE 145 MG PO TABS
145.0000 mg | ORAL_TABLET | Freq: Every day | ORAL | 1 refills | Status: DC
Start: 2016-10-14 — End: 2016-10-22

## 2016-10-14 NOTE — Telephone Encounter (Signed)
Is it okay to refill Fenofibrate tab 145 mg or does patient need an appointment?   [facility]

## 2016-10-14 NOTE — Telephone Encounter (Signed)
Rx sent to pharmacy   

## 2016-10-14 NOTE — Addendum Note (Signed)
Addended by: Jimmye NormanPHANOS, Alainna Stawicki J on: 10/14/2016 05:07 PM   Modules accepted: Orders

## 2016-10-22 ENCOUNTER — Encounter: Payer: Self-pay | Admitting: Family Medicine

## 2016-10-22 ENCOUNTER — Other Ambulatory Visit: Payer: Self-pay

## 2016-10-22 MED ORDER — FENOFIBRATE 145 MG PO TABS: 145.0000 mg | ORAL_TABLET | Freq: Every day | ORAL | 1 refills | Status: DC

## 2016-11-11 ENCOUNTER — Encounter: Payer: Self-pay | Admitting: Internal Medicine

## 2016-11-11 ENCOUNTER — Ambulatory Visit (INDEPENDENT_AMBULATORY_CARE_PROVIDER_SITE_OTHER): Payer: BLUE CROSS/BLUE SHIELD | Admitting: Internal Medicine

## 2016-11-11 VITALS — BP 130/82 | HR 64 | Ht 70.0 in | Wt 214.0 lb

## 2016-11-11 DIAGNOSIS — E1165 Type 2 diabetes mellitus with hyperglycemia: Secondary | ICD-10-CM

## 2016-11-11 LAB — POCT GLYCOSYLATED HEMOGLOBIN (HGB A1C): HEMOGLOBIN A1C: 8.7

## 2016-11-11 MED ORDER — INSULIN ASPART 100 UNIT/ML FLEXPEN: PEN_INJECTOR | SUBCUTANEOUS | 1 refills | Status: DC

## 2016-11-11 MED ORDER — METFORMIN HCL 1000 MG PO TABS: ORAL_TABLET | ORAL | 1 refills | Status: DC

## 2016-11-11 NOTE — Progress Notes (Signed)
Patient ID: Cody Short, male   DOB: December 04, 1975, 41 y.o.   MRN: [id number]  HPI: Cody Short is a 41 y.o.-year-old male, returning for follow-up for DM 2, dx in ~2010, non-insulin-dependent, uncontrolled, without complications. He is here with his mother who offers part of the history. Last visit 3 mo ago.  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  He walking on treadmill 30 min/day (2.5-3.5 mph), lifts weights - 30 min/day. He is using a rowing machine 10-15 min/night.   He now eats vegan meals most meals - started after the Holidays. Sugars improved, but they were very high during the Holidays.  Last hemoglobin A1c was: Lab Results  Component Value Date   HGBA1C 7.9 06/27/2016   HGBA1C 7.4 03/27/2016   HGBA1C 7.7 (H) 12/17/2015   Pt is on a regimen of: - Metformin 1000 mg 2x a day, with meals  - Novolog - was taking 10 units 4x a day >> now 10 units with 1 (-2) meals. He was on Lantus 20 >> came off. He tried Glipizide >> hypoglycemia in the 40s repeatedly. Lowest: 25. Also, Glipizide 2.5 mg in am >> nausea, vomiting, constipation We tried Invokana >> bothersome urination >> had to stop. He had pancreatitis 2/2 Depakote and HTG in the past. He stopped Actos b/c stomach pain.  We stopped Cycloset >> inefficient, could not use b/c price.  Pt checks his sugars 3-4x a day: - am: 124 (exercise)-174, 195 >> n/c (wakes up late) >> 98, 100 >> 108-137 >> 133 - 2h after b'fast: n/c - before lunch: 163 >> 114-145 >> 166-214, 287 >> 130-162, 217 >> n/c >> 141-179, 219 - 2h after lunch: n/c >> 143-189 >> n/c >> 85-182 >> 197-218 >> 212 >> n/c >> 215, 247 - before dinner: n/c >> 81-113 >> 165 >> 210 >> 93 >> n/c >> 70 >> n/c >> 181 - 2h after dinner: 197-350 >> 60-187, 230, 263 >> 57, 71-136, 167 >> 132-285, 326 - bedtime: 89-138 >> 131, 145-305 >> 99-181, 253 >> 100-142, 174 >> 87-165 - nighttime: n/c No lows.  Lowest sugar was 60 >> 57 >> 87; he has hypoglycemia awareness at 70.  Highest sugar was 263 >> 167 >> 326.  Glucometer: FreeStyle  - no CKD, last BUN/creatinine:  Lab Results  Component Value Date   BUN 10 12/17/2015   CREATININE 1.10 12/17/2015   - last set of lipids: Lab Results  Component Value Date   CHOL 177 12/17/2015   HDL 39.00 (L) 12/17/2015   LDLCALC 67 02/02/2014   LDLDIRECT 99.0 12/17/2015   TRIG 299.0 (H) 12/17/2015   CHOLHDL 5 12/17/2015  On Fenofibrate.  - last eye exam was on 08/20/2015. No DR.  - + numbness; no tingling in his feet. He has recurrent furunculosis - groin.  In 2010, he was on Depakote >> pancreatitis.  ROS: Constitutional: No weight gain,no fatigue, no subjective hyperthermia/hypothermia, no nocturia Eyes: no blurry vision, no xerophthalmia ENT: no sore throat, no nodules palpated in throat, no dysphagia/odynophagia, no hoarseness Cardiovascular: no CP/SOB/palpitations/leg swelling Respiratory: no cough/SOB Gastrointestinal: no N/V/D/C Musculoskeletal: no muscle/joint aches Skin: no rashes Neurological: no tremors/numbness/tingling/dizziness  I reviewed pt's medications, allergies, PMH, social hx, family hx, and changes were documented in the history of present illness. Otherwise, unchanged from my initial visit note.  Past Medical History:  Diagnosis Date  . 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 Bipolar  affective disorder (HCC)   . DM w/o Complication Type II 07/05/2007  . HYPERTRIGLYCERIDEMIA 10/14/2010  . Nocturia 07/04/2010  . PILAR CYST 08/03/2007   Cyst in groin-states comes up when blood sugar gets high. Recurs if cannot walk for a week.     . TOBACCO ABUSE, HX OF 07/31/2009   Past Surgical History:  Procedure Laterality Date  . pilar cystectomy     History   Social History  . Marital Status: Single    Spouse Name: N/A  . Number of Children: 0   Occupational History  .    Social History Main Topics  . Smoking status: Former  Smoker - quit in 2010  . Smokeless tobacco: Never Used  . Alcohol Use: No     Comment: none at all  . Drug Use: No   Current Outpatient Prescriptions on File Prior to Visit  Medication Sig Dispense Refill  . fenofibrate (TRICOR) 145 MG tablet Take 1 tablet (145 mg total) by mouth daily. 90 tablet 1  . Insulin Pen Needle 32G X 4 MM MISC Use once a day 100 each 11  . lithium 300 MG capsule Take by mouth. 1 every morning , 4 every evening    . propranolol (INDERAL) 40 MG tablet   0  . SAPHRIS 10 MG SUBL Take 15 mg by mouth at bedtime. Reported on 03/27/2016  0  + Novolog - see HPI  No current facility-administered medications on file prior to visit.    Allergies  Allergen Reactions  . Divalproex Sodium Other (See Comments)    unknown  . Methylphenidate Hcl     Aggravate bipolar  . Oxycodone Hcl     REACTION: hallucinations  . Paroxetine     Aggravate bipolar disorder   Family History  Problem Relation Age of Onset  . Diabetes Mother   . Diabetes Father   . Colon cancer Neg Hx   . Stomach cancer Neg Hx   . Melanoma Maternal Uncle   . Pancreatitis Neg Hx   . Heart disease Neg Hx   . Kidney disease Neg Hx   . Liver disease Neg Hx    PE: BP 130/82 (BP Location: Left Arm, Patient Position: Sitting)   Pulse 64   Ht 5\' 10"  (1.778 m)   Wt 214 lb (97.1 kg)   SpO2 98%   BMI 30.71 kg/m  Body mass index is 30.71 kg/m. Wt Readings from Last 3 Encounters:  11/11/16 214 lb (97.1 kg)  10/13/16 215 lb 12.8 oz (97.9 kg)  08/11/16 219 lb (99.3 kg)   Constitutional: overweight, in NAD Eyes: PERRLA, EOMI, no exophthalmos ENT: moist mucous membranes, no thyromegaly, no cervical lymphadenopathy Cardiovascular: RRR, No MRG Respiratory: CTA B Gastrointestinal: abdomen soft, NT, ND, BS+ Musculoskeletal: no deformities, strength intact in all 4 Skin: moist, warm, no rashes Neurological: no tremor with outstretched hands, DTR normal in all 4  ASSESSMENT: 1. DM2, non-insulin-dependent,  uncontrolled, without long term complications, but with hyperglycemia - tested neg for DM1: Component     Latest Ref Rng 05/28/2015  C-Peptide     0.80 - 3.90 ng/mL 2.88  Glucose, Fasting     65 - 99 mg/dL 161 (H)  Glutamic Acid Decarb Ab     <5 IU/mL <5  Pancreatic Islet Cell Antibody     < 5 JDF Units <5   PLAN:  1. Patient with long-standing, With worse control of blood sugars during the holidays, now improved after starting a more vegan  diet, and exercising more (but still has large portions, many snacks and candy at night, eats out), but with still hyperglycemia in am and after meals.  - His DM is difficult to manage b/c multiple intolerances: - We tried to add Invokana but he did not tolerated due to increased urination.  - We cannot use a DPP 4 inhibitor or a GLP-1 receptor agonist due to his history of pancreatitis.  - He had to stop Actos >>  abd. Pain - we stopped Cycloset >> expensive and not effective - we tried Glipizide >> GI sxs: N/V/D.  - He is now on Metformin and 1x a day Novolog. He takes this after a meal. I advised him to move this 2x a day before meals, 7-10 units ~ size of the meals - last HBA1c was higher - 7.9% >> today: 8.7% (higher) - I advised him to: Patient Instructions  Please continue: - Metformin 1000 mg 2x a day  Please change the dose of the Novolog and take it before b'fast and dinner: - 7 units before a smaller meal - 10 units before a larger meal  Please return in 3 months with your sugar log.   - continue checking sugars at different times of the day - check 1-2 times a day - advised for yearly eye exams >> needs one - given flu shot at last visit - Return to clinic in 3 mo with sugar log  Carlus Pavlovristina Esau Fridman, MD PhD [facility]

## 2016-11-11 NOTE — Patient Instructions (Signed)
Please continue: - Metformin 1000 mg 2x a day  Please change the dose of the Novolog and take it before b'fast and dinner: - 7 units before a smaller meal - 10 units before a larger meal  Please return in 3 months with your sugar log.

## 2016-11-11 NOTE — Addendum Note (Signed)
Addended by: Darene LamerHOMPSON, Raylyn Speckman T on: 11/11/2016 04:41 PM   Modules accepted: Orders

## 2016-11-13 ENCOUNTER — Encounter: Payer: Self-pay | Admitting: Family Medicine

## 2016-11-14 ENCOUNTER — Encounter: Payer: Self-pay | Admitting: Internal Medicine

## 2016-11-21 ENCOUNTER — Encounter: Payer: Self-pay | Admitting: Family Medicine

## 2016-11-26 ENCOUNTER — Encounter: Payer: Self-pay | Admitting: Internal Medicine

## 2016-11-27 ENCOUNTER — Encounter: Payer: Self-pay | Admitting: Internal Medicine

## 2017-01-15 ENCOUNTER — Encounter: Payer: Self-pay | Admitting: Internal Medicine

## 2017-01-15 ENCOUNTER — Other Ambulatory Visit: Payer: Self-pay | Admitting: Internal Medicine

## 2017-01-15 ENCOUNTER — Encounter: Payer: Self-pay | Admitting: Family Medicine

## 2017-01-15 MED ORDER — METFORMIN HCL ER 500 MG PO TB24: 2000.0000 mg | ORAL_TABLET | Freq: Every day | ORAL | 3 refills | Status: DC

## 2017-02-10 ENCOUNTER — Ambulatory Visit (INDEPENDENT_AMBULATORY_CARE_PROVIDER_SITE_OTHER): Payer: BLUE CROSS/BLUE SHIELD | Admitting: Internal Medicine

## 2017-02-10 VITALS — BP 130/82 | HR 74 | Temp 97.7°F | Wt 213.8 lb

## 2017-02-10 DIAGNOSIS — E1165 Type 2 diabetes mellitus with hyperglycemia: Secondary | ICD-10-CM | POA: Diagnosis not present

## 2017-02-10 LAB — COMPLETE METABOLIC PANEL WITH GFR
ALBUMIN: 4.4 g/dL (ref 3.6–5.1)
ALK PHOS: 54 U/L (ref 40–115)
ALT: 35 U/L (ref 9–46)
AST: 37 U/L (ref 10–40)
BUN: 14 mg/dL (ref 7–25)
CO2: 19 mmol/L — ABNORMAL LOW (ref 20–31)
Calcium: 9.9 mg/dL (ref 8.6–10.3)
Chloride: 102 mmol/L (ref 98–110)
Creat: 1.16 mg/dL (ref 0.60–1.35)
GFR, EST NON AFRICAN AMERICAN: 79 mL/min (ref >=60)
GFR, Est African American: >89 mL/min (ref >=60)
GLUCOSE: 286 mg/dL — AB (ref 65–99)
POTASSIUM: 4.1 mmol/L (ref 3.5–5.3)
SODIUM: 134 mmol/L — AB (ref 135–146)
Total Bilirubin: 0.4 mg/dL (ref 0.2–1.2)
Total Protein: 7.5 g/dL (ref 6.1–8.1)

## 2017-02-10 LAB — POCT GLYCOSYLATED HEMOGLOBIN (HGB A1C): Hemoglobin A1C: 8.9

## 2017-02-10 LAB — LDL CHOLESTEROL, DIRECT: LDL DIRECT: 113 mg/dL

## 2017-02-10 LAB — LIPID PANEL
CHOLESTEROL: 217 mg/dL — AB (ref 0–200)
HDL: 34 mg/dL — ABNORMAL LOW (ref >39.00)
Total CHOL/HDL Ratio: 6

## 2017-02-10 LAB — MICROALBUMIN / CREATININE URINE RATIO
Creatinine,U: 70.8 mg/dL
MICROALB/CREAT RATIO: 3.7 mg/g (ref 0.0–30.0)
Microalb, Ur: 2.6 mg/dL — ABNORMAL HIGH (ref 0.0–1.9)

## 2017-02-10 MED ORDER — GLUCOSE BLOOD VI STRP: ORAL_STRIP | 3 refills | Status: DC

## 2017-02-10 MED ORDER — INSULIN ASPART 100 UNIT/ML FLEXPEN: PEN_INJECTOR | SUBCUTANEOUS | 3 refills | Status: DC

## 2017-02-10 MED ORDER — INSULIN GLARGINE 100 UNIT/ML SOLOSTAR PEN: 20.0000 [IU] | PEN_INJECTOR | Freq: Every day | SUBCUTANEOUS | 3 refills | Status: DC

## 2017-02-10 NOTE — Progress Notes (Signed)
Patient ID: Cody Short, male   DOB: [birth year], 41 y.o.   MRN: [id number]  HPI: Cody Short is a 41 y.o.-year-old male, returning for follow-up for DM 2, dx in ~2010, insulin-dependent, uncontrolled, without long term complications. He is here with his mother who offers part of the history. Last visit 3 mo ago.  Since last visit, sugars are higher. He had to decrease metformin to just once a day due to constipation. He feels that the constipation is actually caused by one of his psychotropic medications, Saphris. He is taking MiraLAX every other day. This helps, but it did help more when he took it every day.  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 He continues to walkewds on treadmill 30 min/day (2.5-3.5 mph), lifts weights - 30 min/day. He is using a rowing machine 10-15 min/night.   Last hemoglobin A1c was: Lab Results  Component Value Date   HGBA1C 8.7 11/11/2016   HGBA1C 7.9 06/27/2016   HGBA1C 7.4 03/27/2016   Pt is on a regimen of: - Metformin 1000 mg 1x a day at dinnertime >> Miralax qod - Novolog and take it before b'fast and dinner - 15 units before the 2 meals of the day. If sugars are still high 2 hours after the meal, he takes another 15 unit dose ! He was on Lantus 20 >> Now off. He tried Glipizide >> hypoglycemia in the 40s repeatedly. Lowest: 25. Also, Glipizide 2.5 mg in am >> nausea, vomiting, constipation We tried Invokana >> bothersome urination >> had to stop. He had pancreatitis 2/2 Depakote and HTG in the past. He stopped Actos b/c stomach pain.  We stopped Cycloset >> inefficient, could not use b/c price.  Pt checks his sugars 3-4x a day: - am: 124 (exercise)-174, 195 >> n/c (wakes up late) >> 98, 100 >> 108-137 >> 133 >> 140-259 - 2h after b'fast: n/c - before lunch: 163 >> 114-145 >> 166-214, 287 >> 130-162, 217 >> n/c >> 141-179, 219 >> 265 - 2h after lunch: n/c >> 143-189 >>  n/c >> 85-182 >> 197-218 >> 212 >> n/c >> 215, 247 >> n/c - before dinner: n/c >> 81-113 >> 165 >> 210 >> 93 >> n/c >> 70 >> n/c >> 181 >> 156-327 - 2h after dinner: 197-350 >> 60-187, 230, 263 >> 57, 71-136, 167 >> 132-285, 326 >> 145-367 - bedtime: 89-138 >> 131, 145-305 >> 99-181, 253 >> 100-142, 174 >> 87-165 >> 220 - nighttime: n/c No lows. Lowest sugar was 60 >> 57 >> 87 >> 140; he has hypoglycemia awareness at 70.  Highest sugar was 263 >> 167 >> 326 >> 355.  Glucometer: FreeStyle  - no CKD, last BUN/creatinine:  Lab Results  Component Value Date   BUN 10 12/17/2015   CREATININE 1.10 12/17/2015   - last set of lipids: Lab Results  Component Value Date   CHOL 177 12/17/2015   HDL 39.00 (L) 12/17/2015   LDLCALC 67 02/02/2014   LDLDIRECT 99.0 12/17/2015   TRIG 299.0 (H) 12/17/2015   CHOLHDL 5 12/17/2015  On Fenofibrate.  - last eye exam was on 08/20/2015. No DR.  - + numbness; no tingling in his feet. He has recurrent furunculosis - groin.  In 2010, he was on Depakote >> pancreatitis.  ROS: Constitutional: No weight gain,no fatigue, no subjective hyperthermia/hypothermia, no nocturia Eyes: no blurry vision, no xerophthalmia ENT: no sore throat, no nodules  palpated in throat, no dysphagia/odynophagia, no hoarseness Cardiovascular: no CP/SOB/palpitations/leg swelling Respiratory: no cough/SOB Gastrointestinal: no N/V/D/+ C Musculoskeletal: no muscle/joint aches Skin: no rashes Neurological: no tremors/numbness/tingling/dizziness  I reviewed pt's medications, allergies, PMH, social hx, family hx, and changes were documented in the history of present illness. Otherwise, unchanged from my initial visit note.  Past Medical History:  Diagnosis Date  . 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 Bipolar affective disorder (HCC)   . DM w/o Complication Type II 07/05/2007  . HYPERTRIGLYCERIDEMIA 10/14/2010  . Nocturia 07/04/2010  . PILAR CYST 08/03/2007   Cyst in groin-states comes up  when blood sugar gets high. Recurs if cannot walk for a week.     . TOBACCO ABUSE, HX OF 07/31/2009   Past Surgical History:  Procedure Laterality Date  . pilar cystectomy     History   Social History  . Marital Status: Single    Spouse Name: N/A  . Number of Children: 0   Occupational History  .    Social History Main Topics  . Smoking status: Former Smoker - quit in 2010  . Smokeless tobacco: Never Used  . Alcohol Use: No     Comment: none at all  . Drug Use: No   Current Outpatient Prescriptions on File Prior to Visit  Medication Sig Dispense Refill  . fenofibrate (TRICOR) 145 MG tablet Take 1 tablet (145 mg total) by mouth daily. 90 tablet 1  . Insulin Pen Needle 32G X 4 MM MISC Use once a day 100 each 11  . lithium 300 MG capsule Take by mouth. 1 every morning , 4 every evening    . propranolol (INDERAL) 40 MG tablet   0  . SAPHRIS 10 MG SUBL Take 15 mg by mouth at bedtime. Reported on 03/27/2016  0  + Novolog - see HPI  No current facility-administered medications on file prior to visit.    Allergies  Allergen Reactions  . Divalproex Sodium Other (See Comments)    unknown  . Methylphenidate Hcl     Aggravate bipolar  . Oxycodone Hcl     REACTION: hallucinations  . Paroxetine     Aggravate bipolar disorder   Family History  Problem Relation Age of Onset  . Diabetes Mother   . Diabetes Father   . Colon cancer Neg Hx   . Stomach cancer Neg Hx   . Melanoma Maternal Uncle   . Pancreatitis Neg Hx   . Heart disease Neg Hx   . Kidney disease Neg Hx   . Liver disease Neg Hx    PE: BP 130/82 (BP Location: Right Arm, Patient Position: Sitting, Cuff Size: Large)   Pulse 74   Temp 97.7 F (36.5 C) (Oral)   Wt 213 lb 12.8 oz (97 kg)   BMI 30.68 kg/m  Body mass index is 30.68 kg/m. Wt Readings from Last 3 Encounters:  02/10/17 213 lb 12.8 oz (97 kg)  11/11/16 214 lb (97.1 kg)  10/13/16 215 lb 12.8 oz (97.9 kg)   Constitutional: overweight, in NAD Eyes:  PERRLA, EOMI, no exophthalmos ENT: moist mucous membranes, no thyromegaly, no cervical lymphadenopathy Cardiovascular: RRR, No MRG Respiratory: CTA B Gastrointestinal: abdomen soft, NT, ND, BS+ Musculoskeletal: no deformities, strength intact in all 4 Skin: moist, warm, no rashes Neurological: no tremor with outstretched hands, DTR normal in all 4  ASSESSMENT: 1. DM2, non-insulin-dependent, uncontrolled, without long term complications, but with hyperglycemia - tested neg for DM1: Component     Latest Ref Rng  05/28/2015  C-Peptide     0.80 - 3.90 ng/mL 2.88  Glucose, Fasting     65 - 99 mg/dL 161 (H)  Glutamic Acid Decarb Ab     <5 IU/mL <5  Pancreatic Islet Cell Antibody     < 5 JDF Units <5   PLAN:  1. Patient with long-standing, uncontrolled diabetes, with worse sugars at this visit. He had to decrease his metformin to once a day due to constipation. The suspicion is that this is caused by one of his psychotropic medicines, and not metformin (Saphris), so we discussed about increasing MiraLAX to every day and try to go higher on the metformin dose, twice a day. In the meantime, we will need to add long-acting insulin and I also increased his NovoLog. We did discuss about starting a sliding scale, however, will hold off for now but we may need to add it at next visit. - His DM is difficult to manage b/c multiple intolerances: - We tried to add Invokana but he did not tolerated due to increased urination.  - We cannot use a DPP 4 inhibitor or a GLP-1 receptor agonist due to his history of pancreatitis.  - He had to stop Actos >>  abd. Pain - we stopped Cycloset >> expensive and not effective - we tried Glipizide >> GI sxs: N/V/D.  - HbA1c today: 8.9% (higher) - I advised him to: Patient Instructions  Please try to increase: - Metformin 1000 mg to 2x a day  Please start: - Lantus 20 units at bedtime >> may increase as needed to ~36 (by 3 units every 3 days)  Continue: - Novolog  15 units before a smaller meal - Novolog 20 units before a larger meal  Let me know about the sugars in 2-3 weeks.  Please stop at the lab.  Please return in 3 months with your sugar log.   - continue checking sugars at different times of the day - check 1-2 times a day - advised for yearly eye exams >> needs one >> again advised him to schedule. - given flu shot this season - Today we will check a CMP, ACR, lipid panel. He is not fasting. - Return to clinic in 3 mo with sugar log  Office Visit on 02/10/2017  Component Date Value Ref Range Status  . Hemoglobin A1C 02/10/2017 8.9   Final  . Cholesterol 02/10/2017 217* 0 - 200 mg/dL Final  . Triglycerides 02/10/2017 711.0 Triglyceride is over 400; calculations on Lipids are invalid.* 0.0 - 149.0 mg/dL Final  . HDL 09/60/4540 34.00* >39.00 mg/dL Final  . Total CHOL/HDL Ratio 02/10/2017 6   Final  . Sodium 02/10/2017 134* 135 - 146 mmol/L Final  . Potassium 02/10/2017 4.1  3.5 - 5.3 mmol/L Final  . Chloride 02/10/2017 102  98 - 110 mmol/L Final  . CO2 02/10/2017 19* 20 - 31 mmol/L Final  . Glucose, Bld 02/10/2017 286* 65 - 99 mg/dL Final  . BUN 98/08/9146 14  7 - 25 mg/dL Final  . Creat 82/95/6213 1.16  0.60 - 1.35 mg/dL Final  . Total Bilirubin 02/10/2017 0.4  0.2 - 1.2 mg/dL Final  . Alkaline Phosphatase 02/10/2017 54  40 - 115 U/L Final  . AST 02/10/2017 37  10 - 40 U/L Final  . ALT 02/10/2017 35  9 - 46 U/L Final  . Total Protein 02/10/2017 7.5  6.1 - 8.1 g/dL Final  . Albumin 08/65/7846 4.4  3.6 - 5.1 g/dL Final  .  Calcium 02/10/2017 9.9  8.6 - 10.3 mg/dL Final  . GFR, Est African American 02/10/2017 >89  >=60 mL/min Final  . GFR, Est Non African American 02/10/2017 79  >=60 mL/min Final  . Microalb, Ur 02/10/2017 2.6* 0.0 - 1.9 mg/dL Final  . Creatinine,U 40/98/1191 70.8  mg/dL Final  . Microalb Creat Ratio 02/10/2017 3.7  0.0 - 30.0 mg/g Final  . Direct LDL 02/10/2017 113.0  mg/dL Final   Message sent: Dear [name], Your  labs have worsened significantly from before. Not only that your glucose is high, but now your triglycerides are also very high, much higher than before. This can be a risk for pancreatitis. I absolutely recommend that you change your diet right away to eliminate fatty foods and concentrated sweets. We will recheck the levels when you come back, and we'll need to start medication for this if they are not better. Sincerely, Carlus Pavlov MD  Carlus Pavlov, MD PhD [facility]

## 2017-02-10 NOTE — Patient Instructions (Addendum)
Please try to increase: - Metformin 1000 mg to 2x a day  Please start: - Lantus 20 units at bedtime >> may increase as needed to ~36 (by 3 units every 3 days)  Continue: - Novolog 15 units before a smaller meal - Novolog 20 units before a larger meal  Let me know about the sugars in 2-3 weeks.  Please stop at the lab.  Please return in 3 months with your sugar log.

## 2017-02-11 ENCOUNTER — Encounter: Payer: Self-pay | Admitting: Internal Medicine

## 2017-02-24 ENCOUNTER — Encounter: Payer: Self-pay | Admitting: Internal Medicine

## 2017-02-24 ENCOUNTER — Other Ambulatory Visit: Payer: Self-pay | Admitting: Internal Medicine

## 2017-02-24 ENCOUNTER — Other Ambulatory Visit: Payer: Self-pay

## 2017-02-24 MED ORDER — INSULIN PEN NEEDLE 32G X 4 MM MISC: 11 refills | Status: DC

## 2017-02-25 ENCOUNTER — Encounter: Payer: Self-pay | Admitting: Internal Medicine

## 2017-02-26 ENCOUNTER — Other Ambulatory Visit: Payer: Self-pay

## 2017-02-26 MED ORDER — INSULIN PEN NEEDLE 32G X 4 MM MISC: 11 refills | Status: DC

## 2017-03-04 ENCOUNTER — Encounter: Payer: Self-pay | Admitting: Internal Medicine

## 2017-03-15 ENCOUNTER — Encounter: Payer: Self-pay | Admitting: Internal Medicine

## 2017-03-16 ENCOUNTER — Other Ambulatory Visit: Payer: Self-pay

## 2017-03-16 MED ORDER — METFORMIN HCL ER 500 MG PO TB24: 2000.0000 mg | ORAL_TABLET | Freq: Every day | ORAL | 3 refills | Status: DC

## 2017-03-26 ENCOUNTER — Ambulatory Visit (INDEPENDENT_AMBULATORY_CARE_PROVIDER_SITE_OTHER): Payer: BLUE CROSS/BLUE SHIELD | Admitting: Family Medicine

## 2017-03-26 ENCOUNTER — Encounter: Payer: Self-pay | Admitting: Family Medicine

## 2017-03-26 VITALS — BP 126/84 | HR 76 | Temp 98.7°F | Ht 70.0 in | Wt 212.2 lb

## 2017-03-26 DIAGNOSIS — B356 Tinea cruris: Secondary | ICD-10-CM | POA: Diagnosis not present

## 2017-03-26 MED ORDER — CLOTRIMAZOLE-BETAMETHASONE 1-0.05 % EX CREA: 1.0000 "application " | TOPICAL_CREAM | Freq: Two times a day (BID) | CUTANEOUS | 0 refills | Status: DC

## 2017-03-26 NOTE — Patient Instructions (Signed)
Take a shower tonight to remove all residual topicals that have been used  Then start with lotrisone cream (antifungal and anti itch). Use for 10 days. If no better- return to see us.

## 2017-03-26 NOTE — Progress Notes (Signed)
Subjective:  Allen DerryVincent P Mcbrearty is a 41 y.o. year old very pleasant male patient who presents for/with See problem oriented charting ROS- see ROS below   Past Medical History-  Patient Active Problem List   Diagnosis Date Noted  . Bipolar depression (HCC) 03/23/2013    Priority: High  . Asthma 04/25/2013    Priority: Medium  . HYPERTRIGLYCERIDEMIA 10/14/2010    Priority: Medium  . 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 Former smoker 06/27/2015    Priority: Low  . Rectal bleeding 08/22/2014    Priority: Low  . Hx of pancreatitis 02/07/2014    Priority: Low  . Type 2 diabetes mellitus with hyperglycemia, without long-term current use of insulin (HCC) 02/11/2016    Medications- reviewed and updated Current Outpatient Prescriptions  Medication Sig Dispense Refill  . fenofibrate (TRICOR) 145 MG tablet Take 1 tablet (145 mg total) by mouth daily. 90 tablet 1  . glucose blood (FREESTYLE LITE) test strip Use 5x a day 500 each 3  . insulin aspart (NOVOLOG FLEXPEN) 100 UNIT/ML FlexPen Inject under skin 15-20 units 2x a day before meals 45 mL 3  . Insulin Glargine (LANTUS SOLOSTAR) 100 UNIT/ML Solostar Pen Inject 20 Units into the skin daily at 10 pm. 15 pen 3  . Insulin Pen Needle 32G X 4 MM MISC Use three times a day for insulin pens 300 each 11  . lithium 300 MG capsule Take by mouth. 1 every morning , 4 every evening    . metFORMIN (GLUCOPHAGE-XR) 500 MG 24 hr tablet Take 4 tablets (2,000 mg total) by mouth daily with supper. 360 tablet 3  . propranolol (INDERAL) 40 MG tablet   0  . SAPHRIS 10 MG SUBL Take 15 mg by mouth at bedtime. Reported on 03/27/2016  0  . clotrimazole-betamethasone (LOTRISONE) cream Apply 1 application topically 2 (two) times daily. For 10 days 45 g 0   No current facility-administered medications for this visit.     Objective: BP 126/84 (BP Location: Left Arm, Patient Position: Sitting, Cuff Size: Large)   Pulse 76   Temp 98.7 F (37.1 C)  (Oral)   Ht 5\' 10"  (1.778 m)   Wt 212 lb 3.2 oz (96.3 kg)   SpO2 95%   BMI 30.45 kg/m  Gen: NAD, resting comfortably CV: RRR no murmurs rubs or gallops Lungs: CTAB no crackles, wheeze, rhonchi Abdomen: soft/nontender/nondistended/normal bowel sounds. No rebound or guarding.  Ext: no edema Skin: warm, dry GU- on scrotum there is a red rash with somewhat clear border only obscurred by white cream that has been placed on top (desitin). Around rectum there is similar area with slightly lighter shade of red extending at least 4-5 cm each directoin Also has small hemorrhoid tag. .   Assessment/Plan:  Rash S:about 4 weeks ago noted itching at and around rectum. Noted some mild redness. Very pruritic. 2 weeks ago noted similar issue in scrotum area. Also very pruritic. Area is red as well.   He tried Jock itch powder with desitin on scrotum 4 days. Reduced itch rash present still. Only used desitin around anus without relief.  ROS-not ill appearing, no fever/chills. No new medications other than topicals tried. Not immunocompromised. No mucus membrane involvement.  A/P: this appears to be tinea cruris. With how intense pruritis is- we opted to use lotrisone instead of fungal cream alone. If no better in 10 days or worsens he knows to follow up.   Meds ordered this encounter  Medications  . clotrimazole-betamethasone (LOTRISONE) cream    Sig: Apply 1 application topically 2 (two) times daily. For 10 days    Dispense:  45 g    Refill:  0    Return precautions advised.  Tana Conch, MD

## 2017-03-28 ENCOUNTER — Encounter: Payer: Self-pay | Admitting: Family Medicine

## 2017-04-07 ENCOUNTER — Encounter: Payer: Self-pay | Admitting: Family Medicine

## 2017-04-08 ENCOUNTER — Encounter: Payer: Self-pay | Admitting: Family Medicine

## 2017-04-09 ENCOUNTER — Other Ambulatory Visit: Payer: Self-pay | Admitting: Family Medicine

## 2017-04-27 ENCOUNTER — Encounter: Payer: Self-pay | Admitting: Family Medicine

## 2017-05-04 ENCOUNTER — Encounter: Payer: Self-pay | Admitting: Family Medicine

## 2017-05-05 ENCOUNTER — Other Ambulatory Visit: Payer: Self-pay

## 2017-05-05 DIAGNOSIS — B356 Tinea cruris: Secondary | ICD-10-CM

## 2017-06-01 ENCOUNTER — Ambulatory Visit (INDEPENDENT_AMBULATORY_CARE_PROVIDER_SITE_OTHER): Payer: BLUE CROSS/BLUE SHIELD | Admitting: Internal Medicine

## 2017-06-01 ENCOUNTER — Encounter: Payer: Self-pay | Admitting: Internal Medicine

## 2017-06-01 VITALS — BP 120/82 | HR 65 | Wt 208.2 lb

## 2017-06-01 DIAGNOSIS — E781 Pure hyperglyceridemia: Secondary | ICD-10-CM

## 2017-06-01 DIAGNOSIS — E1165 Type 2 diabetes mellitus with hyperglycemia: Secondary | ICD-10-CM

## 2017-06-01 LAB — POCT GLYCOSYLATED HEMOGLOBIN (HGB A1C): Hemoglobin A1C: 7.5

## 2017-06-01 NOTE — Patient Instructions (Addendum)
Please continue: - Metformin 1000 mg to 2x a day - Lantus 20 units at bedtime, but increase to 24 units if sugars are not improving  Please move: - Novolog 15-20 units to before meals  Please return in 3 months with your sugar log.

## 2017-06-01 NOTE — Progress Notes (Signed)
Patient ID: Cody Short, male   DOB: [birth year], 41 y.o.   MRN: [id number]  HPI: Cody Short is a 41 y.o.-year-old male, returning for follow-up for DM 2, dx in ~39, insulin-dependent, uncontrolled, without long term complications. He is here with his mother who offers part of the history. Last visit 3.5 mo ago.  He is doing a partly vegan diet. Sugars worse in last month after reverting to a daylight circadian rhythm.  Last hemoglobin A1c was: Lab Results  Component Value Date   HGBA1C 8.9 02/10/2017   HGBA1C 8.7 11/11/2016   HGBA1C 7.9 06/27/2016   Pt is on a regimen of: - Metformin 1000 mg to 2x a day - Lantus 20 units at bedtime - Novolog 15-20 units AFTER a meal He was on Lantus 20 >> Now off. He tried Glipizide >> hypoglycemia in the 40s repeatedly. Lowest: 25. Also, Glipizide 2.5 mg in am >> nausea, vomiting, constipation We tried Invokana >> bothersome urination >> had to stop. He had pancreatitis 2/2 Depakote and HTG in the past. He stopped Actos b/c stomach pain.  We stopped Cycloset >> inefficient, could not use b/c price.  Pt checks his sugars 3-4x a day: - am:  n/c (wakes up late) >> 98, 100 >> 108-137 >> 133 >> 140-259 >> 98-193, 209 - 2h after b'fast: n/c >> 144, 232-262 - before dinner:93 >> n/c >> 70 >> n/c >> 181 >> 156-327 >> 100 - 2h after dinner: 57, 71-136, 167 >> 132-285, 326 >> 145-367 >> 95, 166-286, 335  - bedtime: 99-181, 253 >> 100-142, 174 >> 87-165 >> 220 >> 101-179, 207 (after 1h of exercise) - nighttime: n/c No lows. Lowest sugar was 60 >> 57 >> 87 >> 140 >> 95; he has hypoglycemia awareness at 70.  Highest sugar was 263 >> 167 >> 326 >> 355 >> 335.  Glucometer: FreeStyle  - No CKD, last BUN/creatinine:  Lab Results  Component Value Date   BUN 14 02/10/2017   CREATININE 1.16 02/10/2017   - + HTG.  last set of lipids: Lab Results  Component Value Date   CHOL 217 (H) 02/10/2017   HDL 34.00 (L) 02/10/2017   LDLCALC 67  02/02/2014   LDLDIRECT 113.0 02/10/2017   TRIG (H) 02/10/2017    711.0 Triglyceride is over 400; calculations on Lipids are invalid.   CHOLHDL 6 02/10/2017  On Fenofibrate  - last eye exam was on 03/2017 >> No DR.  - he has numbness; no tingling in his feet. He has recurrent furunculosis - groin.  In 2010, he was on Depakote >> pancreatitis.  ROS: Constitutional: no weight gain/no weight loss, no fatigue, no subjective hyperthermia, no subjective hypothermia Eyes: no blurry vision, no xerophthalmia ENT: no sore throat, no nodules palpated in throat, no dysphagia, no odynophagia, no hoarseness Cardiovascular: no CP/no SOB/no palpitations/no leg swelling Respiratory: no cough/no SOB/no wheezing Gastrointestinal: no N/no V/+ D/no C/no acid reflux Musculoskeletal: no muscle aches/no joint aches Skin: no rashes, no hair loss Neurological: no tremors/+ numbness/no tingling/no dizziness  I reviewed pt's medications, allergies, PMH, social hx, family hx, and changes were documented in the history of present illness. Otherwise, unchanged from my initial visit note.  Past Medical History:  Diagnosis Date  . 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 Bipolar affective disorder (HCC)   . DM w/o Complication Type II 07/05/2007  . HYPERTRIGLYCERIDEMIA 10/14/2010  . Nocturia 07/04/2010  . PILAR CYST 08/03/2007   Cyst in groin-states comes up when blood sugar  gets high. Recurs if cannot walk for a week.     . TOBACCO ABUSE, HX OF 07/31/2009   Past Surgical History:  Procedure Laterality Date  . pilar cystectomy     History   Social History  . Marital Status: Single    Spouse Name: N/A  . Number of Children: 0   Occupational History  .    Social History Main Topics  . Smoking status: Former Smoker - quit in 2010  . Smokeless tobacco: Never Used  . Alcohol Use: No     Comment: none at all  . Drug Use: No   Current Outpatient Prescriptions on File Prior to Visit  Medication Sig Dispense Refill   . fenofibrate (TRICOR) 145 MG tablet Take 1 tablet (145 mg total) by mouth daily. 90 tablet 1  . Insulin Pen Needle 32G X 4 MM MISC Use once a day 100 each 11  . lithium 300 MG capsule Take by mouth. 1 every morning , 4 every evening    . propranolol (INDERAL) 40 MG tablet   0  . SAPHRIS 10 MG SUBL Take 15 mg by mouth at bedtime. Reported on 03/27/2016  0  + Novolog - see HPI  No current facility-administered medications on file prior to visit.    Allergies  Allergen Reactions  . Divalproex Sodium Other (See Comments)    unknown  . Methylphenidate Hcl     Aggravate bipolar  . Oxycodone Hcl     REACTION: hallucinations  . Paroxetine     Aggravate bipolar disorder   Family History  Problem Relation Age of Onset  . Diabetes Mother   . Diabetes Father   . Colon cancer Neg Hx   . Stomach cancer Neg Hx   . Melanoma Maternal Uncle   . Pancreatitis Neg Hx   . Heart disease Neg Hx   . Kidney disease Neg Hx   . Liver disease Neg Hx    PE: BP 120/82   Pulse 65   Wt 208 lb 3.2 oz (94.4 kg)   SpO2 99%   BMI 29.87 kg/m   Body mass index is 29.87 kg/m. Wt Readings from Last 3 Encounters:  06/01/17 208 lb 3.2 oz (94.4 kg)  03/26/17 212 lb 3.2 oz (96.3 kg)  02/10/17 213 lb 12.8 oz (97 kg)   Constitutional: overweight, in NAD Eyes: PERRLA, EOMI, no exophthalmos ENT: moist mucous membranes, no thyromegaly, no cervical lymphadenopathy Cardiovascular: RRR, No MRG Respiratory: CTA B Gastrointestinal: abdomen soft, NT, ND, BS+ Musculoskeletal: no deformities, strength intact in all 4 Skin: moist, warm, no rashes Neurological: no tremor with outstretched hands, DTR normal in all 4  ASSESSMENT: 1. DM2, non-insulin-dependent, uncontrolled, without long term complications, but with hyperglycemia - tested neg for DM1: Component     Latest Ref Rng 05/28/2015  C-Peptide     0.80 - 3.90 ng/mL 2.88  Glucose, Fasting     65 - 99 mg/dL 161 (H)  Glutamic Acid Decarb Ab     <5 IU/mL <5   Pancreatic Islet Cell Antibody     < 5 JDF Units <5   2. HTG  PLAN:  1. Patient with long-standing, uncontrolled DM, with worsening sugars after a period of good control in 04-02/2017. The sugars are worse after his vacation.  - I advised him to start taking mealtime insulin before, rather than after meals as he is taking it now. Will continue the Metformin and try his new btch of lantus. I advised him  to increase the dose if needed after a few days. - His DM is difficult to manage b/c multiple intolerances: - We tried to add Invokana but he did not tolerated due to increased urination.  - We cannot use a DPP 4 inhibitor or a GLP-1 receptor agonist due to his history of pancreatitis.  - He had to stop Actos >>  abd. Pain - we stopped Cycloset >> expensive and not effective - we tried Glipizide >> GI sxs: N/V/D.  - I advised him to: Patient Instructions  Please continue: - Metformin 1000 mg to 2x a day - Lantus 20 units at bedtime, but increase to 24 units if sugars are not improving  Please move: - Novolog 15-20 units to before meals  Please return in 3 months with your sugar log.   - today, HbA1c is 7.5% (lower) - bu latest sugars are worse - continue checking sugars at different times of the day - check 2-3x a day, rotating checks - advised for yearly eye exams >> he is UTD - Return to clinic in 3 mo with sugar log   2. HTG - reviewed last TG level - continue Fenofibrate, and will increase insulin, which should help - again discussed the need to go back fully to the vegan diet  Carlus Pavlovristina Aaliya Maultsby, MD PhD [facility]

## 2017-09-01 ENCOUNTER — Ambulatory Visit: Payer: BLUE CROSS/BLUE SHIELD | Admitting: Internal Medicine

## 2017-09-01 ENCOUNTER — Encounter: Payer: Self-pay | Admitting: Internal Medicine

## 2017-09-01 VITALS — BP 122/82 | HR 71 | Wt 199.0 lb

## 2017-09-01 DIAGNOSIS — E781 Pure hyperglyceridemia: Secondary | ICD-10-CM

## 2017-09-01 DIAGNOSIS — Z23 Encounter for immunization: Secondary | ICD-10-CM

## 2017-09-01 DIAGNOSIS — E1165 Type 2 diabetes mellitus with hyperglycemia: Secondary | ICD-10-CM | POA: Diagnosis not present

## 2017-09-01 LAB — POCT GLYCOSYLATED HEMOGLOBIN (HGB A1C)
HEMOGLOBIN A1C: 7.1
Hemoglobin A1C: 7.1

## 2017-09-01 NOTE — Progress Notes (Signed)
Patient ID: Cody Short, male   DOB: 11-21-1975, 41 y.o.   MRN: [id number]  HPI: Cody Short is a 41 y.o.-year-old male, returning for follow-up for DM 2, dx in ~2010, insulin-dependent, uncontrolled, without long term complications. Last visit 3 mo ago.  He is doing a mostly vegan diet.He lost 9 lbs since last visit!  Last hemoglobin A1c was: 05/29/2017 Lab Results  Component Value Date   HGBA1C 8.9 02/10/2017   HGBA1C 8.7 11/11/2016   HGBA1C 7.9 06/27/2016   Pt is on a regimen of: - Metformin 1000 mg to 2x a day  - Novolog 7-14 units after dinner He was on Lantus 20 >> Now off. He tried Glipizide >> hypoglycemia in the 40s repeatedly. Lowest: 25. Also, Glipizide 2.5 mg in am >> nausea, vomiting, constipation We tried Invokana >> bothersome urination >> had to stop. He had pancreatitis 2/2 Depakote and HTG in the past. He stopped Actos b/c stomach pain.  We stopped Cycloset >> inefficient, could not use b/c price.  Pt checks his sugars 3-4x a day: - am:  n/c133 >> 140-259 >> 98-193, 209 >> 112-156 - 2h after b'fast: n/c >> 144, 232-262 >> n/c - before dinner: 181 >> 156-327 >> 100 >> n/c - 2h after dinner: 145-367 >> 95, 166-286, 335 >> 115-247, 281 - bedtime: 87-165 >> 220 >> 101-179, 207 (after 1h of exercise) >> 102-157, 193 - nighttime: n/c >> 91-136 Lowest sugar was 95 >> 91; he has hypoglycemia awareness at 70.  Highest sugar was 335 >> 281  Glucometer: FreeStyle  - No CKD, last BUN/creatinine:  Lab Results  Component Value Date   BUN 14 02/10/2017   CREATININE 1.16 02/10/2017   - + HTG.  last set of lipids: Lab Results  Component Value Date   CHOL 217 (H) 02/10/2017   HDL 34.00 (L) 02/10/2017   LDLCALC 67 02/02/2014   LDLDIRECT 113.0 02/10/2017   TRIG (H) 02/10/2017    711.0 Triglyceride is over 400; calculations on Lipids are invalid.   CHOLHDL 6 02/10/2017  On fenofibrate - last eye exam was on 03/2017 >> No DR.  - + numbness; no  tingling in his feet. He has recurrent furunculosis - groin.  In 2010, he was on Depakote >> pancreatitis.  ROS: Constitutional: + 9 lb weight loss, no fatigue, no subjective hyperthermia, no subjective hypothermia Eyes: no blurry vision, no xerophthalmia ENT: no sore throat, no nodules palpated in throat, no dysphagia, no odynophagia, no hoarseness Cardiovascular: no CP/no SOB/no palpitations/no leg swelling Respiratory: no cough/no SOB/no wheezing Gastrointestinal: no N/no V/no D/no C/no acid reflux Musculoskeletal: no muscle aches/no joint aches Skin: no rashes, no hair loss Neurological: no tremors/+ numbness/no tingling/no dizziness  I reviewed pt's medications, allergies, PMH, social hx, family hx, and changes were documented in the history of present illness. Otherwise, unchanged from my initial visit note.  Past Medical History:  Diagnosis Date  . 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 Bipolar affective disorder (HCC)   . DM w/o Complication Type II 07/05/2007  . HYPERTRIGLYCERIDEMIA 10/14/2010  . Nocturia 07/04/2010  . PILAR CYST 08/03/2007   Cyst in groin-states comes up when blood sugar gets high. Recurs if cannot walk for a week.     . TOBACCO ABUSE, HX OF 07/31/2009   Past Surgical History:  Procedure Laterality Date  . pilar cystectomy     History   Social History  . Marital Status: Single    Spouse Name: N/A  . Number of  Children: 0   Occupational History  .    Social History Main Topics  . Smoking status: Former Smoker - quit in 2010  . Smokeless tobacco: Never Used  . Alcohol Use: No     Comment: none at all  . Drug Use: No   Current Outpatient Prescriptions on File Prior to Visit  Medication Sig Dispense Refill  . fenofibrate (TRICOR) 145 MG tablet Take 1 tablet (145 mg total) by mouth daily. 90 tablet 1  . Insulin Pen Needle 32G X 4 MM MISC Use once a day 100 each 11  . lithium 300 MG capsule Take by mouth. 1 every morning , 4 every evening    . propranolol  (INDERAL) 40 MG tablet   0  . SAPHRIS 10 MG SUBL Take 15 mg by mouth at bedtime. Reported on 03/27/2016  0  + Novolog - see HPI  No current facility-administered medications on file prior to visit.    Allergies  Allergen Reactions  . Divalproex Sodium Other (See Comments)    unknown  . Methylphenidate Hcl     Aggravate bipolar  . Oxycodone Hcl     REACTION: hallucinations  . Paroxetine     Aggravate bipolar disorder   Family History  Problem Relation Age of Onset  . Diabetes Mother   . Diabetes Father   . Colon cancer Neg Hx   . Stomach cancer Neg Hx   . Melanoma Maternal Uncle   . Pancreatitis Neg Hx   . Heart disease Neg Hx   . Kidney disease Neg Hx   . Liver disease Neg Hx    PE: BP 122/82 (BP Location: Left Arm, Patient Position: Sitting)   Pulse 71   Wt 199 lb (90.3 kg)   SpO2 97%   BMI 28.55 kg/m  Body mass index is 28.55 kg/m. Wt Readings from Last 3 Encounters:  09/01/17 199 lb (90.3 kg)  06/01/17 208 lb 3.2 oz (94.4 kg)  03/26/17 212 lb 3.2 oz (96.3 kg)   Constitutional: overweight, in NAD Eyes: PERRLA, EOMI, no exophthalmos ENT: moist mucous membranes, no thyromegaly, no cervical lymphadenopathy Cardiovascular: RRR, No MRG Respiratory: CTA B Gastrointestinal: abdomen soft, NT, ND, BS+ Musculoskeletal: no deformities, strength intact in all 4 Skin: moist, warm, no rashes Neurological: no tremor with outstretched hands, DTR normal in all 4  ASSESSMENT: 1. DM2, insulin-dependent, uncontrolled, without long term complications, but with hyperglycemia - tested neg for DM1: Component     Latest Ref Rng 05/28/2015  C-Peptide     0.80 - 3.90 ng/mL 2.88  Glucose, Fasting     65 - 99 mg/dL 161120 (H)  Glutamic Acid Decarb Ab     <5 IU/mL <5  Pancreatic Islet Cell Antibody     < 5 JDF Units <5   2. HTG  PLAN:  1. Patient with long-standing, uncontrolled DM, with now better control after starting back on a mostly vegan diet - with occas. Cheese/creamer. He  lost 9 lbs and sugars are better He stopped Lantus and was also off Novolog but in last 2 weeks sugars are a little higher as he is exercising less (he usualyy exercises after dinner) as he got fatigued. He added back Novolog with dinner but still takes it after dinner >> suagrs after dinner are high and then decrease after exercise. I strongly advised him to move this  Before dinner but if he continues with the diet and exercise as well as weight loss >> we may not  need the NovoLog. Will also reduce the Novolog as he is not taking as much as Rx'ed at last visit. - His DM is difficult to manage b/c multiple intolerances: - We tried to add Invokana but he did not tolerated due to increased urination.  - We cannot use a DPP 4 inhibitor or a GLP-1 receptor agonist due to his history of pancreatitis.  - He had to stop Actos >>  abd. Pain - we stopped Cycloset >> expensive and not effective - we tried Glipizide >> GI sxs: N/V/D.  - I advised him to: Patient Instructions   Please continue: - Metformin 1000 mg to 2x a day  Stay off Lantus.  Use Novolog as follows before dinner:    No exercise + exercise  Small meal 7 units  0 units  Larger meal 12 units 7 units   Please return in 3 months with your sugar log.   - today, HbA1c is 7.1% (much better) - continue checking sugars at different times of the day - check 1-3x a day, rotating checks - advised for yearly eye exams >> he is UTD - + flu shot today - Return to clinic in 3 mo with sugar log  2. HTG - reviewed last TG level - continue Fenofibrate - diet is better  Carlus Pavlovristina Sigmond Patalano, MD PhD [facility]

## 2017-09-01 NOTE — Addendum Note (Signed)
Addended by: Darene LamerHOMPSON, Colbert Curenton T on: 09/01/2017 04:30 PM   Modules accepted: Orders

## 2017-09-01 NOTE — Patient Instructions (Addendum)
Please continue: - Metformin 1000 mg to 2x a day  Stay off Lantus.  Use Novolog as follows before dinner:    No exercise + exercise  Small meal 7 units  0 units  Larger meal 12 units 7 units   Please return in 3 months with your sugar log.

## 2017-11-02 ENCOUNTER — Other Ambulatory Visit: Payer: Self-pay | Admitting: Family Medicine

## 2017-12-03 ENCOUNTER — Ambulatory Visit: Payer: BLUE CROSS/BLUE SHIELD | Admitting: Internal Medicine

## 2017-12-03 ENCOUNTER — Encounter: Payer: Self-pay | Admitting: Internal Medicine

## 2017-12-03 VITALS — BP 118/64 | HR 70 | Temp 98.0°F | Ht 70.0 in | Wt 198.4 lb

## 2017-12-03 DIAGNOSIS — E781 Pure hyperglyceridemia: Secondary | ICD-10-CM | POA: Diagnosis not present

## 2017-12-03 DIAGNOSIS — E1165 Type 2 diabetes mellitus with hyperglycemia: Secondary | ICD-10-CM | POA: Diagnosis not present

## 2017-12-03 LAB — POCT GLYCOSYLATED HEMOGLOBIN (HGB A1C): HEMOGLOBIN A1C: 7.4

## 2017-12-03 NOTE — Patient Instructions (Addendum)
Please continue: - Metformin 1000 mg 2x a day  Please change: - Novolog as follows before dinner:  25-28 units if not exercising 0-12 units if exercising  Please return in 3-4 months with your sugar log.

## 2017-12-03 NOTE — Progress Notes (Signed)
Patient ID: Cody Short, male   DOB: July 06, 1976, 42 y.o.   MRN: [id number]  HPI: Cody Short is a 42 y.o.-year-old male, returning for follow-up for DM 2, dx in ~2010, insulin-dependent, uncontrolled, without long term complications. Last visit 3 mo ago.  He continues a mostly vegan diet.  Last hemoglobin A1c was: 05/29/2017 Lab Results  Component Value Date   HGBA1C 7.1 09/01/2017   HGBA1C 7.1 09/01/2017   HGBA1C 7.5 06/01/2017   Pt is on a regimen of: - Metformin 1000 mg to 2x a day - Novolog as follows before dinner:  No exercise + exercise  Small meal 7 units  0 units  Larger meal 12 units 7 units   He was on Lantus 20 >> Now off. He tried Glipizide >> hypoglycemia in the 40s repeatedly. Lowest: 25. Also, Glipizide 2.5 mg in am >> nausea, vomiting, constipation We tried Invokana >> bothersome urination >> had to stop. He had pancreatitis 2/2 Depakote and HTG in the past. He stopped Actos b/c stomach pain.  We stopped Cycloset >> inefficient, could not use b/c price.  Pt checks his sugars 3-4x a day: - am:98-193, 209 >> 112-156 >> 122-149, 182 - 2h after b'fast: n/c >> 144, 232-262 >> n/c - before dinner: 156-327 >> 100 >> n/c >> 204 - 2h after dinner: 95, 166-286, 335 >> 115-247, 281 >> 91, 126-206 - bedtime: 101-179, 207>> 102-157, 193 >> 87-136 - nighttime: n/c >> 91-136 >> 139 Lowest sugar was 95 >> 91 >> 87; he has hypoglycemia awareness in the 70s. Highest sugar was 335 >> 281 >> 278.  Glucometer: FreeStyle  - No CKD, last BUN/creatinine:  Lab Results  Component Value Date   BUN 14 02/10/2017   CREATININE 1.16 02/10/2017   - + HTG;  last set of lipids: Lab Results  Component Value Date   CHOL 217 (H) 02/10/2017   HDL 34.00 (L) 02/10/2017   LDLCALC 67 02/02/2014   LDLDIRECT 113.0 02/10/2017   TRIG (H) 02/10/2017    711.0 Triglyceride is over 400; calculations on Lipids are invalid.   CHOLHDL 6 02/10/2017  On fenofibrate. - last eye  exam was on 03/2017 >> No DR - + numbness; no tingling in his feet. He has recurrent furunculosis - groin.  In 2010, he was on Depakote >> pancreatitis.  ROS: Constitutional: no weight gain/no weight loss, no fatigue, no subjective hyperthermia, no subjective hypothermia Eyes: no blurry vision, no xerophthalmia ENT: no sore throat, no nodules palpated in throat, no dysphagia, no odynophagia, no hoarseness Cardiovascular: no CP/no SOB/no palpitations/no leg swelling Respiratory: no cough/no SOB/no wheezing Gastrointestinal: no N/no V/no D/no C/no acid reflux Musculoskeletal: no muscle aches/no joint aches Skin: no rashes, no hair loss Neurological: no tremors/+ numbness/no tingling/no dizziness  I reviewed pt's medications, allergies, PMH, social hx, family hx, and changes were documented in the history of present illness. Otherwise, unchanged from my initial visit note.  Past Medical History:  Diagnosis Date  . 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 Bipolar affective disorder (HCC)   . DM w/o Complication Type II 07/05/2007  . HYPERTRIGLYCERIDEMIA 10/14/2010  . Nocturia 07/04/2010  . PILAR CYST 08/03/2007   Cyst in groin-states comes up when blood sugar gets high. Recurs if cannot walk for a week.     . TOBACCO ABUSE, HX OF 07/31/2009   Past Surgical History:  Procedure Laterality Date  . pilar cystectomy     History   Social History  . Marital Status: Single  Spouse Name: N/A  . Number of Children: 0   Occupational History  .    Social History Main Topics  . Smoking status: Former Smoker - quit in 2010  . Smokeless tobacco: Never Used  . Alcohol Use: No     Comment: none at all  . Drug Use: No   Current Outpatient Prescriptions on File Prior to Visit  Medication Sig Dispense Refill  . fenofibrate (TRICOR) 145 MG tablet Take 1 tablet (145 mg total) by mouth daily. 90 tablet 1  . Insulin Pen Needle 32G X 4 MM MISC Use once a day 100 each 11  . lithium 300 MG capsule Take by  mouth. 1 every morning , 4 every evening    . propranolol (INDERAL) 40 MG tablet   0  . SAPHRIS 10 MG SUBL Take 15 mg by mouth at bedtime. Reported on 03/27/2016  0  + Novolog - see HPI  No current facility-administered medications on file prior to visit.    Allergies  Allergen Reactions  . Divalproex Sodium Other (See Comments)    unknown  . Methylphenidate Hcl     Aggravate bipolar  . Oxycodone Hcl     REACTION: hallucinations  . Paroxetine     Aggravate bipolar disorder   Family History  Problem Relation Age of Onset  . Diabetes Mother   . Diabetes Father   . Colon cancer Neg Hx   . Stomach cancer Neg Hx   . Melanoma Maternal Uncle   . Pancreatitis Neg Hx   . Heart disease Neg Hx   . Kidney disease Neg Hx   . Liver disease Neg Hx    PE: BP 118/64 (BP Location: Left Arm, Patient Position: Sitting, Cuff Size: Normal)   Pulse 70   Temp 98 F (36.7 C) (Oral)   Ht 5\' 10"  (1.778 m)   Wt 198 lb 6.4 oz (90 kg)   SpO2 94%   BMI 28.47 kg/m  Body mass index is 28.47 kg/m. Wt Readings from Last 3 Encounters:  12/03/17 198 lb 6.4 oz (90 kg)  09/01/17 199 lb (90.3 kg)  06/01/17 208 lb 3.2 oz (94.4 kg)   Constitutional: overweight, in NAD Eyes: PERRLA, EOMI, no exophthalmos ENT: moist mucous membranes, no thyromegaly, no cervical lymphadenopathy Cardiovascular: RRR, No MRG Respiratory: CTA B Gastrointestinal: abdomen soft, NT, ND, BS+ Musculoskeletal: no deformities, strength intact in all 4 Skin: moist, warm, no rashes Neurological: no tremor with outstretched hands, DTR normal in all 4   ASSESSMENT: 1. DM2, insulin-dependent, uncontrolled, without long term complications, but with hyperglycemia - tested neg for DM1: Component     Latest Ref Rng 05/28/2015  C-Peptide     0.80 - 3.90 ng/mL 2.88  Glucose, Fasting     65 - 99 mg/dL 161120 (H)  Glutamic Acid Decarb Ab     <5 IU/mL <5  Pancreatic Islet Cell Antibody     < 5 JDF Units <5   2. HTG  PLAN:  1. Patient  with long-standing, uncontrolled, type 2 diabetes, with better control at last visit after starting back on a mostly vegan diet with occasional cheese/creamer.  He lost weight and his sugars were much better.  Therefore, he was able to come off Lantus and also off NovoLog, but sugars were higher after dinner so we added NovoLog back before dinner. - he restarted exercise after a few week pause >> sugars initially high, now improved >> average in last 2 weeks: 148 - advised  him to continue to exercise and follow the vegan diet, but will give him a regimen if he does or does not exercise.  - Diabetes is difficult to manage because of multiple intolerances: - We tried to add Invokana but he did not tolerated due to increased urination.  - We cannot use a DPP 4 inhibitor or a GLP-1 receptor agonist due to his history of pancreatitis.  - He had to stop Actos >>  abd. Pain - we stopped Cycloset >> expensive and not effective - we tried Glipizide >> GI sxs: N/V/D - I advised him to: Patient Instructions  Please continue: - Metformin 1000 mg 2x a day  Please change: - Novolog as follows before dinner:  25-28 units if not exercising 0-12 units if exercising  Please return in 3-4 months with your sugar log.  - today, HbA1c is 7.4% (higher) - continue checking sugars at different times of the day - check 2x a day, rotating checks - advised for yearly eye exams >> he is UTD - UTD with flu shots - Return to clinic in 3-4 mo with sugar log   2. HTG - reviewed last TG level >> high >> started Fenofibrate, also started back on a vegan diet - continue Fenofibrate >> no SEs - continue exercise and vegan diet - repeat Lipid panel at last visit.  Carlus Pavlov, MD PhD [facility]

## 2017-12-14 ENCOUNTER — Encounter: Payer: Self-pay | Admitting: Family Medicine

## 2017-12-15 ENCOUNTER — Ambulatory Visit: Payer: BLUE CROSS/BLUE SHIELD | Admitting: Family Medicine

## 2017-12-15 ENCOUNTER — Encounter: Payer: Self-pay | Admitting: Family Medicine

## 2017-12-15 VITALS — BP 147/94 | HR 67 | Temp 98.7°F | Ht 70.0 in | Wt 204.0 lb

## 2017-12-15 DIAGNOSIS — K625 Hemorrhage of anus and rectum: Secondary | ICD-10-CM

## 2017-12-15 DIAGNOSIS — J310 Chronic rhinitis: Secondary | ICD-10-CM | POA: Diagnosis not present

## 2017-12-15 DIAGNOSIS — K648 Other hemorrhoids: Secondary | ICD-10-CM

## 2017-12-15 MED ORDER — IPRATROPIUM BROMIDE 0.03 % NA SOLN: 2.0000 | Freq: Three times a day (TID) | NASAL | 11 refills | Status: DC

## 2017-12-15 MED ORDER — HYDROCORTISONE ACETATE 25 MG RE SUPP: 25.0000 mg | Freq: Two times a day (BID) | RECTAL | 0 refills | Status: DC

## 2017-12-15 NOTE — Assessment & Plan Note (Signed)
S: has had hemorrhoids for several years and had rectal bleeding with them in the past. Does not feel external hemorrhoid. Starting 8 months ago has had mores persistent symptoms with itching, bleeding on tissue, irritation. Used over the counter preparation H with applicator and has some relief of symptoms with this - for about 3 hours then has to use again. Denies ever using anything like anusol HC.  A/P: 3-4 years intermittent rectal bleeding- suspect hemorrhoid but will refer to GI for their opinion as well as possible banding of hemorrhoids. Will use hydrocortisone suppositories over next 7 days to see if we can give him more sustained relief than he has had with preparation H

## 2017-12-15 NOTE — Assessment & Plan Note (Signed)
S:  for years has had issues with runny nose after eating meals- seems to be anything he eats but does eat a lot of spicy foods. Tends to be worse with dinner. No hives, shortness of breath A/P: Can use ipratropium nasal spray before meals. Perhaps just with dinner since that is the main time you are affected. Could cut down on spicy foods as well which helps.

## 2017-12-15 NOTE — Progress Notes (Signed)
Subjective:  Cody Short is a 42 y.o. year old very pleasant male patient who presents for/with See problem oriented charting ROS- has had intermittent rectal bleeding still. No chest pain or shortness of breath or lightheadedness. Complains of boils on buttocks at time   Past Medical History-  Patient Active Problem List   Diagnosis Date Noted  . Bipolar depression (HCC) 03/23/2013    Priority: High  . Asthma 04/25/2013    Priority: Medium  . HYPERTRIGLYCERIDEMIA 10/14/2010    Priority: Medium  . 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 Former smoker 06/27/2015    Priority: Low  . Rectal bleeding 08/22/2014    Priority: Low  . Hx of pancreatitis 02/07/2014    Priority: Low  . 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 Type 2 diabetes mellitus with hyperglycemia, without long-term current use of insulin (HCC) 02/11/2016    Medications- reviewed and updated Current Outpatient Medications  Medication Sig Dispense Refill  . ciclopirox (PENLAC) 8 % solution 1 ML APPLY TO NAILS DAILY APPLY ONCE DAILY TO AFFECTED NAILS  2  . clotrimazole-betamethasone (LOTRISONE) cream Apply 1 application topically 2 (two) times daily. For 10 days 45 g 0  . fenofibrate (TRICOR) 145 MG tablet TAKE 1 TABLET BY MOUTH DAILY 90 tablet 1  . glucose blood (FREESTYLE LITE) test strip Use 5x a day 500 each 3  . insulin aspart (NOVOLOG FLEXPEN) 100 UNIT/ML FlexPen Inject under skin 15-20 units 2x a day before meals 45 mL 3  . Insulin Glargine (LANTUS SOLOSTAR) 100 UNIT/ML Solostar Pen Inject 20 Units into the skin daily at 10 pm. 15 pen 3  . Insulin Pen Needle 32G X 4 MM MISC Use three times a day for insulin pens 300 each 11  . lithium 300 MG capsule Take by mouth. 1 every morning , 4 every evening    . metFORMIN (GLUCOPHAGE-XR) 500 MG 24 hr tablet Take 4 tablets (2,000 mg total) by mouth daily with supper. 360 tablet 3  . propranolol (INDERAL) 40 MG tablet   0  . SAPHRIS 10 MG SUBL Take 10 mg at  bedtime by mouth. Reported on 03/27/2016  0  . Sulfacetamide Sodium-Sulfur (SULFACLEANSE 8/4) 8-4 % SUSP Apply topically.    . triamcinolone (KENALOG) 0.025 % cream APPLY TO AFFECTED AREA TWICE A DAY  2   Objective: BP (!) 147/94 (BP Location: Left Arm, Patient Position: Sitting, Cuff Size: Large)   Pulse 67   Temp 98.7 F (37.1 C) (Oral)   Ht 5\' 10"  (1.778 m)   Wt 204 lb (92.5 kg)   SpO2 97%   BMI 29.27 kg/m  Gen: NAD, resting comfortably No rhinorrhea today CV: RRR no murmurs rubs or gallops Lungs: CTAB no crackles, wheeze, rhonchi Abdomen: soft/nontender/nondistended/normal bowel sounds.  Ext: no edema Skin: warm, dry GU: no obvious external hemorrhoids. Did not complete internal exam or anoscopy. Several healed/scabbed over areas- along buttocks and back (history acne)  Assessment/Plan:  Rectal bleeding S: has had hemorrhoids for several years and had rectal bleeding with them in the past. Does not feel external hemorrhoid. Starting 8 months ago has had mores persistent symptoms with itching, bleeding on tissue, irritation. Used over the counter preparation H with applicator and has some relief of symptoms with this - for about 3 hours then has to use again. Denies ever using anything like anusol HC.  A/P: 3-4 years intermittent rectal bleeding- suspect hemorrhoid but will refer to GI for their opinion as well  as possible banding of hemorrhoids. Will use hydrocortisone suppositories over next 7 days to see if we can give him more sustained relief than he has had with preparation H  Gustatory rhinitis S:  for years has had issues with runny nose after eating meals- seems to be anything he eats but does eat a lot of spicy foods. Tends to be worse with dinner. No hives, shortness of breath A/P: Can use ipratropium nasal spray before meals. Perhaps just with dinner since that is the main time you are affected. Could cut down on spicy foods as well which helps.   BP up for first time  today- asymptomatic-- advised of dash eating plan and weight loss and 2 month follow up. Offered visit tomorrow to discuss boils on buttocks intermittently but he declines- he will schedule future visit BP Readings from Last 3 Encounters:  12/15/17 (!) 147/94  12/03/17 118/64  09/01/17 122/82   Future Appointments  Date Time Provider Department Center  12/25/2017  2:00 PM Zehr, Princella Pellegrini, PA-C [facility]  04/02/2018  3:15 PM Carlus Pavlov, MD [facility] None  04/13/2018  2:45 PM Durene Cal Aldine Contes, MD [facility]   Lab/Order associations: Gustatory rhinitis  Rectal bleeding - Plan: Ambulatory referral to Gastroenterology  Internal hemorrhoid - Plan: Ambulatory referral to Gastroenterology  Meds ordered this encounter  Medications  . hydrocortisone (ANUSOL-HC) 25 MG suppository    Sig: Place 1 suppository (25 mg total) rectally 2 (two) times daily.    Dispense:  12 suppository    Refill:  0  . ipratropium (ATROVENT) 0.03 % nasal spray    Sig: Place 2 sprays into both nostrils 3 (three) times daily with meals.    Dispense:  30 mL    Refill:  11   Return precautions advised.  Tana Conch, MD

## 2017-12-15 NOTE — Patient Instructions (Addendum)
3-4 years intermittent rectal bleeding- suspect hemorrhoid but will refer to GI for their opinion as well as possible banding of hemorrhoids. Will use hydrocortisone suppositories over next 7 days to see if we can give him more sustained relief than he has had with preparation H.   Can use ipratropium nasal spray before meals. Perhaps just with dinner since that is the main time you are affected. Could cut down on spicy foods as well which helps.   I have an 8 15 slot tomorrow morning if you would like to follow up to chat about the boils issues- I want to go over some potential solutions with you but want to be able to have some time to go in more depth about what I think is going on and options to treat this- or we can do this at a later date

## 2017-12-16 ENCOUNTER — Encounter: Payer: Self-pay | Admitting: Family Medicine

## 2017-12-20 ENCOUNTER — Encounter: Payer: Self-pay | Admitting: Family Medicine

## 2017-12-25 ENCOUNTER — Ambulatory Visit: Payer: BLUE CROSS/BLUE SHIELD | Admitting: Gastroenterology

## 2017-12-25 ENCOUNTER — Encounter: Payer: Self-pay | Admitting: Gastroenterology

## 2017-12-25 VITALS — BP 118/70 | HR 62 | Ht 70.0 in | Wt 203.0 lb

## 2017-12-25 DIAGNOSIS — K648 Other hemorrhoids: Secondary | ICD-10-CM | POA: Diagnosis not present

## 2017-12-25 DIAGNOSIS — L290 Pruritus ani: Secondary | ICD-10-CM | POA: Diagnosis not present

## 2017-12-25 MED ORDER — HYDROCORTISONE 2.5 % RE CREA: 1.0000 "application " | TOPICAL_CREAM | Freq: Every evening | RECTAL | 0 refills | Status: DC | PRN

## 2017-12-25 MED ORDER — HYDROCORTISONE 2.5 % RE CREA: 1.0000 "application " | TOPICAL_CREAM | Freq: Two times a day (BID) | RECTAL | 1 refills | Status: DC

## 2017-12-25 NOTE — Patient Instructions (Addendum)
If you are age 40 or older, your body mass index should be between 23-30. Your Body mass index is 29.13 kg/m. If this is out of the aforementioned range listed, please consider follow up with your Primary Care Provider.  If you are age 40 or younger, your body mass index should be between 19-25. Your Body mass index is 29.13 kg/m. If this is out of the aformentioned range listed, please consider follow up with your Primary Care Provider.   We have sent the following medications to your pharmacy for you to pick up at your convenience: Hydrocortisone cream 2.5%  Use Preparation H Suppositories (over-the-counter) with Hydrocortisone cream.   Use Desitin to peri-anal area at bedtime as needed.  You have been given Hemorrhoid banding information.  Call back if interested.  Follow up as needed.  Thank you for choosing me and [facility].  Doug SouJessica Zehr, PA-C

## 2017-12-25 NOTE — Progress Notes (Signed)
12/25/2017 Cody Short [id number] 06-23-1976   HISTORY OF PRESENT ILLNESS: This is a 42 year old male who is known to me from a visit in April 2015 with complaints of epigastric abdominal pain.  He returns here today at the request of his PCP, Dr. Durene CalHunter, for evaluation regarding hemorrhoids with complaints of perianal itching, burning, discomfort.  He tells me that this issue has been going on for the past couple of years.  Has used Preparation H creams with minimal relief.  Recently has been using hydrocortisone suppositories, which is helped some of the discomfort in his rectum.  Still having a lot of external discomfort.  Says that he is seen only very small amounts of bright red blood on the toilet paper on occasion in the past.  He does report getting small boils on his buttocks in association with his diabetes that sometimes will rupture and cause bleeding.  Overall has regular bowel movements.    Past Medical History:  Diagnosis Date  . 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 Bipolar affective disorder (HCC)   . DM w/o Complication Type II 07/05/2007  . HYPERTRIGLYCERIDEMIA 10/14/2010  . Nocturia 07/04/2010  . PILAR CYST 08/03/2007   Cyst in groin-states comes up when blood sugar gets high. Recurs if cannot walk for a week.     . TOBACCO ABUSE, HX OF 07/31/2009   Past Surgical History:  Procedure Laterality Date  . pilar cystectomy      reports that he quit smoking about 11 years ago. His smoking use included cigarettes. He has a 25.00 pack-year smoking history. he has never used smokeless tobacco. He reports that he does not drink alcohol or use drugs. family history includes Diabetes in his father and mother; Melanoma in his maternal uncle. Allergies  Allergen Reactions  . Divalproex Sodium Other (See Comments)    unknown  . Methylphenidate Hcl     Aggravate bipolar  . Oxycodone Hcl     REACTION: hallucinations  . Paroxetine     Aggravate bipolar disorder        Outpatient Encounter Medications as of 12/25/2017  Medication Sig  . ciclopirox (PENLAC) 8 % solution 1 ML APPLY TO NAILS DAILY APPLY ONCE DAILY TO AFFECTED NAILS  . clotrimazole-betamethasone (LOTRISONE) cream Apply 1 application topically 2 (two) times daily. For 10 days  . fenofibrate (TRICOR) 145 MG tablet TAKE 1 TABLET BY MOUTH DAILY  . glucose blood (FREESTYLE LITE) test strip Use 5x a day  . hydrocortisone (ANUSOL-HC) 25 MG suppository Place 1 suppository (25 mg total) rectally 2 (two) times daily.  . insulin aspart (NOVOLOG FLEXPEN) 100 UNIT/ML FlexPen Inject under skin 15-20 units 2x a day before meals  . Insulin Glargine (LANTUS SOLOSTAR) 100 UNIT/ML Solostar Pen Inject 20 Units into the skin daily at 10 pm.  . Insulin Pen Needle 32G X 4 MM MISC Use three times a day for insulin pens  . ipratropium (ATROVENT) 0.03 % nasal spray Place 2 sprays into both nostrils 3 (three) times daily with meals.  Marland Kitchen. lithium 300 MG capsule Take by mouth. 1 every morning , 4 every evening  . metFORMIN (GLUCOPHAGE-XR) 500 MG 24 hr tablet Take 4 tablets (2,000 mg total) by mouth daily with supper.  . propranolol (INDERAL) 40 MG tablet   . SAPHRIS 10 MG SUBL Take 10 mg at bedtime by mouth. Reported on 03/27/2016  . Sulfacetamide Sodium-Sulfur (SULFACLEANSE 8/4) 8-4 % SUSP Apply topically.  . triamcinolone (KENALOG) 0.025 % cream APPLY TO  AFFECTED AREA TWICE A DAY   No facility-administered encounter medications on file as of 12/25/2017.      REVIEW OF SYSTEMS  : All other systems reviewed and negative except where noted in the History of Present Illness.   PHYSICAL EXAM: BP 118/70   Pulse 62   Ht 5\' 10"  (1.778 m)   Wt 203 lb (92.1 kg)   BMI 29.13 kg/m  General: Well developed white male in no acute distress Head: Normocephalic and atraumatic Eyes:  Sclerae anicteric, conjunctiva pink. Ears: Normal auditory acuity Lungs: Clear throughout to auscultation; no increased WOB. Heart: Regular rate and  rhythm; no M/R/G. Abdomen: Soft, non-distended.  BS present.  Non-tender. Rectal:  Perianal irritation was noted.  DRE did not reveal any masses.  Light brown stool on exam glove.  Anoscopy revealed medium sized internal hemorrhoids. Musculoskeletal: Symmetrical with no gross deformities  Skin: No lesions on visible extremities Extremities: No edema  Neurological: Alert oriented x 4, grossly non-focal Psychological:  Alert and cooperative. Normal mood and affect  ASSESSMENT AND PLAN: *Internal hemorrhoids:  Can use hydrocortisone suppositories prn.  Could consider banding it they continue to be bothersome. *Perianal irritation:  I think that this is what is causing his symptoms of burning and itching.  This is likely due to over-aggressive hygiene.  Will try applying zinc oxide cream to the perianal area at bedtime.  Discussed avoiding over-aggressive hygiene and do not use wet wipes on the area.  ? Trial of nystatin cream if this continues.   CC:  Shelva Majestic, MD

## 2017-12-27 ENCOUNTER — Encounter: Payer: Self-pay | Admitting: Internal Medicine

## 2017-12-30 ENCOUNTER — Encounter: Payer: Self-pay | Admitting: Gastroenterology

## 2017-12-31 ENCOUNTER — Encounter: Payer: Self-pay | Admitting: Gastroenterology

## 2017-12-31 DIAGNOSIS — L290 Pruritus ani: Secondary | ICD-10-CM | POA: Insufficient documentation

## 2017-12-31 DIAGNOSIS — K648 Other hemorrhoids: Secondary | ICD-10-CM | POA: Insufficient documentation

## 2017-12-31 NOTE — Progress Notes (Signed)
I agree with the above note, plan 

## 2018-01-20 ENCOUNTER — Encounter: Payer: Self-pay | Admitting: Gastroenterology

## 2018-01-20 ENCOUNTER — Encounter: Payer: Self-pay | Admitting: Internal Medicine

## 2018-01-21 NOTE — Telephone Encounter (Signed)
I did not necessarily say that it was eczema.  It was just perianal irritation.  Has the Desitin helped?  He should avoid over aggressive hygiene as we discussed.  Could treat with some nystatin cream to see if that helps in case it is a yeast type of rash.  He is diabetic so that could be the case.  If we would like to they that then please send some nystatin cream to be used in the perianal area BID.  Thank you,  Jess

## 2018-01-22 ENCOUNTER — Other Ambulatory Visit: Payer: Self-pay

## 2018-01-22 MED ORDER — NYSTATIN-TRIAMCINOLONE 100000-0.1 UNIT/GM-% EX OINT: 1.0000 "application " | TOPICAL_OINTMENT | Freq: Two times a day (BID) | CUTANEOUS | 0 refills | Status: DC

## 2018-02-19 ENCOUNTER — Encounter: Payer: Self-pay | Admitting: Gastroenterology

## 2018-02-26 ENCOUNTER — Other Ambulatory Visit: Payer: Self-pay | Admitting: Internal Medicine

## 2018-02-26 ENCOUNTER — Encounter: Payer: Self-pay | Admitting: Internal Medicine

## 2018-02-26 DIAGNOSIS — E1165 Type 2 diabetes mellitus with hyperglycemia: Secondary | ICD-10-CM

## 2018-02-26 MED ORDER — INSULIN GLARGINE 100 UNIT/ML SOLOSTAR PEN: 20.0000 [IU] | PEN_INJECTOR | Freq: Every day | SUBCUTANEOUS | 3 refills | Status: DC

## 2018-03-08 ENCOUNTER — Other Ambulatory Visit: Payer: Self-pay | Admitting: Internal Medicine

## 2018-03-16 ENCOUNTER — Encounter: Payer: Self-pay | Admitting: Gastroenterology

## 2018-04-02 ENCOUNTER — Encounter: Payer: Self-pay | Admitting: Internal Medicine

## 2018-04-02 ENCOUNTER — Ambulatory Visit: Payer: BLUE CROSS/BLUE SHIELD | Admitting: Internal Medicine

## 2018-04-02 ENCOUNTER — Other Ambulatory Visit: Payer: Self-pay | Admitting: Internal Medicine

## 2018-04-02 VITALS — BP 120/88 | HR 62 | Ht 70.0 in | Wt 206.4 lb

## 2018-04-02 DIAGNOSIS — E1165 Type 2 diabetes mellitus with hyperglycemia: Secondary | ICD-10-CM

## 2018-04-02 DIAGNOSIS — E781 Pure hyperglyceridemia: Secondary | ICD-10-CM

## 2018-04-02 LAB — LIPID PANEL
Cholesterol: 197 mg/dL (ref 0–200)
HDL: 32.7 mg/dL — AB (ref >39.00)
NONHDL: 164.51
Total CHOL/HDL Ratio: 6
Triglycerides: 341 mg/dL — ABNORMAL HIGH (ref 0.0–149.0)
VLDL: 68.2 mg/dL — AB (ref 0.0–40.0)

## 2018-04-02 LAB — LDL CHOLESTEROL, DIRECT: LDL DIRECT: 120 mg/dL

## 2018-04-02 LAB — POCT GLYCOSYLATED HEMOGLOBIN (HGB A1C): HEMOGLOBIN A1C: 8.1 % — AB (ref 4.0–5.6)

## 2018-04-02 LAB — MICROALBUMIN / CREATININE URINE RATIO
CREATININE, U: 245.8 mg/dL
MICROALB/CREAT RATIO: 26.7 mg/g (ref 0.0–30.0)
Microalb, Ur: 65.5 mg/dL — ABNORMAL HIGH (ref 0.0–1.9)

## 2018-04-02 MED ORDER — EMPAGLIFLOZIN 10 MG PO TABS: 10.0000 mg | ORAL_TABLET | Freq: Every day | ORAL | 5 refills | Status: DC

## 2018-04-02 NOTE — Patient Instructions (Addendum)
Please continue: - Metformin 1000 mg 2x a day with meals - Novolog as follows before lunch and dinner: 20-35 units  Please increase:  - Lantus to 35 units at bedtime  Please start: - Jardiance 10 mg daily before 1st meal of the day If you tolerate this well, increase to 20 mg daily.  Please return in 3 months with your sugar log.

## 2018-04-02 NOTE — Progress Notes (Signed)
Patient ID: Cody Short, male   DOB: 1976/01/29, 42 y.o.   MRN: [id number]  HPI: Cody Short is a 42 y.o.-year-old male, returning for follow-up for DM 2, dx in ~2010, insulin-dependent, uncontrolled, without long term complications. Last visit 4 months ago.  Last hemoglobin A1c was: Lab Results  Component Value Date   HGBA1C 7.4 12/03/2017   HGBA1C 7.1 09/01/2017   HGBA1C 7.1 09/01/2017   Pt is on a regimen of: - Metformin 1000 mg to 2x a day - Lantus 20-35 units at bedtime - Novolog as follows before lunch and dinner: 20-35 units   He tried Glipizide >> hypoglycemia in the 40s repeatedly. Lowest: 25. Also, Glipizide 2.5 mg in am >> nausea, vomiting, constipation We tried Invokana >> bothersome urination (when he was working) >> had to stop. He had pancreatitis 2/2 Depakote and HTG in the past. He stopped Actos b/c stomach pain.  We stopped Cycloset >> inefficient, could not use b/c price.  Pt checks his sugars 2-3 x a day: - am: 112-156 >> 122-149, 182 >> 131-174, 181 - 2h after b'fast:144, 232-262 >> n/c - before dinner: 156-327 >> 100 >> n/c >> 204 >> n/c - 2h after dinner: 115-247, 281 >> 91, 126-206 >> 95-271 - bedtime:  102-157, 193 >> 87-136 >> 114-224 - nighttime: n/c >> 91-136 >> 139 >> 111-270, 309 Lowest sugar was 87 >> 111; he has hypoglycemia awareness in the 70s. Highest sugar was 278 >> 309.  Glucometer: FreeStyle  -No CKD, last BUN/creatinine:  Lab Results  Component Value Date   BUN 14 02/10/2017   CREATININE 1.16 02/10/2017   -+ H hypertriglyceridemia;  last set of lipids: Lab Results  Component Value Date   CHOL 217 (H) 02/10/2017   HDL 34.00 (L) 02/10/2017   LDLCALC 67 02/02/2014   LDLDIRECT 113.0 02/10/2017   TRIG (H) 02/10/2017    711.0 Triglyceride is over 400; calculations on Lipids are invalid.   CHOLHDL 6 02/10/2017  On fenofibrate. - last eye exam was on 03/2017: No DR - + numbness; no tingling in his feet. He has  recurrent furunculosis - groin.  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  ROS: Constitutional: no weight gain/no weight loss, no fatigue, no subjective hyperthermia, no subjective hypothermia Eyes: no blurry vision, no xerophthalmia ENT: no sore throat, no nodules palpated in throat, no dysphagia, no odynophagia, no hoarseness Cardiovascular: no CP/no SOB/no palpitations/no leg swelling Respiratory: no cough/no SOB/no wheezing Gastrointestinal: no N/no V/no D/no C/no acid reflux Musculoskeletal: no muscle aches/no joint aches Skin: no rashes, no hair loss Neurological: no tremors/+ numbness/no tingling/no dizziness  I reviewed pt's medications, allergies, PMH, social hx, family hx, and changes were documented in the history of present illness. Otherwise, unchanged from my initial visit note. Started Loxapine. Will stop Saphris.   Past Medical History:  Diagnosis Date  . 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 Bipolar affective disorder (HCC)   . DM w/o Complication Type II 07/05/2007  . HYPERTRIGLYCERIDEMIA 10/14/2010  . Nocturia 07/04/2010  . PILAR CYST 08/03/2007   Cyst in groin-states comes up when blood sugar gets high. Recurs if cannot walk for a week.     . TOBACCO ABUSE, HX OF 07/31/2009   Past Surgical History:  Procedure Laterality Date  . pilar cystectomy     History   Social History  . Marital Status: Single    Spouse Name: N/A  . Number of Children: 0   Occupational History  .  Social History Main Topics  . Smoking status: Former Smoker - quit in 2010  . Smokeless tobacco: Never Used  . Alcohol Use: No     Comment: none at all  . Drug Use: No   Current Outpatient Prescriptions on File Prior to Visit  Medication Sig Dispense Refill  . fenofibrate (TRICOR) 145 MG tablet Take 1 tablet (145 mg total) by mouth daily. 90 tablet 1  . Insulin Pen Needle 32G X 4 MM MISC Use once a day 100 each 11  . lithium 300 MG capsule Take by mouth. 1 every morning , 4 every  evening    . propranolol (INDERAL) 40 MG tablet   0  . SAPHRIS 10 MG SUBL Take 15 mg by mouth at bedtime. Reported on 03/27/2016  0  + Novolog - see HPI  No current facility-administered medications on file prior to visit.    Allergies  Allergen Reactions  . Divalproex Sodium Other (See Comments)    unknown  . Methylphenidate Hcl     Aggravate bipolar  . Oxycodone Hcl     REACTION: hallucinations  . Paroxetine     Aggravate bipolar disorder   Family History  Problem Relation Age of Onset  . Diabetes Mother   . Diabetes Father   . Melanoma Maternal Uncle   . Colon cancer Neg Hx   . Stomach cancer Neg Hx   . Pancreatitis Neg Hx   . Heart disease Neg Hx   . Kidney disease Neg Hx   . Liver disease Neg Hx    PE: BP 120/88   Pulse 62   Ht 5\' 10"  (1.778 m)   Wt 206 lb 6.4 oz (93.6 kg)   SpO2 98%   BMI 29.62 kg/m  Body mass index is 29.62 kg/m. Wt Readings from Last 3 Encounters:  04/02/18 206 lb 6.4 oz (93.6 kg)  12/25/17 203 lb (92.1 kg)  12/15/17 204 lb (92.5 kg)   Constitutional: overweight, in NAD Eyes: PERRLA, EOMI, no exophthalmos ENT: moist mucous membranes, no thyromegaly, no cervical lymphadenopathy Cardiovascular: RRR, No MRG Respiratory: CTA B Gastrointestinal: abdomen soft, NT, ND, BS+ Musculoskeletal: no deformities, strength intact in all 4 Skin: moist, warm, no rashes Neurological: no tremor with outstretched hands, DTR normal in all 4  ASSESSMENT: 1. DM2, insulin-dependent, uncontrolled, without long term complications, but with hyperglycemia - tested neg for DM1: Component     Latest Ref Rng 05/28/2015  C-Peptide     0.80 - 3.90 ng/mL 2.88  Glucose, Fasting     65 - 99 mg/dL 161120 (H)  Glutamic Acid Decarb Ab     <5 IU/mL <5  Pancreatic Islet Cell Antibody     < 5 JDF Units <5   - His diabetes is difficult to manage because of multiple intolerances: - We tried to add Invokana but he did not tolerated due to increased urination.  - We cannot  use a DPP 4 inhibitor or a GLP-1 receptor agonist due to his history of pancreatitis.  - He had to stop Actos >>  abd. Pain - we stopped Cycloset >> expensive and not effective - we tried Glipizide >> GI sxs: N/V/D  2. HTG  PLAN:  1. Patient with long-standing, uncontrolled, type 2 diabetes, with much better control in the past on a mostly vegan diet, on which he was able to come off insulin.  However, at last visit, sugars are higher after dinner so he was back on NovoLog.  Since then, he  contacted me that his sugars stayed high throughout the day so we added Lantus back. - At this visit, he is taking 20 to 35 units of Lantus daily, depending on his sugars at bedtime.  We discussed that ideally he would keep the dose is stable but will prevent post dinner hyperglycemia.  I suggested to try [place] Pepper, now that he is now working.  He had intolerance to Invokana before due to increased urination, but this happened when he was working.  [place] Pepper has a slightly weaker effect and Invokana, so I hope he can tolerate this.  I advised him to start at a low dose, 10 mg daily but to double up in several days to 20 mg daily if he tolerates it well.  He will need a BMP at next visit. - I also advised him to increase the Lantus and keep the dose stable, at 35 units at bedtime - Unfortunately, his diet is not ideal now as, since his grandmother moved in with them, they are eating a regular American diet - I advised him to: Patient Instructions  Please continue: - Metformin 1000 mg 2x a day with meals - Novolog as follows before lunch and dinner: 20-35 units  Please increase:  - Lantus to 35 units at bedtime  Please start: - Jardiance 10 mg daily before 1st meal of the day If you tolerate this well, increase to 20 mg daily.  Please return in 3 months with your sugar log.   - today, HbA1c is 8.1% (higher) - continue checking sugars at different times of the day - check 30x a day, rotating checks -  advised for yearly eye exams >> he is UTD, but is due soon - Return to clinic in 3 mo with sugar log    2. HTG - History of high triglycerides so we started fenofibrate and also suggested to go back on the vegan diet.  His sugars improved after he changed his diet and on the new medication. Unfortunately, for now, he is eating the standard American diet, including fatty foods and processed foods.  As a consequence, sugars are uncontrolled.  No side effects from fenofibrate.  He continues to exercise  At home, but we discussed about the benefits of going back to swimming (he was swimming a lot before).  He also would like to go rollerskating again. - We will check lipid panel  Office Visit on 04/02/2018  Component Date Value Ref Range Status  . Glucose, Bld 04/02/2018 166* 65 - 99 mg/dL Final   Comment: .            Fasting reference interval . For someone without known diabetes, a glucose value >125 mg/dL indicates that they may have diabetes and this should be confirmed with a follow-up test. .   . BUN 04/02/2018 12  7 - 25 mg/dL Final  . Creat 60/45/4098 1.10  0.60 - 1.35 mg/dL Final  . GFR, Est Non African American 04/02/2018 84  > OR = 60 mL/min/1.102m2 Final  . GFR, Est African American 04/02/2018 97  > OR = 60 mL/min/1.38m2 Final  . BUN/Creatinine Ratio 04/02/2018 NOT APPLICABLE  6 - 22 (calc) Final  . Sodium 04/02/2018 137  135 - 146 mmol/L Final  . Potassium 04/02/2018 4.4  3.5 - 5.3 mmol/L Final  . Chloride 04/02/2018 108  98 - 110 mmol/L Final  . CO2 04/02/2018 20  20 - 32 mmol/L Final  . Calcium 04/02/2018 10.2  8.6 - 10.3 mg/dL Final  .  Total Protein 04/02/2018 7.9  6.1 - 8.1 g/dL Final  . Albumin 16/07/9603 4.9  3.6 - 5.1 g/dL Final  . Globulin 54/06/8118 3.0  1.9 - 3.7 g/dL (calc) Final  . AG Ratio 04/02/2018 1.6  1.0 - 2.5 (calc) Final  . Total Bilirubin 04/02/2018 0.4  0.2 - 1.2 mg/dL Final  . Alkaline phosphatase (APISO) 04/02/2018 54  40 - 115 U/L Final  . AST  04/02/2018 35  10 - 40 U/L Final  . ALT 04/02/2018 53* 9 - 46 U/L Final  . Microalb, Ur 04/02/2018 65.5* 0.0 - 1.9 mg/dL Final  . Creatinine,U 14/78/2956 245.8  mg/dL Final  . Microalb Creat Ratio 04/02/2018 26.7  0.0 - 30.0 mg/g Final  . Cholesterol 04/02/2018 197  0 - 200 mg/dL Final   ATP III Classification       Desirable:  < 200 mg/dL               Borderline High:  200 - 239 mg/dL          High:  > = 213 mg/dL  . Triglycerides 04/02/2018 341.0* 0.0 - 149.0 mg/dL Final   Normal:  <086 mg/dLBorderline High:  150 - 199 mg/dL  . HDL 04/02/2018 32.70* >39.00 mg/dL Final  . VLDL 57/84/6962 68.2* 0.0 - 40.0 mg/dL Final  . Total CHOL/HDL Ratio 04/02/2018 6   Final                  Men          Women1/2 Average Risk     3.4          3.3Average Risk          5.0          4.42X Average Risk          9.6          7.13X Average Risk          15.0          11.0                      . NonHDL 04/02/2018 164.51   Final   NOTE:  Non-HDL goal should be 30 mg/dL higher than patient's LDL goal (i.e. LDL goal of < 70 mg/dL, would have non-HDL goal of < 100 mg/dL)  . Hemoglobin A1C 04/02/2018 8.1* 4.0 - 5.6 % Final  . Direct LDL 04/02/2018 120.0  mg/dL Final   Optimal:  <952 mg/dLNear or Above Optimal:  100-129 mg/dLBorderline High:  130-159 mg/dLHigh:  160-189 mg/dLVery High:  >190 mg/dL   Triglycerides improved since last visit, but still elevated.  Improving his diabetes further will help.  If still elevated at next check, we may need to add fish oil vs. a statin.  Carlus Pavlov, MD PhD [facility]

## 2018-04-03 LAB — COMPLETE METABOLIC PANEL WITH GFR
AG Ratio: 1.6 (calc) (ref 1.0–2.5)
ALT: 53 U/L — AB (ref 9–46)
AST: 35 U/L (ref 10–40)
Albumin: 4.9 g/dL (ref 3.6–5.1)
Alkaline phosphatase (APISO): 54 U/L (ref 40–115)
BUN: 12 mg/dL (ref 7–25)
CALCIUM: 10.2 mg/dL (ref 8.6–10.3)
CHLORIDE: 108 mmol/L (ref 98–110)
CO2: 20 mmol/L (ref 20–32)
Creat: 1.1 mg/dL (ref 0.60–1.35)
GFR, EST AFRICAN AMERICAN: 97 mL/min/{1.73_m2} (ref >=60)
GFR, EST NON AFRICAN AMERICAN: 84 mL/min/{1.73_m2} (ref >=60)
GLUCOSE: 166 mg/dL — AB (ref 65–99)
Globulin: 3 g/dL (calc) (ref 1.9–3.7)
Potassium: 4.4 mmol/L (ref 3.5–5.3)
Sodium: 137 mmol/L (ref 135–146)
TOTAL PROTEIN: 7.9 g/dL (ref 6.1–8.1)
Total Bilirubin: 0.4 mg/dL (ref 0.2–1.2)

## 2018-04-05 ENCOUNTER — Other Ambulatory Visit: Payer: Self-pay

## 2018-04-05 DIAGNOSIS — E1165 Type 2 diabetes mellitus with hyperglycemia: Secondary | ICD-10-CM

## 2018-04-05 MED ORDER — LANTUS SOLOSTAR 100 UNIT/ML ~~LOC~~ SOPN: 35.0000 [IU] | PEN_INJECTOR | Freq: Every day | SUBCUTANEOUS | 3 refills | Status: DC

## 2018-04-05 MED ORDER — LANTUS SOLOSTAR 100 UNIT/ML ~~LOC~~ SOPN: 35.0000 [IU] | PEN_INJECTOR | Freq: Every day | SUBCUTANEOUS | 0 refills | Status: DC

## 2018-04-09 ENCOUNTER — Encounter: Payer: Self-pay | Admitting: Internal Medicine

## 2018-04-10 ENCOUNTER — Encounter: Payer: Self-pay | Admitting: Internal Medicine

## 2018-04-13 ENCOUNTER — Encounter: Payer: Self-pay | Admitting: Internal Medicine

## 2018-04-13 ENCOUNTER — Encounter: Payer: Self-pay | Admitting: Family Medicine

## 2018-04-13 ENCOUNTER — Ambulatory Visit: Payer: BLUE CROSS/BLUE SHIELD | Admitting: Family Medicine

## 2018-04-13 DIAGNOSIS — L709 Acne, unspecified: Secondary | ICD-10-CM

## 2018-04-13 DIAGNOSIS — J310 Chronic rhinitis: Secondary | ICD-10-CM

## 2018-04-13 DIAGNOSIS — R945 Abnormal results of liver function studies: Secondary | ICD-10-CM | POA: Diagnosis not present

## 2018-04-13 DIAGNOSIS — R7989 Other specified abnormal findings of blood chemistry: Secondary | ICD-10-CM | POA: Insufficient documentation

## 2018-04-13 NOTE — Assessment & Plan Note (Signed)
S: Ipratropium nasal spray did not help with gustatory rhinitis.  A/P: discussed trial of zyrtec in the morning if not too sleepy- but if he cannot tolerate it take before bed as alternate to the nasal spray. culd refer to ENT if this doesn't helphim.

## 2018-04-13 NOTE — Patient Instructions (Addendum)
Health Maintenance Due  Topic Date Due  . FOOT EXAM - Today at office visit  02/15/2016  . OPHTHALMOLOGY EXAM - Patient will schedule an appointment.  08/19/2016   For runny nose after meals 1. discussed trial of zyrtec in the morning if not too sleepy- but if he cannot tolerate it take before bed as alternate to the nasal spray 2. Try to cut down on spicy foods  Blood pressure looks much better  Glad the groin is better- if you begin to have recurrent symptoms despite good diabetes control we have some options for treatment  I want you to focus on healthy eating, continued regular exercise, and focus on at least 5-10 lbs off in next few months- we can repeat liver tests at that time.   Schedule a physical in about 3 months before you leave

## 2018-04-13 NOTE — Progress Notes (Signed)
Subjective:  Cody Short is a 42 y.o. year old very pleasant male patient who presents for/with See problem oriented charting ROS- runny nose after eating.  No recent acne or boils in groin.  Does have a bump behind right ear.  No chest pain or shortness of breath  Past Medical History-  Patient Active Problem List   Diagnosis Date Noted  . Type 2 diabetes mellitus with hyperglycemia, without long-term current use of insulin (HCC) 02/11/2016    Priority: High  . Bipolar depression (HCC) 03/23/2013    Priority: High  . Elevated LFTs 04/13/2018    Priority: Medium  . Asthma 04/25/2013    Priority: Medium  . HYPERTRIGLYCERIDEMIA 10/14/2010    Priority: Medium  . Acne 07/04/2010    Priority: Medium  . Internal hemorrhoids 12/31/2017    Priority: Low  . Perianal irritation 12/31/2017    Priority: Low  . 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 Former smoker 06/27/2015    Priority: Low  . Rectal bleeding 08/22/2014    Priority: Low  . Hx of pancreatitis 02/07/2014    Priority: Low    Medications- reviewed and updated Current Outpatient Medications  Medication Sig Dispense Refill  . ciclopirox (PENLAC) 8 % solution 1 ML APPLY TO NAILS DAILY APPLY ONCE DAILY TO AFFECTED NAILS  2  . clotrimazole-betamethasone (LOTRISONE) cream Apply 1 application topically 2 (two) times daily. For 10 days 45 g 0  . empagliflozin (JARDIANCE) 10 MG TABS tablet Take 10 mg by mouth daily. 60 tablet 5  . fenofibrate (TRICOR) 145 MG tablet TAKE 1 TABLET BY MOUTH DAILY 90 tablet 1  . glucose blood (FREESTYLE LITE) test strip Use 5x a day 500 each 3  . hydrocortisone (ANUSOL-HC) 2.5 % rectal cream Place 1 application rectally at bedtime as needed for hemorrhoids or anal itching. 30 g 0  . hydrocortisone (ANUSOL-HC) 25 MG suppository Place 1 suppository (25 mg total) rectally 2 (two) times daily. 12 suppository 0  . insulin aspart (NOVOLOG FLEXPEN) 100 UNIT/ML FlexPen Inject 20-35 units  before lunch and dinner 25 pen 0  . Insulin Pen Needle 32G X 4 MM MISC Use three times a day for insulin pens 300 each 11  . ipratropium (ATROVENT) 0.03 % nasal spray Place 2 sprays into both nostrils 3 (three) times daily with meals. 30 mL 11  . LANTUS SOLOSTAR 100 UNIT/ML Solostar Pen Inject 35 Units into the skin daily at 10 pm. 15 pen 0  . lithium 300 MG capsule Take by mouth. 1 every morning , 4 every evening    . loxapine (LOXITANE) 25 MG capsule TAKE 1-2 CAPSULES BY MOUTH AT BEDTIME  1  . metFORMIN (GLUCOPHAGE-XR) 500 MG 24 hr tablet TAKE 4 TABLETS( 2000 MG TOTAL) BY MOUTH DAILY WITH SUPPER 360 tablet 0  . nystatin-triamcinolone ointment (MYCOLOG) Apply 1 application topically 2 (two) times daily. 30 g 0  . propranolol (INDERAL) 40 MG tablet   0  . SAPHRIS 10 MG SUBL Take 10 mg at bedtime by mouth. Reported on 03/27/2016  0  . Sulfacetamide Sodium-Sulfur (SULFACLEANSE 8/4) 8-4 % SUSP Apply topically.    . triamcinolone (KENALOG) 0.025 % cream APPLY TO AFFECTED AREA TWICE A DAY  2   No current facility-administered medications for this visit.     Objective: BP 124/68 (BP Location: Left Arm, Patient Position: Sitting, Cuff Size: Normal)   Pulse (!) 59   Temp 98.7 F (37.1 C) (Oral)   Ht  5\' 10"  (1.778 m)   Wt 211 lb (95.7 kg)   SpO2 97%   BMI 30.28 kg/m  Gen: NAD, resting comfortably Small abscess behind right ear- able to get to drain with pressure. Patient had relief of pressure after this CV: RRR  Lungs: nonlabored, normal respiratory rate Abdomen: soft/nontender/nondistended/overweight Ext: no edema Skin: warm, dry  Diabetic Foot Exam - Simple   Simple Foot Form Diabetic Foot exam was performed with the following findings:  Yes 04/13/2018  3:36 PM  Visual Inspection No deformities, no ulcerations, no other skin breakdown bilaterally:  Yes Sensation Testing Intact to touch and monofilament testing bilaterally:  Yes Pulse Check Posterior Tibialis and Dorsalis pulse intact  bilaterally:  Yes Comments    Assessment/Plan:  Other notes: 1. Unfortunately weight up 7 lbs from that time. Appetite increase on new bipolar medication. Luckily, BP controlled on evaluation today- last visit was up slightly.   Gustatory rhinitis S: Ipratropium nasal spray did not help with gustatory rhinitis.  A/P: discussed trial of zyrtec in the morning if not too sleepy- but if he cannot tolerate it take before bed as alternate to the nasal spray. culd refer to ENT if this doesn't helphim.   Elevated LFTs S: mild LFT elevations note dby Dr. Elvera Lennox  Lab Results  Component Value Date   ALT 53 (H) 04/02/2018   AST 35 04/02/2018   ALKPHOS 54 02/10/2017   BILITOT 0.4 04/02/2018  A/P: discusssed fatty liver as possibility. Fenofibrate can also cause elevations- if got over 3x ULN would discontinue. Would repeat in 6-12 months at latest  Acne S: gets patches of pimples/small boils in groin- last time was over a year ago- was worse when sugars were worse- has been better recently. We discussed with groin symptoms possibility of hidradenitis supportiva last visit as sounded like active groin and axilla lesions- he updates me today that has not had recent issues.   Also has seen derm in past for back acne A/P: glad these areas are doing better. Continue to monitor.   Future Appointments  Date Time Provider Department Center  06/18/2018  2:15 PM Shelva Majestic, MD [facility]  07/29/2018  3:15 PM Carlus Pavlov, MD [facility] None  get labs at CPE  Return precautions advised.  Tana Conch, MD

## 2018-04-13 NOTE — Assessment & Plan Note (Signed)
S: gets patches of pimples/small boils in groin- last time was over a year ago- was worse when sugars were worse- has been better recently. We discussed with groin symptoms possibility of hidradenitis supportiva last visit as sounded like active groin and axilla lesions- he updates me today that has not had recent issues.   Also has seen derm in past for back acne A/P: glad these areas are doing better. Continue to monitor.

## 2018-04-13 NOTE — Assessment & Plan Note (Signed)
S: mild LFT elevations note dby Dr. Elvera LennoxGherghe  Lab Results  Component Value Date   ALT 53 (H) 04/02/2018   AST 35 04/02/2018   ALKPHOS 54 02/10/2017   BILITOT 0.4 04/02/2018  A/P: discusssed fatty liver as possibility. Fenofibrate can also cause elevations- if got over 3x ULN would discontinue. Would repeat in 6-12 months at latest

## 2018-04-26 ENCOUNTER — Other Ambulatory Visit: Payer: Self-pay | Admitting: Family Medicine

## 2018-05-08 ENCOUNTER — Encounter: Payer: Self-pay | Admitting: Family Medicine

## 2018-05-11 ENCOUNTER — Encounter: Payer: Self-pay | Admitting: Internal Medicine

## 2018-05-14 ENCOUNTER — Other Ambulatory Visit: Payer: Self-pay | Admitting: Internal Medicine

## 2018-05-14 ENCOUNTER — Encounter: Payer: Self-pay | Admitting: Internal Medicine

## 2018-05-14 DIAGNOSIS — E1165 Type 2 diabetes mellitus with hyperglycemia: Secondary | ICD-10-CM

## 2018-05-15 ENCOUNTER — Other Ambulatory Visit: Payer: Self-pay | Admitting: Internal Medicine

## 2018-05-15 DIAGNOSIS — E1165 Type 2 diabetes mellitus with hyperglycemia: Secondary | ICD-10-CM

## 2018-05-17 ENCOUNTER — Other Ambulatory Visit: Payer: Self-pay

## 2018-05-17 ENCOUNTER — Encounter: Payer: Self-pay | Admitting: Internal Medicine

## 2018-05-17 DIAGNOSIS — E1165 Type 2 diabetes mellitus with hyperglycemia: Secondary | ICD-10-CM

## 2018-05-17 MED ORDER — EMPAGLIFLOZIN 10 MG PO TABS: 20.0000 mg | ORAL_TABLET | Freq: Every day | ORAL | 0 refills | Status: DC

## 2018-05-17 MED ORDER — EMPAGLIFLOZIN 10 MG PO TABS: 10.0000 mg | ORAL_TABLET | Freq: Every day | ORAL | 0 refills | Status: DC

## 2018-05-18 ENCOUNTER — Encounter: Payer: Self-pay | Admitting: Internal Medicine

## 2018-05-23 ENCOUNTER — Encounter: Payer: Self-pay | Admitting: Internal Medicine

## 2018-05-24 ENCOUNTER — Other Ambulatory Visit: Payer: Self-pay | Admitting: Internal Medicine

## 2018-06-18 ENCOUNTER — Encounter: Payer: Self-pay | Admitting: Family Medicine

## 2018-06-18 ENCOUNTER — Ambulatory Visit (INDEPENDENT_AMBULATORY_CARE_PROVIDER_SITE_OTHER): Payer: BLUE CROSS/BLUE SHIELD | Admitting: Family Medicine

## 2018-06-18 VITALS — BP 104/74 | HR 68 | Temp 98.5°F | Ht 69.25 in | Wt 206.2 lb

## 2018-06-18 DIAGNOSIS — Z0001 Encounter for general adult medical examination with abnormal findings: Secondary | ICD-10-CM | POA: Diagnosis not present

## 2018-06-18 DIAGNOSIS — Z23 Encounter for immunization: Secondary | ICD-10-CM | POA: Diagnosis not present

## 2018-06-18 DIAGNOSIS — R740 Nonspecific elevation of levels of transaminase and lactic acid dehydrogenase [LDH]: Secondary | ICD-10-CM | POA: Diagnosis not present

## 2018-06-18 DIAGNOSIS — R519 Headache, unspecified: Secondary | ICD-10-CM

## 2018-06-18 DIAGNOSIS — R51 Headache: Secondary | ICD-10-CM

## 2018-06-18 DIAGNOSIS — R7401 Elevation of levels of liver transaminase levels: Secondary | ICD-10-CM

## 2018-06-18 DIAGNOSIS — Z0000 Encounter for general adult medical examination without abnormal findings: Secondary | ICD-10-CM

## 2018-06-18 LAB — COMPREHENSIVE METABOLIC PANEL
AG Ratio: 1.5 (calc) (ref 1.0–2.5)
ALKALINE PHOSPHATASE (APISO): 55 U/L (ref 40–115)
ALT: 24 U/L (ref 9–46)
AST: 26 U/L (ref 10–40)
Albumin: 5 g/dL (ref 3.6–5.1)
BILIRUBIN TOTAL: 0.4 mg/dL (ref 0.2–1.2)
BUN: 14 mg/dL (ref 7–25)
CO2: 19 mmol/L — ABNORMAL LOW (ref 20–32)
Calcium: 10.3 mg/dL (ref 8.6–10.3)
Chloride: 107 mmol/L (ref 98–110)
Creat: 1.24 mg/dL (ref 0.60–1.35)
Globulin: 3.3 g/dL (calc) (ref 1.9–3.7)
Glucose, Bld: 129 mg/dL — ABNORMAL HIGH (ref 65–99)
Potassium: 4.5 mmol/L (ref 3.5–5.3)
SODIUM: 140 mmol/L (ref 135–146)
TOTAL PROTEIN: 8.3 g/dL — AB (ref 6.1–8.1)

## 2018-06-18 NOTE — Patient Instructions (Addendum)
Health Maintenance Due  Topic Date Due  . OPHTHALMOLOGY EXAM - glad you are planning to get this done = please have them send us a copy of results 08/19/2016  . INFLUENZA VACCINE - flu shot today 05/27/2018   We will call you within two weeks about your referral to mri brain. If you do not hear within 3 weeks, give us a call.   See us yearly for physical or sooner if new or worsening symptoms considering your headaches  Please stop by lab before you go to recheck liver

## 2018-06-18 NOTE — Addendum Note (Signed)
Addended by: Johnella MoloneyFUNDERBURK, Olajuwon Fosdick A on: 06/18/2018 03:07 PM   Modules accepted: Orders

## 2018-06-18 NOTE — Addendum Note (Signed)
Addended by: London SheerFRIZZELL, Wright Gravely T on: 06/18/2018 03:02 PM   Modules accepted: Orders

## 2018-06-18 NOTE — Addendum Note (Signed)
Addended by: London SheerFRIZZELL, Shamanda Len T on: 06/18/2018 03:01 PM   Modules accepted: Orders

## 2018-06-18 NOTE — Progress Notes (Signed)
Phone: [phone]  Subjective:  Patient presents today for their annual physical. Chief complaint-noted.   See problem oriented charting- ROS- full  review of systems was completed and negative except for: constipation on saphris, headache recently  The following were reviewed and entered/updated in epic: Past Medical History:  Diagnosis Date  . 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 Bipolar affective disorder (HCC)   . DM w/o Complication Type II 07/05/2007  . HYPERTRIGLYCERIDEMIA 10/14/2010  . Nocturia 07/04/2010  . PILAR CYST 08/03/2007   Cyst in groin-states comes up when blood sugar gets high. Recurs if cannot walk for a week.     . TOBACCO ABUSE, HX OF 07/31/2009   Patient Active Problem List   Diagnosis Date Noted  . New onset headache 06/18/2018    Priority: High  . Type 2 diabetes mellitus with hyperglycemia, without long-term current use of insulin (HCC) 02/11/2016    Priority: High  . Bipolar depression (HCC) 03/23/2013    Priority: High  . Elevated LFTs 04/13/2018    Priority: Medium  . Asthma 04/25/2013    Priority: Medium  . HYPERTRIGLYCERIDEMIA 10/14/2010    Priority: Medium  . Acne 07/04/2010    Priority: Medium  . Internal hemorrhoids 12/31/2017    Priority: Low  . Perianal irritation 12/31/2017    Priority: Low  . 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 Former smoker 06/27/2015    Priority: Low  . Rectal bleeding 08/22/2014    Priority: Low  . Hx of pancreatitis 02/07/2014    Priority: Low   Past Surgical History:  Procedure Laterality Date  . pilar cystectomy      Family History  Problem Relation Age of Onset  . Diabetes Mother   . Breast cancer Mother        was told not hereditary  . Cirrhosis Mother        fatty liver  . Diabetes Father   . Melanoma Maternal Uncle   . Colon cancer Neg Hx   . Stomach cancer Neg Hx   . Pancreatitis Neg Hx   . Heart disease Neg Hx   . Kidney disease Neg Hx   . Liver disease Neg Hx      Medications- reviewed and updated Current Outpatient Medications  Medication Sig Dispense Refill  . ciclopirox (PENLAC) 8 % solution 1 ML APPLY TO NAILS DAILY APPLY ONCE DAILY TO AFFECTED NAILS  2  . clotrimazole-betamethasone (LOTRISONE) cream Apply 1 application topically 2 (two) times daily. For 10 days 45 g 0  . empagliflozin (JARDIANCE) 10 MG TABS tablet Take 20 mg by mouth daily. 180 tablet 0  . fenofibrate (TRICOR) 145 MG tablet TAKE 1 TABLET BY MOUTH DAILY 90 tablet 1  . glucose blood (FREESTYLE LITE) test strip USE 5 TIMES DAILY 500 each 0  . glucose blood (FREESTYLE LITE) test strip USE 5 TIMES DAILY 500 each 0  . hydrocortisone (ANUSOL-HC) 2.5 % rectal cream Place 1 application rectally at bedtime as needed for hemorrhoids or anal itching. 30 g 0  . hydrocortisone (ANUSOL-HC) 25 MG suppository Place 1 suppository (25 mg total) rectally 2 (two) times daily. 12 suppository 0  . insulin aspart (NOVOLOG FLEXPEN) 100 UNIT/ML FlexPen Inject 20-35 units before lunch and dinner 25 pen 0  . Insulin Pen Needle (BD PEN NEEDLE NANO U/F) 32G X 4 MM MISC USE 3 TIMES A DAY FOR INSULIN PENS 270 each 0  . ipratropium (ATROVENT) 0.03 % nasal spray Place 2 sprays into both  nostrils 3 (three) times daily with meals. 30 mL 11  . LANTUS SOLOSTAR 100 UNIT/ML Solostar Pen Inject 35 Units into the skin daily at 10 pm. 15 pen 0  . lithium 300 MG capsule Take by mouth. 1 every morning , 4 every evening    . metFORMIN (GLUCOPHAGE-XR) 500 MG 24 hr tablet TAKE 4 TABLETS( 2000 MG TOTAL) BY MOUTH DAILY WITH SUPPER 360 tablet 0  . nystatin-triamcinolone ointment (MYCOLOG) Apply 1 application topically 2 (two) times daily. 30 g 0  . propranolol (INDERAL) 40 MG tablet   0  . SAPHRIS 10 MG SUBL Take 10 mg at bedtime by mouth. Reported on 03/27/2016  0  . Sulfacetamide Sodium-Sulfur (SULFACLEANSE 8/4) 8-4 % SUSP Apply topically.    . triamcinolone (KENALOG) 0.025 % cream APPLY TO AFFECTED AREA TWICE A DAY  2   No  current facility-administered medications for this visit.     Allergies-reviewed and updated Allergies  Allergen Reactions  . Divalproex Sodium Other (See Comments)    unknown  . Methylphenidate Hcl     Aggravate bipolar  . Oxycodone Hcl     REACTION: hallucinations  . Paroxetine     Aggravate bipolar disorder    Social History   Social History Narrative   Single. Not dating currently. No kids.    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[employer], for dad's company- [employer]      Hobbies: video games PC    Objective: BP 104/74 (BP Location: Left Arm, Patient Position: Sitting, Cuff Size: Normal)   Pulse 68   Temp 98.5 F (36.9 C) (Oral)   Ht 5' 9.25" (1.759 m)   Wt 206 lb 3.2 oz (93.5 kg)   SpO2 95%   BMI 30.23 kg/m  Gen: NAD, resting comfortably HEENT: Mucous membranes are moist. Oropharynx normal Neck: no thyromegaly CV: RRR no murmurs rubs or gallops Lungs: CTAB no crackles, wheeze, rhonchi Abdomen: soft/nontender/nondistended/normal bowel sounds. No rebound or guarding.  Ext: no edema Skin: warm, dry Neuro: CN II-XII intact, sensation and reflexes normal throughout, 5/5 muscle strength in bilateral upper and lower extremities. Normal finger to nose. Normal rapid alternating movements. No pronator drift. Normal romberg. Normal gait.    Assessment/Plan:  42 y.o. male presenting for annual physical.  Health Maintenance counseling: 1. Anticipatory guidance: Patient counseled regarding regular dental exams -q6 months, eye exams - trying to do yearly, wearing seatbelts.  2. Risk factor reduction:  Advised patient of need for regular exercise and diet rich and fruits and vegetables to reduce risk of heart attack and stroke. Exercise- 4-5 days a week exercising. Diet-down 5 lbs from last visit- has tried to improve what he is eating.  Wt Readings from Last 3 Encounters:  06/18/18 206 lb 3.2 oz (93.5 kg)  04/13/18 211 lb (95.7 kg)    04/02/18 206 lb 6.4 oz (93.6 kg)  3. Immunizations/screenings/ancillary studies- advised flu shot today . Diabetic eye exam - has soon, gave him our fax so they can send results Immunization History  Administered Date(s) Administered  . Influenza Split 11/27/2011, 07/19/2012  . Influenza Whole 07/29/2008, 07/26/2009, 07/22/2010  . Influenza,inj,Quad PF,6+ Mos 08/15/2013, 06/27/2015, 08/11/2016, 09/01/2017  . Influenza-Unspecified 08/31/2014  . Pneumococcal Conjugate-13 08/31/2014  . Pneumococcal Polysaccharide-23 12/24/2015  . Td 07/22/2010  4. Prostate cancer screening- no family history, start at age 455  5. Colon cancer screening -  no family history, start at age 40-50  6. Skin cancer screening/prevention- saw derm 6 months ago. advised regular sunscreen use. Denies worrisome, changing, or new skin lesions.  7. Testicular cancer screening- advised monthly self exams  8. STD screening- patient opts  out- not sexually active he says in 20 years. Denies unprotected sex since last testing.   Status of chronic or acute concerns   Diabetes- follows with Dr. Elvera Lennox- is on jardiance, metformin, insulin (lantus and novolg). Mild poor control last check Lab Results  Component Value Date   HGBA1C 8.1 (A) 04/02/2018   Bipolar- stable through Dr. Jennelle Human with lithium as well as saphris (off loxapine now). saphris causes a sensation of constipation- taking metamucil to help this.   Hypertriglyceridemia- on fenofibrate with poor control just 2 months ago. May need to consider statin at next CPE with turning 42. May be able to lower further with weight loss/better diabetes control Lab Results  Component Value Date   CHOL 197 04/02/2018   HDL 32.70 (L) 04/02/2018   LDLCALC 67 02/02/2014   LDLDIRECT 120.0 04/02/2018   TRIG 341.0 (H) 04/02/2018   CHOLHDL 6 04/02/2018   Asthma- stable without medicine. Occasionally issues at nighttime- declines inhaler  Back Acne- on bactrim through  dermatology in past- no recent rx  High ALT- could be fatty liver. Loxapine may have caused LFT elevation- off for a month. Recheck LFTs- work on weight loss  New onset headache S: headaches starting a week ago with bowel movement - was on the top frontal portion of head and some into the back up to 8/10- sharp sensation. Took aleve and it resolved in 1.5 hours. Has recurred each time he strains or has bowel movement. Takes aleve again and it resolves. If he strains outside of bowel movement also gets headaches  He states he does not get headaches regularly- they are very sparing in his life.   Does admit to some increased stress- mom has breast cancer surgery in 1 week.  A/P: I told patient that even with a normal neuro exam- I was concerned that headaches recur with valsalva/straining. I advised MR brain with and without contrast- he is willing to proceed but may check with his parents about funding   Future Appointments  Date Time Provider Department Center  07/29/2018  3:15 PM Carlus Pavlov, MD [facility] None   Lab/Order associations: Preventative health care  Elevated ALT measurement - Plan: Comprehensive metabolic panel, CANCELED: Hepatic function panel  New onset headache  Nonintractable headache, unspecified chronicity pattern, unspecified headache type - Plan: MR Brain W Wo Contrast  Return precautions advised.  Tana Conch, MD

## 2018-06-18 NOTE — Assessment & Plan Note (Addendum)
S: headaches starting a week ago with bowel movement - was on the top frontal portion of head and some into the back up to 8/10- sharp sensation. Took aleve and it resolved in 1.5 hours. Has recurred each time he strains or has bowel movement. Takes aleve again and it resolves. If he strains outside of bowel movement also gets headaches  He states he does not get headaches regularly- they are very sparing in his life.   Does admit to some increased stress- mom has breast cancer surgery in 1 week.  A/P: I told patient that even with a normal neuro exam- I was concerned that headaches recur with valsalva/straining. I advised MR brain with and without contrast- he is willing to proceed but may check with his parents about funding

## 2018-07-20 ENCOUNTER — Other Ambulatory Visit: Payer: Self-pay | Admitting: Psychiatry

## 2018-07-21 LAB — LITHIUM LEVEL: LITHIUM LVL: 0.7 mmol/L (ref 0.6–1.2)

## 2018-07-29 ENCOUNTER — Ambulatory Visit: Payer: BLUE CROSS/BLUE SHIELD | Admitting: Internal Medicine

## 2018-07-29 ENCOUNTER — Encounter: Payer: Self-pay | Admitting: Internal Medicine

## 2018-07-29 VITALS — BP 110/70 | HR 63 | Ht 69.25 in | Wt 209.0 lb

## 2018-07-29 DIAGNOSIS — E1165 Type 2 diabetes mellitus with hyperglycemia: Secondary | ICD-10-CM | POA: Diagnosis not present

## 2018-07-29 DIAGNOSIS — E669 Obesity, unspecified: Secondary | ICD-10-CM | POA: Diagnosis not present

## 2018-07-29 DIAGNOSIS — E781 Pure hyperglyceridemia: Secondary | ICD-10-CM | POA: Diagnosis not present

## 2018-07-29 MED ORDER — EMPAGLIFLOZIN 25 MG PO TABS: 25.0000 mg | ORAL_TABLET | Freq: Every day | ORAL | 3 refills | Status: DC

## 2018-07-29 MED ORDER — ACCU-CHEK MULTICLIX LANCETS MISC: 3 refills | Status: DC

## 2018-07-29 NOTE — Progress Notes (Signed)
Patient ID: Cody Short, male   DOB: 19-Feb-1976, 43 y.o.   MRN: [id number]  HPI: Cody Short is a 42 y.o.-year-old male, returning for follow-up for DM 2, dx in ~2010, insulin-dependent, uncontrolled, without long term complications. Last visit 4 months ago.  At last visit, we started Jardiance and, in the last few months, he restarted exercise and his sugars improved.  He was able to decrease the dose of Lantus.    Last hemoglobin A1c was: Lab Results  Component Value Date   HGBA1C 8.1 (A) 04/02/2018   HGBA1C 7.4 12/03/2017   HGBA1C 7.1 09/01/2017   Pt is on a regimen of: - Metformin 1000 mg to 2x a day - Lantus 10-20 units at bedtime - Novolog as follows before meals: 20-25 units - Jardiance 10 >> 20 mg in am - started 03/2018  He tried Glipizide >> hypoglycemia in the 40s repeatedly. Lowest: 25. Also, Glipizide 2.5 mg in am >> nausea, vomiting, constipation We tried Invokana >> bothersome urination (when he was working) >> had to stop. He had pancreatitis 2/2 Depakote and HTG in the past. He stopped Actos b/c stomach pain.  We stopped Cycloset >> inefficient, could not use b/c price.  Pt checks his sugars 2-3 times a day - 4-5 pm:  122-149, 182 >> 131-174, 181 >> 95-135 - 2h after b'fast:144, 232-262 >> n/c >> 80-167 - before dinner: 100 >> n/c >> 204 >> n/c >> 132 - 2h after dinner:  91, 126-206 >> 95-271 >> 72-157 - bedtime: 87-136 >> 114-224 >> 116-240 - nighttime:139 >> 111-270, 309 >> 89-181 Lowest sugar was 87 >> 111  >> 72; he has hypoglycemia awareness in the 70s. Highest sugar was 278 >> 309 >> 200s.  Glucometer: FreeStyle  -No CKD, last BUN/creatinine:  Lab Results  Component Value Date   BUN 14 06/18/2018   CREATININE 1.24 06/18/2018   -+ Mixed;  last set of lipids: Lab Results  Component Value Date   CHOL 197 04/02/2018   HDL 32.70 (L) 04/02/2018   LDLCALC 67 02/02/2014   LDLDIRECT 120.0 04/02/2018   TRIG 341.0 (H) 04/02/2018   CHOLHDL 6 04/02/2018  On fenofibrate. - last eye exam was on 03/2017: No DR -+ Numbness; no tingling in his feet. He has recurrent furunculosis - groin.  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  + rash  - scrotal - improved (almost) on Nystatin + Triamcinolone. May need refills in the future.   ROS: Constitutional: no weight gain/no weight loss, no fatigue, no subjective hyperthermia, no subjective hypothermia Eyes: no blurry vision, no xerophthalmia ENT: no sore throat, no nodules palpated in throat, no dysphagia, no odynophagia, no hoarseness Cardiovascular: no CP/no SOB/no palpitations/no leg swelling Respiratory: no cough/no SOB/no wheezing Gastrointestinal: no N/no V/no D/no C/no acid reflux Musculoskeletal: no muscle aches/no joint aches Skin: + Almost resolved rash, no hair loss Neurological: no tremors/+ numbness/+ tingling/no dizziness  I reviewed pt's medications, allergies, PMH, social hx, family hx, and changes were documented in the history of present illness. Otherwise, unchanged from my initial visit note.  Past Medical History:  Diagnosis Date  . 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 Bipolar affective disorder (HCC)   . DM w/o Complication Type II 07/05/2007  . HYPERTRIGLYCERIDEMIA 10/14/2010  . Nocturia 07/04/2010  . PILAR CYST 08/03/2007   Cyst in groin-states comes up when blood sugar gets high. Recurs if cannot walk for a week.     . TOBACCO ABUSE, HX OF 07/31/2009  Past Surgical History:  Procedure Laterality Date  . pilar cystectomy     History   Social History  . Marital Status: Single    Spouse Name: N/A  . Number of Children: 0   Occupational History  .    Social History Main Topics  . Smoking status: Former Smoker - quit in 2010  . Smokeless tobacco: Never Used  . Alcohol Use: No     Comment: none at all  . Drug Use: No   Current Outpatient Prescriptions on File Prior to Visit  Medication Sig Dispense Refill  . fenofibrate (TRICOR) 145  MG tablet Take 1 tablet (145 mg total) by mouth daily. 90 tablet 1  . Insulin Pen Needle 32G X 4 MM MISC Use once a day 100 each 11  . lithium 300 MG capsule Take by mouth. 1 every morning , 4 every evening    . propranolol (INDERAL) 40 MG tablet   0  . SAPHRIS 10 MG SUBL Take 15 mg by mouth at bedtime. Reported on 03/27/2016  0  + Novolog - see HPI  No current facility-administered medications on file prior to visit.    Allergies  Allergen Reactions  . Divalproex Sodium Other (See Comments)    unknown  . Methylphenidate Hcl     Aggravate bipolar  . Oxycodone Hcl     REACTION: hallucinations  . Paroxetine     Aggravate bipolar disorder   Family History  Problem Relation Age of Onset  . Diabetes Mother   . Breast cancer Mother        was told not hereditary  . Cirrhosis Mother        fatty liver  . Diabetes Father   . Melanoma Maternal Uncle   . Colon cancer Neg Hx   . Stomach cancer Neg Hx   . Pancreatitis Neg Hx   . Heart disease Neg Hx   . Kidney disease Neg Hx   . Liver disease Neg Hx    PE: BP 110/70   Pulse 63   Ht 5' 9.25" (1.759 m)   Wt 209 lb (94.8 kg)   SpO2 97%   BMI 30.64 kg/m  Body mass index is 30.64 kg/m. Wt Readings from Last 3 Encounters:  07/29/18 209 lb (94.8 kg)  06/18/18 206 lb 3.2 oz (93.5 kg)  04/13/18 211 lb (95.7 kg)   Constitutional: overweight, in NAD Eyes: PERRLA, EOMI, no exophthalmos ENT: moist mucous membranes, no thyromegaly, no cervical lymphadenopathy Cardiovascular: RRR, No MRG Respiratory: CTA B Gastrointestinal: abdomen soft, NT, ND, BS+ Musculoskeletal: no deformities, strength intact in all 4 Skin: moist, warm, no rashes Neurological: no tremor with outstretched hands, DTR normal in all 4  ASSESSMENT: 1. DM2, insulin-dependent, uncontrolled, without long term complications, but with hyperglycemia - tested neg for DM1: Component     Latest Ref Rng 05/28/2015  C-Peptide     0.80 - 3.90 ng/mL 2.88  Glucose, Fasting      65 - 99 mg/dL 161 (H)  Glutamic Acid Decarb Ab     <5 IU/mL <5  Pancreatic Islet Cell Antibody     < 5 JDF Units <5   - His diabetes is difficult to manage because of multiple intolerances: - We tried to add Invokana but he did not tolerated due to increased urination.  - We cannot use a DPP 4 inhibitor or a GLP-1 receptor agonist due to his history of pancreatitis.  - He had to stop Actos >>  abd.  Pain - we stopped Cycloset >> expensive and not effective - we tried Glipizide >> GI sxs: N/V/D  2. HTG  3.  Obesity class I  PLAN:  1. Patient with long-standing, uncontrolled, type 2 diabetes, with improved control since last visit after starting Jardiance and more recently after starting exercise.  He was able to decrease the Lantus dose to 10 to 20 units (he is mostly using 10) and he continues with NovoLog.  He increase Jardiance from 10 to 20 mg and he is tolerating this well.  Of note, he had increased urination on Invokana before and he could not tolerate it.  At this visit, I advised him to increase the dose of Jardiance to 25 mg and sent a prescription to his pharmacy.  He had a BMP after starting Jardiance and potassium was normal. - I am hoping that we can decrease his NovoLog dose soon  - I advised him to: Patient Instructions  Please continue: - Metformin 1000 mg 2x a day with meals - Lantus 10(-20) units at bedtime - Novolog before meals: 20(-25) units  Increase: - Jardiance to 25 mg before first meal of the day  Please return in 3-4 months with your sugar log.   - today, HbA1c is 6.6% (much better) - continue checking sugars at different times of the day - check 2-3x a day, rotating checks - advised for yearly eye exams >> he is UTD - Return to clinic in 3-4 mo with sugar log    2. HTL -He has a history of very high triglycerides of the study fenofibrate and also suggested to go back on the vegan diet.  His triglycerides improved after he changed his diet and on the  new medication but they were still high -At last visit, we also discussed about the need to start some form of exercise >> he is restarted running and I think at next check, his triglycerides will continue to improve. -We will check a lipid level at next visit  3.  Obesity class I -He lost only 2 pounds since last visit.  I am hoping that with the improvement in his exercise and especially on the higher dose of Jardiance, his weight will continue to drop  Carlus Pavlov, MD PhD [facility]ocr[facility]

## 2018-07-29 NOTE — Patient Instructions (Addendum)
Please continue: - Metformin 1000 mg 2x a day with meals - Lantus 10(-20) units at bedtime - Novolog before meals: 20(-25) units  Increase: - Jardiance to 25 mg before first meal of the day  Please return in 3-4 months with your sugar log.

## 2018-08-04 ENCOUNTER — Ambulatory Visit: Payer: BLUE CROSS/BLUE SHIELD | Admitting: Psychiatry

## 2018-08-04 DIAGNOSIS — F4001 Agoraphobia with panic disorder: Secondary | ICD-10-CM

## 2018-08-04 DIAGNOSIS — F409 Phobic anxiety disorder, unspecified: Secondary | ICD-10-CM | POA: Diagnosis not present

## 2018-08-04 DIAGNOSIS — F5113 Hypersomnia due to other mental disorder: Secondary | ICD-10-CM | POA: Diagnosis not present

## 2018-08-04 DIAGNOSIS — F1211 Cannabis abuse, in remission: Secondary | ICD-10-CM

## 2018-08-04 DIAGNOSIS — F250 Schizoaffective disorder, bipolar type: Secondary | ICD-10-CM
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
## 2018-08-08 ENCOUNTER — Other Ambulatory Visit: Payer: Self-pay | Admitting: Internal Medicine

## 2018-08-08 DIAGNOSIS — E1165 Type 2 diabetes mellitus with hyperglycemia: Secondary | ICD-10-CM

## 2018-09-07 ENCOUNTER — Encounter: Payer: Self-pay | Admitting: Internal Medicine

## 2018-09-07 ENCOUNTER — Other Ambulatory Visit: Payer: Self-pay | Admitting: Internal Medicine

## 2018-09-08 ENCOUNTER — Other Ambulatory Visit: Payer: Self-pay | Admitting: Internal Medicine

## 2018-09-11 ENCOUNTER — Other Ambulatory Visit: Payer: Self-pay | Admitting: Internal Medicine

## 2018-10-09 ENCOUNTER — Encounter: Payer: Self-pay | Admitting: Internal Medicine

## 2018-10-11 ENCOUNTER — Other Ambulatory Visit: Payer: Self-pay | Admitting: Gastroenterology

## 2018-10-11 ENCOUNTER — Other Ambulatory Visit: Payer: Self-pay

## 2018-10-11 MED ORDER — LITHIUM CARBONATE 300 MG PO CAPS: ORAL_CAPSULE | ORAL | 0 refills | Status: DC

## 2018-10-11 MED ORDER — EMPAGLIFLOZIN 25 MG PO TABS: 25.0000 mg | ORAL_TABLET | Freq: Every day | ORAL | 3 refills | Status: DC

## 2018-10-17 ENCOUNTER — Encounter: Payer: Self-pay | Admitting: Internal Medicine

## 2018-10-21 ENCOUNTER — Encounter: Payer: Self-pay | Admitting: Emergency Medicine

## 2018-10-21 ENCOUNTER — Other Ambulatory Visit: Payer: Self-pay | Admitting: Family Medicine

## 2018-10-21 DIAGNOSIS — F259 Schizoaffective disorder, unspecified: Secondary | ICD-10-CM

## 2018-11-04 ENCOUNTER — Ambulatory Visit: Payer: BLUE CROSS/BLUE SHIELD | Admitting: Psychiatry

## 2018-11-04 ENCOUNTER — Encounter: Payer: Self-pay | Admitting: Psychiatry

## 2018-11-04 VITALS — BP 147/89 | HR 65

## 2018-11-04 DIAGNOSIS — G251 Drug-induced tremor: Secondary | ICD-10-CM

## 2018-11-04 DIAGNOSIS — F250 Schizoaffective disorder, bipolar type: Secondary | ICD-10-CM

## 2018-11-04 DIAGNOSIS — F4001 Agoraphobia with panic disorder: Secondary | ICD-10-CM | POA: Diagnosis not present

## 2018-11-04 DIAGNOSIS — F1211 Cannabis abuse, in remission: Secondary | ICD-10-CM

## 2018-11-04 DIAGNOSIS — F411 Generalized anxiety disorder: Secondary | ICD-10-CM | POA: Diagnosis not present

## 2018-11-04 DIAGNOSIS — F5105 Insomnia due to other mental disorder: Secondary | ICD-10-CM

## 2018-11-04 NOTE — Progress Notes (Signed)
Cody Short [id number] 1976-01-09 42 y.o.  Subjective:   Patient ID:  Cody Short is a 43 y.o. (DOB 1976-01-09) male.  Chief Complaint:  Chief Complaint  Patient presents with  . Follow-up    Medication management    HPI last seen August 04, 2018 Cody Short presents to the office today for follow-up of several psychiatric diagnoses and is complaints of side effects with the medicines..  At the last visit we reduced the Saphris for cost and sedation reasons.   Overall done really well with moods.  No sig problems with mania with the reduction of Saphris.  No longer excessively drowsy.  Occ conflict with people but manageable.  Recognizes mistakes he's made in the past due to drugs and bipolar disorder.  Less fear of driving and doing it more.  Patient reports stable mood and denies depressed or irritable moods.  Patient reports some recent difficulty with anxiety with GM living with them and she's difficult.  Patient denies difficulty with sleep initiation or maintenance; 8-9 hours. Denies appetite disturbance.  Patient reports that energy and motivation have been good.  Patient denies any difficulty with concentration.  Patient denies any suicidal ideation.  Admits he's easily stressed.  Works with [employer]'s company.  Prior psychiatric medications include risperidone with cognitive side effects, Abilify, Depakote (Note patient had severe pancreatitis from Depakote.), Equetro, Zyprexa, Seroquel 600 mg, perphenazine, Saphris, haloperidol, N-acetylcysteine with minimal benefit, Geodon, Amotidine with vomiting. ,  Propranolol for tremor, lithium, Saphris, Vraylar.  He's prone to EPS.  Review of Systems:  Review of Systems  Neurological: Positive for tremors. Negative for weakness.  Psychiatric/Behavioral: Positive for decreased concentration. Negative for agitation, behavioral problems, confusion, dysphoric mood, hallucinations, self-injury, sleep disturbance  and suicidal ideas. The patient is nervous/anxious. The patient is not hyperactive.     Medications: I have reviewed the patient's current medications.  Current Outpatient Medications  Medication Sig Dispense Refill  . asenapine (SAPHRIS) 5 MG SUBL 24 hr tablet Place 5 mg under the tongue 2 (two) times daily. But he's taking it all at night    . ciclopirox (PENLAC) 8 % solution 1 ML APPLY TO NAILS DAILY APPLY ONCE DAILY TO AFFECTED NAILS  2  . clotrimazole-betamethasone (LOTRISONE) cream Apply 1 application topically 2 (two) times daily. For 10 days 45 g 0  . empagliflozin (JARDIANCE) 25 MG TABS tablet Take 25 mg by mouth daily. 90 tablet 3  . fenofibrate (TRICOR) 145 MG tablet TAKE 1 TABLET BY MOUTH DAILY 90 tablet 1  . glucose blood (FREESTYLE LITE) test strip USE 5 TIMES DAILY 500 each 0  . hydrocortisone (ANUSOL-HC) 2.5 % rectal cream Place 1 application rectally at bedtime as needed for hemorrhoids or anal itching. 30 g 0  . hydrocortisone (ANUSOL-HC) 25 MG suppository Place 1 suppository (25 mg total) rectally 2 (two) times daily. 12 suppository 0  . insulin aspart (NOVOLOG FLEXPEN) 100 UNIT/ML FlexPen Inject 20-35 units before lunch and dinner 25 pen 0  . Insulin Pen Needle (BD PEN NEEDLE NANO U/F) 32G X 4 MM MISC USE 3 TIMES DAILY WITH INSULIN PENS 270 each 0  . ipratropium (ATROVENT) 0.03 % nasal spray Place 2 sprays into both nostrils 3 (three) times daily with meals. 30 mL 11  . Lancets (ACCU-CHEK MULTICLIX) lancets Use 3x instructed 200 each 3  . LANTUS SOLOSTAR 100 UNIT/ML Solostar Pen Inject 35 Units into the skin daily at 10 pm. 15 pen 0  . lithium carbonate 300 MG  capsule Take 1 capsule in the morning and 3 capsules every evening 360 capsule 0  . metFORMIN (GLUCOPHAGE-XR) 500 MG 24 hr tablet TAKE 4 TABLETS( 2000 MG TOTAL) BY MOUTH DAILY WITH SUPPER 360 tablet 0  . nystatin-triamcinolone ointment (MYCOLOG) Apply to rectal area for internal hemorrhoids twice daily. 30 g 0  .  propranolol (INDERAL) 40 MG tablet   0  . Sulfacetamide Sodium-Sulfur (SULFACLEANSE 8/4) 8-4 % SUSP Apply topically.    . triamcinolone (KENALOG) 0.025 % cream APPLY TO AFFECTED AREA TWICE A DAY  2  . SAPHRIS 10 MG SUBL Take 10 mg at bedtime by mouth. Reported on 03/27/2016  0   No current facility-administered medications for this visit.     Medication Side Effects: Other: less tremor  Allergies:  Allergies  Allergen Reactions  . Divalproex Sodium Other (See Comments)    unknown  . Methylphenidate Hcl     Aggravate bipolar  . Oxycodone Hcl     REACTION: hallucinations  . Paroxetine     Aggravate bipolar disorder    Past Medical History:  Diagnosis Date  . 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 Bipolar affective disorder (HCC)   . DM w/o Complication Type II 07/05/2007  . HYPERTRIGLYCERIDEMIA 10/14/2010  . Nocturia 07/04/2010  . PILAR CYST 08/03/2007   Cyst in groin-states comes up when blood sugar gets high. Recurs if cannot walk for a week.     . TOBACCO ABUSE, HX OF 07/31/2009    Family History  Problem Relation Age of Onset  . Diabetes Mother   . Breast cancer Mother        was told not hereditary  . Cirrhosis Mother        fatty liver  . Diabetes Father   . Melanoma Maternal Uncle   . Colon cancer Neg Hx   . Stomach cancer Neg Hx   . Pancreatitis Neg Hx   . Heart disease Neg Hx   . Kidney disease Neg Hx   . Liver disease Neg Hx     Social History   Socioeconomic History  . Marital status: Single    Spouse name: Not on file  . Number of children: Not on file  . Years of education: Not on file  . Highest education level: Not on file  Occupational History  . Not on file  Social Needs  . Financial resource strain: Not on file  . Food insecurity:    Worry: Not on file    Inability: Not on file  . Transportation needs:    Medical: Not on file    Non-medical: Not on file  Tobacco Use  . Smoking status: Former Smoker    Packs/day: 2.50    Years: 10.00    Pack  years: 25.00    Types: Cigarettes    Last attempt to quit: 10/27/2006    Years since quitting: 12.0  . Smokeless tobacco: Never Used  Substance and Sexual Activity  . Alcohol use: No    Alcohol/week: 0.0 standard drinks    Comment: none at all  . Drug use: No  . Sexual activity: Not on file  Lifestyle  . Physical activity:    Days per week: Not on file    Minutes per session: Not on file  . Stress: Not on file  Relationships  . Social connections:    Talks on phone: Not on file    Gets together: Not on file    Attends religious service: Not on file  Active member of club or organization: Not on file    Attends meetings of clubs or organizations: Not on file    Relationship status: Not on file  . Intimate partner violence:    Fear of current or ex partner: Not on file    Emotionally abused: Not on file    Physically abused: Not on file    Forced sexual activity: Not on file  Other Topics Concern  . Not on file  Social History Narrative   Single. Not dating currently. No kids.    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   Please see review of systems for further details on the patient's review from today.   Objective:   Physical Exam:  BP (!) 147/89 (BP Location: Left Arm)   Pulse 65   Physical Exam Constitutional:      General: He is not in acute distress.    Appearance: He is well-developed.  Musculoskeletal:        General: No deformity.  Neurological:     Mental Status: He is alert and oriented to person, place, and time.     Motor: Tremor present.     Coordination: Coordination normal.     Gait: Gait normal.  Psychiatric:        Attention and Perception: Attention and perception normal.        Mood and Affect: Mood is not anxious or  depressed. Affect is not labile, blunt or angry.        Speech: Speech normal.        Behavior: Behavior normal.        Thought Content: Thought content normal. Thought content does not include homicidal or suicidal ideation. Thought content does not include homicidal or suicidal plan.        Cognition and Memory: Cognition normal.        Judgment: Judgment normal.     Comments: Insight intact. No auditory or visual hallucinations. No delusions.      Lab Review:     Component Value Date/Time   NA 140 06/18/2018 1502   K 4.5 06/18/2018 1502   CL 107 06/18/2018 1502   CO2 19 (L) 06/18/2018 1502   GLUCOSE 129 (H) 06/18/2018 1502   BUN 14 06/18/2018 1502   CREATININE 1.24 06/18/2018 1502   CALCIUM 10.3 06/18/2018 1502   PROT 8.3 (H) 06/18/2018 1502   ALBUMIN 4.4 02/10/2017 1631   AST 26 06/18/2018 1502   ALT 24 06/18/2018 1502   ALKPHOS 54 02/10/2017 1631   BILITOT 0.4 06/18/2018 1502   GFRNONAA 84 04/02/2018 1551   GFRAA 97 04/02/2018 1551       Component Value Date/Time   WBC 11.3 (H) 06/27/2016 1441   RBC 4.52 06/27/2016 1441   HGB 13.5 06/27/2016 1441   HCT 39.0 06/27/2016 1441   PLT 248.0 06/27/2016 1441   MCV 86.3 06/27/2016 1441   MCH 29.6 01/28/2014 1250   MCHC 34.5 06/27/2016 1441   RDW 13.2 06/27/2016 1441   LYMPHSABS 1.9 06/27/2016 1441   MONOABS 0.8 06/27/2016 1441   EOSABS 0.2 06/27/2016 1441   BASOSABS 0.1 06/27/2016 1441    Lithium Lvl  Date Value Ref Range Status  07/20/2018 0.7 0.6 - 1.2  mmol/L Final     No results found for: PHENYTOIN, PHENOBARB, VALPROATE, CBMZ   .res Assessment: Plan:    Schizoaffective disorder, bipolar type (HCC)  Generalized anxiety disorder  Panic disorder with agoraphobia  Insomnia due to mental condition  Tremor due to multiple drugs  Marijuana abuse in remission   Paul has had significant disruptive mooRenae Fickled and psychotic symptoms and substance abuse over the course of his treatment here.  It has been evident  and chronic occupational dysfunction and some relational problems.  He is also been very prone to EPS making it difficult to get adequate mood stabilization.  He has not had adequate mood stabilization from lithium alone.  Has a history of severe pancreatitis from Depakote.  At this time he is doing reasonably well after having reduced Saphris from 15 mg to 10 mg due to excessive sleepiness.  He is still sleeping adequately at this time and is not reported a significant risk increase in manic symptoms.  There is still some risk of this and he is aware.  Discussed potential metabolic side effects associated with atypical antipsychotics, as well as potential risk for movement side effects. Advised pt to contact office if movement side effects occur.   Been EPS prone in the past and tremor prone.  Propranolol is managing the tremor.  He has a low stress tolerance.  He can be easily agitated in public.  Labs acceptable.    He is continued abstinence from marijuana is encouraged.  He has a history of cannabinoid hyperemesis which finally convinced him to discontinue the marijuana.  His mood disorder and thought disorganization have improved since being off the marijuana.  He has a very long history of heavy marijuana dependence.  Follow-up 3 months  Meredith Staggersarey Cottle, MD, D FA PA  Please see After Visit Summary for patient specific instructions.  Future Appointments  Date Time Provider Department Center  11/29/2018  3:15 PM Carlus PavlovGherghe, Cristina, MD [facility] None  02/03/2019  3:15 PM Cottle, Steva Readyarey G Jr., MD [facility] None    No orders of the defined types were placed in this encounter.     -------------------------------

## 2018-11-08 ENCOUNTER — Other Ambulatory Visit: Payer: Self-pay | Admitting: Internal Medicine

## 2018-11-08 DIAGNOSIS — E1165 Type 2 diabetes mellitus with hyperglycemia: Secondary | ICD-10-CM

## 2018-11-09 ENCOUNTER — Other Ambulatory Visit: Payer: Self-pay | Admitting: Internal Medicine

## 2018-11-09 DIAGNOSIS — E1165 Type 2 diabetes mellitus with hyperglycemia: Secondary | ICD-10-CM

## 2018-11-29 ENCOUNTER — Encounter: Payer: Self-pay | Admitting: Internal Medicine

## 2018-11-29 ENCOUNTER — Ambulatory Visit: Payer: Self-pay | Admitting: Internal Medicine

## 2018-11-29 VITALS — BP 120/88 | HR 68 | Ht 69.25 in | Wt 209.0 lb

## 2018-11-29 DIAGNOSIS — E781 Pure hyperglyceridemia: Secondary | ICD-10-CM

## 2018-11-29 DIAGNOSIS — E1165 Type 2 diabetes mellitus with hyperglycemia: Secondary | ICD-10-CM

## 2018-11-29 DIAGNOSIS — E669 Obesity, unspecified: Secondary | ICD-10-CM

## 2018-11-29 LAB — POCT GLYCOSYLATED HEMOGLOBIN (HGB A1C): Hemoglobin A1C: 6.8 % — AB (ref 4.0–5.6)

## 2018-11-29 NOTE — Progress Notes (Signed)
Patient ID: Cody DerryVincent P Goldston, male   DOB: 1976-08-19, 43 y.o.   MRN: [id number]  HPI: Cody Short is a 43 y.o.-year-old male, returning for follow-up for DM 2, dx in ~2010, insulin-dependent, uncontrolled, without long term complications. Last visit 4 months ago.  Due to the change in insurance, he had to change his PCP and will see [facility].  He is not happy about this change.  Also, I am not in his network, but he plans to continue to see me, hopefully on a less frequent base.  At this visit, his sugars are much better and he feels that this is due to starting Metamucil.  He feels less hungry while on it.  Last hemoglobin A1c was: Lab Results  Component Value Date   HGBA1C 8.1 (A) 04/02/2018   HGBA1C 7.4 12/03/2017   HGBA1C 7.1 09/01/2017   Pt is on a regimen of: - Metformin 2000 mg with b'fast (4 pm)  not needed anymore - Novolog before meals: 20 units 1-2x a day - Jardiance 25 mg before first meal of the day- started 03/2018  He tried Glipizide >> hypoglycemia in the 40s repeatedly. Lowest: 25. Also, Glipizide 2.5 mg in am >> nausea, vomiting, constipation We tried Invokana >> bothersome urination (when he was working) >> had to stop. He had pancreatitis 2/2 Depakote and HTG in the past. He stopped Actos b/c stomach pain.  We stopped Cycloset >> inefficient, could not use b/c price.  Pt checks his sugars 2-3x a day: - 4-5 pm:   131-174, 181 >> 95-135 >> 111-132 - 2h after b'fast:144, 232-262 >> n/c >> 80-167 >> 123-170 - before dinner:  204 >> n/c >> 132 >> n/c - 2h after dinner: 95-271 >> 72-157 >> 84-194 - bedtime:114-224 >> 116-240 >> 104-166 - nighttime:1 111-270, 309 >> 89-181 >> see above Lowest sugar was 111 >> 72 >> 84; he has hypoglycemia awareness in the 70s. Highest sugar was 309 >> 200s >> 195.  Glucometer: FreeStyle  -No CKD, last BUN/creatinine:  Lab Results  Component Value Date   BUN 14 06/18/2018   CREATININE 1.24  06/18/2018   -+ mixed HL;  last set of lipids: Lab Results  Component Value Date   CHOL 197 04/02/2018   HDL 32.70 (L) 04/02/2018   LDLCALC 67 02/02/2014   LDLDIRECT 120.0 04/02/2018   TRIG 341.0 (H) 04/02/2018   CHOLHDL 6 04/02/2018  On fenofibrate. - last eye exam was on 03/2017: No DR - + Numbness; no tingling in his feet. He has recurrent furunculosis - groin.   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  + rash  - scrotal - improved (almost) on Nystatin + Triamcinolone. May need refills in the future.   He started Metamucil for constipation 2/2 Saphris.  ROS: Constitutional: no weight gain/no weight loss, no fatigue, no subjective hyperthermia, no subjective hypothermia Eyes: no blurry vision, no xerophthalmia ENT: no sore throat, no nodules palpated in neck, no dysphagia, no odynophagia, no hoarseness Cardiovascular: no CP/no SOB/no palpitations/no leg swelling Respiratory: no cough/no SOB/no wheezing Gastrointestinal: no N/no V/no D/+ C (Saphris)/no acid reflux Musculoskeletal: no muscle aches/no joint aches Skin: no rashes, no hair loss Neurological: no tremors/no numbness/no tingling/no dizziness  I reviewed pt's medications, allergies, PMH, social hx, family hx, and changes were documented in the history of present illness. Otherwise, unchanged from my initial visit note.  Past Medical History:  Diagnosis Date  . 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 Bipolar affective  disorder (HCC)   . DM w/o Complication Type II 07/05/2007  . HYPERTRIGLYCERIDEMIA 10/14/2010  . Nocturia 07/04/2010  . PILAR CYST 08/03/2007   Cyst in groin-states comes up when blood sugar gets high. Recurs if cannot walk for a week.     . TOBACCO ABUSE, HX OF 07/31/2009   Past Surgical History:  Procedure Laterality Date  . pilar cystectomy     History   Social History  . Marital Status: Single    Spouse Name: N/A  . Number of Children: 0   Occupational History  .    Social History Main  Topics  . Smoking status: Former Smoker - quit in 2010  . Smokeless tobacco: Never Used  . Alcohol Use: No     Comment: none at all  . Drug Use: No   Current Outpatient Prescriptions on File Prior to Visit  Medication Sig Dispense Refill  . fenofibrate (TRICOR) 145 MG tablet Take 1 tablet (145 mg total) by mouth daily. 90 tablet 1  . Insulin Pen Needle 32G X 4 MM MISC Use once a day 100 each 11  . lithium 300 MG capsule Take by mouth. 1 every morning , 4 every evening    . propranolol (INDERAL) 40 MG tablet   0  . SAPHRIS 10 MG SUBL Take 15 mg by mouth at bedtime. Reported on 03/27/2016  0  + Novolog - see HPI  No current facility-administered medications on file prior to visit.    Allergies  Allergen Reactions  . Divalproex Sodium Other (See Comments)    unknown  . Methylphenidate Hcl     Aggravate bipolar  . Oxycodone Hcl     REACTION: hallucinations  . Paroxetine     Aggravate bipolar disorder   Family History  Problem Relation Age of Onset  . Diabetes Mother   . Breast cancer Mother        was told not hereditary  . Cirrhosis Mother        fatty liver  . Diabetes Father   . Melanoma Maternal Uncle   . Colon cancer Neg Hx   . Stomach cancer Neg Hx   . Pancreatitis Neg Hx   . Heart disease Neg Hx   . Kidney disease Neg Hx   . Liver disease Neg Hx    PE: BP 120/88   Pulse 68   Ht 5' 9.25" (1.759 m)   Wt 209 lb (94.8 kg)   SpO2 97%   BMI 30.64 kg/m  Body mass index is 30.64 kg/m. Wt Readings from Last 3 Encounters:  11/29/18 209 lb (94.8 kg)  07/29/18 209 lb (94.8 kg)  06/18/18 206 lb 3.2 oz (93.5 kg)   Constitutional: overweight, in NAD Eyes: PERRLA, EOMI, no exophthalmos ENT: moist mucous membranes, no thyromegaly, no cervical lymphadenopathy Cardiovascular: RRR, No MRG Respiratory: CTA B Gastrointestinal: abdomen soft, NT, ND, BS+ Musculoskeletal: no deformities, strength intact in all 4 Skin: moist, warm, no rashes Neurological: + Mild tremor with  outstretched hands, DTR normal in all 4  ASSESSMENT: 1. DM2, insulin-dependent, uncontrolled, without long term complications, but with hyperglycemia - tested neg for DM1: Component     Latest Ref Rng 05/28/2015  C-Peptide     0.80 - 3.90 ng/mL 2.88  Glucose, Fasting     65 - 99 mg/dL 161120 (H)  Glutamic Acid Decarb Ab     <5 IU/mL <5  Pancreatic Islet Cell Antibody     < 5 JDF Units <5   -  His diabetes is difficult to manage because of multiple intolerances: - We tried to add Invokana but he did not tolerated due to increased urination.  - We cannot use a DPP 4 inhibitor or a GLP-1 receptor agonist due to his history of pancreatitis.  - He had to stop Actos >>  abd. Pain - we stopped Cycloset >> expensive and not effective - we tried Glipizide >> GI sxs: N/V/D  2. HTG  3.  Obesity class I  PLAN:  1. Patient with longstanding, uncontrolled, type 2 diabetes, with improved control after starting Jardiance and with much better sugars at this visit compared to last visit.  Since last visit he was able to come off his long-acting insulin due to controlled blood sugars.  He is now using only NovoLog 20 units before breakfast which is his largest meal of the day, and occasionally also before his dinner.  Of note, these are reversed for him since he usually wakes up in the evening and stays up at night. -Since his sugars are much better now, we did not change his regimen today -He tells me that I am not out of network since he now has to see a provider at [facility].  However, he would like to stay with me.  In this case, we discussed about coming every 6 months, if sugars remain well controlled.  He agrees with this. - I advised him to: Patient Instructions  Please continue: - Metformin 2000 mg once a day - Novolog before meals: 20 units 1-2x a day - Jardiance 25 mg before first meal of the day  Please return in 6 months with your sugar log.   - today, HbA1c is 6.8% (improved) -  continue checking sugars at different times of the day - check 2x a day, rotating checks - advised for yearly eye exams >> he is not UTD - Return to clinic in 6 mo with sugar log    2. HL -He has a history of very high triglycerides >> started fenofibrate and also suggested to go back on the vegan diet. I also advised him to restart exercise. - Reviewed latest lipid panel from 7 mo ago >> improved Lab Results  Component Value Date   CHOL 197 04/02/2018   HDL 32.70 (L) 04/02/2018   LDLCALC 67 02/02/2014   LDLDIRECT 120.0 04/02/2018   TRIG 341.0 (H) 04/02/2018   CHOLHDL 6 04/02/2018  - Continues fenofibrate without side effects.  3.  Obesity class I -continue Jardiance which should also help with wt loss -No weight loss since last visit.  Carlus Pavlov, MD PhD [facility] Endocrinology

## 2018-11-29 NOTE — Patient Instructions (Addendum)
Please continue: - Metformin 2000 mg once a day - Novolog before meals: 20 units 1-2x a day - Jardiance 25 mg before first meal of the day  Please return in 6 months with your sugar log.

## 2018-11-29 NOTE — Addendum Note (Signed)
Addended by: CONGER, MELISSA I on: 11/29/2018 05:09 PM   Modules accepted: Orders  

## 2018-12-21 DIAGNOSIS — G250 Essential tremor: Secondary | ICD-10-CM | POA: Insufficient documentation

## 2018-12-29 ENCOUNTER — Other Ambulatory Visit: Payer: Self-pay

## 2018-12-29 MED ORDER — PROPRANOLOL HCL 40 MG PO TABS: 40.0000 mg | ORAL_TABLET | Freq: Two times a day (BID) | ORAL | 1 refills | Status: DC | PRN

## 2018-12-29 NOTE — Telephone Encounter (Signed)
Received refill request from [facility] for propranolol 40mg  tablets 1 tablet twice a day as needed for tremor. #180, no refills

## 2019-01-04 ENCOUNTER — Other Ambulatory Visit: Payer: Self-pay

## 2019-01-04 MED ORDER — LITHIUM CARBONATE 300 MG PO CAPS: ORAL_CAPSULE | ORAL | 0 refills | Status: DC

## 2019-01-22 ENCOUNTER — Other Ambulatory Visit: Payer: Self-pay | Admitting: Internal Medicine

## 2019-01-22 DIAGNOSIS — E1165 Type 2 diabetes mellitus with hyperglycemia: Secondary | ICD-10-CM

## 2019-01-31 ENCOUNTER — Encounter: Payer: Self-pay | Admitting: Internal Medicine

## 2019-02-03 ENCOUNTER — Ambulatory Visit: Payer: BLUE CROSS/BLUE SHIELD | Admitting: Psychiatry

## 2019-02-04 ENCOUNTER — Other Ambulatory Visit: Payer: Self-pay | Admitting: Internal Medicine

## 2019-02-08 ENCOUNTER — Other Ambulatory Visit: Payer: Self-pay

## 2019-02-08 MED ORDER — ASENAPINE MALEATE 5 MG SL SUBL: 10.0000 mg | SUBLINGUAL_TABLET | Freq: Every day | SUBLINGUAL | 0 refills | Status: DC

## 2019-02-09 ENCOUNTER — Telehealth: Payer: Self-pay

## 2019-02-09 NOTE — Telephone Encounter (Signed)
Prior authorization submitted for Saphris 5 mg 2 at hs through [facility] approved effective 02/09/2019-02/07/2022

## 2019-02-11 ENCOUNTER — Ambulatory Visit (INDEPENDENT_AMBULATORY_CARE_PROVIDER_SITE_OTHER): Payer: Self-pay | Admitting: Internal Medicine

## 2019-02-11 ENCOUNTER — Encounter: Payer: Self-pay | Admitting: Internal Medicine

## 2019-02-11 DIAGNOSIS — E669 Obesity, unspecified: Secondary | ICD-10-CM

## 2019-02-11 DIAGNOSIS — E1165 Type 2 diabetes mellitus with hyperglycemia: Secondary | ICD-10-CM

## 2019-02-11 DIAGNOSIS — E781 Pure hyperglyceridemia: Secondary | ICD-10-CM

## 2019-02-11 MED ORDER — INSULIN PEN NEEDLE 32G X 4 MM MISC: 3 refills | Status: DC

## 2019-02-11 MED ORDER — LANTUS SOLOSTAR 100 UNIT/ML ~~LOC~~ SOPN: 15.0000 [IU] | PEN_INJECTOR | Freq: Every day | SUBCUTANEOUS | 11 refills | Status: DC

## 2019-02-11 NOTE — Patient Instructions (Addendum)
Please continue: - Metformin 2000 mg once a day - Novolog before meals: 20(-30) units 2-3x a day - Jardiance 25 mg before first meal of the day  Please restart Lantus 15 units at bedtime.  Please restart exercise.  Please return in 4 months with your sugar log.

## 2019-02-11 NOTE — Progress Notes (Signed)
Patient ID: Cody Short, male   DOB: December 13, 1975, 43 y.o.   MRN: [id number]  Patient location: Home My location: Office  Referring Provider: Shelva Majestic, MD  I connected with the patient on 02/11/19 at  8:34 AM EDT by a video enabled telemedicine application and verified that I am speaking with the correct person.   I discussed the limitations of evaluation and management by telemedicine and the availability of in person appointments. The patient expressed understanding and agreed to proceed.   Details of the encounter are shown below.  HPI: Cody Short is a 43 y.o.-year-old male, presenting for follow-up for DM 2, dx in ~2010, insulin-dependent, uncontrolled, without long term complications. Last visit 2.5 months ago.  Due to the change in insurance, he had to change his PCP and will see Dr. Lucianne Muss at [facility].  I am not in his network but he prefers to come to see me.  At last visit, sugars were much better and he felt that this was due to Metamucil which reduced his hunger.  Last hemoglobin A1c was: 0226/2020: HbA1c 7.2% Lab Results  Component Value Date   HGBA1C 6.8 (A) 11/29/2018   HGBA1C 8.1 (A) 04/02/2018   HGBA1C 7.4 12/03/2017   Received the following message from the patient on 01/31/2019: hi, i am taking novalog 2x a day 20-35 and jardiance  and metformin max. i am currently having spouts of high blood sugar and it might be because i am not exercising or moving around alot lately. i am honestly afraid of injuring myself exercising, and would have to goto doctor, i know it sounds stupid. after all this virus is over i will get on a good regimand of exercise no problem. just dont want to get my mom sick if i catch it. anyway. i have blood sugars of 190-250 before bed, so i was thinking i need lantus again. i hope all is well with you.   Pt is on a regimen of: - Metformin 2000 mg with b'fast (4 pm) - Novolog before meals: 20(-35) units 2-3x a  day - Jardiance 25 mg before first meal of the day-started 03/2018 He was previously on Lantus but stopped since sugars improved. He tried Glipizide >> hypoglycemia in the 40s repeatedly. Lowest: 25. Also, Glipizide 2.5 mg in am >> nausea, vomiting, constipation We tried Invokana >> bothersome urination (when he was working) >> had to stop. He had pancreatitis 2/2 Depakote and HTG in the past. He stopped Actos b/c stomach pain.  We stopped Cycloset >> inefficient, could not use b/c price.  Pt checks his sugars 2-3 times a day: - 5-6 pm:  131-174, 181 >> 95-135 >> 111-132 >> 129-152 - 2h after b'fast: n/c >> 80-167 >> 123-170 >> 96-200 - lunch: n/c - 2h after lunch: 134-147 - before dinner:  204 >> n/c >> 132 >> n/c >> 190-220 - 2h after dinner: 95-271 >> 72-157 >> 84-194 >> n/c - bedtime:114-224 >> 116-240 >> 104-166 >> 190-250, 300 - nighttime:1 111-270, 309 >> 89-181 >> see above Lowest sugar was  72 >> 84 >> 96; he has hypoglycemia awareness in the 70s. Highest sugar was 195 >> 300.  Glucometer: FreeStyle  -No history of CKD, last BUN/creatinine:  12/22/2018: 15/1.13, GFR 81, glucose 110 Lab Results  Component Value Date   BUN 14 06/18/2018   CREATININE 1.24 06/18/2018   He had elevated ACR: 12/22/2018: 262  -+ Mixed hyperlipidemia;  last set of lipids: 12/22/2018: 220/359/41/145 Lab  Results  Component Value Date   CHOL 197 04/02/2018   HDL 32.70 (L) 04/02/2018   LDLCALC 67 02/02/2014   LDLDIRECT 120.0 04/02/2018   TRIG 341.0 (H) 04/02/2018   CHOLHDL 6 04/02/2018  On fenofibrate. He will add a statin soon by PCP.  - last eye exam was on 03/2017: No DR  -+ Numbness; no tingling in his feet.  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  + rash  - scrotal - improved (almost) on Nystatin + Triamcinolone. May need refills in the future. He has recurrent groin furunculosis.  He is on Metamucil for constipation 2/2 Saphris.  Most recent TSH was normal, at 3.648  on 12/22/2018.  ROS: Constitutional: + weight gain/no weight loss, no fatigue, no subjective hyperthermia, no subjective hypothermia Eyes: no blurry vision, no xerophthalmia ENT: no sore throat, no nodules palpated in neck, no dysphagia, no odynophagia, no hoarseness Cardiovascular: no CP/no SOB/no palpitations/no leg swelling Respiratory: no cough/no SOB/no wheezing Gastrointestinal: no N/no V/no D/+ C/no acid reflux Musculoskeletal: no muscle aches/no joint aches Skin: no rashes, no hair loss Neurological: no tremors/no numbness/no tingling/no dizziness  I reviewed pt's medications, allergies, PMH, social hx, family hx, and changes were documented in the history of present illness. Otherwise, unchanged from my initial visit note.  Past Medical History:  Diagnosis Date  . 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 Bipolar affective disorder (HCC)   . DM w/o Complication Type II 07/05/2007  . HYPERTRIGLYCERIDEMIA 10/14/2010  . Nocturia 07/04/2010  . PILAR CYST 08/03/2007   Cyst in groin-states comes up when blood sugar gets high. Recurs if cannot walk for a week.     . TOBACCO ABUSE, HX OF 07/31/2009   Past Surgical History:  Procedure Laterality Date  . pilar cystectomy     History   Social History  . Marital Status: Single    Spouse Name: N/A  . Number of Children: 0   Occupational History  .    Social History Main Topics  . Smoking status: Former Smoker - quit in 2010  . Smokeless tobacco: Never Used  . Alcohol Use: No     Comment: none at all  . Drug Use: No   Current Outpatient Medications  Medication Sig Dispense Refill  . asenapine (SAPHRIS) 5 MG SUBL 24 hr tablet Place 2 tablets (10 mg total) under the tongue at bedtime. 180 tablet 0  . ciclopirox (PENLAC) 8 % solution 1 ML APPLY TO NAILS DAILY APPLY ONCE DAILY TO AFFECTED NAILS  2  . clotrimazole-betamethasone (LOTRISONE) cream Apply 1 application topically 2 (two) times daily. For 10 days 45 g 0  . empagliflozin (JARDIANCE)  25 MG TABS tablet Take 25 mg by mouth daily. 90 tablet 3  . fenofibrate (TRICOR) 145 MG tablet TAKE 1 TABLET BY MOUTH DAILY 90 tablet 1  . glucose blood (FREESTYLE LITE) test strip USE 5 TIMES DAILY 100 each 4  . Insulin Pen Needle (BD PEN NEEDLE NANO U/F) 32G X 4 MM MISC USE 3 TIMES DAILY WITH INSULIN PENS 270 each 0  . Lancets (ACCU-CHEK MULTICLIX) lancets Use 3x instructed 200 each 3  . LANTUS SOLOSTAR 100 UNIT/ML Solostar Pen Inject 35 Units into the skin daily at 10 pm. 15 pen 0  . lithium carbonate 300 MG capsule Take 1 capsule in the morning and 3 capsules every evening 360 capsule 0  . metFORMIN (GLUCOPHAGE-XR) 500 MG 24 hr tablet TAKE 4 TABLETS( 2000 MG TOTAL) BY MOUTH DAILY WITH SUPPER 360 tablet  0  . propranolol (INDERAL) 40 MG tablet Take 1 tablet (40 mg total) by mouth 2 (two) times daily as needed. For tremor 180 tablet 1  . SAPHRIS 10 MG SUBL Take 10 mg at bedtime by mouth. Reported on 03/27/2016  0  . Sulfacetamide Sodium-Sulfur (SULFACLEANSE 8/4) 8-4 % SUSP Apply topically.    . triamcinolone (KENALOG) 0.025 % cream APPLY TO AFFECTED AREA TWICE A DAY  2  . hydrocortisone (ANUSOL-HC) 2.5 % rectal cream Place 1 application rectally at bedtime as needed for hemorrhoids or anal itching. (Patient not taking: Reported on 02/11/2019) 30 g 0  . hydrocortisone (ANUSOL-HC) 25 MG suppository Place 1 suppository (25 mg total) rectally 2 (two) times daily. (Patient not taking: Reported on 02/11/2019) 12 suppository 0  . ipratropium (ATROVENT) 0.03 % nasal spray Place 2 sprays into both nostrils 3 (three) times daily with meals. 30 mL 11  . NOVOLOG FLEXPEN 100 UNIT/ML FlexPen INJECT 20 TO 35 UNITS UNDER THE SKIN BEFORE LUNCH AND DINNER 60 mL 3  . nystatin-triamcinolone ointment (MYCOLOG) Apply to rectal area for internal hemorrhoids twice daily. 30 g 0   No current facility-administered medications for this visit.     No current facility-administered medications on file prior to visit.     Allergies  Allergen Reactions  . Divalproex Sodium Other (See Comments)    unknown  . Methylphenidate Hcl     Aggravate bipolar  . Oxycodone Hcl     REACTION: hallucinations  . Paroxetine     Aggravate bipolar disorder   Family History  Problem Relation Age of Onset  . Diabetes Mother   . Breast cancer Mother        was told not hereditary  . Cirrhosis Mother        fatty liver  . Diabetes Father   . Melanoma Maternal Uncle   . Colon cancer Neg Hx   . Stomach cancer Neg Hx   . Pancreatitis Neg Hx   . Heart disease Neg Hx   . Kidney disease Neg Hx   . Liver disease Neg Hx    PE: There were no vitals taken for this visit. There is no height or weight on file to calculate BMI. Wt Readings from Last 3 Encounters:  11/29/18 209 lb (94.8 kg)  07/29/18 209 lb (94.8 kg)  06/18/18 206 lb 3.2 oz (93.5 kg)   Constitutional:  in NAD  The physical exam was not performed (virtual visit).  ASSESSMENT: 1. DM2, insulin-dependent, uncontrolled, without long term complications, but with hyperglycemia  His test were negative for type 1 diabetes: Component     Latest Ref Rng 05/28/2015  C-Peptide     0.80 - 3.90 ng/mL 2.88  Glucose, Fasting     65 - 99 mg/dL 956 (H)  Glutamic Acid Decarb Ab     <5 IU/mL <5  Pancreatic Islet Cell Antibody     < 5 JDF Units <5   - His diabetes is difficult to manage because of multiple intolerances: - We tried to add Invokana but he did not tolerated due to increased urination.  - We cannot use a DPP 4 inhibitor or a GLP-1 receptor agonist due to his history of pancreatitis.  - He had to stop Actos >>  abd. Pain - we stopped Cycloset >> expensive and not effective - we tried Glipizide >> GI sxs: N/V/D  2. HTG  3.  Obesity class I  PLAN:  1. Patient with longstanding, uncontrolled, type 2 diabetes,  with improved control after starting Jardiance, improving his diet, and also starting exercise.  She was able to come off his long-acting  insulin due to controlled blood sugars in the past.  At last visit, he was using metformin, Jardiance, and occasionally NovoLog.  However, since then, especially during the coronavirus pandemic and quarantine at home, he stopped exercising and sugar started to worsen.  He had to increase his NovoLog without good control. -At this visit, sugars are worse, as he is not exercising and he is eating more due to being stressed (coronavirus pandemic, home quarantine).  He is also telling me that he does not have a lot of time to exercise as he is playing games online the majority of his night and sleeps the majority of his days.  We discussed that he will need to find at least a 30-minute window in his day but ideally approximately an hour to restart exercising.  He agrees to do so.  In the meantime, we discussed about adding back Lantus, at least for period of time until his sugars improved, and then we can again stop it.  I advised him to not take a high dose of NovoLog after adding Lantus.  We will continue metformin and Jardiance. -He will have a virtual appointment with PCP soon and at that time they will discuss addition of a statin and also an ACE inhibitor/ARB for his increased urinary proteins - I advised him to: Patient Instructions  Please continue: - Metformin 2000 mg once a day - Novolog before meals: 20(-30) units 2-3x a day - Jardiance 25 mg before first meal of the day  Please restart Lantus 15 units at bedtime.  Please restart exercise.  Please return in 4 months with your sugar log.   -We will check another HbA1c when he returns to the clinic - continue checking sugars at different times of the day - check 2-3x a day, rotating checks - advised for yearly eye exams >> he is not UTD - Return to clinic in 4 mo with sugar log  - He prefers to come to the clinic on a less frequent basis since the visit are expensive for him (I am out of network).  2. HL -He has a history of  hypertriglyceridemia-started fenofibrate and also suggested to go back on the vegan diet.  He is now not exercising but I strongly advised him to restart. -Reviewed latest lipid panel from 11/2018: LDL above target, triglycerides still high -Continues fenofibrate without side effects.  He had an appointment with PCP recently and LDL was even higher than before, at 145.  He will have an appointment with his PCP and discuss the addition of a statin.  3.  Obesity class I -Gained weight recently due to dietary indiscretions and not exercising.  We discussed about reducing snacks and restarting exercise. -Continue Jardiance which should also help with weight loss   Carlus Pavlov, MD PhD [facility] Endocrinology

## 2019-02-18 ENCOUNTER — Encounter: Payer: Self-pay | Admitting: Internal Medicine

## 2019-03-03 ENCOUNTER — Ambulatory Visit (INDEPENDENT_AMBULATORY_CARE_PROVIDER_SITE_OTHER): Payer: BLUE CROSS/BLUE SHIELD | Admitting: Psychiatry

## 2019-03-03 ENCOUNTER — Encounter: Payer: Self-pay | Admitting: Psychiatry

## 2019-03-03 DIAGNOSIS — G251 Drug-induced tremor: Secondary | ICD-10-CM | POA: Diagnosis not present

## 2019-03-03 DIAGNOSIS — F4001 Agoraphobia with panic disorder: Secondary | ICD-10-CM

## 2019-03-03 DIAGNOSIS — F1211 Cannabis abuse, in remission: Secondary | ICD-10-CM

## 2019-03-03 DIAGNOSIS — F411 Generalized anxiety disorder: Secondary | ICD-10-CM

## 2019-03-03 DIAGNOSIS — F250 Schizoaffective disorder, bipolar type: Secondary | ICD-10-CM | POA: Diagnosis not present

## 2019-03-03 MED ORDER — SERTRALINE HCL 50 MG PO TABS: ORAL_TABLET | ORAL | 1 refills | Status: DC

## 2019-03-03 NOTE — Progress Notes (Signed)
Cody Short [id number] Sep 14, 1976 43 y.o.   Virtual Visit via Telephone Note  I connected with pt by telephone and verified that I am speaking with the correct person using two identifiers.   I discussed the limitations, risks, security and privacy concerns of performing an evaluation and management service by telephone and the availability of in person appointments. I also discussed with the patient that there may be a patient responsible charge related to this service. The patient expressed understanding and agreed to proceed.  I discussed the assessment and treatment plan with the patient. The patient was provided an opportunity to ask questions and all were answered. The patient agreed with the plan and demonstrated an understanding of the instructions.   The patient was advised to call back or seek an in-person evaluation if the symptoms worsen or if the condition fails to improve as anticipated.  I provided 30 minutes of non-face-to-face time during this encounter. The call started at 430 and ended at 5:00. The patient was located at home and the provider was located office.   Subjective:   Patient ID:  Cody Short is a 43 y.o. (DOB August 14, 1976) male.  Chief Complaint:  Chief Complaint  Patient presents with  . Follow-up    Medication Management  . Depression    Increased  . Anxiety    Increased     Cody Short presents to the office today for follow-up of several psychiatric diagnoses ...    Last seen November 04, 2018.  No meds were changed at that visit though he had recently reduced Saphris to 10 mg from 15 mg daily for both cost reasons and sedation.  He was doing well at the time.  Increased obsessions over an old GF dating another friend.  He had been a best friend.  Had been thinking he was out to get him but mother said he was overly paranoid about it.  Still obsessing though less paranoid.  This happened 20 years ago.  For the longest  time memories were gone but they've started popping up again.  [place] it had something to do with his prior mental breakdowns.  Over his life he's had 5 girls that he's obsessed over. No other obsessions. Mix of emotions but mostly depressed.  Obs not worse with reduction in Saphris.  Had it for 2-3 years.  If not thinking about is not depressed.  Some anxiety when driving and when talks on teleconferences with doctors.  Problems with diabetes lately.  At the last visit we reduced the Saphris for cost and sedation reasons.   Overall done really well with moods.  No sig problems with mania with the reduction of Saphris.  No longer excessively drowsy.  Occ conflict with people but manageable.  Recognizes mistakes he's made in the past due to drugs and bipolar disorder.  Less fear of driving and doing it more.  Patient reports stable mood and deniesr irritable moods.  Patient reports some recent difficulty with anxiety with GM living with them and she's difficult.  Patient denies difficulty with sleep initiation or maintenance; 6-7hours. Delays sleep phase chronically.  Denies appetite disturbance.  Patient reports that energy and motivation have been good.  Patient denies any difficulty with concentration.  Patient denies any suicidal ideation.  Admits he's easily stressed.  Works with [employer].  Prior psychiatric medications include risperidone with cognitive side effects, Abilify, Depakote (Note patient had severe pancreatitis from Depakote.), Equetro, Zyprexa, Seroquel 600 mg, perphenazine, Saphris,  haloperidol, N-acetylcysteine with minimal benefit, Geodon, Amotidine with vomiting. ,  Propranolol for tremor, lithium, Saphris, Vraylar.  He's prone to EPS.  Review of Systems:  Review of Systems  Neurological: Positive for tremors. Negative for weakness.  Psychiatric/Behavioral: Positive for decreased concentration and depression. Negative for agitation, behavioral problems, confusion, dysphoric  mood, hallucinations, self-injury, sleep disturbance and suicidal ideas. The patient is nervous/anxious. The patient is not hyperactive.     Medications: I have reviewed the patient's current medications.  Current Outpatient Medications  Medication Sig Dispense Refill  . asenapine (SAPHRIS) 5 MG SUBL 24 hr tablet Place 2 tablets (10 mg total) under the tongue at bedtime. 180 tablet 0  . doxycycline (VIBRAMYCIN) 100 MG capsule     . empagliflozin (JARDIANCE) 25 MG TABS tablet Take 25 mg by mouth daily. 90 tablet 3  . fenofibrate (TRICOR) 145 MG tablet TAKE 1 TABLET BY MOUTH DAILY 90 tablet 1  . glucose blood (FREESTYLE LITE) test strip USE 5 TIMES DAILY 100 each 4  . Insulin Pen Needle (BD PEN NEEDLE NANO U/F) 32G X 4 MM MISC USE 3 TIMES DAILY WITH INSULIN PENS 300 each 3  . ipratropium (ATROVENT) 0.03 % nasal spray Place 2 sprays into both nostrils 3 (three) times daily with meals. 30 mL 11  . Lancets (ACCU-CHEK MULTICLIX) lancets Use 3x instructed 200 each 3  . LANTUS SOLOSTAR 100 UNIT/ML Solostar Pen Inject 15-20 Units into the skin daily at 10 pm. 5 pen 11  . lithium carbonate 300 MG capsule Take 1 capsule in the morning and 3 capsules every evening 360 capsule 0  . metFORMIN (GLUCOPHAGE-XR) 500 MG 24 hr tablet TAKE 4 TABLETS( 2000 MG TOTAL) BY MOUTH DAILY WITH SUPPER 360 tablet 0  . NOVOLOG FLEXPEN 100 UNIT/ML FlexPen INJECT 20 TO 35 UNITS UNDER THE SKIN BEFORE LUNCH AND DINNER 60 mL 3  . nystatin-triamcinolone ointment (MYCOLOG) Apply to rectal area for internal hemorrhoids twice daily. 30 g 0  . propranolol (INDERAL) 40 MG tablet Take 1 tablet (40 mg total) by mouth 2 (two) times daily as needed. For tremor 180 tablet 1  . triamcinolone (KENALOG) 0.025 % cream APPLY TO AFFECTED AREA TWICE A DAY  2   No current facility-administered medications for this visit.     Medication Side Effects: Other: less tremor  Allergies:  Allergies  Allergen Reactions  . Divalproex Sodium Other (See  Comments)    unknown  . Methylphenidate Hcl     Aggravate bipolar  . Oxycodone Hcl     REACTION: hallucinations  . Paroxetine     Aggravate bipolar disorder    Past Medical History:  Diagnosis Date  . 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 Bipolar affective disorder (HCC)   . DM w/o Complication Type II 07/05/2007  . HYPERTRIGLYCERIDEMIA 10/14/2010  . Nocturia 07/04/2010  . PILAR CYST 08/03/2007   Cyst in groin-states comes up when blood sugar gets high. Recurs if cannot walk for a week.     . TOBACCO ABUSE, HX OF 07/31/2009    Family History  Problem Relation Age of Onset  . Diabetes Mother   . Breast cancer Mother        was told not hereditary  . Cirrhosis Mother        fatty liver  . Diabetes Father   . Melanoma Maternal Uncle   . Colon cancer Neg Hx   . Stomach cancer Neg Hx   . Pancreatitis Neg Hx   . Heart disease Neg  Hx   . Kidney disease Neg Hx   . Liver disease Neg Hx     Social History   Socioeconomic History  . Marital status: Single    Spouse name: Not on file  . Number of children: Not on file  . Years of education: Not on file  . Highest education level: Not on file  Occupational History  . Not on file  Social Needs  . Financial resource strain: Not on file  . Food insecurity:    Worry: Not on file    Inability: Not on file  . Transportation needs:    Medical: Not on file    Non-medical: Not on file  Tobacco Use  . Smoking status: Former Smoker    Packs/day: 2.50    Years: 10.00    Pack years: 25.00    Types: Cigarettes    Last attempt to quit: 10/27/2006    Years since quitting: 12.3  . Smokeless tobacco: Never Used  Substance and Sexual Activity  . Alcohol use: No    Alcohol/week: 0.0 standard drinks    Comment: none at all  . Drug use: No  . Sexual activity: Not on file  Lifestyle  . Physical activity:    Days per week: Not on file    Minutes per session: Not on file  . Stress: Not on file  Relationships  . Social connections:    Talks  on phone: Not on file    Gets together: Not on file    Attends religious service: Not on file    Active member of club or organization: Not on file    Attends meetings of clubs or organizations: Not on file    Relationship status: Not on file  . Intimate partner violence:    Fear of current or ex partner: Not on file    Emotionally abused: Not on file    Physically abused: Not on file    Forced sexual activity: Not on file  Other Topics Concern  . Not on file  Social History Narrative   Single. Not dating currently. No kids.    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   Please see review of systems for further details on the patient's review from today.   Objective:   Physical Exam:  There were no vitals taken for this visit.  Physical Exam Neurological:     Mental Status: He is alert and oriented to person, place, and time.     Cranial Nerves: No dysarthria.  Psychiatric:        Attention and Perception: Attention normal.        Mood and Affect: Mood is anxious and depressed.        Speech: Speech normal.        Behavior: Behavior is not agitated or hyperactive. Behavior is cooperative.        Thought Content: Thought content is not paranoid or delusional. Thought content does not include homicidal or suicidal ideation. Thought content does not include homicidal or suicidal plan.        Cognition and Memory: Cognition and memory normal.     Comments: No paranoia.  Obsessing over girl from 20 years ago. Insight and judgment.  Lab Review:     Component Value Date/Time   NA 140 06/18/2018 1502   K 4.5 06/18/2018 1502   CL 107 06/18/2018 1502   CO2 19 (L) 06/18/2018 1502   GLUCOSE 129 (H) 06/18/2018 1502   BUN 14 06/18/2018 1502   CREATININE 1.24  06/18/2018 1502   CALCIUM 10.3 06/18/2018 1502   PROT 8.3 (H) 06/18/2018 1502   ALBUMIN 4.4 02/10/2017 1631   AST 26 06/18/2018 1502   ALT 24 06/18/2018 1502   ALKPHOS 54 02/10/2017 1631   BILITOT 0.4 06/18/2018 1502   GFRNONAA 84 04/02/2018 1551   GFRAA 97 04/02/2018 1551       Component Value Date/Time   WBC 11.3 (H) 06/27/2016 1441   RBC 4.52 06/27/2016 1441   HGB 13.5 06/27/2016 1441   HCT 39.0 06/27/2016 1441   PLT 248.0 06/27/2016 1441   MCV 86.3 06/27/2016 1441   MCH 29.6 01/28/2014 1250   MCHC 34.5 06/27/2016 1441   RDW 13.2 06/27/2016 1441   LYMPHSABS 1.9 06/27/2016 1441   MONOABS 0.8 06/27/2016 1441   EOSABS 0.2 06/27/2016 1441   BASOSABS 0.1 06/27/2016 1441    Lithium Lvl  Date Value Ref Range Status  07/20/2018 0.7 0.6 - 1.2 mmol/L Final     No results found for: PHENYTOIN, PHENOBARB, VALPROATE, CBMZ   .res Assessment: Plan:    Schizoaffective disorder, bipolar type (HCC)  Generalized anxiety disorder  Panic disorder with agoraphobia  Tremor due to multiple drugs  Marijuana abuse in remission   Cody Short has had significant disruptive mood and psychotic symptoms and substance abuse over the course of his treatment here.  It has been evident and chronic occupational dysfunction and some relational problems.  He is also been very prone to EPS making it difficult to get adequate mood stabilization.  He has not had adequate mood stabilization from lithium alone.  Has a history of severe pancreatitis from Depakote.  Is still doing well after having reduced Saphris from 15 mg to 10 mg due to excessive sleepiness.  He is still sleeping adequately at this time and is not reported a significant risk increase in manic symptoms.  There is still some risk of this and he is aware.  I recommended counseling for this obsessive thoughts of her old girlfriends.  This does not appear appear to be classic OCD though may it may be some variant.  Some schizoaffective disorder  patients will develop obsessive symptoms over time.  He is reluctant to start counseling because he does not currently have insurance.  He wants to try medication.  We discussed that the usual meds used for anxiety and obsessive thinking are serotonin medications and then that has some risk of causing mania or mood swings.  However its probably the only type of medication that would help.  He wants to try it anyway in spite of the risk.  We will try the lowest dose possible.  He will call us if he develops any manic symptoms irritability or mood swings or insomnia.  Discussed side effects of SSRIs.  We discussed the alternative of buspirone but is not particularly effective for obsessive thinking.  Start sertraline 25 mg daily for 1 week then 50 mg daily If this fails to be helpful within 6 weeks call back and we can consider increasing the dose as long as there are no side effects.  Discussed potential metabolic side effects associated with atypical antipsychotics, as well as potential risk for movement side effects.  Advised pt to contact office if movement side effects occur.   Been EPS prone in the past and tremor prone.  Propranolol is managing the tremor.  He has a low stress tolerance.  He can be easily agitated in public.  Disc in detail the interaction between antihypertensives including those that are used in diabetic patients to protect the kidneys and lithium.  It is not an absolute contraindication but he needs to have a lithium level checked within a week of starting 1 of those medications.  He will discuss that with his primary doctor treating the diabetes.  He is continued abstinence from marijuana is encouraged.  He has a history of cannabinoid hyperemesis which finally convinced him to discontinue the marijuana.  His mood disorder and thought disorganization have improved since being off the marijuana.  He has a very long history of heavy marijuana dependence.  Follow-up 3  months  Meredith Staggersarey Cottle, MD, D FA PA  Please see After Visit Summary for patient specific instructions.  Future Appointments  Date Time Provider Department Center  06/14/2019  3:15 PM Carlus PavlovGherghe, Cristina, MD [id number] None    No orders of the defined types were placed in this encounter.     -------------------------------

## 2019-03-14 ENCOUNTER — Other Ambulatory Visit: Payer: Self-pay

## 2019-03-14 ENCOUNTER — Telehealth: Payer: Self-pay | Admitting: Psychiatry

## 2019-03-14 MED ORDER — ESCITALOPRAM OXALATE 5 MG PO TABS: ORAL_TABLET | ORAL | 0 refills | Status: DC

## 2019-03-14 NOTE — Telephone Encounter (Signed)
He's aware to discontinue sertraline and advised on new medication. Instructed to call back with any problems or concerns. Verbalized understanding.

## 2019-03-14 NOTE — Telephone Encounter (Signed)
Okay to stop sertraline due to sexual side effects.  We will start low-dose Lexapro which has a lower risk of sexual side effects.  Lexapro 5 mg tablets #30- 1 daily in the p.m.

## 2019-03-14 NOTE — Telephone Encounter (Signed)
Patient left a message about having a problem with the zoloft that he started plese call him at [phone]

## 2019-03-27 ENCOUNTER — Encounter: Payer: Self-pay | Admitting: Internal Medicine

## 2019-03-31 ENCOUNTER — Other Ambulatory Visit: Payer: Self-pay

## 2019-03-31 ENCOUNTER — Telehealth: Payer: Self-pay | Admitting: Psychiatry

## 2019-03-31 MED ORDER — ESCITALOPRAM OXALATE 5 MG PO TABS: ORAL_TABLET | ORAL | 1 refills | Status: DC

## 2019-03-31 NOTE — Telephone Encounter (Signed)
Pt left v-mail for refill Lexapro to [facility] account to be able to get 3 months Rx

## 2019-03-31 NOTE — Telephone Encounter (Signed)
Refill sent for 90 day supply. 

## 2019-04-01 ENCOUNTER — Other Ambulatory Visit: Payer: Self-pay | Admitting: Family Medicine

## 2019-04-01 ENCOUNTER — Other Ambulatory Visit: Payer: Self-pay | Admitting: Psychiatry

## 2019-04-05 ENCOUNTER — Other Ambulatory Visit: Payer: Self-pay | Admitting: Psychiatry

## 2019-04-09 ENCOUNTER — Encounter: Payer: Self-pay | Admitting: Internal Medicine

## 2019-04-27 ENCOUNTER — Encounter: Payer: Self-pay | Admitting: Internal Medicine

## 2019-04-30 ENCOUNTER — Other Ambulatory Visit: Payer: Self-pay | Admitting: Internal Medicine

## 2019-05-04 ENCOUNTER — Ambulatory Visit: Payer: BLUE CROSS/BLUE SHIELD | Admitting: Psychiatry

## 2019-05-05 ENCOUNTER — Telehealth: Payer: Self-pay | Admitting: Psychiatry

## 2019-05-05 NOTE — Telephone Encounter (Signed)
Pt left v-mail depression meds he's taking has increased his appetite and messes with his blood sugar. Have questions about other meds.    may need to change meds if possible. Call @ [phone]

## 2019-05-06 NOTE — Telephone Encounter (Signed)
Lexapro is not a major weight gainer generally.  I will need to review the old chart to recommend changes bc he is bipolar. He'll have to wait until I return for me to make changes.

## 2019-05-06 NOTE — Telephone Encounter (Signed)
Pt. Made aware.

## 2019-06-04 ENCOUNTER — Other Ambulatory Visit: Payer: Self-pay | Admitting: Psychiatry

## 2019-06-06 ENCOUNTER — Encounter: Payer: Self-pay | Admitting: Psychiatry

## 2019-06-06 ENCOUNTER — Ambulatory Visit (INDEPENDENT_AMBULATORY_CARE_PROVIDER_SITE_OTHER): Payer: BLUE CROSS/BLUE SHIELD | Admitting: Psychiatry

## 2019-06-06 DIAGNOSIS — F4001 Agoraphobia with panic disorder: Secondary | ICD-10-CM

## 2019-06-06 DIAGNOSIS — F411 Generalized anxiety disorder: Secondary | ICD-10-CM | POA: Diagnosis not present

## 2019-06-06 DIAGNOSIS — G251 Drug-induced tremor: Secondary | ICD-10-CM | POA: Diagnosis not present

## 2019-06-06 DIAGNOSIS — F250 Schizoaffective disorder, bipolar type: Secondary | ICD-10-CM | POA: Diagnosis not present

## 2019-06-06 DIAGNOSIS — F1211 Cannabis abuse, in remission: Secondary | ICD-10-CM

## 2019-06-06 DIAGNOSIS — F5105 Insomnia due to other mental disorder: Secondary | ICD-10-CM

## 2019-06-06 NOTE — Progress Notes (Signed)
[name] [id number] [birth year] 43 y.o.   Virtual Visit via Telephone Note  I connected with pt by telephone and verified that I am speaking with the correct person using two identifiers.   I discussed the limitations, risks, security and privacy concerns of performing an evaluation and management service by telephone and the availability of in person appointments. I also discussed with the patient that there may be a patient responsible charge related to this service. The patient expressed understanding and agreed to proceed.  I discussed the assessment and treatment plan with the patient. The patient was provided an opportunity to ask questions and all were answered. The patient agreed with the plan and demonstrated an understanding of the instructions.   The patient was advised to call back or seek an in-person evaluation if the symptoms worsen or if the condition fails to improve as anticipated.  I provided 30 minutes of non-face-to-face time during this encounter. The call started at 930 and ended at 10:00. The patient was located at home and the provider was located office.   Subjective:   Patient ID:  Cody Short is a 42 y.o. (DOB 1976/07/15) male.  Chief Complaint:  Chief Complaint  Patient presents with  . Anxiety  . Follow-up    psych med management  . Medication Problem     Kyian P Bobeck presents to the office today for follow-up of several psychiatric diagnoses ...    Last seen May 2020.  For obsessive anxiety he was started on sertraline 50 mg daily.  Called back and wanted to stop sertraline DT sexual SE.  Switched Lexapro 5 mg daily on May 18.    Says he made him hungry and eat too much and no sexual drive.  BS is now too high.  Positive side more jovial and less obsessions on girlfriend.  Sleep still normal except reversed cycle..  Reduced obsessions over an old GF dating another friend.  He had been a best friend.  HThis  happened 20 years ago.   Over his life he's had 5 girls that he's obsessed over. No other obsessions.   At the last visit we reduced the Saphris for cost and sedation reasons.   Overall done really well with moods.  No sig problems with mania with the reduction of Saphris.  No longer excessively drowsy.  Occ conflict with people but manageable.  Recognizes mistakes he's made in the past due to drugs and bipolar disorder.  Less fear of driving and doing it more.  Patient reports stable mood and deniesr irritable moods.  Patient reports some recent difficulty with anxiety with GM living with them and she's difficult.  Patient denies difficulty with sleep initiation or maintenance; 6-7hours. Delays sleep phase chronically.  Denies appetite disturbance.  Patient reports that energy and motivation have been good.  Patient denies any difficulty with concentration.  Patient denies any suicidal ideation.  Admits he's easily stressed.  Works with [employer].  Prior psychiatric medications include risperidone with cognitive side effects, Abilify, Depakote (Note patient had severe pancreatitis from Depakote.), Equetro, Zyprexa, Seroquel 600 mg, perphenazine, Saphris, haloperidol, N-acetylcysteine with minimal benefit, Geodon, Amotidine with vomiting. Sertraline and lexapro sexual SE,  Propranolol for tremor, lithium, Saphris, Vraylar.  He's prone to EPS.  Review of Systems:  Review of Systems  Neurological: Positive for tremors. Negative for weakness.  Psychiatric/Behavioral: Positive for decreased concentration. Negative for agitation, behavioral problems, confusion, dysphoric mood, hallucinations, self-injury, sleep disturbance and suicidal ideas. The patient  is nervous/anxious. The patient is not hyperactive.     Medications: I have reviewed the patient's current medications.  Current Outpatient Medications  Medication Sig Dispense Refill  . asenapine (SAPHRIS) 5 MG SUBL 24 hr tablet Place 2 tablets  (10 mg total) under the tongue at bedtime. 180 tablet 0  . doxycycline (VIBRAMYCIN) 100 MG capsule     . empagliflozin (JARDIANCE) 25 MG TABS tablet Take 25 mg by mouth daily. 90 tablet 3  . escitalopram (LEXAPRO) 5 MG tablet TAKE 1 TABLET EVERY EVENING 30 tablet 5  . fenofibrate (TRICOR) 145 MG tablet TAKE 1 TABLET BY MOUTH DAILY 90 tablet 1  . glucose blood (FREESTYLE LITE) test strip USE 5 TIMES DAILY 100 each 4  . Insulin Pen Needle (BD PEN NEEDLE NANO U/F) 32G X 4 MM MISC USE 3 TIMES DAILY WITH INSULIN PENS 300 each 3  . ipratropium (ATROVENT) 0.03 % nasal spray Place 2 sprays into both nostrils 3 (three) times daily with meals. 30 mL 11  . Lancets (ACCU-CHEK MULTICLIX) lancets Use 3x instructed 200 each 3  . LANTUS SOLOSTAR 100 UNIT/ML Solostar Pen Inject 15-20 Units into the skin daily at 10 pm. 5 pen 11  . lithium carbonate 300 MG capsule TAKE ONE CAPSULE BY MOUTH IN THE MORNING AND TAKE THREE CAPSULES EVERY EVENING 360 capsule 0  . metFORMIN (GLUCOPHAGE-XR) 500 MG 24 hr tablet TAKE FOUR TABLETS BY MOUTH DAILY WITH SUPPER 360 tablet 0  . NOVOLOG FLEXPEN 100 UNIT/ML FlexPen INJECT 20 TO 35 UNITS UNDER THE SKIN BEFORE LUNCH AND DINNER 60 mL 3  . nystatin-triamcinolone ointment (MYCOLOG) Apply to rectal area for internal hemorrhoids twice daily. 30 g 0  . propranolol (INDERAL) 40 MG tablet Take 1 tablet (40 mg total) by mouth 2 (two) times daily as needed. For tremor 180 tablet 1  . triamcinolone (KENALOG) 0.025 % cream APPLY TO AFFECTED AREA TWICE A DAY  2   No current facility-administered medications for this visit.     Medication Side Effects: Other: less tremor  Allergies:  Allergies  Allergen Reactions  . Divalproex Sodium Other (See Comments)    unknown  . Methylphenidate Hcl     Aggravate bipolar  . Oxycodone Hcl     REACTION: hallucinations  . Paroxetine     Aggravate bipolar disorder    Past Medical History:  Diagnosis Date  . 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  Bipolar affective disorder (HCC)   . DM w/o Complication Type II 07/05/2007  . HYPERTRIGLYCERIDEMIA 10/14/2010  . Nocturia 07/04/2010  . PILAR CYST 08/03/2007   Cyst in groin-states comes up when blood sugar gets high. Recurs if cannot walk for a week.     . TOBACCO ABUSE, HX OF 07/31/2009    Family History  Problem Relation Age of Onset  . Diabetes Mother   . Breast cancer Mother        was told not hereditary  . Cirrhosis Mother        fatty liver  . Diabetes Father   . Melanoma Maternal Uncle   . Colon cancer Neg Hx   . Stomach cancer Neg Hx   . Pancreatitis Neg Hx   . Heart disease Neg Hx   . Kidney disease Neg Hx   . Liver disease Neg Hx     Social History   Socioeconomic History  . Marital status: Single    Spouse name: Not on file  . Number of children: Not on file  .  Years of education: Not on file  . Highest education level: Not on file  Occupational History  . Not on file  Social Needs  . Financial resource strain: Not on file  . Food insecurity    Worry: Not on file    Inability: Not on file  . Transportation needs    Medical: Not on file    Non-medical: Not on file  Tobacco Use  . Smoking status: Former Smoker    Packs/day: 2.50    Years: 10.00    Pack years: 25.00    Types: Cigarettes    Quit date: 10/27/2006    Years since quitting: 12.6  . Smokeless tobacco: Never Used  Substance and Sexual Activity  . Alcohol use: No    Alcohol/week: 0.0 standard drinks    Comment: none at all  . Drug use: No  . Sexual activity: Not on file  Lifestyle  . Physical activity    Days per week: Not on file    Minutes per session: Not on file  . Stress: Not on file  Relationships  . Social Musician on phone: Not on file    Gets together: Not on file    Attends religious service: Not on file    Active member of club or organization: Not on file    Attends meetings of clubs or organizations: Not on file    Relationship status: Not on file  . Intimate  partner violence    Fear of current or ex partner: Not on file    Emotionally abused: Not on file    Physically abused: Not on file    Forced sexual activity: Not on file  Other Topics Concern  . Not on file  Social History Narrative   Single. Not dating currently. No kids.    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   Please see review of systems for further details on the patient's review from today.   Objective:   Physical Exam:  There were no vitals taken for this visit.  Physical Exam Neurological:     Mental Status: He is alert and oriented to person, place, and time.     Cranial Nerves: No dysarthria.  Psychiatric:        Attention and Perception: Attention normal.        Mood and Affect: Mood is anxious. Mood is not depressed.        Speech: Speech normal.        Behavior: Behavior is not agitated or hyperactive. Behavior is cooperative.        Thought Content: Thought content is not paranoid or delusional. Thought content does not include homicidal or suicidal ideation. Thought content does not include homicidal or suicidal plan.        Cognition and Memory: Cognition and memory normal.     Comments: No paranoia.  Greatly reduced Obsessing over girl from 20 years ago.  Less anxiety Insight and judgment.     Lab Review:     Component Value Date/Time   NA 140 06/18/2018 1502   K 4.5 06/18/2018 1502   CL 107 06/18/2018 1502   CO2 19 (L) 06/18/2018 1502   GLUCOSE 129 (H) 06/18/2018 1502  BUN 14 06/18/2018 1502   CREATININE 1.24 06/18/2018 1502   CALCIUM 10.3 06/18/2018 1502   PROT 8.3 (H) 06/18/2018 1502   ALBUMIN 4.4 02/10/2017 1631   AST 26 06/18/2018 1502   ALT 24 06/18/2018 1502   ALKPHOS 54 02/10/2017 1631   BILITOT 0.4 06/18/2018 1502    GFRNONAA 84 04/02/2018 1551   GFRAA 97 04/02/2018 1551       Component Value Date/Time   WBC 11.3 (H) 06/27/2016 1441   RBC 4.52 06/27/2016 1441   HGB 13.5 06/27/2016 1441   HCT 39.0 06/27/2016 1441   PLT 248.0 06/27/2016 1441   MCV 86.3 06/27/2016 1441   MCH 29.6 01/28/2014 1250   MCHC 34.5 06/27/2016 1441   RDW 13.2 06/27/2016 1441   LYMPHSABS 1.9 06/27/2016 1441   MONOABS 0.8 06/27/2016 1441   EOSABS 0.2 06/27/2016 1441   BASOSABS 0.1 06/27/2016 1441    Lithium Lvl  Date Value Ref Range Status  07/20/2018 0.7 0.6 - 1.2 mmol/L Final     No results found for: PHENYTOIN, PHENOBARB, VALPROATE, CBMZ   .res Assessment: Plan:    [name] was seen today for anxiety, follow-up and medication problem.  Diagnoses and all orders for this visit:  Schizoaffective disorder, bipolar type (HCC)  Generalized anxiety disorder  Panic disorder with agoraphobia  Tremor due to multiple drugs  Insomnia due to mental condition  Marijuana abuse in remission   [name] has had significant disruptive mood and psychotic symptoms and substance abuse over the course of his treatment here.  It has been evident and chronic occupational dysfunction and some relational problems.  He is also been very prone to EPS making it difficult to get adequate mood stabilization.  He has not had adequate mood stabilization from lithium alone.  Has a history of severe pancreatitis from Depakote.  After several months Is still doing well after having reduced Saphris from 15 mg to 10 mg due to excessive sleepiness.  He is still sleeping adequately at this time and is not reported a significant risk increase in manic symptoms.  There is still some risk of this and he is aware.  Stop Lexapro bc sexual SE and hunger driving up glucose.  Wait to let SE resolve and consider fluoxetine or Viibryd.   Disc SE in detail and SSRI withdrawal sx. have discussed that he may have a relapse of anxiety and he apparently feels  like it has helped his depression.  Discussed potential metabolic side effects associated with atypical antipsychotics, as well as potential risk for movement side effects. Advised pt to contact office if movement side effects occur.   Been EPS prone in the past and tremor prone.  Propranolol is managing the tremor.  He has a low stress tolerance.  He can be easily agitated in public. This is better at present.  He is continued abstinence from marijuana is encouraged.  He has a history of cannabinoid hyperemesis which finally convinced him to discontinue the marijuana.  His mood disorder and thought disorganization have improved since being off the marijuana.  He has a very long history of heavy marijuana dependence.  Get lithium level.  Counseled patient regarding potential benefits, risks, and side effects of lithium to include potential risk of lithium affecting thyroid and renal function.  Discussed need for periodic lab monitoring to determine drug level and to assess for potential adverse effects.  Counseled patient regarding signs and symptoms of lithium toxicity and advised that they notify office immediately or  seek urgent medical attention if experiencing these signs and symptoms.  Patient advised to contact office with any questions or concerns.  Follow-up 2-3 months  Meredith Staggersarey Cottle, MD, D FA PA  Please see After Visit Summary for patient specific instructions.  Future Appointments  Date Time Provider Department Center  06/14/2019  3:15 PM Carlus PavlovGherghe, Cristina, MD [id number] None    No orders of the defined types were placed in this encounter.     -------------------------------

## 2019-06-10 ENCOUNTER — Other Ambulatory Visit: Payer: Self-pay

## 2019-06-11 ENCOUNTER — Other Ambulatory Visit: Payer: Self-pay | Admitting: Psychiatry

## 2019-06-14 ENCOUNTER — Ambulatory Visit (INDEPENDENT_AMBULATORY_CARE_PROVIDER_SITE_OTHER): Payer: BLUE CROSS/BLUE SHIELD | Admitting: Internal Medicine

## 2019-06-14 ENCOUNTER — Other Ambulatory Visit: Payer: Self-pay

## 2019-06-14 ENCOUNTER — Encounter: Payer: Self-pay | Admitting: Internal Medicine

## 2019-06-14 DIAGNOSIS — E1165 Type 2 diabetes mellitus with hyperglycemia: Secondary | ICD-10-CM

## 2019-06-14 LAB — POCT GLYCOSYLATED HEMOGLOBIN (HGB A1C): Hemoglobin A1C: 7.7 % — AB (ref 4.0–5.6)

## 2019-06-14 MED ORDER — LANTUS SOLOSTAR 100 UNIT/ML ~~LOC~~ SOPN: 25.0000 [IU] | PEN_INJECTOR | Freq: Every day | SUBCUTANEOUS | 11 refills | Status: DC

## 2019-06-14 NOTE — Patient Instructions (Addendum)
Please continue: - Metformin 2000 mg once a day - Novolog before meals: 15-30 units 2-3x a day - Lantus 25 units at bedtime  Please stop Jardiance.  Please return in 4 months with your sugar log.

## 2019-06-14 NOTE — Progress Notes (Signed)
Patient ID: Cody DerryVincent P Short, male   DOB: [birth year], 43 y.o.   MRN: [id number]  HPI: Cody Short is a 43 y.o.-year-old male, presenting for follow-up for DM 2, dx in ~2010, insulin-dependent, uncontrolled, without long term complications. Last visit 4 months ago.  Due to the change in insurance, he had to change his PCP and will see [facility].  I am not in his network but he prefers to come to see me.  Because of this, he prefers to come less frequently.  At this visit, he complains of very frequent urination and wonders if this is related to [place].  Recent  urinalysis was normal with the only abnormality glycosuria, expected in the setting of Jardiance use.  Last hemoglobin A1c was: 12/22/2018: HbA1c 7.2% Lab Results  Component Value Date   HGBA1C 6.8 (A) 11/29/2018   HGBA1C 8.1 (A) 04/02/2018   HGBA1C 7.4 12/03/2017   Pt is on a regimen of: - Metformin 2000 mg once a day - Jardiance 25 mg before first meal of the day >> urinates very frequently - Novolog before meals: 15-20 (30) units 2-3x a day - Lantus 15-27 units at bedtime (now 25) He was previously on Lantus but stopped since sugars improved. He tried Glipizide >> hypoglycemia in the 40s repeatedly. Lowest: 25. Also, Glipizide 2.5 mg in am >> nausea, vomiting, constipation We tried Invokana >> bothersome urination (when he was working) >> had to stop. He had pancreatitis 2/2 Depakote and HTG in the past. He stopped Actos b/c stomach pain.  We stopped Cycloset >> inefficient, could not use b/c price.  Pt checks his sugars 4-5 times a day per review of his excellent log: - 5-6 pm: 95-135 >> 111-132 >> 129-152 >> 106-148, 160-202 w/o Lantus - 2h after b'fast:  80-167 >> 123-170 >> 96-200 >> 76-224 - lunch: n/c >> 102-157, 190 - 2h after lunch: 134-147 >> 86-231 - before dinner: n/c >> 132 >> n/c >> 190-220 >> 92-218, 306 - 2h after dinner: 95-271 >> 72-157 >> 84-194 >> n/c >> 95-173 -  bedtime: 116-240 >> 104-166 >> 190-250, 300 >> 84, 122-242, 273 - nighttime:1 111-270, 309 >> 89-181 >> see above Lowest sugar was  72 >> 84 >> 96 >> 76; he has hypoglycemia awareness in the 70s Highest sugar was 195 >> 300 >> 300s.  Glucometer: FreeStyle  -No history of CKD, last BUN/creatinine:  12/22/2018: 15/1.13, GFR 81, glucose 110 Lab Results  Component Value Date   BUN 14 06/18/2018   CREATININE 1.24 06/18/2018   He had elevated ACR: 12/22/2018: 262 Lab Results  Component Value Date   MICRALBCREAT 26.7 04/02/2018   MICRALBCREAT 3.7 02/10/2017   MICRALBCREAT 27.3 12/17/2015   MICRALBCREAT 4.0 02/22/2015   MICRALBCREAT 1.0 01/03/2014   -+ Mixed hyperlipidemia;  last set of lipids: 12/22/2018: 220/359/41/145 Lab Results  Component Value Date   CHOL 197 04/02/2018   HDL 32.70 (L) 04/02/2018   LDLCALC 67 02/02/2014   LDLDIRECT 120.0 04/02/2018   TRIG 341.0 (H) 04/02/2018   CHOLHDL 6 04/02/2018  On fenofibrate.  - last eye exam was on 03/2017: No DR. He established care with another doctor and will have an appointment soon.  -He has numbness but no tingling in his feet  In 2010, he developed pancreatitis from Depakote.  + rash  - scrotal - improved (almost) on Nystatin + Triamcinolone.  He may need refills in the future. He has recurrent groin furunculosis.  He is on  Metamucil for constipation 2/2 Saphris.  Most recent TSH was normal, at 3.648 on 12/22/2018.  ROS: Constitutional: + weight gain/no weight loss, no fatigue, no subjective hyperthermia, no subjective hypothermia, + significant urinary frequency without dysuria Eyes: no blurry vision, no xerophthalmia ENT: no sore throat, no nodules palpated in neck, no dysphagia, no odynophagia, no hoarseness Cardiovascular: no CP/no SOB/no palpitations/no leg swelling Respiratory: no cough/no SOB/no wheezing Gastrointestinal: no N/no V/no D/+ C/no acid reflux Musculoskeletal: no muscle aches/no joint aches Skin: no  rashes, no hair loss Neurological: no tremors/no numbness/no tingling/no dizziness  I reviewed pt's medications, allergies, PMH, social hx, family hx, and changes were documented in the history of present illness. Otherwise, unchanged from my initial visit note.  Past Medical History:  Diagnosis Date  . 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 Bipolar affective disorder (HCC)   . DM w/o Complication Type II 07/05/2007  . HYPERTRIGLYCERIDEMIA 10/14/2010  . Nocturia 07/04/2010  . PILAR CYST 08/03/2007   Cyst in groin-states comes up when blood sugar gets high. Recurs if cannot walk for a week.     . TOBACCO ABUSE, HX OF 07/31/2009   Past Surgical History:  Procedure Laterality Date  . pilar cystectomy     History   Social History  . Marital Status: Single    Spouse Name: N/A  . Number of Children: 0   Occupational History  .    Social History Main Topics  . Smoking status: Former Smoker - quit in 2010  . Smokeless tobacco: Never Used  . Alcohol Use: No     Comment: none at all  . Drug Use: No   Current Outpatient Medications  Medication Sig Dispense Refill  . doxycycline (VIBRAMYCIN) 100 MG capsule     . empagliflozin (JARDIANCE) 25 MG TABS tablet Take 25 mg by mouth daily. 90 tablet 3  . escitalopram (LEXAPRO) 5 MG tablet TAKE 1 TABLET EVERY EVENING 30 tablet 5  . fenofibrate (TRICOR) 145 MG tablet TAKE 1 TABLET BY MOUTH DAILY 90 tablet 1  . glucose blood (FREESTYLE LITE) test strip USE 5 TIMES DAILY 100 each 4  . Insulin Pen Needle (BD PEN NEEDLE NANO U/F) 32G X 4 MM MISC USE 3 TIMES DAILY WITH INSULIN PENS 300 each 3  . ipratropium (ATROVENT) 0.03 % nasal spray Place 2 sprays into both nostrils 3 (three) times daily with meals. 30 mL 11  . Lancets (ACCU-CHEK MULTICLIX) lancets Use 3x instructed 200 each 3  . LANTUS SOLOSTAR 100 UNIT/ML Solostar Pen Inject 15-20 Units into the skin daily at 10 pm. 5 pen 11  . lithium carbonate 300 MG capsule TAKE ONE CAPSULE BY MOUTH IN THE MORNING  AND TAKE THREE CAPSULES EVERY EVENING 360 capsule 0  . metFORMIN (GLUCOPHAGE-XR) 500 MG 24 hr tablet TAKE FOUR TABLETS BY MOUTH DAILY WITH SUPPER 360 tablet 0  . NOVOLOG FLEXPEN 100 UNIT/ML FlexPen INJECT 20 TO 35 UNITS UNDER THE SKIN BEFORE LUNCH AND DINNER 60 mL 3  . nystatin-triamcinolone ointment (MYCOLOG) Apply to rectal area for internal hemorrhoids twice daily. 30 g 0  . propranolol (INDERAL) 40 MG tablet TAKE 1 TABLET BY MOUTH 2 TIMES DAILY AS NEEDED FOR TREMOR. 180 tablet 1  . SAPHRIS 5 MG SUBL 24 hr tablet PLACE 2 TABLETS UNDER THE TONGUE AT BEDTIME 180 tablet 1  . triamcinolone (KENALOG) 0.025 % cream APPLY TO AFFECTED AREA TWICE A DAY  2   No current facility-administered medications for this visit.     No  current facility-administered medications on file prior to visit.    Allergies  Allergen Reactions  . Divalproex Sodium Other (See Comments)    unknown  . Methylphenidate Hcl     Aggravate bipolar  . Oxycodone Hcl     REACTION: hallucinations  . Paroxetine     Aggravate bipolar disorder   Family History  Problem Relation Age of Onset  . Diabetes Mother   . Breast cancer Mother        was told not hereditary  . Cirrhosis Mother        fatty liver  . Diabetes Father   . Melanoma Maternal Uncle   . Colon cancer Neg Hx   . Stomach cancer Neg Hx   . Pancreatitis Neg Hx   . Heart disease Neg Hx   . Kidney disease Neg Hx   . Liver disease Neg Hx    PE: BP 130/90   Pulse 78   Ht 5' 9.25" (1.759 m)   Wt 219 lb (99.3 kg)   SpO2 98%   BMI 32.11 kg/m  Body mass index is 32.11 kg/m. Wt Readings from Last 3 Encounters:  06/14/19 219 lb (99.3 kg)  11/29/18 209 lb (94.8 kg)  07/29/18 209 lb (94.8 kg)   Constitutional: overweight, in NAD Eyes: PERRLA, EOMI, no exophthalmos ENT: moist mucous membranes, no thyromegaly, no cervical lymphadenopathy Cardiovascular: RRR, No MRG Respiratory: CTA B Gastrointestinal: abdomen soft, NT, ND, BS+ Musculoskeletal: no  deformities, strength intact in all 4 Skin: moist, warm, no rashes Neurological: no tremor with outstretched hands, DTR normal in all 4  ASSESSMENT: 1. DM2, insulin-dependent, uncontrolled, without long term complications, but with hyperglycemia  His test were negative for type 1 diabetes: Component     Latest Ref Rng 05/28/2015  C-Peptide     0.80 - 3.90 ng/mL 2.88  Glucose, Fasting     65 - 99 mg/dL 120 (H)  Glutamic Acid Decarb Ab     <5 IU/mL <5  Pancreatic Islet Cell Antibody     < 5 JDF Units <5   - His diabetes is difficult to manage because of multiple intolerances: - We tried to add Invokana but he did not tolerated due to increased urination.  - We cannot use a DPP 4 inhibitor or a GLP-1 receptor agonist due to his history of pancreatitis.  - He had to stop Actos >>  abd. Pain - we stopped Cycloset >> expensive and not effective - we tried Glipizide >> GI sxs: N/V/D  2. HTG  3.  Obesity class I  PLAN:  1. Patient with longstanding, uncontrolled, type 2 diabetes, with improved control after starting Jardiance, improving his diet, and also starting exercise.  Unfortunately, he has intermittent compliance with his diet and exercise and he also has a reverse circadian rhythm- stays upall night and goes to bed in the morning.  When he is consistent with exercise and also following a more plant-based diet, he does not need insulin.  However, at last visit, we had to add back Lantus.  Since then, we also increase NovoLog. -At this visit, we reviewed his log and the sugars appear to be fluctuating, but most of them closer to goal.  He is fluctuating the dose of Lantus and I advised him to keep it at 25 units.  Also, I advised him to use a higher dose of NovoLog whenever he eats a larger meal or 1 that contains more carbs.  Since he is complaining of significant urinary  frequency with Jardiance ("I urinate every 10 to 15 minutes"), unfortunately, for now will need to stop Jardiance.  I  advised him to stay in touch with me if sugars increase. - I advised him to: Patient Instructions  Please continue: - Metformin 2000 mg once a day - Novolog before meals: 15-30 units 2-3x a day - Lantus 25 units at bedtime  Please stop Jardiance.  Please return in 4 months with your sugar log.   - we checked his HbA1c: 7.7% (higher) - advised to check sugars at different times of the day - 3x a day, rotating check times - advised for yearly eye exams >> he is not UTD but has an appointment scheduled - return to clinic in 4 months  - He prefers to come to the clinic on a less frequent basis since the visit are expensive for him (I am out of network).  2. HL -He has a history of hypertriglyceridemia.  We started fenofibrate and also suggested to go back to the plant-based diet.  We discussed about the importance of starting exercise at last visit.  He fluctuates between periods of compliance and noncompliance which hinders his diabetes control. -Latest lipid panel from 11/2018: LDL above target, triglycerides still high -Continues fenofibrate without side effects.  He is not on a statin.  3.  Obesity class I -He gained 10 pounds since last visit -Unfortunately, we also have to stop Jardiance which was helping with weight gain -Continues to have periods of dietary indiscretions and not exercising.  As of now, he is using a more plant-based diet and exercising 7 out of 7 days.  He increased the speed of his running and walking on the treadmill to 3.5 to 4 miles an hour.  We discussed about maybe having 1 day a week of rest.  Cody Pavlovristina Phynix Horton, MD PhD [facility]

## 2019-06-25 ENCOUNTER — Other Ambulatory Visit: Payer: Self-pay | Admitting: Psychiatry

## 2019-07-06 ENCOUNTER — Encounter: Payer: Self-pay | Admitting: Internal Medicine

## 2019-07-06 DIAGNOSIS — E1165 Type 2 diabetes mellitus with hyperglycemia: Secondary | ICD-10-CM

## 2019-07-07 MED ORDER — LANTUS SOLOSTAR 100 UNIT/ML ~~LOC~~ SOPN: 27.0000 [IU] | PEN_INJECTOR | Freq: Every day | SUBCUTANEOUS | 11 refills | Status: DC

## 2019-07-12 ENCOUNTER — Telehealth: Payer: Self-pay

## 2019-07-12 NOTE — Telephone Encounter (Signed)
Called pt and advised he is welcome to come in and pick up a sample of Lantus to help reduce his anxiety of being short on insulin. Verbalized acceptance and understanding. Sample has been labeled and placed in fridge, behind front nurse's station for his pick up.

## 2019-07-12 NOTE — Telephone Encounter (Signed)
Please review and advise. Uncertain if you would like to help pt with a sample??

## 2019-07-15 ENCOUNTER — Encounter: Payer: Self-pay | Admitting: Internal Medicine

## 2019-07-19 ENCOUNTER — Encounter: Payer: Self-pay | Admitting: Internal Medicine

## 2019-07-22 ENCOUNTER — Encounter: Payer: Self-pay | Admitting: Internal Medicine

## 2019-07-25 ENCOUNTER — Other Ambulatory Visit: Payer: Self-pay | Admitting: Internal Medicine

## 2019-07-25 MED ORDER — FREESTYLE LIBRE 2 SENSOR SYSTM MISC: 1.0000 | 3 refills | Status: DC

## 2019-07-25 MED ORDER — FREESTYLE LIBRE 2 READER SYSTM DEVI: 1.0000 | Freq: Once | 0 refills | Status: AC

## 2019-07-26 LAB — LITHIUM LEVEL: Lithium Lvl: 0.7 mmol/L (ref 0.6–1.2)

## 2019-07-27 ENCOUNTER — Telehealth: Payer: Self-pay | Admitting: Psychiatry

## 2019-07-27 ENCOUNTER — Other Ambulatory Visit: Payer: Self-pay | Admitting: Internal Medicine

## 2019-07-27 MED ORDER — FREESTYLE LIBRE 2 READER SYSTM DEVI: 1.0000 | Freq: Once | 0 refills | Status: AC

## 2019-07-27 MED ORDER — FREESTYLE LIBRE 2 SENSOR SYSTM MISC: 1.0000 | 3 refills | Status: DC

## 2019-07-27 NOTE — Telephone Encounter (Signed)
Patient called and left a message about how he is acting maniac and wants to know to know if it is from his lithium level being low.. Please give him a call at [phone]

## 2019-07-27 NOTE — Telephone Encounter (Signed)
Verified with Cody Short he is currently taking 4 lithium per day, advised him to add another capsule 300 mg for a total of 5 per day of lithium 300 mg. Pt verified he understood instructions, instructed him to call back with further problems or concerns.

## 2019-07-27 NOTE — Telephone Encounter (Signed)
His last lithium level was 0.7 which is in the low normal range and is stable from the previous blood level.  There however there is room to increase the level as a normal range goes from 0.6-1.2.  Verify that he is currently taking 4 lithium daily and have him increase by 1 capsule daily or an additional 300 mg daily.  This is probably the safest way to address the manic symptoms given his sensitivity to the atypical antipsychotics that he is also taking.

## 2019-08-04 ENCOUNTER — Encounter: Payer: Self-pay | Admitting: Internal Medicine

## 2019-08-09 ENCOUNTER — Other Ambulatory Visit: Payer: Self-pay

## 2019-08-09 DIAGNOSIS — E1165 Type 2 diabetes mellitus with hyperglycemia: Secondary | ICD-10-CM

## 2019-08-09 MED ORDER — DEXCOM G6 TRANSMITTER MISC: 1.0000 | 0 refills | Status: DC

## 2019-08-09 MED ORDER — DEXCOM G6 SENSOR MISC: 1.0000 | 2 refills | Status: DC

## 2019-08-09 MED ORDER — DEXCOM G6 RECEIVER DEVI: 1.0000 | 0 refills | Status: DC

## 2019-08-09 MED ORDER — LANTUS SOLOSTAR 100 UNIT/ML ~~LOC~~ SOPN: 27.0000 [IU] | PEN_INJECTOR | Freq: Every day | SUBCUTANEOUS | 11 refills | Status: DC

## 2019-08-23 ENCOUNTER — Encounter: Payer: Self-pay | Admitting: Internal Medicine

## 2019-08-25 ENCOUNTER — Other Ambulatory Visit: Payer: Self-pay

## 2019-08-25 DIAGNOSIS — E1165 Type 2 diabetes mellitus with hyperglycemia: Secondary | ICD-10-CM

## 2019-08-25 MED ORDER — METFORMIN HCL ER 500 MG PO TB24: 2000.0000 mg | ORAL_TABLET | Freq: Every day | ORAL | 0 refills | Status: DC

## 2019-08-29 ENCOUNTER — Other Ambulatory Visit: Payer: Self-pay

## 2019-08-29 DIAGNOSIS — E1165 Type 2 diabetes mellitus with hyperglycemia: Secondary | ICD-10-CM

## 2019-08-29 MED ORDER — METFORMIN HCL ER 500 MG PO TB24: 2000.0000 mg | ORAL_TABLET | Freq: Every day | ORAL | 2 refills | Status: DC

## 2019-08-31 ENCOUNTER — Telehealth: Payer: Self-pay | Admitting: Psychiatry

## 2019-08-31 ENCOUNTER — Encounter: Payer: Self-pay | Admitting: Internal Medicine

## 2019-08-31 DIAGNOSIS — E1165 Type 2 diabetes mellitus with hyperglycemia: Secondary | ICD-10-CM

## 2019-08-31 NOTE — Telephone Encounter (Signed)
Pt stated his hands are shaking more since he increased his Lithium. This was done per CC he stated. Please advise what measures we can take for this occurrence.

## 2019-09-01 ENCOUNTER — Other Ambulatory Visit: Payer: Self-pay | Admitting: Psychiatry

## 2019-09-01 DIAGNOSIS — F250 Schizoaffective disorder, bipolar type: Secondary | ICD-10-CM

## 2019-09-01 NOTE — Telephone Encounter (Signed)
Lithium lab request sent to [facility]

## 2019-09-01 NOTE — Telephone Encounter (Signed)
Cody Short will go to get his labs as soon as he can, advised him we would follow up as soon as we received results. He agrees

## 2019-09-01 NOTE — Telephone Encounter (Signed)
Patient had asked to increase his lithium to 5 of the 300 mg capsules daily because of manic symptoms he noted in October.  His tremor is worse.  His previous lithium level was 0.7 on 1200 mg daily which is in the low normal range.  We should repeat the lithium level to verify that it is in the correct normal range.  If it is then we could consider propranolol again for the tremor.  Please have him get a lithium level at his earliest convenience.

## 2019-09-05 MED ORDER — FREESTYLE LITE TEST VI STRP: ORAL_STRIP | 4 refills | Status: DC

## 2019-09-06 ENCOUNTER — Ambulatory Visit: Payer: BLUE CROSS/BLUE SHIELD | Admitting: Psychiatry

## 2019-09-09 ENCOUNTER — Other Ambulatory Visit: Payer: Self-pay | Admitting: Psychiatry

## 2019-09-10 LAB — LITHIUM LEVEL: Lithium Lvl: 1.1 mmol/L (ref 0.6–1.2)

## 2019-09-13 ENCOUNTER — Other Ambulatory Visit: Payer: Self-pay | Admitting: Psychiatry

## 2019-09-13 ENCOUNTER — Telehealth: Payer: Self-pay | Admitting: Psychiatry

## 2019-09-13 NOTE — Telephone Encounter (Signed)
Left voice mail to call back 

## 2019-09-13 NOTE — Telephone Encounter (Signed)
Pt called to check on his lith level and I told him it was normal. Continues to have shaking in hands even with taking the medication to help that. It is a little bit better.Should he increase Propranolol or give it more time?

## 2019-09-13 NOTE — Telephone Encounter (Signed)
Pt was given the information. He was only taking propranolol 40mg  one time per day, so he will increase that to two times a day. This may help enough. I gave him the vit B6 info too.

## 2019-09-13 NOTE — Telephone Encounter (Signed)
He can continue the propranolol 40 mg twice a day as needed for tremor.  I do not recall whether he is tried B6 500 mg twice a day for tremor.  If is not done that he should try that and he needs to be consistent with that and not take it just as needed.  He can take both the propranolol and the B6

## 2019-09-15 NOTE — Telephone Encounter (Signed)
Noted  

## 2019-09-20 ENCOUNTER — Ambulatory Visit (INDEPENDENT_AMBULATORY_CARE_PROVIDER_SITE_OTHER): Payer: BLUE CROSS/BLUE SHIELD | Admitting: Psychiatry

## 2019-09-20 ENCOUNTER — Other Ambulatory Visit: Payer: Self-pay

## 2019-09-20 ENCOUNTER — Encounter: Payer: Self-pay | Admitting: Psychiatry

## 2019-09-20 DIAGNOSIS — G251 Drug-induced tremor: Secondary | ICD-10-CM

## 2019-09-20 DIAGNOSIS — F250 Schizoaffective disorder, bipolar type: Secondary | ICD-10-CM

## 2019-09-20 DIAGNOSIS — F5105 Insomnia due to other mental disorder: Secondary | ICD-10-CM

## 2019-09-20 DIAGNOSIS — F411 Generalized anxiety disorder: Secondary | ICD-10-CM | POA: Diagnosis not present

## 2019-09-20 DIAGNOSIS — Z79899 Other long term (current) drug therapy: Secondary | ICD-10-CM

## 2019-09-20 DIAGNOSIS — F4001 Agoraphobia with panic disorder: Secondary | ICD-10-CM | POA: Diagnosis not present

## 2019-09-20 DIAGNOSIS — F1211 Cannabis abuse, in remission: Secondary | ICD-10-CM

## 2019-09-20 NOTE — Progress Notes (Signed)
Cody Dawood [place]ount [id number] 1976-02-12 43 y.o.   Virtual Visit via Telephone Note  I connected with pt by telephone and verified that I am speaking with the correct person using two identifiers.   I discussed the limitations, risks, security and privacy concerns of performing an evaluation and management service by telephone and the availability of in person appointments. I also discussed with the patient that there may be a patient responsible charge related to this service. The patient expressed understanding and agreed to proceed.  I discussed the assessment and treatment plan with the patient. The patient was provided an opportunity to ask questions and all were answered. The patient agreed with the plan and demonstrated an understanding of the instructions.   The patient was advised to call back or seek an in-person evaluation if the symptoms worsen or if the condition fails to improve as anticipated.  I provided 30 minutes of non-face-to-face time during this encounter. The call started at 330 and ended at 4:00. The patient was located at home and the provider was located office.   Subjective:   Patient ID:  Cody Short is a 43 y.o. (DOB 10-02-1976) male.  Chief Complaint:  Chief Complaint  Patient presents with  . Follow-up    Medication Mangement  . Depression    Medication Mangement  . Other    Schizoaffective disorder, bipolar type  . Medication Problem    Tremor     Shalev P Yarrow presents to the office today for follow-up of several psychiatric diagnoses ...    Last seen August 2020.  For obsessive anxiety  Switched Lexapro 5 mg daily on May 18.  But he stated it made him hungry and so it was stopped in August.  He continued on Saphris 10 mg nightly, lithium 1200 mg daily, propranolol as needed tremor.  Lithium level was ordered.  He called Sept saying he was manic.  Had a lithium level 0.7 and so dose was increased to 1500 mg daily.     Less obsessed about the old GF 20 years ago after wrote someone on FB recently and thinks he's a lot better with it now. Another friend says he complains a lot and talks about himself too much.  Over his life he's had 5 girls that he's obsessed over. No other obsessions.   Overall done really well with moods since the increase in lithium from 1200 to 1500 mg daily.  The tremor is manageable..   No longer excessively drowsy.  Occ conflict with people but manageable.  Less fear of driving and doing it more.  Patient reports stable mood and deniesr irritable moods.  Patient reports some recent difficulty with anxiety with GM living with them and she's difficult and he is also somewhat fearful of Covid for the sake of his family..  Patient denies difficulty with sleep initiation or maintenance; 6-7hours. Delays sleep phase chronically.  Denies appetite disturbance.  Patient reports that energy and motivation have been good.  Patient denies any difficulty with concentration.  Patient denies any suicidal ideation.  Admits he's easily stressed.  Works with [employer].  Prior psychiatric medications: risperidone with cognitive side effects, Abilify,  Zyprexa, Seroquel 600 mg, perphenazine, Saphris 20 , haloperidol, Geodon, Saphris, Vraylar.   He's prone to EPS. Depakote (Note patient had severe pancreatitis from Depakote.), Equetro, N-acetylcysteine with minimal benefit, , Amantadine with vomiting.  Sertraline and lexapro sexual SE,   Propranolol for tremor, lithium 1500,   Review of Systems:  Review of Systems  Neurological: Positive for tremors. Negative for weakness.  Psychiatric/Behavioral: Positive for decreased concentration. Negative for agitation, behavioral problems, confusion, dysphoric mood, hallucinations, self-injury, sleep disturbance and suicidal ideas. The patient is nervous/anxious. The patient is not hyperactive.     Medications: I have reviewed the patient's current  medications.  Current Outpatient Medications  Medication Sig Dispense Refill  . Continuous Blood Gluc Receiver (DEXCOM G6 RECEIVER) DEVI 1 Device by Does not apply route See admin instructions. Use to check glucose 1 Device 0  . Continuous Blood Gluc Sensor (DEXCOM G6 SENSOR) MISC 1 each by Does not apply route See admin instructions. Apply sensor every 10 days to check glucose 9 each 2  . Continuous Blood Gluc Transmit (DEXCOM G6 TRANSMITTER) MISC 1 each by Does not apply route See admin instructions. Use to check glucose 1 each 0  . fenofibrate (TRICOR) 145 MG tablet TAKE 1 TABLET BY MOUTH DAILY 90 tablet 1  . glucose blood (FREESTYLE LITE) test strip Use to check blood sugar once a day 100 each 4  . Insulin Pen Needle (BD PEN NEEDLE NANO U/F) 32G X 4 MM MISC USE 3 TIMES DAILY WITH INSULIN PENS 300 each 3  . ipratropium (ATROVENT) 0.03 % nasal spray Place 2 sprays into both nostrils 3 (three) times daily with meals. 30 mL 11  . Lancets (ACCU-CHEK MULTICLIX) lancets Use 3x instructed 200 each 3  . LANTUS SOLOSTAR 100 UNIT/ML Solostar Pen Inject 27 Units into the skin daily at 10 pm. 10 pen 11  . lithium carbonate 300 MG capsule TAKE ONE CAPSULE BY MOUTH IN THE MORNING AND TAKE THREE CAPSULES EVERY EVENING (Patient taking differently: TAKE 2 CAPSULE BY MOUTH IN THE MORNING AND TAKE 3 CAPSULES EVERY EVENING) 360 capsule 1  . metFORMIN (GLUCOPHAGE-XR) 500 MG 24 hr tablet Take 4 tablets (2,000 mg total) by mouth daily with supper. 360 tablet 2  . NOVOLOG FLEXPEN 100 UNIT/ML FlexPen INJECT 20 TO 35 UNITS UNDER THE SKIN BEFORE LUNCH AND DINNER 60 mL 3  . nystatin-triamcinolone ointment (MYCOLOG) Apply to rectal area for internal hemorrhoids twice daily. 30 g 0  . propranolol (INDERAL) 40 MG tablet TAKE 1 TABLET BY MOUTH 2 TIMES DAILY AS NEEDED FOR TREMOR. 180 tablet 1  . SAPHRIS 5 MG SUBL 24 hr tablet PLACE 2 TABLETS UNDER THE TONGUE AT BEDTIME 180 tablet 1  . triamcinolone (KENALOG) 0.025 % cream  APPLY TO AFFECTED AREA TWICE A DAY  2   No current facility-administered medications for this visit.     Medication Side Effects: Other: less tremor  Allergies:  Allergies  Allergen Reactions  . Divalproex Sodium Other (See Comments)    unknown  . Methylphenidate Hcl     Aggravate bipolar  . Oxycodone Hcl     REACTION: hallucinations  . Paroxetine     Aggravate bipolar disorder    Past Medical History:  Diagnosis Date  . 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 Bipolar affective disorder (HCC)   . DM w/o Complication Type II 07/05/2007  . HYPERTRIGLYCERIDEMIA 10/14/2010  . Nocturia 07/04/2010  . PILAR CYST 08/03/2007   Cyst in groin-states comes up when blood sugar gets high. Recurs if cannot walk for a week.     . TOBACCO ABUSE, HX OF 07/31/2009    Family History  Problem Relation Age of Onset  . Diabetes Mother   . Breast cancer Mother        was told not hereditary  . Cirrhosis Mother  fatty liver  . Diabetes Father   . Melanoma Maternal Uncle   . Colon cancer Neg Hx   . Stomach cancer Neg Hx   . Pancreatitis Neg Hx   . Heart disease Neg Hx   . Kidney disease Neg Hx   . Liver disease Neg Hx     Social History   Socioeconomic History  . Marital status: Single    Spouse name: Not on file  . Number of children: Not on file  . Years of education: Not on file  . Highest education level: Not on file  Occupational History  . Not on file  Social Needs  . Financial resource strain: Not on file  . Food insecurity    Worry: Not on file    Inability: Not on file  . Transportation needs    Medical: Not on file    Non-medical: Not on file  Tobacco Use  . Smoking status: Former Smoker    Packs/day: 2.50    Years: 10.00    Pack years: 25.00    Types: Cigarettes    Quit date: 10/27/2006    Years since quitting: 12.9  . Smokeless tobacco: Never Used  Substance and Sexual Activity  . Alcohol use: No    Alcohol/week: 0.0 standard drinks    Comment: none at all  .  Drug use: No  . Sexual activity: Not on file  Lifestyle  . Physical activity    Days per week: Not on file    Minutes per session: Not on file  . Stress: Not on file  Relationships  . Social Musician on phone: Not on file    Gets together: Not on file    Attends religious service: Not on file    Active member of club or organization: Not on file    Attends meetings of clubs or organizations: Not on file    Relationship status: Not on file  . Intimate partner violence    Fear of current or ex partner: Not on file    Emotionally abused: Not on file    Physically abused: Not on file    Forced sexual activity: Not on file  Other Topics Concern  . Not on file  Social History Narrative   Single. Not dating currently. No kids.    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   Please see review of systems for further details on the patient's review from today.   Objective:   Physical Exam:  There were no vitals taken for this visit.  Physical Exam Neurological:     Mental Status: He is alert and oriented to person, place, and time.     Cranial Nerves: No dysarthria.  Psychiatric:        Attention and Perception: Attention normal.        Mood and Affect: Mood is anxious. Mood is not depressed.        Speech: Speech normal.        Behavior: Behavior is not agitated or hyperactive. Behavior is cooperative.        Thought Content: Thought content is not paranoid or delusional. Thought content does not include homicidal or suicidal ideation. Thought content does not include  homicidal or suicidal plan.        Cognition and Memory: Cognition and memory normal.     Comments: No paranoia.  Greatly reduced Obsessing over girl from 20 years ago.  Less  anxiety Insight and judgment fair. Chronically self-preoccupied and hyperthymic style     Lab Review:     Component Value Date/Time   NA 140 06/18/2018 1502   K 4.5 06/18/2018 1502   CL 107 06/18/2018 1502   CO2 19 (L) 06/18/2018 1502   GLUCOSE 129 (H) 06/18/2018 1502   BUN 14 06/18/2018 1502   CREATININE 1.24 06/18/2018 1502   CALCIUM 10.3 06/18/2018 1502   PROT 8.3 (H) 06/18/2018 1502   ALBUMIN 4.4 02/10/2017 1631   AST 26 06/18/2018 1502   ALT 24 06/18/2018 1502   ALKPHOS 54 02/10/2017 1631   BILITOT 0.4 06/18/2018 1502   GFRNONAA 84 04/02/2018 1551   GFRAA 97 04/02/2018 1551       Component Value Date/Time   WBC 11.3 (H) 06/27/2016 1441   RBC 4.52 06/27/2016 1441   HGB 13.5 06/27/2016 1441   HCT 39.0 06/27/2016 1441   PLT 248.0 06/27/2016 1441   MCV 86.3 06/27/2016 1441   MCH 29.6 01/28/2014 1250   MCHC 34.5 06/27/2016 1441   RDW 13.2 06/27/2016 1441   LYMPHSABS 1.9 06/27/2016 1441   MONOABS 0.8 06/27/2016 1441   EOSABS 0.2 06/27/2016 1441   BASOSABS 0.1 06/27/2016 1441    Lithium Lvl  Date Value Ref Range Status  09/09/2019 1.1 0.6 - 1.2 mmol/L Final     No results found for: PHENYTOIN, PHENOBARB, VALPROATE, CBMZ   .res Assessment: Plan:    Naseer was seen today for follow-up, depression, other and medication problem.  Diagnoses and all orders for this visit:  Schizoaffective disorder, bipolar type ([place]) -     Basic metabolic panel -     Lithium level  Generalized anxiety disorder  Panic disorder with agoraphobia  Tremor due to multiple drugs  Insomnia due to mental condition  Marijuana abuse in remission  Lithium use -     Basic metabolic panel -     Lithium level   [name] has had significant disruptive mood and psychotic symptoms and substance abuse over the course of his treatment here.  It has been evident and chronic occupational dysfunction and some relational problems.  He is also been very prone to EPS making it difficult to get  adequate mood stabilization.  He has not had adequate mood stabilization from lithium alone but his recent manic symptoms did improve with the increase in lithium from 1200 to 1500 mg daily.Marland Kitchen  Has a history of severe pancreatitis from Depakote.  After several months Is still doing well after having reduced Saphris from 15 mg to 10 mg due to excessive sleepiness.  He is still sleeping adequately at this time and is not reported a significant risk increase in manic symptoms.  There is still some risk of this and he is aware.  consider fluoxetine or Viibryd for his history of obsessional anxiety if absolutely necessary but would prefer to avoid this because of risk of triggering mania.  Overall he feels he is better with his anxiety than at his last appointment..    Discussed potential metabolic side effects associated with atypical antipsychotics, as well as potential risk for movement side effects. Advised pt to contact office if movement side effects occur.   Been EPS prone in the past and tremor prone.  Propranolol  is managing the tremor.  He has a low stress tolerance.  He can be easily agitated in public. This is better at present.  He has continued abstinence from marijuana is encouraged.  He has a history of cannabinoid hyperemesis which finally convinced him to discontinue the marijuana.  His mood disorder and thought disorganization have improved since being off the marijuana.  He has a very long history of heavy marijuana dependence.  Lithium level 1.1 on 1500 mg daily.  He needs a high normal blood level to maintain mood stability.  Counseled patient regarding potential benefits, risks, and side effects of lithium to include potential risk of lithium affecting thyroid and renal function.  Discussed need for periodic lab monitoring to determine drug level and to assess for potential adverse effects.  Counseled patient regarding signs and symptoms of lithium toxicity and advised that they  notify office immediately or seek urgent medical attention if experiencing these signs and symptoms.  Patient advised to contact office with any questions or concerns.  Repeat lithium level before next appt.    Disc therapeutic letter writing as a way of finding resolution of sense of lack of closure over lost love of 20 years ago.  Follow-up 2-3 months.  Pt has some chronic instability.  Meredith Staggers, MD, D FA PA  Please see After Visit Summary for patient specific instructions.  Future Appointments  Date Time Provider Department Center  10/10/2019  3:15 PM Carlus Pavlov, MD [id number] None  12/21/2019  3:00 PM Cottle, Steva Ready., MD [facility] None    Orders Placed This Encounter  Procedures  . Basic metabolic panel  . Lithium level      -------------------------------

## 2019-10-06 ENCOUNTER — Encounter: Payer: Self-pay | Admitting: Internal Medicine

## 2019-10-07 ENCOUNTER — Other Ambulatory Visit: Payer: Self-pay | Admitting: Internal Medicine

## 2019-10-07 MED ORDER — FENOFIBRATE 145 MG PO TABS: 145.0000 mg | ORAL_TABLET | Freq: Every day | ORAL | 3 refills | Status: DC

## 2019-10-10 ENCOUNTER — Ambulatory Visit: Payer: BLUE CROSS/BLUE SHIELD | Admitting: Internal Medicine

## 2019-10-10 ENCOUNTER — Ambulatory Visit (INDEPENDENT_AMBULATORY_CARE_PROVIDER_SITE_OTHER): Payer: BLUE CROSS/BLUE SHIELD | Admitting: Internal Medicine

## 2019-10-10 ENCOUNTER — Encounter: Payer: Self-pay | Admitting: Internal Medicine

## 2019-10-10 ENCOUNTER — Other Ambulatory Visit: Payer: Self-pay

## 2019-10-10 DIAGNOSIS — E781 Pure hyperglyceridemia: Secondary | ICD-10-CM | POA: Diagnosis not present

## 2019-10-10 DIAGNOSIS — E669 Obesity, unspecified: Secondary | ICD-10-CM | POA: Diagnosis not present

## 2019-10-10 DIAGNOSIS — E1165 Type 2 diabetes mellitus with hyperglycemia: Secondary | ICD-10-CM | POA: Diagnosis not present

## 2019-10-10 MED ORDER — FREESTYLE LITE TEST VI STRP: ORAL_STRIP | 3 refills | Status: DC

## 2019-10-10 NOTE — Patient Instructions (Addendum)
Please continue: - Metformin ER 2000 mg but move this with the last meal of the day  Change: - Novolog before meals: 10-20 units 2-3x a day - Lantus 25 units at bedtime  Please return in 3 to 4 months with your sugar log

## 2019-10-10 NOTE — Progress Notes (Signed)
Patient ID: Cody Short, male   DOB: 17-Apr-1976, 43 y.o.   MRN: [id number]  Patient location: Home My location: Office Persons participating in the virtual visit: patient, provider  Referring Provider: Cipriano Bunker, MD  I connected with the patient on 10/10/19 at  3:36 PM EST by a video enabled telemedicine application and verified that I am speaking with the correct person.   I discussed the limitations of evaluation and management by telemedicine and the availability of in person appointments. The patient expressed understanding and agreed to proceed.   Details of the encounter are shown below.  HPI: Cody Short is a 43 y.o.-year-old male, presenting for follow-up for DM 2, dx in ~2010, insulin-dependent, uncontrolled, without long term complications. Last visit 4 months ago.  Started a CGM since last OV >> erroneous readings! Stopped it 2-3 mo ago.  Reviewed HbA1c levels: Lab Results  Component Value Date   HGBA1C 7.7 (A) 06/14/2019   HGBA1C 6.8 (A) 11/29/2018   HGBA1C 8.1 (A) 04/02/2018  12/22/2018: HbA1c 7.2%  Pt is on a regimen of: - Metformin ER 2000 mg once a day at waking up - Novolog before meals: 20-25 units 2-3x a day - Lantus 10-15 (25) units at bedtime We had to stop Jardiance due to increased urination. He was previously on Lantus but stopped since sugars improved. He tried Glipizide >> hypoglycemia in the 40s repeatedly. Lowest: 25. Also, Glipizide 2.5 mg in am >> nausea, vomiting, constipation We tried Invokana >> bothersome urination (when he was working) >> had to stop. He had pancreatitis 2/2 Depakote and HTG in the past. He stopped Actos b/c stomach pain.  We stopped Cycloset >> inefficient, could not use b/c price.  Pt checks his sugars 4x a day: - 5-6 pm:  106-148, 160-202 w/o Lantus >> n/c - 2h after b'fast: 123-170 >> 96-200 >> 76-224  >> n/c - lunch: n/c >> 102-157, 190 >> 130-160, 223 - 2h after lunch:86-231 >> 72-220 (ave:  120-150) - before dinner:  190-220 >> 92-218, 306 >> n/c - 2h after dinner:  95-173 >> 108-242 (ave 150-160) - bedtime:  190-250, 300 >> 84, 122-242, 273 >> 115-182 - nighttime:1 111-270, 309 >> 89-181 >> see above Lowest sugar was  96 >> 76 >> 72; he has hypoglycemia awareness in the 70s. Highest sugar was  300 >> 300s >> 242.  Glucometer: FreeStyle  -No history of CKD, last BUN/creatinine:  12/22/2018: 15/1.13, GFR 81, glucose 110 Lab Results  Component Value Date   BUN 14 06/18/2018   CREATININE 1.24 06/18/2018   He had elevated ACR: 12/22/2018: 262 Lab Results  Component Value Date   MICRALBCREAT 26.7 04/02/2018   MICRALBCREAT 3.7 02/10/2017   MICRALBCREAT 27.3 12/17/2015   MICRALBCREAT 4.0 02/22/2015   MICRALBCREAT 1.0 01/03/2014   -+ Mixed hyperlipidemia;  last set of lipids: 12/22/2018: 220/359/41/145 Lab Results  Component Value Date   CHOL 197 04/02/2018   HDL 32.70 (L) 04/02/2018   LDLCALC 67 02/02/2014   LDLDIRECT 120.0 04/02/2018   TRIG 341.0 (H) 04/02/2018   CHOLHDL 6 04/02/2018  On fenofibrate.  - last eye exam was on 03/2017: No DR. He will have another appt in the new year.  -+ Numbness but no tingling in his feet  In 2010, he developed pancreatitis from Depakote.  + rash  - scrotal - improved (almost) on Nystatin + Triamcinolone.  He may need refills in the future. He has recurrent groin furunculosis.  He is on Metamucil for  constipation 2/2 Saphris.  Most recent TSH was normal, at 3.648 on 12/22/2018.  He exercises 1h-1.5h every night: 2.5-3.0 mph, also lifting weights.  ROS: Constitutional: no weight gain/no weight loss, no fatigue, no subjective hyperthermia, no subjective hypothermia Eyes: no blurry vision, no xerophthalmia ENT: no sore throat, no nodules palpated in neck, no dysphagia, no odynophagia, no hoarseness Cardiovascular: no CP/no SOB/no palpitations/no leg swelling Respiratory: no cough/no SOB/no wheezing Gastrointestinal: no N/no  V/no D/no C/no acid reflux Musculoskeletal: no muscle aches/no joint aches Skin: no rashes, no hair loss Neurological: no tremors/+ numbness/no tingling/no dizziness  I reviewed pt's medications, allergies, PMH, social hx, family hx, and changes were documented in the history of present illness. Otherwise, unchanged from my initial visit note.  Past Medical History:  Diagnosis Date  . 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 Bipolar affective disorder (HCC)   . DM w/o Complication Type II 07/05/2007  . HYPERTRIGLYCERIDEMIA 10/14/2010  . Nocturia 07/04/2010  . PILAR CYST 08/03/2007   Cyst in groin-states comes up when blood sugar gets high. Recurs if cannot walk for a week.     . TOBACCO ABUSE, HX OF 07/31/2009   Past Surgical History:  Procedure Laterality Date  . pilar cystectomy     History   Social History  . Marital Status: Single    Spouse Name: N/A  . Number of Children: 0   Occupational History  .    Social History Main Topics  . Smoking status: Former Smoker - quit in 2010  . Smokeless tobacco: Never Used  . Alcohol Use: No     Comment: none at all  . Drug Use: No   Current Outpatient Medications  Medication Sig Dispense Refill  . Continuous Blood Gluc Receiver (DEXCOM G6 RECEIVER) DEVI 1 Device by Does not apply route See admin instructions. Use to check glucose 1 Device 0  . Continuous Blood Gluc Sensor (DEXCOM G6 SENSOR) MISC 1 each by Does not apply route See admin instructions. Apply sensor every 10 days to check glucose 9 each 2  . Continuous Blood Gluc Transmit (DEXCOM G6 TRANSMITTER) MISC 1 each by Does not apply route See admin instructions. Use to check glucose 1 each 0  . fenofibrate (TRICOR) 145 MG tablet Take 1 tablet (145 mg total) by mouth daily. 90 tablet 3  . glucose blood (FREESTYLE LITE) test strip Use to check blood sugar once a day 100 each 4  . Insulin Pen Needle (BD PEN NEEDLE NANO U/F) 32G X 4 MM MISC USE 3 TIMES DAILY WITH INSULIN PENS 300 each 3  .  ipratropium (ATROVENT) 0.03 % nasal spray Place 2 sprays into both nostrils 3 (three) times daily with meals. 30 mL 11  . Lancets (ACCU-CHEK MULTICLIX) lancets Use 3x instructed 200 each 3  . LANTUS SOLOSTAR 100 UNIT/ML Solostar Pen Inject 27 Units into the skin daily at 10 pm. 10 pen 11  . lithium carbonate 300 MG capsule TAKE ONE CAPSULE BY MOUTH IN THE MORNING AND TAKE THREE CAPSULES EVERY EVENING (Patient taking differently: TAKE 2 CAPSULE BY MOUTH IN THE MORNING AND TAKE 3 CAPSULES EVERY EVENING) 360 capsule 1  . metFORMIN (GLUCOPHAGE-XR) 500 MG 24 hr tablet Take 4 tablets (2,000 mg total) by mouth daily with supper. 360 tablet 2  . NOVOLOG FLEXPEN 100 UNIT/ML FlexPen INJECT 20 TO 35 UNITS UNDER THE SKIN BEFORE LUNCH AND DINNER 60 mL 3  . nystatin-triamcinolone ointment (MYCOLOG) Apply to rectal area for internal hemorrhoids twice daily. 30 g  0  . propranolol (INDERAL) 40 MG tablet TAKE 1 TABLET BY MOUTH 2 TIMES DAILY AS NEEDED FOR TREMOR. 180 tablet 1  . SAPHRIS 5 MG SUBL 24 hr tablet PLACE 2 TABLETS UNDER THE TONGUE AT BEDTIME 180 tablet 1  . triamcinolone (KENALOG) 0.025 % cream APPLY TO AFFECTED AREA TWICE A DAY  2   No current facility-administered medications for this visit.    No current facility-administered medications on file prior to visit.    Allergies  Allergen Reactions  . Divalproex Sodium Other (See Comments)    unknown  . Methylphenidate Hcl     Aggravate bipolar  . Oxycodone Hcl     REACTION: hallucinations  . Paroxetine     Aggravate bipolar disorder   Family History  Problem Relation Age of Onset  . Diabetes Mother   . Breast cancer Mother        was told not hereditary  . Cirrhosis Mother        fatty liver  . Diabetes Father   . Melanoma Maternal Uncle   . Colon cancer Neg Hx   . Stomach cancer Neg Hx   . Pancreatitis Neg Hx   . Heart disease Neg Hx   . Kidney disease Neg Hx   . Liver disease Neg Hx    PE: There were no vitals taken for this  visit. There is no height or weight on file to calculate BMI. Wt Readings from Last 3 Encounters:  06/14/19 219 lb (99.3 kg)  11/29/18 209 lb (94.8 kg)  07/29/18 209 lb (94.8 kg)   Constitutional:  in NAD  The physical exam was not performed (virtual visit).  ASSESSMENT: 1. DM2, insulin-dependent, uncontrolled, without long term complications, but with hyperglycemia  His test were negative for type 1 diabetes: Component     Latest Ref Rng 05/28/2015  C-Peptide     0.80 - 3.90 ng/mL 2.88  Glucose, Fasting     65 - 99 mg/dL 161120 (H)  Glutamic Acid Decarb Ab     <5 IU/mL <5  Pancreatic Islet Cell Antibody     < 5 JDF Units <5   - His diabetes is difficult to manage because of multiple intolerances: - We tried to add Invokana but he did not tolerated due to increased urination.  - We cannot use a DPP 4 inhibitor or a GLP-1 receptor agonist due to his history of pancreatitis.  - He had to stop Actos >>  abd. Pain - we stopped Cycloset >> expensive and not effective - we tried Glipizide >> GI sxs: N/V/D  2. HTG  3.  Obesity class I  PLAN:  1. Patient withLongstanding, uncontrolled, type 2 diabetes, with improved control after starting Jardiance (however, we had to stop this at last visit due to very frequent urination), improving his diet and also starting exercise.  He has intermittent compliance with diet and exercise and also reports circadian rhythm-stays up at night and goes to bed in the morning.  Therefore, his diabetes control is fluctuating.   -At this visit, he tells me that he is switching towards a more daytime circadian rhythm, which I think will help.  He also started to exercise consistently, every night, on the treadmill, and also lifting weights.  He is continuing to work on his diet. -He is currently on a regimen with containing a low dose of Lantus and a high dose of NovoLog.  His sugars are variable, without consistent patterns. - we discussed about trying  to  equalize 2 total doses of Lantus and NovoLog per day.  Therefore, for now, we will increase the Lantus dose and decrease the doses of NovoLog to reduce variability in his blood sugars.  Also, since he is having diarrhea by taking Metformin with the first meal of his day, we will move these with the last meal of his day and if he still has diarrhea, will split the dose back to 1000 mg twice a day. -We started the CGM after last visit but unfortunately the freestyle libre CGM was not covered and the Dexcom G6 CGM gave him a lot of errors despite changing many sensors so he is now again checking his sugars manually.  Refilled his strips. - I advised him to: Patient Instructions  Please continue: - Metformin ER 2000 mg but move this with the last meal of the day  Change: - Novolog before meals: 10-20 units 2-3x a day - Lantus 25 units at bedtime  Please return in 3 to 4 months with your sugar log  - we will check his HbA1c when he returns to the clinic - advised to check sugars at different times of the day - 4x a day, rotating check times - advised for yearly eye exams >> he is not UTD - return to clinic in 3-4 months  2. HL - he has a h/o HTG -We started fenofibrate and also suggested to go back to the plant-based diet.  I also explained the importance of exercise.  He fluctuates between periods of compliance and noncompliance with his diet and exercise, but has been exercising lately. - Reviewed latest lipid panel from 11/2018: LDL above target, triglycerides still high - He continues fenofibrate without side effects.    3.  Obesity class I -Unfortunately, we had to stop Jardiance, which was also helping with weight gain, due to increased urination -Continue Metformin which is appetite reducing and weight stabilizing long-term -He is back to exercising consistently and started to see a decrease in his weight.   Philemon Kingdom, MD PhD [facility] End[facility]

## 2019-10-12 ENCOUNTER — Encounter: Payer: Self-pay | Admitting: Internal Medicine

## 2019-10-25 ENCOUNTER — Telehealth: Payer: Self-pay | Admitting: Psychiatry

## 2019-10-25 NOTE — Telephone Encounter (Signed)
Pt stated he doesn't remember advise for his tremors/jerking. Please advise.

## 2019-10-26 NOTE — Telephone Encounter (Signed)
Rtc to patient and reminded him to take Propranolol 40 mg bid and to add B6 500 mg bid. Patient verbalized understanding.

## 2019-11-06 ENCOUNTER — Encounter: Payer: Self-pay | Admitting: Internal Medicine

## 2019-11-12 ENCOUNTER — Encounter: Payer: Self-pay | Admitting: Internal Medicine

## 2019-11-12 DIAGNOSIS — E1165 Type 2 diabetes mellitus with hyperglycemia: Secondary | ICD-10-CM

## 2019-11-14 MED ORDER — NOVOLOG FLEXPEN 100 UNIT/ML ~~LOC~~ SOPN: 22.0000 [IU] | PEN_INJECTOR | Freq: Three times a day (TID) | SUBCUTANEOUS | 1 refills | Status: DC

## 2019-11-21 ENCOUNTER — Other Ambulatory Visit: Payer: Self-pay | Admitting: Psychiatry

## 2019-11-22 ENCOUNTER — Encounter: Payer: Self-pay | Admitting: Internal Medicine

## 2019-11-22 DIAGNOSIS — E1165 Type 2 diabetes mellitus with hyperglycemia: Secondary | ICD-10-CM

## 2019-11-23 ENCOUNTER — Other Ambulatory Visit: Payer: Self-pay

## 2019-11-23 ENCOUNTER — Encounter: Payer: Self-pay | Admitting: Internal Medicine

## 2019-11-23 MED ORDER — PROPRANOLOL HCL 40 MG PO TABS: ORAL_TABLET | ORAL | 1 refills | Status: DC

## 2019-11-23 MED ORDER — NOVOLOG FLEXPEN 100 UNIT/ML ~~LOC~~ SOPN: 22.0000 [IU] | PEN_INJECTOR | Freq: Three times a day (TID) | SUBCUTANEOUS | 1 refills | Status: DC

## 2019-11-23 MED ORDER — LITHIUM CARBONATE 300 MG PO CAPS: ORAL_CAPSULE | ORAL | 1 refills | Status: DC

## 2019-11-25 ENCOUNTER — Other Ambulatory Visit: Payer: Self-pay | Admitting: Psychiatry

## 2019-11-25 ENCOUNTER — Telehealth: Payer: Self-pay | Admitting: Psychiatry

## 2019-11-25 ENCOUNTER — Telehealth: Payer: Self-pay

## 2019-11-25 NOTE — Telephone Encounter (Signed)
Pt called and said that his order for his Lithium [facility] was [facility]. He would like it sent to [facility] on [address]

## 2019-11-25 NOTE — Telephone Encounter (Signed)
PA for test strips filled out and faxed with confirmation.

## 2019-11-28 ENCOUNTER — Other Ambulatory Visit: Payer: Self-pay | Admitting: Psychiatry

## 2019-11-28 ENCOUNTER — Other Ambulatory Visit: Payer: Self-pay

## 2019-11-28 ENCOUNTER — Encounter: Payer: Self-pay | Admitting: Internal Medicine

## 2019-11-28 DIAGNOSIS — F250 Schizoaffective disorder, bipolar type: Secondary | ICD-10-CM

## 2019-11-28 DIAGNOSIS — Z79899 Other long term (current) drug therapy: Secondary | ICD-10-CM

## 2019-11-28 MED ORDER — ASENAPINE MALEATE 5 MG SL SUBL: 5.0000 mg | SUBLINGUAL_TABLET | Freq: Two times a day (BID) | SUBLINGUAL | 1 refills | Status: DC

## 2019-11-28 MED ORDER — GLUCOSE BLOOD VI STRP: ORAL_STRIP | 1 refills | Status: DC

## 2019-11-28 MED ORDER — ONETOUCH VERIO FLEX SYSTEM W/DEVICE KIT: PACK | 0 refills | Status: DC

## 2019-11-28 MED ORDER — ONETOUCH DELICA LANCETS 30G MISC: 1.0000 | Freq: Three times a day (TID) | 1 refills | Status: DC

## 2019-11-28 NOTE — Telephone Encounter (Signed)
Left pt a vm to return my call.

## 2019-11-28 NOTE — Telephone Encounter (Signed)
Pt. Made aware. He also stated that you were going to start him on Seroquel but pharmacy never received it? I did not see anything in the chart about this.

## 2019-11-28 NOTE — Telephone Encounter (Signed)
Pt states he is doing well on the Saphris. He did not mean to say Seroquel in our conversation. He ment to say he needs a refill on his 5 Mg Saphris sent to his pharmacy. Sorry for the miscommunication.

## 2019-11-28 NOTE — Telephone Encounter (Signed)
I last saw him in November and he is scheduled to see me in 3 weeks.  My last note indicated he was doing okay with the Saphris at the lower dose.  It does not indicate any reason to switch him to Seroquel.  We may have discussed it but let us not make the change at this time.  Would need an appointment and I need to be able to discuss with him in detail any reasons for the switch and get it documented.

## 2019-11-28 NOTE — Telephone Encounter (Signed)
Lab order sent to [facility] per patient request

## 2019-11-28 NOTE — Telephone Encounter (Signed)
Noted will submit Rx for Saphris

## 2019-11-28 NOTE — Telephone Encounter (Signed)
Please send to [facility].

## 2019-11-29 ENCOUNTER — Telehealth: Payer: Self-pay | Admitting: Internal Medicine

## 2019-11-29 ENCOUNTER — Telehealth: Payer: Self-pay

## 2019-11-29 MED ORDER — GLUCOSE BLOOD VI STRP: ORAL_STRIP | 1 refills | Status: DC

## 2019-11-29 MED ORDER — ONETOUCH DELICA LANCETS 30G MISC: 1.0000 | Freq: Four times a day (QID) | 11 refills | Status: DC

## 2019-11-29 NOTE — Telephone Encounter (Signed)
Both have been corrected and resent.

## 2019-11-29 NOTE — Telephone Encounter (Signed)
Hope from [facility] ([facility] - [place], [place] - [address] ph# [phone] requests to be called re: Verification of a RX for Lancets and test strips-(directions and quantity do not match). Reference# [id number]

## 2019-11-29 NOTE — Telephone Encounter (Signed)
PA for Freestyle test strips have been denied.

## 2019-12-01 ENCOUNTER — Telehealth: Payer: Self-pay

## 2019-12-01 NOTE — Telephone Encounter (Signed)
Prior authorization submitted for SAPHRIS 5mg  tablet sublingual through [employer] , approved effective 11/29/2019-11/28/2020  Submitted through cover my meds

## 2019-12-09 ENCOUNTER — Encounter: Payer: Self-pay | Admitting: Internal Medicine

## 2019-12-14 ENCOUNTER — Telehealth: Payer: Self-pay | Admitting: Psychiatry

## 2019-12-14 NOTE — Telephone Encounter (Signed)
Lab orders in computer already and will get faxed to [facility] as requested

## 2019-12-14 NOTE — Telephone Encounter (Signed)
Pt wants Lab order sent to [facility] on [address], in [place].

## 2019-12-15 ENCOUNTER — Encounter: Payer: Self-pay | Admitting: Psychiatry

## 2019-12-16 NOTE — Progress Notes (Signed)
Lithium level 0.6.  Normal BMP with relevant normal creatinine and calcium.

## 2019-12-21 ENCOUNTER — Ambulatory Visit (INDEPENDENT_AMBULATORY_CARE_PROVIDER_SITE_OTHER): Payer: 59 | Admitting: Psychiatry

## 2019-12-21 ENCOUNTER — Encounter: Payer: Self-pay | Admitting: Psychiatry

## 2019-12-21 DIAGNOSIS — F250 Schizoaffective disorder, bipolar type: Secondary | ICD-10-CM

## 2019-12-21 DIAGNOSIS — F4001 Agoraphobia with panic disorder: Secondary | ICD-10-CM

## 2019-12-21 DIAGNOSIS — G251 Drug-induced tremor: Secondary | ICD-10-CM | POA: Diagnosis not present

## 2019-12-21 DIAGNOSIS — F411 Generalized anxiety disorder: Secondary | ICD-10-CM

## 2019-12-21 DIAGNOSIS — Z79899 Other long term (current) drug therapy: Secondary | ICD-10-CM

## 2019-12-21 DIAGNOSIS — F5105 Insomnia due to other mental disorder: Secondary | ICD-10-CM

## 2019-12-21 DIAGNOSIS — F1211 Cannabis abuse, in remission: Secondary | ICD-10-CM

## 2019-12-21 NOTE — Progress Notes (Signed)
Cody Short [id number] 1976-05-13 44 y.o.   Virtual Visit via [place]  I connected with pt by WebEx and verified that I am speaking with the correct person using two identifiers.   I discussed the limitations, risks, security and privacy concerns of performing an evaluation and management service by Jack[facility]ul[facility] [place] and the availability of in person appointments. I also discussed with the patient that there may be a patient responsible charge related to this service. The patient expressed understanding and agreed to proceed.  I discussed the assessment and treatment plan with the patient. The patient was provided an opportunity to ask questions and all were answered. The patient agreed with the plan and demonstrated an understanding of the instructions.   The patient was advised to call back or seek an in-person evaluation if the symptoms worsen or if the condition fails to improve as anticipated.  I provided 30 minutes of video time during this encounter. The call started at 100 and ended at 1:30. The patient was located at home and the provider was located office.    Subjective:   Patient ID:  Cody Short is a 44 y.o. (DOB 1976/03/17) male.  Chief Complaint:  Chief Complaint  Patient presents with  . Anxiety  . Follow-up    bipolar  . Tremors  . Manic Behavior     Oddis P Bogdan presents to the office today for follow-up of several psychiatric diagnoses ...    seen August 2020.  For obsessive anxiety  Switched Lexapro 5 mg daily on May 18.  But he stated it made him hungry and so it was stopped in August.  He continued on Saphris 10 mg nightly, lithium 1200 mg daily, propranolol as needed tremor.  Lithium level was ordered.  He called Sept saying he was manic.  Had a lithium level 0.7 and so dose was increased to 1500 mg daily.    Last seen November 24 without med changes.  More manic and hyperverbal lately even on video games at night.  Less sleep than  usual to 6-7 hour for quite a long time.  Sleep during the day and up at nights.  Watching TV with dad and that may interfere with sleep.  Another friend says he complains a lot and talks about himself too much.  Less obsessed about the old GF 20 years ago after wrote someone on FB recently and thinks he's a lot better with it now.   Over his life he's had 5 girls that he's obsessed over. No other obsessions.   Overall done really well with moods since the increase in lithium from 1200 to 1500 mg daily.  The tremor is manageable with just propranolol.  No B6 needed now.   No longer excessively drowsy.  Occ conflict with people but manageable.  Less fear of driving and doing it more.  Patient reports stable mood and deniesr irritable moods.  Patient reports some recent difficulty with anxiety with GM living with them and she's difficult and he is also somewhat fearful of Covid for the sake of his family..  Patient denies difficulty with sleep initiation or maintenance; 6-7hours. Delays sleep phase chronically.  Denies appetite disturbance.  Patient reports that energy and motivation have been good.  Patient denies any difficulty with concentration.  Patient denies any suicidal ideation.  Admits he's easily stressed.  Works with [employer].  Prior psychiatric medications: risperidone with cognitive side effects, Abilify,  Zyprexa, Seroquel 600 mg, perphenazine, Saphris 20 , haloperidol,  Geodon, Saphris, Dietitian.   He's prone to EPS. Depakote (Note patient had severe pancreatitis from Depakote.), Equetro, N-acetylcysteine with minimal benefit, , Amantadine with vomiting.  Sertraline and lexapro sexual SE,   Propranolol for tremor, lithium 1500,   Review of Systems:  Review of Systems  Genitourinary: Positive for frequency.  Neurological: Positive for tremors. Negative for weakness.  Psychiatric/Behavioral: Positive for decreased concentration. Negative for agitation, behavioral problems,  confusion, dysphoric mood, hallucinations, self-injury, sleep disturbance and suicidal ideas. The patient is nervous/anxious. The patient is not hyperactive.     Medications: I have reviewed the patient's current medications.  Current Outpatient Medications  Medication Sig Dispense Refill  . asenapine (SAPHRIS) 5 MG SUBL 24 hr tablet Place 1 tablet (5 mg total) under the tongue 2 (two) times daily. (Patient taking differently: Place 10 mg under the tongue at bedtime. ) 180 tablet 1  . Blood Glucose Monitoring Suppl (ONETOUCH VERIO FLEX SYSTEM) w/Device KIT Use onetouch verio flex meter to check blood sugar 3 times daily. DX:E11.65 1 kit 0  . fenofibrate (TRICOR) 145 MG tablet Take 1 tablet (145 mg total) by mouth daily. 90 tablet 3  . glucose blood test strip Use OneTouch Verio strips as instructed to check blood sugar 4 times daily. DX:E11.65 400 each 1  . insulin aspart (NOVOLOG FLEXPEN) 100 UNIT/ML FlexPen Inject 22-25 Units into the skin 3 (three) times daily with meals. 60 mL 1  . Insulin Pen Needle (BD PEN NEEDLE NANO U/F) 32G X 4 MM MISC USE 3 TIMES DAILY WITH INSULIN PENS 300 each 3  . ipratropium (ATROVENT) 0.03 % nasal spray Place 2 sprays into both nostrils 3 (three) times daily with meals. 30 mL 11  . LANTUS SOLOSTAR 100 UNIT/ML Solostar Pen Inject 27 Units into the skin daily at 10 pm. 10 pen 11  . lithium carbonate 300 MG capsule TAKE 2 CAPSULE BY MOUTH IN THE MORNING AND TAKE 3 CAPSULES EVERY EVENING 360 capsule 1  . metFORMIN (GLUCOPHAGE-XR) 500 MG 24 hr tablet Take 4 tablets (2,000 mg total) by mouth daily with supper. 360 tablet 2  . nystatin-triamcinolone ointment (MYCOLOG) Apply to rectal area for internal hemorrhoids twice daily. 30 g 0  . OneTouch Delica Lancets 41D MISC 1 each by Does not apply route 4 (four) times daily. Use Onetouch Delica lancets to check blood sugar 4 times daily. DX:E11.65 400 each 11  . propranolol (INDERAL) 40 MG tablet TAKE 1 TABLET BY MOUTH 2 TIMES  DAILY AS NEEDED FOR TREMOR. (Patient taking differently: Take 80 mg by mouth every morning. TAKE 1 TABLET BY MOUTH 2 TIMES DAILY AS NEEDED FOR TREMOR.) 180 tablet 0  . triamcinolone (KENALOG) 0.025 % cream APPLY TO AFFECTED AREA TWICE A DAY  2   No current facility-administered medications for this visit.    Medication Side Effects: Other: less tremor  Allergies:  Allergies  Allergen Reactions  . Divalproex Sodium Other (See Comments)    unknown  . Methylphenidate Hcl     Aggravate bipolar  . Oxycodone Hcl     REACTION: hallucinations  . Paroxetine     Aggravate bipolar disorder    Past Medical History:  Diagnosis Date  . 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 Bipolar affective disorder ([place])   . DM w/o Complication Type II 03/28/2296  . HYPERTRIGLYCERIDEMIA 10/14/2010  . Nocturia 07/04/2010  . PILAR CYST 08/03/2007   Cyst in groin-states comes up when blood sugar gets high. Recurs if cannot walk for a week.     Marland Kitchen  TOBACCO ABUSE, HX OF 07/31/2009    Family History  Problem Relation Age of Onset  . Diabetes Mother   . Breast cancer Mother        was told not hereditary  . Cirrhosis Mother        fatty liver  . Diabetes Father   . Melanoma Maternal Uncle   . Colon cancer Neg Hx   . Stomach cancer Neg Hx   . Pancreatitis Neg Hx   . Heart disease Neg Hx   . Kidney disease Neg Hx   . Liver disease Neg Hx     Social History   Socioeconomic History  . Marital status: Single    Spouse name: Not on file  . Number of children: Not on file  . Years of education: Not on file  . Highest education level: Not on file  Occupational History  . Not on file  Tobacco Use  . Smoking status: Former Smoker    Packs/day: 2.50    Years: 10.00    Pack years: 25.00    Types: Cigarettes    Quit date: 10/27/2006    Years since quitting: 13.1  . Smokeless tobacco: Never Used  Substance and Sexual Activity  . Alcohol use: No    Alcohol/week: 0.0 standard drinks    Comment: none at all  .  Drug use: No  . Sexual activity: Not on file  Other Topics Concern  . Not on file  Social History Narrative   Single. Not dating currently. No kids.    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 Difficulty of Paying Living Expenses: Not on file  Food Insecurity:   . Worried About Charity fundraiser in the Last Year: Not on file  . Ran Out of Food in the Last Year: Not on file  Transportation Needs:   . Lack of Transportation (Medical): Not on file  . Lack of Transportation (Non-Medical): Not on file  Physical Activity:   . Days of Exercise per Week: Not on file  . Minutes of Exercise per Session: Not on file  Stress:   . Feeling of Stress : Not on file  Social Connections:   . Frequency of Communication with Friends and Family: Not on file  . Frequency of Social Gatherings with Friends and Family: Not on file  . Attends Religious Services: Not on file  . Active Member of Clubs or Organizations: Not on file  . Attends Archivist Meetings: Not on file  . Marital Status: Not on file  Intimate Partner Violence:   . Fear of Current or Ex-Partner: Not on file  . Emotionally Abused: Not on file  . Physically Abused: Not on file  . Sexually Abused: Not on file    Past Medical History, Surgical history, Social history, and Family history were reviewed and updated as appropriate.   Please see review of systems for further details on the patient's review from today.   Objective:   Physical Exam:  There were no vitals taken for this visit.  Physical Exam Neurological:     Mental Status: He is alert and oriented to person, place, and time.     Cranial Nerves: No dysarthria.  Psychiatric:        Attention  and Perception: Attention normal.        Mood and Affect: Mood is anxious. Mood is not depressed.         Speech: Speech normal.        Behavior: Behavior is not agitated or hyperactive. Behavior is cooperative.        Thought Content: Thought content is not paranoid or delusional. Thought content does not include homicidal or suicidal ideation. Thought content does not include homicidal or suicidal plan.        Cognition and Memory: Cognition and memory normal.     Comments: No paranoia.  Greatly reduced Obsessing over girl from 20 years ago.  Less anxiety Insight and judgment fair. Chronically self-preoccupied and hyperthymic style     Lab Review:     Component Value Date/Time   NA 140 06/18/2018 1502   K 4.5 06/18/2018 1502   CL 107 06/18/2018 1502   CO2 19 (L) 06/18/2018 1502   GLUCOSE 129 (H) 06/18/2018 1502   BUN 14 06/18/2018 1502   CREATININE 1.24 06/18/2018 1502   CALCIUM 10.3 06/18/2018 1502   PROT 8.3 (H) 06/18/2018 1502   ALBUMIN 4.4 02/10/2017 1631   AST 26 06/18/2018 1502   ALT 24 06/18/2018 1502   ALKPHOS 54 02/10/2017 1631   BILITOT 0.4 06/18/2018 1502   GFRNONAA 84 04/02/2018 1551   GFRAA 97 04/02/2018 1551       Component Value Date/Time   WBC 11.3 (H) 06/27/2016 1441   RBC 4.52 06/27/2016 1441   HGB 13.5 06/27/2016 1441   HCT 39.0 06/27/2016 1441   PLT 248.0 06/27/2016 1441   MCV 86.3 06/27/2016 1441   MCH 29.6 01/28/2014 1250   MCHC 34.5 06/27/2016 1441   RDW 13.2 06/27/2016 1441   LYMPHSABS 1.9 06/27/2016 1441   MONOABS 0.8 06/27/2016 1441   EOSABS 0.2 06/27/2016 1441   BASOSABS 0.1 06/27/2016 1441    Lithium Lvl  Date Value Ref Range Status  09/09/2019 1.1 0.6 - 1.2 mmol/L Final    12/14/19 lithium level 0.6 (trough 13 hours) , normal BMP and calcium   No results found for: PHENYTOIN, PHENOBARB, VALPROATE, CBMZ   .res Assessment: Plan:    Kunta was seen today for anxiety, follow-up, tremors and manic behavior.  Diagnoses and all orders for this visit:  Schizoaffective disorder, bipolar type ([place])  Generalized anxiety  disorder  Panic disorder with agoraphobia  Tremor due to multiple drugs  Insomnia due to mental condition  Lithium use  Marijuana abuse in remission   [name] has had significant disruptive mood and psychotic symptoms and substance abuse over the course of his treatment here.  It has been evident and chronic occupational dysfunction and some relational problems.  He is also been very prone to EPS making it difficult to get adequate mood stabilization.  He has not had adequate mood stabilization from lithium alone but his recent manic symptoms did improve with the increase in lithium from 1200 to 1500 mg daily.Marland Kitchen  Has a history of severe pancreatitis from Depakote.  After several months Is still doing well after having reduced Saphris from 15 mg to 10 mg due to excessive sleepiness.  He is still sleeping adequately at this time and is not reported a significant risk increase in manic symptoms.  There is still some risk of this and he is aware.  Increase Saphris back to 15 mg HS for ongoing mania.  He agrees.    consider fluoxetine or Viibryd for his history  of obsessional anxiety if absolutely necessary but would prefer to avoid this because of risk of triggering mania.  Overall he feels he is better with his anxiety than at his last appointment..    Discussed potential metabolic side effects associated with atypical antipsychotics, as well as potential risk for movement side effects. Advised pt to contact office if movement side effects occur.   Been EPS prone in the past and tremor prone.  Propranolol is managing the tremor.  He has a low stress tolerance.  He can be easily agitated in public. This is better at present.  He has continued abstinence from marijuana is encouraged.  He has a history of cannabinoid hyperemesis which finally convinced him to discontinue the marijuana.  His mood disorder and thought disorganization have improved since being off the marijuana.  He has a very long  history of heavy marijuana dependence.  12/14/19 lithium level 0.6 (trough 13 hours) , normal BMP and calcium on 1500 mg daily.  He needs a high normal blood level to maintain mood stability. But hesitant to increase lithium further bc DM complicates fluid balancce.  Counseled patient regarding potential benefits, risks, and side effects of lithium to include potential risk of lithium affecting thyroid and renal function.  Discussed need for periodic lab monitoring to determine drug level and to assess for potential adverse effects.  Counseled patient regarding signs and symptoms of lithium toxicity and advised that they notify office immediately or seek urgent medical attention if experiencing these signs and symptoms.  Patient advised to contact office with any questions or concerns.  Reviewed therapeutic letter writing as a way of finding resolution of sense of lack of closure over lost love of 20 years ago.  It's helped and he needs to do more.  Follow-up 6-8 weeks.  Pt has some chronic instability.  Lynder Parents, MD, D FA PA  Please see After Visit Summary for patient specific instructions.  Future Appointments  Date Time Provider [place]  02/14/2020  3:00 PM Philemon Kingdom, MD [facility] None    No orders of the defined types were placed in this encounter.     -------------------------------

## 2019-12-22 ENCOUNTER — Other Ambulatory Visit: Payer: Self-pay | Admitting: Internal Medicine

## 2019-12-22 MED ORDER — BD PEN NEEDLE NANO U/F 32G X 4 MM MISC: 3 refills | Status: DC

## 2019-12-27 ENCOUNTER — Telehealth: Payer: Self-pay | Admitting: Psychiatry

## 2019-12-27 NOTE — Telephone Encounter (Signed)
Patient called back and said that he did research and he thinks it is from the lithium due said he looked it up online and he said that it was a 70% chance it was that as a side effect. Please give him a call at [phone]

## 2019-12-27 NOTE — Telephone Encounter (Signed)
[name] called to report the at last visit he was also being treated for an UTI which was causing him urination issues and he was wetting the bed.  Now that the Seroquel has been increased in dose he is sleeping well, but too well because he sleeping right through the need to use the bathroom and it has increased his bed wetting.  So he needs a different medication or adjust the Seroquel again.  He has a call into his endocrinologist to address his other issue, but really doesn't need to be sleeping through needing to use the bathroom.  Please call to advise

## 2019-12-29 ENCOUNTER — Telehealth: Payer: Self-pay | Admitting: Psychiatry

## 2019-12-29 NOTE — Telephone Encounter (Signed)
It is not safe to abruptly stop or dramatically reduce lithium level.  We had to increase it not too long ago because of manic symptoms.  However we did increase Saphris to help with the manic symptoms.  If he continues 15 mg of Saphris then he can reduce the lithium from 5 daily to back to the 4 daily.  It may solve any urinary issues or at least the majority of them.  If we need to go further than that he will need to schedule an appointment

## 2019-12-29 NOTE — Telephone Encounter (Signed)
This is a duplicate message see prior telephone message

## 2019-12-29 NOTE — Telephone Encounter (Signed)
Pt called to report has issues with Lithium. Please call back for details @ [phone].

## 2019-12-30 ENCOUNTER — Telehealth: Payer: Self-pay | Admitting: Psychiatry

## 2019-12-30 NOTE — Telephone Encounter (Signed)
Instructed patient to increase Saphris 15 mg q hs, and decrease lithium to 4 daily instead of 5. Advised him that he needs to give the medication at least a week to see any benefit. He verbalized understanding.

## 2019-12-30 NOTE — Telephone Encounter (Signed)
He seen to different things that cannot both be true.  He is saying that the lithium is causing him to bed wet and then in turn says that is the Saphris causing it.  He should reduce the lithium as we discussed and increase the Saphris to 15 mg.  It will take a week for the blood level of the lithium to drop to the new baseline after he reduces the dose.  He would not see benefit in just 1 day.

## 2019-12-30 NOTE — Telephone Encounter (Signed)
I already did and I sent you another message back on what he's taking

## 2019-12-30 NOTE — Telephone Encounter (Signed)
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 This NDI messes with the kidneys and he will have to have treatments. He is going to be tested to make sure that is what he has. However, he will have to stop taking the lithium. He is consistently wetting the bed daily. Please give him a call at [phone]

## 2019-12-30 NOTE — Telephone Encounter (Signed)
Yes please give him the message that I had written.  He has to come off lithium slowly.  It is not safe to do so quickly.

## 2020-01-04 ENCOUNTER — Telehealth: Payer: Self-pay | Admitting: Psychiatry

## 2020-01-04 NOTE — Telephone Encounter (Signed)
Rtc to patient, he wanted to let me know how he was doing on his increase with Zyprexa 15 mg q hs and his decrease Lithium 4 at hs. York Spaniel he has noticed a slight difference with having to go to the bathroom so much. Informed him I would call him back Friday morning  and check his status. He agreed and was very Adult nurse.

## 2020-01-04 NOTE — Telephone Encounter (Signed)
Pt called to report to nurse as advised on Lithium reduction. Return call @ [phone]

## 2020-01-06 NOTE — Telephone Encounter (Signed)
He is taking Saphris 15 mg for clarity and not olanzapine.  He is also currently taking lithium 1200 mg a day after having reduced it recently.  Let him know he can reduce the lithium to 900 mg daily on Monday as long as he is not having any manic symptoms of significance.

## 2020-01-06 NOTE — Telephone Encounter (Signed)
Thank you for clarification instructed to continue Saphris 15 mg at hs, then on Monday 01/09/2020 reduce Lithium 300 mg to 3 at hs (900 mg) patient verbalized understanding.

## 2020-01-14 ENCOUNTER — Encounter: Payer: Self-pay | Admitting: Internal Medicine

## 2020-01-16 ENCOUNTER — Telehealth: Payer: Self-pay | Admitting: Psychiatry

## 2020-01-16 MED ORDER — GLUCOSE BLOOD VI STRP: ORAL_STRIP | 1 refills | Status: DC

## 2020-01-16 NOTE — Telephone Encounter (Signed)
Pt called stated he is now ready to decrease his Lithium. Please contact @ [phone]

## 2020-01-16 NOTE — Telephone Encounter (Signed)
Noted this was per instruction.

## 2020-01-19 ENCOUNTER — Telehealth: Payer: Self-pay | Admitting: Psychiatry

## 2020-01-19 NOTE — Telephone Encounter (Signed)
Pt called to report reduced Lithium to 2 on Monday. All is much better.

## 2020-01-23 ENCOUNTER — Telehealth: Payer: Self-pay | Admitting: Psychiatry

## 2020-01-23 NOTE — Telephone Encounter (Signed)
You are correct in that he has proceeded with instructions about real dosing lithium with an extension of that discussion on his own to completely gradually eliminate lithium.  He has an appointment in 2 weeks.  We have increased Saphris to compensate for the potential for mania.  At this point his lithium dose is probably too low to be of any significant benefit so we will allow him to proceed with his desire to completely eliminate lithium and let us know if his he has any relapse.  Given that this is his understanding I do not think you need to make any phone call to him unless there was a question from him.

## 2020-01-23 NOTE — Telephone Encounter (Signed)
Pt called to LM for the nurse. He stated he is taking 1 Lithium a day starting today with 1 week left. Call him if you have any questions.

## 2020-01-25 ENCOUNTER — Encounter: Payer: Self-pay | Admitting: Internal Medicine

## 2020-01-30 ENCOUNTER — Telehealth: Payer: Self-pay | Admitting: Psychiatry

## 2020-01-30 NOTE — Telephone Encounter (Signed)
Noted, Dr. Jennelle Human will discuss at his next visit on 02/07/2020

## 2020-01-30 NOTE — Telephone Encounter (Signed)
Pt called to inform the nurse that he took his last dose of Lithium. If any questions please give him a call.

## 2020-02-01 ENCOUNTER — Encounter: Payer: Self-pay | Admitting: Internal Medicine

## 2020-02-01 ENCOUNTER — Telehealth: Payer: Self-pay | Admitting: Psychiatry

## 2020-02-01 ENCOUNTER — Other Ambulatory Visit: Payer: Self-pay

## 2020-02-01 MED ORDER — ASENAPINE MALEATE 5 MG SL SUBL: 5.0000 mg | SUBLINGUAL_TABLET | Freq: Three times a day (TID) | SUBLINGUAL | 0 refills | Status: DC

## 2020-02-01 NOTE — Telephone Encounter (Signed)
[name] called to request refill of his Saphris, but needs to be the generic because the insurance won't cover the brand.  Please send to the [facility].  He is taking 5mg  TID

## 2020-02-01 NOTE — Telephone Encounter (Signed)
Updated Rx for Saphris 5 mg tid generic #[id number] sent to [facility]

## 2020-02-02 ENCOUNTER — Other Ambulatory Visit: Payer: Self-pay | Admitting: Internal Medicine

## 2020-02-02 DIAGNOSIS — E1165 Type 2 diabetes mellitus with hyperglycemia: Secondary | ICD-10-CM

## 2020-02-02 MED ORDER — LANTUS SOLOSTAR 100 UNIT/ML ~~LOC~~ SOPN: 32.0000 [IU] | PEN_INJECTOR | Freq: Every day | SUBCUTANEOUS | 3 refills | Status: DC

## 2020-02-05 ENCOUNTER — Encounter: Payer: Self-pay | Admitting: Internal Medicine

## 2020-02-06 ENCOUNTER — Telehealth: Payer: Self-pay | Admitting: Internal Medicine

## 2020-02-06 DIAGNOSIS — E1165 Type 2 diabetes mellitus with hyperglycemia: Secondary | ICD-10-CM

## 2020-02-06 MED ORDER — LANTUS SOLOSTAR 100 UNIT/ML ~~LOC~~ SOPN: 32.0000 [IU] | PEN_INJECTOR | Freq: Every day | SUBCUTANEOUS | 1 refills | Status: DC

## 2020-02-06 NOTE — Telephone Encounter (Signed)
Pharmacy called requesting a new RX be sent for the lantus solostar but asking that it be a 90 day supply.  Chartered loss adjuster [facility]) - [place], [place] - [address] [place] Phone:  [phone]  Fax:  [phone]

## 2020-02-06 NOTE — Telephone Encounter (Signed)
90 day refill sent. 

## 2020-02-07 ENCOUNTER — Ambulatory Visit (INDEPENDENT_AMBULATORY_CARE_PROVIDER_SITE_OTHER): Payer: 59 | Admitting: Psychiatry

## 2020-02-07 ENCOUNTER — Encounter: Payer: Self-pay | Admitting: Psychiatry

## 2020-02-07 DIAGNOSIS — F4001 Agoraphobia with panic disorder: Secondary | ICD-10-CM | POA: Diagnosis not present

## 2020-02-07 DIAGNOSIS — F250 Schizoaffective disorder, bipolar type: Secondary | ICD-10-CM | POA: Diagnosis not present

## 2020-02-07 DIAGNOSIS — F5105 Insomnia due to other mental disorder: Secondary | ICD-10-CM | POA: Diagnosis not present

## 2020-02-07 DIAGNOSIS — F1211 Cannabis abuse, in remission: Secondary | ICD-10-CM

## 2020-02-07 DIAGNOSIS — G251 Drug-induced tremor: Secondary | ICD-10-CM

## 2020-02-07 DIAGNOSIS — F411 Generalized anxiety disorder: Secondary | ICD-10-CM

## 2020-02-07 NOTE — Progress Notes (Signed)
Cody Short [id number] 1976/04/24 44 y.o.   Virtual Visit via [place]  I connected with pt by WebEx and verified that I am speaking with the correct person using two identifiers.   I discussed the limitations, risks, security and privacy concerns of performing an evaluation and management service by Jackquline [place] and the availability of in person appointments. I also discussed with the patient that there may be a patient responsible charge related to this service. The patient expressed understanding and agreed to proceed.  I discussed the assessment and treatment plan with the patient. The patient was provided an opportunity to ask questions and all were answered. The patient agreed with the plan and demonstrated an understanding of the instructions.   The patient was advised to call back or seek an in-person evaluation if the symptoms worsen or if the condition fails to improve as anticipated.  I provided 30 minutes of video time during this encounter. The call started at 100 and ended at 1:30. The patient was located at home and the provider was located office.    Subjective:   Patient ID:  RANFERI CLINGAN is a 44 y.o. (DOB [birth year]) male.  Chief Complaint:  Chief Complaint  Patient presents with  . Medication Problem  . Follow-up  . Manic Behavior     Trason P Vanbeek presents to the office today for follow-up of several psychiatric diagnoses ...    seen August 2020.  For obsessive anxiety  Switched Lexapro 5 mg daily on May 18.  But he stated it made him hungry and so it was stopped in August.  He continued on Saphris 10 mg nightly, lithium 1200 mg daily, propranolol as needed tremor.  Lithium level was ordered.  He called Sept saying he was manic.  Had a lithium level 0.7 and so dose was increased to 1500 mg daily.    Last seen December 21, 2019.  He had some ongoing manic symptoms and Saphris was increased to 15 mg nightly.  He has made multiple phone  calls to the office since that date.  He decided he wanted to stop lithium because of urinary problems.  He was warned this could lead to mania but he wanted to pursue it anyway.  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  [name] had been having bedwetting episodes which she attributed to the lithium.  He has not consistently taken Saphris 15 mg at bedtime since the office visit in February. He completely stopped lithium January 30, 2020  U frequency is much better and less thirsty.  Bedwetting resolved.  Reduced BM to 1-2 times daily.  Gassy.  Glucose is high.  B is hospitalized with unknown disease.  Insulin adjusted. Mental status is best it's ever been.  Less mania.  Less obsessing.  No worsening mania. Is taking Saphris 15 mg HS>  Patient reports stable mood and deniesr irritable moods.  Patient reports some recent difficulty with anxiety with GM living with them and she's difficult and he is also somewhat fearful of Covid for the sake of his family..  Patient denies difficulty with sleep initiation or maintenance; 6-7hours. Delays sleep phase chronically.  Denies appetite disturbance.  Patient reports that energy and motivation have been good.  Patient denies any difficulty with concentration.  Patient denies any suicidal ideation.  Admits he's easily stressed.  Works with [employer].  Prior psychiatric medications: risperidone with cognitive side effects, Abilify,  Zyprexa, Seroquel 600 mg,  perphenazine, Saphris 20 , haloperidol, Geodon, Saphris, Vraylar.   He's prone to EPS. Depakote (Note patient had severe pancreatitis from Depakote.), Equetro, N-acetylcysteine with minimal benefit, , Amantadine with vomiting.  Sertraline and lexapro sexual SE,   Propranolol for tremor, lithium 1500,   Review of Systems:  Review of Systems  Genitourinary: Negative for frequency.  Neurological: Negative for tremors and weakness.  Psychiatric/Behavioral:  Positive for decreased concentration. Negative for agitation, behavioral problems, confusion, dysphoric mood, hallucinations, self-injury, sleep disturbance and suicidal ideas. The patient is nervous/anxious. The patient is not hyperactive.     Medications: I have reviewed the patient's current medications.  Current Outpatient Medications  Medication Sig Dispense Refill  . asenapine (SAPHRIS) 5 MG SUBL 24 hr tablet Place 1 tablet (5 mg total) under the tongue 3 (three) times daily. 270 tablet 0  . Blood Glucose Monitoring Suppl (ONETOUCH VERIO FLEX SYSTEM) w/Device KIT Use onetouch verio flex meter to check blood sugar 3 times daily. DX:E11.65 1 kit 0  . fenofibrate (TRICOR) 145 MG tablet Take 1 tablet (145 mg total) by mouth daily. 90 tablet 3  . glucose blood test strip Use OneTouch Verio strips as instructed to check blood sugar 4 times daily. DX:E11.65 400 each 1  . insulin aspart (NOVOLOG FLEXPEN) 100 UNIT/ML FlexPen Inject 22-25 Units into the skin 3 (three) times daily with meals. 60 mL 1  . Insulin Pen Needle (BD PEN NEEDLE NANO U/F) 32G X 4 MM MISC USE 4 TIMES DAILY WITH INSULIN PENS 400 each 3  . ipratropium (ATROVENT) 0.03 % nasal spray Place 2 sprays into both nostrils 3 (three) times daily with meals. 30 mL 11  . LANTUS SOLOSTAR 100 UNIT/ML Solostar Pen Inject 32 Units into the skin at bedtime. 28.8 mL 1  . metFORMIN (GLUCOPHAGE-XR) 500 MG 24 hr tablet Take 4 tablets (2,000 mg total) by mouth daily with supper. 360 tablet 2  . nystatin-triamcinolone ointment (MYCOLOG) Apply to rectal area for internal hemorrhoids twice daily. 30 g 0  . OneTouch Delica Lancets 10G MISC 1 each by Does not apply route 4 (four) times daily. Use Onetouch Delica lancets to check blood sugar 4 times daily. DX:E11.65 400 each 29  . triamcinolone (KENALOG) 0.025 % cream APPLY TO AFFECTED AREA TWICE A DAY  2   No current facility-administered medications for this visit.    Medication Side Effects: Other:  less tremor  Allergies:  Allergies  Allergen Reactions  . Divalproex Sodium Other (See Comments)    unknown  . Methylphenidate Hcl     Aggravate bipolar  . Oxycodone Hcl     REACTION: hallucinations  . Paroxetine     Aggravate bipolar disorder    Past Medical History:  Diagnosis Date  . 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 Bipolar affective disorder ([place])   . DM w/o Complication Type II 12/03/9483  . HYPERTRIGLYCERIDEMIA 10/14/2010  . Nocturia 07/04/2010  . PILAR CYST 08/03/2007   Cyst in groin-states comes up when blood sugar gets high. Recurs if cannot walk for a week.     . TOBACCO ABUSE, HX OF 07/31/2009    Family History  Problem Relation Age of Onset  . Diabetes Mother   . Breast cancer Mother        was told not hereditary  . Cirrhosis Mother        fatty liver  . Diabetes Father   . Melanoma Maternal Uncle   . Colon cancer Neg Hx   . Stomach cancer Neg Hx   .  Pancreatitis Neg Hx   . Heart disease Neg Hx   . Kidney disease Neg Hx   . Liver disease Neg Hx     Social History   Socioeconomic History  . Marital status: Single    Spouse name: Not on file  . Number of children: Not on file  . Years of education: Not on file  . Highest education level: Not on file  Occupational History  . Not on file  Tobacco Use  . Smoking status: Former Smoker    Packs/day: 2.50    Years: 10.00    Pack years: 25.00    Types: Cigarettes    Quit date: 10/27/2006    Years since quitting: 13.2  . Smokeless tobacco: Never Used  Substance and Sexual Activity  . Alcohol use: No    Alcohol/week: 0.0 standard drinks    Comment: none at all  . Drug use: No  . Sexual activity: Not on file  Other Topics Concern  . Not on file  Social History Narrative   Single. Not dating currently. No kids.    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 Difficulty of Paying Living Expenses:   Food Insecurity:   . Worried About Charity fundraiser in the Last Year:   . Arboriculturist in the Last Year:   Transportation Needs:   . Film/video editor (Medical):   Marland Kitchen Lack of Transportation (Non-Medical):   Physical Activity:   . Days of Exercise per Week:   . Minutes of Exercise per Session:   Stress:   . Feeling of Stress :   Social Connections:   . Frequency of Communication with Friends and Family:   . Frequency of Social Gatherings with Friends and Family:   . Attends Religious Services:   . Active Member of Clubs or Organizations:   . Attends Archivist Meetings:   Marland Kitchen Marital Status:   Intimate Partner Violence:   . Fear of Current or Ex-Partner:   . Emotionally Abused:   Marland Kitchen Physically Abused:   . Sexually Abused:     Past Medical History, Surgical history, Social history, and Family history were reviewed and updated as appropriate.   Please see review of systems for further details on the patient's review from today.   Objective:   Physical Exam:  There were no vitals taken for this visit.  Physical Exam Neurological:     Mental Status: He is alert and oriented to person, place, and time.     Cranial Nerves: No dysarthria.  Psychiatric:        Attention and Perception: Attention normal.        Mood and Affect: Mood is anxious. Mood is not depressed.        Speech: Speech normal. Speech is not rapid and pressured.        Behavior: Behavior is not agitated or hyperactive. Behavior is cooperative.        Thought Content: Thought content is not paranoid or delusional. Thought content does not include homicidal or suicidal ideation. Thought content does not include homicidal or suicidal plan.        Cognition and Memory: Cognition and memory normal.     Comments: No paranoia.  Greatly reduced Obsessing  over girl from 20 years ago.  Less anxiety Insight and judgment fair. Chronically  self-preoccupied and hyperthymic style     Lab Review:     Component Value Date/Time   NA 140 06/18/2018 1502   K 4.5 06/18/2018 1502   CL 107 06/18/2018 1502   CO2 19 (L) 06/18/2018 1502   GLUCOSE 129 (H) 06/18/2018 1502   BUN 14 06/18/2018 1502   CREATININE 1.24 06/18/2018 1502   CALCIUM 10.3 06/18/2018 1502   PROT 8.3 (H) 06/18/2018 1502   ALBUMIN 4.4 02/10/2017 1631   AST 26 06/18/2018 1502   ALT 24 06/18/2018 1502   ALKPHOS 54 02/10/2017 1631   BILITOT 0.4 06/18/2018 1502   GFRNONAA 84 04/02/2018 1551   GFRAA 97 04/02/2018 1551       Component Value Date/Time   WBC 11.3 (H) 06/27/2016 1441   RBC 4.52 06/27/2016 1441   HGB 13.5 06/27/2016 1441   HCT 39.0 06/27/2016 1441   PLT 248.0 06/27/2016 1441   MCV 86.3 06/27/2016 1441   MCH 29.6 01/28/2014 1250   MCHC 34.5 06/27/2016 1441   RDW 13.2 06/27/2016 1441   LYMPHSABS 1.9 06/27/2016 1441   MONOABS 0.8 06/27/2016 1441   EOSABS 0.2 06/27/2016 1441   BASOSABS 0.1 06/27/2016 1441    Lithium Lvl  Date Value Ref Range Status  09/09/2019 1.1 0.6 - 1.2 mmol/L Final    12/14/19 lithium level 0.6 (trough 13 hours) , normal BMP and calcium   No results found for: PHENYTOIN, PHENOBARB, VALPROATE, CBMZ   .res Assessment: Plan:    Travonne was seen today for medication problem, follow-up and manic behavior.  Diagnoses and all orders for this visit:  Schizoaffective disorder, bipolar type ([place])  Generalized anxiety disorder  Panic disorder with agoraphobia  Insomnia due to mental condition  Marijuana abuse in remission  Lithium-induced tremor   [name] has had significant disruptive mood and psychotic symptoms and substance abuse over the course of his treatment here.  It has been evident and chronic occupational dysfunction and some relational problems.  He is also been very prone to EPS making it difficult to get adequate mood stabilization.   Has a history of severe pancreatitis from Depakote.  DC lithium bc  of diabetes insipidus.  Disc risk mania occurring is still a concern because he is only been off the lithium for short time frame.  Discussed potential metabolic side effects associated with atypical antipsychotics, as well as potential risk for movement side effects. Advised pt to contact office if movement side effects occur.   Been EPS prone in the past and tremor prone.  Propranolol is managing the tremor.  He has a low stress tolerance.  He can be easily agitated in public. This is better at present.  Wean off propranolol now that he is off lithium.  He is not currently having a tremor.  Let us know if the tremor returns.  He has continued abstinence from marijuana is encouraged.  He has a history of cannabinoid hyperemesis which finally convinced him to discontinue the marijuana.  His mood disorder and thought disorganization have improved since being off the marijuana.  He has a very long history of heavy marijuana dependence.  Continue Saphris 15 mg nightly for manic control.  Follow-up 8 weeks.  Pt has some chronic instability.  Lynder Parents, MD, D FA PA  Please see After Visit Summary for patient specific instructions.  Future Appointments  Date Time Provider [place]  02/14/2020  3:00  PM Philemon Kingdom, MD [id number] None    No orders of the defined types were placed in this encounter.     -------------------------------

## 2020-02-09 ENCOUNTER — Encounter: Payer: Self-pay | Admitting: Internal Medicine

## 2020-02-10 ENCOUNTER — Other Ambulatory Visit: Payer: Self-pay

## 2020-02-11 ENCOUNTER — Encounter: Payer: Self-pay | Admitting: Internal Medicine

## 2020-02-14 ENCOUNTER — Encounter: Payer: Self-pay | Admitting: Internal Medicine

## 2020-02-14 ENCOUNTER — Other Ambulatory Visit: Payer: Self-pay

## 2020-02-14 ENCOUNTER — Ambulatory Visit: Payer: 59 | Admitting: Internal Medicine

## 2020-02-14 VITALS — BP 130/90 | HR 97 | Ht 69.25 in | Wt 220.0 lb

## 2020-02-14 DIAGNOSIS — E669 Obesity, unspecified: Secondary | ICD-10-CM | POA: Diagnosis not present

## 2020-02-14 DIAGNOSIS — E1165 Type 2 diabetes mellitus with hyperglycemia: Secondary | ICD-10-CM

## 2020-02-14 DIAGNOSIS — E781 Pure hyperglyceridemia: Secondary | ICD-10-CM

## 2020-02-14 LAB — POCT GLYCOSYLATED HEMOGLOBIN (HGB A1C): Hemoglobin A1C: 7.9 % — AB (ref 4.0–5.6)

## 2020-02-14 MED ORDER — ASENAPINE MALEATE 5 MG SL SUBL: 5.0000 mg | SUBLINGUAL_TABLET | Freq: Three times a day (TID) | SUBLINGUAL | 0 refills | Status: DC

## 2020-02-14 MED ORDER — OZEMPIC (0.25 OR 0.5 MG/DOSE) 2 MG/1.5ML ~~LOC~~ SOPN: 0.2500 mg | PEN_INJECTOR | SUBCUTANEOUS | 5 refills | Status: DC

## 2020-02-14 NOTE — Patient Instructions (Signed)
Please continue: - Metformin ER 2000 mg with dinner - Novolog before meals: 20-25 units 2-3x a day - Lantus 36 units at bedtime  Please start: - Ozempic 0.25 mg weekly  Please return in 3 to 4 months with your sugar log

## 2020-02-14 NOTE — Progress Notes (Addendum)
Patient ID: Cody Short, male   DOB: 11/10/1975, 44 y.o.   MRN: [id number]  This visit occurred during the SARS-CoV-2 public health emergency.  Safety protocols were in place, including screening questions prior to the visit, additional usage of staff PPE, and extensive cleaning of exam room while observing appropriate contact time as indicated for disinfecting solutions.   HPI: Cody Short is a 44 y.o.-year-old male, presenting for follow-up for DM 2, dx in ~2010, insulin-dependent, uncontrolled, without long term complications. Last visit 4 months ago-virtual.  He stopped Li ~1 mo ago. His frequent urination resolved.  While transitioning off lithium, his Saphris dose was increased.  His sugars started to increase afterwards.  He is wondering if these were related.  Reviewed HbA1c levels: Lab Results  Component Value Date   HGBA1C 7.7 (A) 06/14/2019   HGBA1C 6.8 (A) 11/29/2018   HGBA1C 8.1 (A) 04/02/2018  12/22/2018: HbA1c 7.2%  Pt is on a regimen of: - Metformin ER 2000 mg at bedtime - Novolog before meals: 20-25 units 2-3x a day >> 20-25 units - Lantus 10-15 (25) >> 36 units at bedtime We had to stop Jardiance due to increased urination. He was previously on Lantus but stopped since sugars improved. He tried Glipizide >> hypoglycemia in the 40s repeatedly. Lowest: 25. Also, Glipizide 2.5 mg in am >> nausea, vomiting, constipation We tried Invokana >> bothersome urination (when he was working) >> had to stop. He had pancreatitis 2/2 Depakote and HTG in the past. He stopped Actos b/c stomach pain.  We stopped Cycloset >> inefficient, could not use b/c price.  Pt checks his sugars 4x a day: - 5-6 pm:  106-148, 160-202 w/o Lantus >> n/c >> 98-247 - 2h after b'fast: 123-170 >> 96-200 >> 76-224  >> n/c >> 247 - lunch: n/c >> 102-157, 190 >> 130-160, 223 >> n/c - 2h after lunch:86-231 >> 72-220 (ave: 120-150) >> 176-314, 399 - before dinner:  190-220 >> 92-218, 306  >> n/c >> 230, 387 - 2h after dinner:  95-173 >> 108-242 (ave 150-160) >> 160-326 - bedtime:  190-250, 300 >> 84, 122-242, 273 >> 115-182 >> 131-220 - nighttime:1 111-270, 309 >> 89-181 >> see above Lowest sugar was  96 >> 76 >> 72 >> 50s; he has hypoglycemia awareness in the 70s. Highest sugar was  300 >> 300s >> 242 >> 399.  Glucometer: FreeStyle  -No history of CKD, last BUN/creatinine:  12/14/2019 8/1.09, GFR 84, glucose 102 12/22/2018: 15/1.13, GFR 81, glucose 110 Lab Results  Component Value Date   BUN 14 06/18/2018   CREATININE 1.24 06/18/2018   He had elevated ACR: 12/22/2018: 262 Lab Results  Component Value Date   MICRALBCREAT 26.7 04/02/2018   MICRALBCREAT 3.7 02/10/2017   MICRALBCREAT 27.3 12/17/2015   MICRALBCREAT 4.0 02/22/2015   MICRALBCREAT 1.0 01/03/2014   -+ Mixed hyperlipidemia;  last set of lipids: 12/22/2018: 220/359/41/145 Lab Results  Component Value Date   CHOL 197 04/02/2018   HDL 32.70 (L) 04/02/2018   LDLCALC 67 02/02/2014   LDLDIRECT 120.0 04/02/2018   TRIG 341.0 (H) 04/02/2018   CHOLHDL 6 04/02/2018  On fenofibrate.  - last eye exam was on 03/2017: No DR  -+ Numbness But no tingling in his feet  In 2010, he developed pancreatitis from Depakote.  + rash  - scrotal - improved (almost) on Nystatin + Triamcinolone.  He may need refills in the future. He has recurrent groin furunculosis.  He is on Metamucil for constipation 2/2  Saphris.  TSH was normal, at 3.648 on 12/22/2018.  In the past, he was exercising 1h-1.5h every night: 2.5-3.0 mph, also lifting weights.   ROS: Constitutional: no weight gain/no weight loss, no fatigue, no subjective hyperthermia, no subjective hypothermia Eyes: no blurry vision, no xerophthalmia ENT: no sore throat, no nodules palpated in neck, no dysphagia, no odynophagia, no hoarseness Cardiovascular: no CP/no SOB/no palpitations/no leg swelling Respiratory: no cough/no SOB/no wheezing Gastrointestinal: no N/no  V/no D/no C/no acid reflux Musculoskeletal: no muscle aches/no joint aches Skin: no rashes, no hair loss Neurological: no tremors/+ numbness/no tingling/no dizziness  I reviewed pt's medications, allergies, PMH, social hx, family hx, and changes were documented in the history of present illness. Otherwise, unchanged from my initial visit note.  Past Medical History:  Diagnosis Date  . 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 Bipolar affective disorder ([place])   . DM w/o Complication Type II 11/04/1476  . HYPERTRIGLYCERIDEMIA 10/14/2010  . Nocturia 07/04/2010  . PILAR CYST 08/03/2007   Cyst in groin-states comes up when blood sugar gets high. Recurs if cannot walk for a week.     . TOBACCO ABUSE, HX OF 07/31/2009   Past Surgical History:  Procedure Laterality Date  . pilar cystectomy     History   Social History  . Marital Status: Single    Spouse Name: N/A  . Number of Children: 0   Occupational History  .    Social History Main Topics  . Smoking status: Former Smoker - quit in 2010  . Smokeless tobacco: Never Used  . Alcohol Use: No     Comment: none at all  . Drug Use: No   Current Outpatient Medications  Medication Sig Dispense Refill  . asenapine (SAPHRIS) 5 MG SUBL 24 hr tablet Place 1 tablet (5 mg total) under the tongue 3 (three) times daily. 270 tablet 0  . Blood Glucose Monitoring Suppl (ONETOUCH VERIO FLEX SYSTEM) w/Device KIT Use onetouch verio flex meter to check blood sugar 3 times daily. DX:E11.65 1 kit 0  . fenofibrate (TRICOR) 145 MG tablet Take 1 tablet (145 mg total) by mouth daily. 90 tablet 3  . glucose blood test strip Use OneTouch Verio strips as instructed to check blood sugar 4 times daily. DX:E11.65 400 each 1  . insulin aspart (NOVOLOG FLEXPEN) 100 UNIT/ML FlexPen Inject 22-25 Units into the skin 3 (three) times daily with meals. 60 mL 1  . Insulin Pen Needle (BD PEN NEEDLE NANO U/F) 32G X 4 MM MISC USE 4 TIMES DAILY WITH INSULIN PENS 400 each 3  . ipratropium  (ATROVENT) 0.03 % nasal spray Place 2 sprays into both nostrils 3 (three) times daily with meals. 30 mL 11  . LANTUS SOLOSTAR 100 UNIT/ML Solostar Pen Inject 32 Units into the skin at bedtime. 28.8 mL 1  . metFORMIN (GLUCOPHAGE-XR) 500 MG 24 hr tablet Take 4 tablets (2,000 mg total) by mouth daily with supper. 360 tablet 2  . nystatin-triamcinolone ointment (MYCOLOG) Apply to rectal area for internal hemorrhoids twice daily. 30 g 0  . OneTouch Delica Lancets 29F MISC 1 each by Does not apply route 4 (four) times daily. Use Onetouch Delica lancets to check blood sugar 4 times daily. DX:E11.65 400 each 92  . triamcinolone (KENALOG) 0.025 % cream APPLY TO AFFECTED AREA TWICE A DAY  2   No current facility-administered medications for this visit.    No current facility-administered medications on file prior to visit.    Allergies  Allergen Reactions  .  Divalproex Sodium Other (See Comments)    unknown  . Methylphenidate Hcl     Aggravate bipolar  . Oxycodone Hcl     REACTION: hallucinations  . Paroxetine     Aggravate bipolar disorder   Family History  Problem Relation Age of Onset  . Diabetes Mother   . Breast cancer Mother        was told not hereditary  . Cirrhosis Mother        fatty liver  . Diabetes Father   . Melanoma Maternal Uncle   . Colon cancer Neg Hx   . Stomach cancer Neg Hx   . Pancreatitis Neg Hx   . Heart disease Neg Hx   . Kidney disease Neg Hx   . Liver disease Neg Hx    PE: BP 130/90   Pulse 97   Ht 5' 9.25" (1.759 m)   Wt 220 lb (99.8 kg)   SpO2 97%   BMI 32.25 kg/m  Body mass index is 32.25 kg/m. Wt Readings from Last 3 Encounters:  02/14/20 220 lb (99.8 kg)  06/14/19 219 lb (99.3 kg)  11/29/18 209 lb (94.8 kg)   Constitutional: overweight, in NAD Eyes: PERRLA, EOMI, no exophthalmos ENT: moist mucous membranes, no thyromegaly, no cervical lymphadenopathy Cardiovascular: tachycardia, RR, No MRG Respiratory: CTA B Gastrointestinal: abdomen  soft, NT, ND, BS+ Musculoskeletal: no deformities, strength intact in all 4 Skin: moist, warm, no rashes Neurological: no tremor with outstretched hands, DTR normal in all 4  ASSESSMENT: 1. DM2, insulin-dependent, uncontrolled, without long term complications, but with hyperglycemia  His test were negative for type 1 diabetes: Component     Latest Ref Rng 05/28/2015  C-Peptide     0.80 - 3.90 ng/mL 2.88  Glucose, Fasting     65 - 99 mg/dL 120 (H)  Glutamic Acid Decarb Ab     <5 IU/mL <5  Pancreatic Islet Cell Antibody     < 5 JDF Units <5   - His diabetes is difficult to manage because of multiple intolerances: - We tried to add Invokana but he did not tolerated due to increased urination.  - We cannot use a DPP 4 inhibitor or a GLP-1 receptor agonist due to his history of pancreatitis.  - He had to stop Actos >>  abd. Pain - we stopped Cycloset >> expensive and not effective - we tried Glipizide >> GI sxs: N/V/D - he tried the Dexcom G6 CGM but he felt that this was giving him a lot of errors and stopped  2. HTG  3.  Obesity class I  PLAN:  1. Patient with longstanding, uncontrolled, type 2 diabetes, with improved control after starting Jardiance but we had to stop due to very frequent urination, improved diet and exercise.  However, he does have intermittent compliance with diet and exercise and as of now, his sugars are quite high since he is not exercising.  We continued to stay in touch since last visit with adjustments of his insulin doses.  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 -At this visit, sugars are higher than they started after Saphris was increased.  Per review of the literature, Saphris can increase blood sugars.  He had to increase the doses of insulin and for now we will continue the higher dose of Lantus.  He started the sugars  started to improve  in the last 2 days and I am hoping that we will continue to improve.  However, for now, we had a long discussion about possibly adding Ozempic to his regimen (see above).  We decided to start a low dose.  He was thoroughly instructed about possible signs and symptoms of pancreatitis.  He is aware that he needs to get in touch with me immediately if he develops them.  He agrees to try this and I gave him a coupon card and sent the prescription to his pharmacy.  For now we plan to only continue at 0.25 mg weekly. - I advised him to: Patient Instructions  Please continue: - Metformin ER 2000 mg with dinner - Novolog before meals: 20-25 units 2-3x a day - Lantus 36 units at bedtime  Please start: - Ozempic 0.25 mg weekly  Please return in 3 to 4 months with your sugar log  - we checked his HbA1c: 7.9% (higher) - advised to check sugars at different times of the day - 3x a day, rotating check times - advised for yearly eye exams >> he is not UTD - return to clinic in 3-4 months  2. HL -He has a history of hypertriglyceridemia -We started fenofibrate and also suggested to go back to the plant-based diet.  He is aware about the importance of diet and exercise.  He usually fluctuates between periods in which he exercises almost every day with good results of sugar control, and periods  In which she is not exercising.  He is now not exercising, but plans to start -Reviewed latest lipid data from 11/2018: LDL above target, triglycerides still high; however, he had more recent labs with PCP.  We will ask for records. -He continues fenofibrate without side effects  3.  Obesity class I -Continues Metformin which is also appetite reducing and weight stabilizing long-term -He is not exercising now but plans to start-strongly encouraged him to do so -We will also start Ozempic which should also help with weight loss  Addendum: I received patient's latest labs from 11/23/2019: Glucose  130, BUN/creatinine 8/1.16, GFR 78.  Calcium slightly high at 10.4 (8.7-10.2), Albumin 5.2 (4-5), AST 48 (0-40, ALT 41 (0-44) Lipids: 109/148/34/49  We will need to check his ACR at next visit.  Philemon Kingdom, MD PhD [facility]

## 2020-02-15 ENCOUNTER — Encounter: Payer: Self-pay | Admitting: Internal Medicine

## 2020-02-21 ENCOUNTER — Encounter: Payer: Self-pay | Admitting: Internal Medicine

## 2020-02-21 ENCOUNTER — Telehealth: Payer: Self-pay | Admitting: Psychiatry

## 2020-02-21 NOTE — Telephone Encounter (Signed)
Evidently he wrongly blamed lithium for the frequent urination.  I cannot do anything about this problem he will have to manage his blood sugar with his endocrinologist.  Switching from Saphris is not going to solve this problem.

## 2020-02-21 NOTE — Telephone Encounter (Signed)
Pt said that the saphris that he is taking is causing his blood sugar to be real high. Pt still has frequent urination. Pt would like to know what he can do to control these symptoms

## 2020-02-22 NOTE — Telephone Encounter (Signed)
Elevation in blood sugar is a class warning on all antipsychotics.  There is no evidence that Saphris is any worse than any others.  I will discuss it with him at his follow-up appointment.

## 2020-03-01 ENCOUNTER — Encounter: Payer: Self-pay | Admitting: Internal Medicine

## 2020-03-03 ENCOUNTER — Encounter: Payer: Self-pay | Admitting: Internal Medicine

## 2020-03-05 ENCOUNTER — Telehealth: Payer: Self-pay | Admitting: Psychiatry

## 2020-03-05 NOTE — Telephone Encounter (Signed)
It is unwise to change his meds at this time over the phone.  If he wants an earlier appt put him on cancellation list.

## 2020-03-05 NOTE — Telephone Encounter (Signed)
Pt stated since he has been on SAPHRIS his blood sugar was high, but since then it has been going down some. Pt stated he is sleeping more than 8 hours. Requesting a call back [phone].

## 2020-03-06 NOTE — Telephone Encounter (Signed)
Left message with information and if he wants added to cancellation list to call back.

## 2020-03-09 ENCOUNTER — Encounter: Payer: Self-pay | Admitting: Internal Medicine

## 2020-03-14 ENCOUNTER — Encounter: Payer: Self-pay | Admitting: Internal Medicine

## 2020-03-14 DIAGNOSIS — E1165 Type 2 diabetes mellitus with hyperglycemia: Secondary | ICD-10-CM

## 2020-03-16 MED ORDER — NOVOLOG FLEXPEN 100 UNIT/ML ~~LOC~~ SOPN: 20.0000 [IU] | PEN_INJECTOR | Freq: Three times a day (TID) | SUBCUTANEOUS | 2 refills | Status: DC

## 2020-03-16 MED ORDER — LANTUS SOLOSTAR 100 UNIT/ML ~~LOC~~ SOPN: 36.0000 [IU] | PEN_INJECTOR | Freq: Every day | SUBCUTANEOUS | 3 refills | Status: DC

## 2020-03-16 MED ORDER — METFORMIN HCL ER 500 MG PO TB24: 2000.0000 mg | ORAL_TABLET | Freq: Every day | ORAL | 2 refills | Status: DC

## 2020-03-16 MED ORDER — OZEMPIC (0.25 OR 0.5 MG/DOSE) 2 MG/1.5ML ~~LOC~~ SOPN: 0.2500 mg | PEN_INJECTOR | SUBCUTANEOUS | 5 refills | Status: DC

## 2020-04-05 ENCOUNTER — Other Ambulatory Visit: Payer: Self-pay

## 2020-04-05 DIAGNOSIS — E1165 Type 2 diabetes mellitus with hyperglycemia: Secondary | ICD-10-CM

## 2020-04-05 MED ORDER — METFORMIN HCL ER 500 MG PO TB24: 2000.0000 mg | ORAL_TABLET | Freq: Every day | ORAL | 2 refills | Status: DC

## 2020-04-08 ENCOUNTER — Encounter: Payer: Self-pay | Admitting: Internal Medicine

## 2020-04-08 DIAGNOSIS — E1165 Type 2 diabetes mellitus with hyperglycemia: Secondary | ICD-10-CM

## 2020-04-09 ENCOUNTER — Other Ambulatory Visit: Payer: Self-pay

## 2020-04-09 DIAGNOSIS — E1165 Type 2 diabetes mellitus with hyperglycemia: Secondary | ICD-10-CM

## 2020-04-09 MED ORDER — NOVOLOG FLEXPEN 100 UNIT/ML ~~LOC~~ SOPN: 30.0000 [IU] | PEN_INJECTOR | Freq: Three times a day (TID) | SUBCUTANEOUS | 2 refills | Status: DC

## 2020-04-10 ENCOUNTER — Encounter: Payer: Self-pay | Admitting: Internal Medicine

## 2020-04-10 ENCOUNTER — Encounter: Payer: Self-pay | Admitting: Psychiatry

## 2020-04-10 ENCOUNTER — Telehealth (INDEPENDENT_AMBULATORY_CARE_PROVIDER_SITE_OTHER): Payer: 59 | Admitting: Psychiatry

## 2020-04-10 DIAGNOSIS — F4001 Agoraphobia with panic disorder: Secondary | ICD-10-CM | POA: Diagnosis not present

## 2020-04-10 DIAGNOSIS — F5105 Insomnia due to other mental disorder: Secondary | ICD-10-CM

## 2020-04-10 DIAGNOSIS — F411 Generalized anxiety disorder: Secondary | ICD-10-CM

## 2020-04-10 DIAGNOSIS — F250 Schizoaffective disorder, bipolar type: Secondary | ICD-10-CM | POA: Diagnosis not present

## 2020-04-10 MED ORDER — LOXAPINE SUCCINATE 50 MG PO CAPS: ORAL_CAPSULE | ORAL | 1 refills | Status: DC

## 2020-04-10 NOTE — Progress Notes (Signed)
Cody Short [id number] 04-Sep-1976 43 y.o.   Virtual Visit via [place]  I connected with pt by WebEx and verified that I am speaking with the correct person using two identifiers.   I discussed the limitations, risks, security and privacy concerns of performing an evaluation and management service by Jack[facility]ul[facility] and the availability of in person appointments. I also discussed with the patient that there may be a patient responsible charge related to this service. The patient expressed understanding and agreed to proceed.  I discussed the assessment and treatment plan with the patient. The patient was provided an opportunity to ask questions and all were answered. The patient agreed with the plan and demonstrated an understanding of the instructions.   The patient was advised to call back or seek an in-person evaluation if the symptoms worsen or if the condition fails to improve as anticipated.  30 minutes video encounter from 3:30 PM to 4:00 PM.   Subjective:   Patient ID:  Cody Short is a 44 y.o. (DOB 11-Mar-1976) male.  Chief Complaint:  Chief Complaint  Patient presents with  . Medication Problem  . Manic Behavior  . Follow-up     Cody Short presents to the office today for follow-up of several psychiatric diagnoses ...    seen August 2020.  For obsessive anxiety  Switched Lexapro 5 mg daily on May 18.  But he stated it made him hungry and so it was stopped in August.  He continued on Saphris 10 mg nightly, lithium 1200 mg daily, propranolol as needed tremor.  Lithium level was ordered.  He called Sept saying he was manic.  Had a lithium level 0.7 and so dose was increased to 1500 mg daily.    Last seen December 21, 2019.  He had some ongoing manic symptoms and Saphris was increased to 15 mg nightly.  02/07/2020 appointment with the following noted: He has made multiple phone calls to the office since that date.  He decided he wanted to stop lithium  because of urinary problems.  He was warned this could lead to mania but he wanted to pursue it anyway.  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  Cody Short had been having bedwetting episodes which she attributed to the lithium.  He has not consistently taken Saphris 15 mg at bedtime since the office visit in February. He completely stopped lithium January 30, 2020 U frequency is much better and less thirsty.  Bedwetting resolved.  Reduced BM to 1-2 times daily.  Gassy.  Glucose is high.  B is hospitalized with unknown disease.  Insulin adjusted. Mental status is best it's ever been.  Less mania.  Less obsessing.  No worsening mania. Is taking Saphris 15 mg HS> Plan no med changes.  04/10/2020 appointment with the following noted: No longer on lithium.  Still frequent urination and thirst DT DM poorly controlled despite meds.  Endo says little else to take bc history of pancreatitis.  Says he goes to urinate q 15 mins.  Patient reports stable mood and deniesr irritable moods.  Patient reports some recent difficulty with anxiety with GM living with them and she's difficult and he is also somewhat fearful of Covid for the sake of his family..  Patient denies difficulty with sleep initiation or maintenance; 6-7hours. Delays sleep phase chronically.  Denies appetite disturbance.  Patient reports that energy and motivation have been good.  Patient denies any difficulty with concentration.  Patient denies any suicidal ideation.  Admits he's easily stressed.  Works with [employer].  Prior psychiatric medications: risperidone with cognitive side effects, Abilify,  Zyprexa, Seroquel 600 mg, perphenazine, Saphris 20 , haloperidol, Geodon, Saphris, Vraylar.   He's prone to EPS. Depakote (Note patient had severe pancreatitis from Depakote.), Equetro, N-acetylcysteine with minimal benefit, , Amantadine with vomiting.  Sertraline and lexapro sexual SE,    Propranolol for tremor, lithium 1500,   Review of Systems:  Review of Systems  Endocrine: Positive for polydipsia and polyuria.  Genitourinary: Negative for frequency.  Neurological: Negative for tremors and weakness.  Psychiatric/Behavioral: Positive for decreased concentration. Negative for agitation, behavioral problems, confusion, dysphoric mood, hallucinations, self-injury, sleep disturbance and suicidal ideas. The patient is nervous/anxious. The patient is not hyperactive.     Medications: I have reviewed the patient's current medications.  Current Outpatient Medications  Medication Sig Dispense Refill  . asenapine (SAPHRIS) 5 MG SUBL 24 hr tablet Place 1 tablet (5 mg total) under the tongue 3 (three) times daily. 270 tablet 0  . Blood Glucose Monitoring Suppl (ONETOUCH VERIO FLEX SYSTEM) w/Device KIT Use onetouch verio flex meter to check blood sugar 3 times daily. DX:E11.65 1 kit 0  . fenofibrate (TRICOR) 145 MG tablet Take 1 tablet (145 mg total) by mouth daily. 90 tablet 3  . glucose blood test strip Use OneTouch Verio strips as instructed to check blood sugar 4 times daily. DX:E11.65 400 each 1  . insulin aspart (NOVOLOG FLEXPEN) 100 UNIT/ML FlexPen Inject 30 Units into the skin 3 (three) times daily with meals. 81 mL 2  . Insulin Pen Needle (BD PEN NEEDLE NANO U/F) 32G X 4 MM MISC USE 4 TIMES DAILY WITH INSULIN PENS 400 each 3  . LANTUS SOLOSTAR 100 UNIT/ML Solostar Pen Inject 36 Units into the skin at bedtime. 32.4 mL 3  . metFORMIN (GLUCOPHAGE-XR) 500 MG 24 hr tablet Take 4 tablets (2,000 mg total) by mouth daily with supper. 360 tablet 2  . OneTouch Delica Lancets 64B MISC 1 each by Does not apply route 4 (four) times daily. Use Onetouch Delica lancets to check blood sugar 4 times daily. DX:E11.65 400 each 59  . Semaglutide,0.25 or 0.5MG/DOS, (OZEMPIC, 0.25 OR 0.5 MG/DOSE,) 2 MG/1.5ML SOPN Inject 0.25 mg into the skin once a week. 1 pen 5  . loxapine (LOXITANE) 50 MG capsule  1 at night for 1 week, then 2 at night, then 2 nightly 90 capsule 1   No current facility-administered medications for this visit.    Medication Side Effects: Other: less tremor  Allergies:  Allergies  Allergen Reactions  . Divalproex Sodium Other (See Comments)    unknown  . Methylphenidate Hcl     Aggravate bipolar  . Oxycodone Hcl     REACTION: hallucinations  . Paroxetine     Aggravate bipolar disorder    Past Medical History:  Diagnosis Date  . 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 Bipolar affective disorder ([place])   . DM w/o Complication Type II 05/29/7792  . HYPERTRIGLYCERIDEMIA 10/14/2010  . Nocturia 07/04/2010  . PILAR CYST 08/03/2007   Cyst in groin-states comes up when blood sugar gets high. Recurs if cannot walk for a week.     . TOBACCO ABUSE, HX OF 07/31/2009    Family History  Problem Relation Age of Onset  . Diabetes Mother   . Breast cancer Mother        was told not hereditary  . Cirrhosis Mother  fatty liver  . Diabetes Father   . Melanoma Maternal Uncle   . Colon cancer Neg Hx   . Stomach cancer Neg Hx   . Pancreatitis Neg Hx   . Heart disease Neg Hx   . Kidney disease Neg Hx   . Liver disease Neg Hx     Social History   Socioeconomic History  . Marital status: Single    Spouse name: Not on file  . Number of children: Not on file  . Years of education: Not on file  . Highest education level: Not on file  Occupational History  . Not on file  Tobacco Use  . Smoking status: Former Smoker    Packs/day: 2.50    Years: 10.00    Pack years: 25.00    Types: Cigarettes    Quit date: 10/27/2006    Years since quitting: 13.4  . Smokeless tobacco: Never Used  Substance and Sexual Activity  . Alcohol use: No    Alcohol/week: 0.0 standard drinks    Comment: none at all  . Drug use: No  . Sexual activity: Not on file  Other Topics Concern  . Not on file  Social History Narrative   Single. Not dating currently. No kids.    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 Difficulty of Paying Living Expenses:   Food Insecurity:   . Worried About Charity fundraiser in the Last Year:   . Arboriculturist in the Last Year:   Transportation Needs:   . Film/video editor (Medical):   Marland Kitchen Lack of Transportation (Non-Medical):   Physical Activity:   . Days of Exercise per Week:   . Minutes of Exercise per Session:   Stress:   . Feeling of Stress :   Social Connections:   . Frequency of Communication with Friends and Family:   . Frequency of Social Gatherings with Friends and Family:   . Attends Religious Services:   . Active Member of Clubs or Organizations:   . Attends Archivist Meetings:   Marland Kitchen Marital Status:   Intimate Partner Violence:   . Fear of Current or Ex-Partner:   . Emotionally Abused:   Marland Kitchen Physically Abused:   . Sexually Abused:     Past Medical History, Surgical history, Social history, and Family history were reviewed and updated as appropriate.   Please see review of systems for further details on the patient's review from today.   Objective:   Physical Exam:  There were no vitals taken for this visit.  Physical Exam Neurological:     Mental Status: He is alert and oriented to person, place, and time.     Cranial Nerves: No dysarthria.  Psychiatric:        Attention and Perception: Attention normal.        Mood and Affect: Mood is not anxious or depressed.        Speech: Speech normal. Speech is not rapid and pressured.        Behavior: Behavior is not agitated or hyperactive. Behavior is cooperative.        Thought Content: Thought content is not paranoid or delusional. Thought content does not include homicidal or suicidal ideation. Thought content does not include homicidal  or suicidal plan.        Cognition and Memory: Cognition  and memory normal.     Comments: No paranoia.  No sig anxiety.  No mania Insight and judgment fair. Chronically self-preoccupied and hyperthymic style     Lab Review:     Component Value Date/Time   NA 140 06/18/2018 1502   K 4.5 06/18/2018 1502   CL 107 06/18/2018 1502   CO2 19 (L) 06/18/2018 1502   GLUCOSE 129 (H) 06/18/2018 1502   BUN 14 06/18/2018 1502   CREATININE 1.24 06/18/2018 1502   CALCIUM 10.3 06/18/2018 1502   PROT 8.3 (H) 06/18/2018 1502   ALBUMIN 4.4 02/10/2017 1631   AST 26 06/18/2018 1502   ALT 24 06/18/2018 1502   ALKPHOS 54 02/10/2017 1631   BILITOT 0.4 06/18/2018 1502   GFRNONAA 84 04/02/2018 1551   GFRAA 97 04/02/2018 1551       Component Value Date/Time   WBC 11.3 (H) 06/27/2016 1441   RBC 4.52 06/27/2016 1441   HGB 13.5 06/27/2016 1441   HCT 39.0 06/27/2016 1441   PLT 248.0 06/27/2016 1441   MCV 86.3 06/27/2016 1441   MCH 29.6 01/28/2014 1250   MCHC 34.5 06/27/2016 1441   RDW 13.2 06/27/2016 1441   LYMPHSABS 1.9 06/27/2016 1441   MONOABS 0.8 06/27/2016 1441   EOSABS 0.2 06/27/2016 1441   BASOSABS 0.1 06/27/2016 1441    Lithium Lvl  Date Value Ref Range Status  09/09/2019 1.1 0.6 - 1.2 mmol/L Final    12/14/19 lithium level 0.6 (trough 13 hours) , normal BMP and calcium   No results found for: PHENYTOIN, PHENOBARB, VALPROATE, CBMZ   .res Assessment: Plan:    Cody Short was seen today for medication problem, manic behavior and follow-up.  Diagnoses and all orders for this visit:  Schizoaffective disorder, bipolar type ([place]) -     loxapine (LOXITANE) 50 MG capsule; 1 at night for 1 week, then 2 at night, then 2 nightly  Generalized anxiety disorder -     loxapine (LOXITANE) 50 MG capsule; 1 at night for 1 week, then 2 at night, then 2 nightly  Panic disorder with agoraphobia -     loxapine (LOXITANE) 50 MG capsule; 1 at night for 1 week, then 2 at night, then 2 nightly  Insomnia due to mental condition -     loxapine (LOXITANE) 50  MG capsule; 1 at night for 1 week, then 2 at night, then 2 nightly   Cody Short has had significant disruptive mood and psychotic symptoms and substance abuse over the course of his treatment here.  It has been evident in chronic occupational dysfunction and some relational problems.  He is also been very prone to EPS making it difficult to get adequate mood stabilization.   Has a history of severe pancreatitis from Depakote.  He did not have an adequate response to carbamazepine and lithium was insufficient to control his symptoms and monotherapy.  Lithium was stopped early April 2021 bc of diabetes insipidus.  Disc risk mania occurring is still a concern because he is only been off the lithium for short time frame.  Discussed potential metabolic side effects associated with atypical antipsychotics, as well as potential risk for movement side effects. Advised pt to contact office if movement side effects occur.   He has a low stress tolerance.  He can be easily agitated in public. This is better at present.  He has continued abstinence from marijuana is encouraged.  He  has a history of cannabinoid hyperemesis which finally convinced him to discontinue the marijuana.  His mood disorder and thought disorganization have improved since being off the marijuana.  He has a very long history of heavy marijuana dependence.  Wean Saphris in crossover to loxapine 150 mg HS over 2 weeks bc he and endocrinologist believe it's likely Saphris is cause of the inability to get his glucose to acceptable levels. He is aware that all antipsychotic mood stabilizers and antipsychotics in general have the same warning about blood sugar elevation and therefore it is not possible to choose 1 versus another with absolute confidence that his blood sugar will return to normal.  Given how sensitive he is to extraparametal side effects loxapine is the next best choice and it is generic.  Follow-up 8 weeks.  Pt has some chronic  instability.  Lynder Parents, MD, D FA PA  Please see After Visit Summary for patient specific instructions.  Future Appointments  Date Time Provider [place]  05/10/2020  3:20 PM Philemon Kingdom, MD [id number] None    No orders of the defined types were placed in this encounter.     -------------------------------

## 2020-04-12 ENCOUNTER — Other Ambulatory Visit: Payer: Self-pay

## 2020-04-12 DIAGNOSIS — E1165 Type 2 diabetes mellitus with hyperglycemia: Secondary | ICD-10-CM

## 2020-04-12 NOTE — Progress Notes (Signed)
error 

## 2020-04-17 ENCOUNTER — Encounter: Payer: Self-pay | Admitting: Internal Medicine

## 2020-04-17 ENCOUNTER — Other Ambulatory Visit: Payer: Self-pay

## 2020-04-17 DIAGNOSIS — E1165 Type 2 diabetes mellitus with hyperglycemia: Secondary | ICD-10-CM

## 2020-04-17 MED ORDER — BD PEN NEEDLE NANO U/F 32G X 4 MM MISC: 3 refills | Status: DC

## 2020-05-01 ENCOUNTER — Telehealth: Payer: Self-pay | Admitting: Psychiatry

## 2020-05-01 NOTE — Telephone Encounter (Signed)
Patient left a message stating that he wants to know if the medicine he is taking 2 mg if that is suppose to make you sleepy. Her said that when he takes the medicine he sleeps nine hours and then gets up has breakfast and than goes back to sleep for five more hours. He wants to know is this normal. Please call him at [phone]

## 2020-05-02 NOTE — Telephone Encounter (Signed)
Left message to call back with clarification which medication he's referring to.

## 2020-05-03 ENCOUNTER — Other Ambulatory Visit: Payer: Self-pay | Admitting: Psychiatry

## 2020-05-03 DIAGNOSIS — F4001 Agoraphobia with panic disorder: Secondary | ICD-10-CM

## 2020-05-03 DIAGNOSIS — F250 Schizoaffective disorder, bipolar type: Secondary | ICD-10-CM

## 2020-05-03 DIAGNOSIS — F5105 Insomnia due to other mental disorder: Secondary | ICD-10-CM

## 2020-05-03 DIAGNOSIS — F411 Generalized anxiety disorder: Secondary | ICD-10-CM

## 2020-05-05 ENCOUNTER — Encounter: Payer: Self-pay | Admitting: Internal Medicine

## 2020-05-07 ENCOUNTER — Other Ambulatory Visit: Payer: Self-pay | Admitting: Psychiatry

## 2020-05-07 ENCOUNTER — Telehealth: Payer: Self-pay | Admitting: Psychiatry

## 2020-05-07 ENCOUNTER — Encounter: Payer: Self-pay | Admitting: Internal Medicine

## 2020-05-07 NOTE — Telephone Encounter (Signed)
Patient also said that when he lays down he has trouble breathing

## 2020-05-07 NOTE — Progress Notes (Signed)
Received labs by PCP from 05/03/2020: - TSH normal, at 0.528 - CMP normal except: Glu 186, BUN/Cr 9/1.19, GFR 75, AST/ALT 53/64 (11/23/2019: 48/41) - Lipids:225/345/38/126

## 2020-05-07 NOTE — Telephone Encounter (Signed)
Patient called and said that he is taking loxapine 2 mg and he sleeps 9 hours gets up to eat and goes back to sleep for 4 or 5 more hours. He wants to know is this normal? Since this a new medicine that dr. Jennelle Human put him on. Please give him a call for336 [phone]

## 2020-05-07 NOTE — Telephone Encounter (Signed)
He can reduce Loxapine to 1 of the 50 mg capsules.  Watch for mood swings and pay attention to whether the sleep patterns change.

## 2020-05-08 NOTE — Telephone Encounter (Signed)
Instructed patient to decrease loxapine 50 mg capsules 1 at night. He agreed and aware to watch any changes in his mood and sleep patterns.   Discussed breathing issue, said it went away last night.  Informed him not sure what that could be coming from but if it worsens to call us back or notify his PCP. He agreed.

## 2020-05-10 ENCOUNTER — Encounter: Payer: Self-pay | Admitting: Internal Medicine

## 2020-05-10 ENCOUNTER — Other Ambulatory Visit: Payer: Self-pay

## 2020-05-10 ENCOUNTER — Ambulatory Visit (INDEPENDENT_AMBULATORY_CARE_PROVIDER_SITE_OTHER): Payer: 59 | Admitting: Internal Medicine

## 2020-05-10 VITALS — BP 140/118 | HR 112 | Ht 69.25 in | Wt 218.0 lb

## 2020-05-10 DIAGNOSIS — E781 Pure hyperglyceridemia: Secondary | ICD-10-CM | POA: Diagnosis not present

## 2020-05-10 DIAGNOSIS — E1165 Type 2 diabetes mellitus with hyperglycemia: Secondary | ICD-10-CM

## 2020-05-10 DIAGNOSIS — E669 Obesity, unspecified: Secondary | ICD-10-CM | POA: Diagnosis not present

## 2020-05-10 LAB — POCT GLYCOSYLATED HEMOGLOBIN (HGB A1C): Hemoglobin A1C: 8.6 % — AB (ref 4.0–5.6)

## 2020-05-10 MED ORDER — LANTUS SOLOSTAR 100 UNIT/ML ~~LOC~~ SOPN: 30.0000 [IU] | PEN_INJECTOR | Freq: Every day | SUBCUTANEOUS | 3 refills | Status: DC

## 2020-05-10 MED ORDER — NOVOLOG FLEXPEN 100 UNIT/ML ~~LOC~~ SOPN: 25.0000 [IU] | PEN_INJECTOR | Freq: Three times a day (TID) | SUBCUTANEOUS | 3 refills | Status: DC

## 2020-05-10 NOTE — Addendum Note (Signed)
Addended by: Darliss Ridgel I on: 05/10/2020 03:52 PM   Modules accepted: Orders

## 2020-05-10 NOTE — Progress Notes (Addendum)
Patient ID: Cody Short, male   DOB: 02-18-1976, 44 y.o.   MRN: [id number]  This visit occurred during the SARS-CoV-2 public health emergency.  Safety protocols were in place, including screening questions prior to the visit, additional usage of staff PPE, and extensive cleaning of exam room while observing appropriate contact time as indicated for disinfecting solutions.   HPI: Cody Short is a 44 y.o.-year-old male, presenting for follow-up for DM 2, dx in ~2010, insulin-dependent, uncontrolled, without long term complications. Last visit 4 months ago.  He stopped lithium approximately 1 month prior to our last visit and his frequent urination resolved.  While transitioning off lithium, his Saphris dose was increased blood sugar started to increase afterwards so he was in the process of decreasing Saphris >> now off for 2.5 weeks. He started Loxitane. This is making him sleepier, but not causing an increase in blood sugars.  Sugars improving he was able to reduce his insulin doses  Started to avoid fatty foods and sweets. He will see nutrition in 2 weeks.  Reviewed HbA1c levels: Lab Results  Component Value Date   HGBA1C 7.9 (A) 02/14/2020   HGBA1C 7.7 (A) 06/14/2019   HGBA1C 6.8 (A) 11/29/2018  12/22/2018: HbA1c 7.2%  Pt is on a regimen of: - Metformin ER 2000 mg at bedtime - Novolog before meals: 20-25 units 2-3x a day >> 20 to 25 >> 35-45 >> 25-35 units - Lantus 10-15 (25) >> 36 >> 15-25 units at bedtime He could not tolerate Ozempic >> nausea, AP We had to stop Jardiance due to increased urination. He was previously on Lantus but stopped since sugars improved. He tried Glipizide >> hypoglycemia in the 40s repeatedly. Lowest: 25. Also, Glipizide 2.5 mg in am >> nausea, vomiting, constipation We tried Invokana >> bothersome urination (when he was working) >> had to stop. He had pancreatitis 2/2 Depakote and HTG in the past. He stopped Actos b/c stomach pain.  We  stopped Cycloset >> inefficient, could not use b/c price.  Pt checks his sugars 4 times a day - off Saphris: - 5-6 pm:  106-148, 160-202 w/o Lantus >> n/c >> 98-247 >> 114-175 - 2h after b'fast:  96-200 >> 76-224  >> n/c >> 247 >> n/c - lunch:  102-157, 190 >> 130-160, 223 >> n/c >> 219 - 2h after lunch: 72-220 (ave: 120-150) >> 176-314, 399 >> 99-327 - before dinner:   92-218, 306 >> n/c >> 230, 387 >> 149-259 - 2h after dinner: 108-242 (ave 150-160) >> 160-326 >> 133--269 - bedtime:  84, 122-242, 273 >> 115-182 >> 131-220 >> 119-207 - nighttime:1 111-270, 309 >> 89-181 >> see above >> 134 Lowest sugar was  96 >> 76 >> 72 >> 50s >> 75; he has hypoglycemia awareness in the 70s. Highest sugar was  300 >> 300s >> 242 >> 399 >> 468.  Glucometer: FreeStyle  No history of CKD, last BUN/creatinine:  12/14/2019 8/1.09, GFR 84, glucose 102 12/22/2018: 15/1.13, GFR 81, glucose 110 Lab Results  Component Value Date   BUN 14 06/18/2018   CREATININE 1.24 06/18/2018   He did have elevated ACR in the past: 12/22/2018: 262 Lab Results  Component Value Date   MICRALBCREAT 26.7 04/02/2018   MICRALBCREAT 3.7 02/10/2017   MICRALBCREAT 27.3 12/17/2015   MICRALBCREAT 4.0 02/22/2015   MICRALBCREAT 1.0 01/03/2014   He has mixed hyperlipidemia;  last set of lipids: 05/03/2020: 225/345/38/126 11/23/2019: 109/148/34/49 12/22/2018: 220/359/41/145 Lab Results  Component Value Date   CHOL  197 04/02/2018   HDL 32.70 (L) 04/02/2018   LDLCALC 67 02/02/2014   LDLDIRECT 120.0 04/02/2018   TRIG 341.0 (H) 04/02/2018   CHOLHDL 6 04/02/2018  On fenofibrate  - last eye exam was in 03/2017: No DR  -He has numbness but no tingling in his feet  In 2010, he developed pancreatitis from Depakote.  + rash  - scrotal - improved (almost) on Nystatin + Triamcinolone.  He may need refills in the future. He has recurrent groin furunculosis.  He is on Metamucil for constipation due to Saphris  TSH levels were  normal: 05/03/2020: TSH 0.528 12/22/2018: 3.648  He continues to exercise by walking and running on the treadmill and lifting weights.  ROS: Constitutional: no weight gain/no weight loss, no fatigue, no subjective hyperthermia, no subjective hypothermia Eyes: no blurry vision, no xerophthalmia ENT: no sore throat, no nodules palpated in neck, no dysphagia, no odynophagia, no hoarseness Cardiovascular: no CP/no SOB/no palpitations/no leg swelling Respiratory: no cough/no SOB/no wheezing Gastrointestinal: no N/no V/no D/no C/no acid reflux Musculoskeletal: no muscle aches/no joint aches Skin: no rashes, no hair loss Neurological: no tremors/no numbness/no tingling/no dizziness  I reviewed pt's medications, allergies, PMH, social hx, family hx, and changes were documented in the history of present illness. Otherwise, unchanged from my initial visit note.  Past Medical History:  Diagnosis Date  . 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 Bipolar affective disorder ([place])   . DM w/o Complication Type II 07/01/6212  . HYPERTRIGLYCERIDEMIA 10/14/2010  . Nocturia 07/04/2010  . PILAR CYST 08/03/2007   Cyst in groin-states comes up when blood sugar gets high. Recurs if cannot walk for a week.     . TOBACCO ABUSE, HX OF 07/31/2009   Past Surgical History:  Procedure Laterality Date  . pilar cystectomy     History   Social History  . Marital Status: Single    Spouse Name: N/A  . Number of Children: 0   Occupational History  .    Social History Main Topics  . Smoking status: Former Smoker - quit in 2010  . Smokeless tobacco: Never Used  . Alcohol Use: No     Comment: Cody Short at all  . Drug Use: No   Current Outpatient Medications  Medication Sig Dispense Refill  . asenapine (SAPHRIS) 5 MG SUBL 24 hr tablet Place 1 tablet (5 mg total) under the tongue 3 (three) times daily. 270 tablet 0  . Blood Glucose Monitoring Suppl (ONETOUCH VERIO FLEX SYSTEM) w/Device KIT Use onetouch verio flex meter to  check blood sugar 3 times daily. DX:E11.65 1 kit 0  . fenofibrate (TRICOR) 145 MG tablet Take 1 tablet (145 mg total) by mouth daily. 90 tablet 3  . glucose blood test strip Use OneTouch Verio strips as instructed to check blood sugar 4 times daily. DX:E11.65 400 each 1  . insulin aspart (NOVOLOG FLEXPEN) 100 UNIT/ML FlexPen Inject 30 Units into the skin 3 (three) times daily with meals. 81 mL 2  . Insulin Pen Needle (BD PEN NEEDLE NANO U/F) 32G X 4 MM MISC USE 4 TIMES DAILY WITH INSULIN PENS 400 each 3  . LANTUS SOLOSTAR 100 UNIT/ML Solostar Pen Inject 36 Units into the skin at bedtime. 32.4 mL 3  . loxapine (LOXITANE) 50 MG capsule TAKE 2 CAPS AT NIGHT 180 capsule 0  . metFORMIN (GLUCOPHAGE-XR) 500 MG 24 hr tablet Take 4 tablets (2,000 mg total) by mouth daily with supper. 360 tablet 2  . OneTouch Delica Lancets 08M MISC  1 each by Does not apply route 4 (four) times daily. Use Onetouch Delica lancets to check blood sugar 4 times daily. DX:E11.65 400 each 73  . Semaglutide,0.25 or 0.5MG/DOS, (OZEMPIC, 0.25 OR 0.5 MG/DOSE,) 2 MG/1.5ML SOPN Inject 0.25 mg into the skin once a week. 1 pen 5   No current facility-administered medications for this visit.    No current facility-administered medications on file prior to visit.    Allergies  Allergen Reactions  . Divalproex Sodium Other (See Comments)    unknown  . Methylphenidate Hcl     Aggravate bipolar  . Oxycodone Hcl     REACTION: hallucinations  . Paroxetine     Aggravate bipolar disorder   Family History  Problem Relation Age of Onset  . Diabetes Mother   . Breast cancer Mother        was told not hereditary  . Cirrhosis Mother        fatty liver  . Diabetes Father   . Melanoma Maternal Uncle   . Colon cancer Neg Hx   . Stomach cancer Neg Hx   . Pancreatitis Neg Hx   . Heart disease Neg Hx   . Kidney disease Neg Hx   . Liver disease Neg Hx    PE: BP (!) 140/118   Pulse (!) 112   Ht 5' 9.25" (1.759 m)   Wt 218 lb (98.9  kg)   SpO2 97%   BMI 31.96 kg/m  Body mass index is 31.96 kg/m. Wt Readings from Last 3 Encounters:  05/10/20 218 lb (98.9 kg)  02/14/20 220 lb (99.8 kg)  06/14/19 219 lb (99.3 kg)   Constitutional: overweight, in NAD Eyes: PERRLA, EOMI, no exophthalmos ENT: moist mucous membranes, no thyromegaly, no cervical lymphadenopathy Cardiovascular: tachycardia, RR, No MRG Respiratory: CTA B Gastrointestinal: abdomen soft, NT, ND, BS+ Musculoskeletal: no deformities, strength intact in all 4 Skin: moist, warm, no rashes Neurological: no tremor with outstretched hands, DTR normal in all 4  ASSESSMENT: 1. DM2, insulin-dependent, uncontrolled, without long term complications, but with hyperglycemia  His test were negative for type 1 diabetes: Component     Latest Ref Rng 05/28/2015  C-Peptide     0.80 - 3.90 ng/mL 2.88  Glucose, Fasting     65 - 99 mg/dL 120 (H)  Glutamic Acid Decarb Ab     <5 IU/mL <5  Pancreatic Islet Cell Antibody     < 5 JDF Units <5   - His diabetes is difficult to manage because of multiple intolerances: - We tried to add Invokana but he did not tolerated due to increased urination.  - We cannot use a DPP 4 inhibitor or a GLP-1 receptor agonist due to his history of pancreatitis.  - He had to stop Actos >>  abd. Pain - we stopped Cycloset >> expensive and not effective - we tried Glipizide >> GI sxs: N/V/D - he tried the Dexcom G6 CGM but he felt that this was giving him a lot of errors and stopped  2. HTG  3.  Obesity class I  PLAN:  1. Patient with longstanding, uncontrolled type 2 diabetes, with improved control after starting Jardiance which we had to stop, unfortunately, due to very frequent urination.  He is frequent urination was subsequently determined to be caused by lithium.  Lithium was stopped before last visit but Saphris had to be increased afterwards.  However, Saphris increased his blood sugars.  He had a hard time trying to manage the blood  sugars after the increase in dose of Saphris, using higher doses of insulin.  We did discuss at last visit about adding a low-dose of GLP-1 receptor agonist but I advised him that we have to be very careful due to his history of Depakote induced pancreatitis.  He tried Ozempic >> nausea and AP >> stopped -He recently had labs with PCP and his triglycerides and the rest of the lipid fractions were worse compared to before.  Also, he had high AST and ALT.  Since then, he started on a diet approximately a week ago and sugars started to improve significantly.  He started to reduce his insulin doses.  At this visit, he is still taking a higher daily dose of NovoLog compared to Lantus, discussed that ideally this would be approximately equal overall with slightly higher prep on the rest of NovoLog per day.  NovoLog for now, I advised him to increase the Lantus slightly while continuing to reduce the NovoLog.  We will continue Metformin. - I advised him to: Patient Instructions  Please continue: - Metformin ER 2000 mg with dinner  Try to reduce: - Novolog before meals: 20-25 units 2-3x a day  Increase: - Lantus 25 units at bedtime  Please return in 3 to 4 months with your sugar log.  - we checked his HbA1c: 8.6% (higher) - advised to check sugars at different times of the day - 3-4x a day, rotating check times - advised for yearly eye exams >> he is not UTD - We will check an ACR today. - return to clinic in 3-4 months  2. HL -He has a history of hypertriglyceridemia -She had a repeat lipid panel on 05/03/2020 and his triglycerides were higher than before, at 345.  Also, his AST and ALT were elevated.  He immediately started on a diet.  He also continues to exercise. -He continues fenofibrate without side effects  3.  Obesity class I -Continues Metformin which is also appetite reducing and weight stabilizing long-term -He started to work on his diet and continues to exercise -At last visit, I  suggested to [place] which should have helped with weight loss, but he could not tolerate it  Component     Latest Ref Rng & Units 05/10/2020          Microalb, Ur     0.0 - 1.9 mg/dL 44.0 (H)  Creatinine,U     mg/dL 124.8  MICROALB/CREAT RATIO     0.0 - 30.0 mg/g 35.2 (H)  Hemoglobin A1C     4.0 - 5.6 % 8.6 (A)  ACR is high.  This will most likely improve with improvement of his diabetes.  We will recheck at next visit  Philemon Kingdom, MD PhD [facility] Endocrinology

## 2020-05-10 NOTE — Patient Instructions (Signed)
Please continue: - Metformin ER 2000 mg with dinner  Try to reduce: - Novolog before meals: 20-25 units 2-3x a day  Increase: - Lantus 25 units at bedtime  Please return in 3 to 4 months with your sugar log.

## 2020-05-11 LAB — MICROALBUMIN / CREATININE URINE RATIO
Creatinine,U: 124.8 mg/dL
Microalb Creat Ratio: 35.2 mg/g — ABNORMAL HIGH (ref 0.0–30.0)
Microalb, Ur: 44 mg/dL — ABNORMAL HIGH (ref 0.0–1.9)

## 2020-05-14 ENCOUNTER — Telehealth: Payer: Self-pay | Admitting: Psychiatry

## 2020-05-14 NOTE — Telephone Encounter (Signed)
I don't know if it is making him sleepy but the dose is low and we cannot change or reduce it further until his next appt.

## 2020-05-14 NOTE — Telephone Encounter (Signed)
Patient called and said that he is taking the loxapine 50 mg and he is taking 1 cap a night. He says he takes it and sleeps nine hours gets up and eats and than he will take a nap and sleep another 4 hours. Is this normal? Please give him a call at [phone]

## 2020-05-15 NOTE — Telephone Encounter (Signed)
Informed him dose can not be decreased at this time. Continue same dose and will be discussed at next apt. Asked to be transferred for a sooner apt.

## 2020-06-07 ENCOUNTER — Telehealth (INDEPENDENT_AMBULATORY_CARE_PROVIDER_SITE_OTHER): Payer: 59 | Admitting: Psychiatry

## 2020-06-07 ENCOUNTER — Encounter: Payer: Self-pay | Admitting: Psychiatry

## 2020-06-07 DIAGNOSIS — F5105 Insomnia due to other mental disorder: Secondary | ICD-10-CM | POA: Diagnosis not present

## 2020-06-07 DIAGNOSIS — F250 Schizoaffective disorder, bipolar type: Secondary | ICD-10-CM | POA: Diagnosis not present

## 2020-06-07 DIAGNOSIS — F411 Generalized anxiety disorder: Secondary | ICD-10-CM | POA: Diagnosis not present

## 2020-06-07 DIAGNOSIS — F5113 Hypersomnia due to other mental disorder: Secondary | ICD-10-CM

## 2020-06-07 DIAGNOSIS — F4001 Agoraphobia with panic disorder: Secondary | ICD-10-CM

## 2020-06-07 DIAGNOSIS — F1211 Cannabis abuse, in remission: Secondary | ICD-10-CM

## 2020-06-07 MED ORDER — LOXAPINE SUCCINATE 10 MG PO CAPS: 10.0000 mg | ORAL_CAPSULE | Freq: Every day | ORAL | 0 refills | Status: DC

## 2020-06-07 NOTE — Progress Notes (Signed)
Cody Short [id number] 11/28/1975 44 y.o.   Virtual Visit via [place]  I connected with pt by WebEx and verified that I am speaking with the correct person using two identifiers.   I discussed the limitations, risks, security and privacy concerns of performing an evaluation and management service by [facility] and the availability of in person appointments. I also discussed with the patient that there may be a patient responsible charge related to this service. The patient expressed understanding and agreed to proceed.  I discussed the assessment and treatment plan with the patient. The patient was provided an opportunity to ask questions and all were answered. The patient agreed with the plan and demonstrated an understanding of the instructions.   The patient was advised to call back or seek an in-person evaluation if the symptoms worsen or if the condition fails to improve as anticipated.  30 minutes video encounter from 3:30 PM to 4:00 PM.   Subjective:   Patient ID:  Cody Short is a 44 y.o. (DOB 06-Jun-1976) male.  Chief Complaint:  Chief Complaint  Patient presents with  . Follow-up  . Medication Problem     Cody Short presents to the office today for follow-up of several psychiatric diagnoses ...    seen August 2020.  For obsessive anxiety  Switched Lexapro 5 mg daily on May 18.  But he stated it made him hungry and so it was stopped in August.  He continued on Saphris 10 mg nightly, lithium 1200 mg daily, propranolol as needed tremor.  Lithium level was ordered.  He called Sept saying he was manic.  Had a lithium level 0.7 and so dose was increased to 1500 mg daily.    Last seen December 21, 2019.  He had some ongoing manic symptoms and Saphris was increased to 15 mg nightly.  02/07/2020 appointment with the following noted: He has made multiple phone calls to the office since that date.  He decided he wanted to stop lithium because of urinary  problems.  He was warned this could lead to mania but he wanted to pursue it anyway.  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  Cody Short had been having bedwetting episodes which she attributed to the lithium.  He has not consistently taken Saphris 15 mg at bedtime since the office visit in February. He completely stopped lithium January 30, 2020 U frequency is much better and less thirsty.  Bedwetting resolved.  Reduced BM to 1-2 times daily.  Gassy.  Glucose is high.  B is hospitalized with unknown disease.  Insulin adjusted. Mental status is best it's ever been.  Less mania.  Less obsessing.  No worsening mania. Is taking Saphris 15 mg HS> Plan no med changes.  04/10/2020 appointment with the following noted: No longer on lithium.  Still frequent urination and thirst DT DM poorly controlled despite meds.  Endo says little else to take bc history of pancreatitis.  Says he goes to urinate q 15 mins. Plan: Wean Saphris in crossover to loxapine 150 mg HS over 2 weeks bc he and endocrinologist believe it's likely Saphris is cause of the inability to get his glucose to acceptable levels.   06/07/20 appt with the following noted: He's called a couple of times CO sleepiness and reduced loxapine to 50 mg HS. Started Vegan diet and BS is better 4 weeks ago.  Thinks glucose is better off the Saphris. Lost 6 #.  CO sleepiness and sleep 16 hours   He thinks it also drove up pulse and blood pressure. Asks about taking Saphris 10 and a second medication. Mood has been fine.    Patient reports stable mood and deniesr irritable moods.  Denies appetite disturbance.  Patient reports that energy and motivation have been good.  Patient denies any difficulty with concentration.  Patient denies any suicidal ideation.  Admits he's easily stressed.  Works with [employer].  Prior psychiatric medications: risperidone with cognitive side effects, Abilify,  Zyprexa,  Seroquel 600 mg, perphenazine, Saphris 20 , haloperidol, Geodon, Saphris, Vraylar.   He's prone to EPS. Depakote (Note patient had severe pancreatitis from Depakote.), Equetro, N-acetylcysteine with minimal benefit, , Amantadine with vomiting.  Sertraline and lexapro sexual SE,   Propranolol for tremor, lithium 1500,   Review of Systems:  Review of Systems  Endocrine: Positive for polydipsia and polyuria.  Genitourinary: Negative for frequency.  Neurological: Negative for tremors and weakness.  Psychiatric/Behavioral: Positive for decreased concentration. Negative for agitation, behavioral problems, confusion, dysphoric mood, hallucinations, self-injury, sleep disturbance and suicidal ideas. The patient is nervous/anxious. The patient is not hyperactive.     Medications: I have reviewed the patient's current medications.  Current Outpatient Medications  Medication Sig Dispense Refill  . Blood Glucose Monitoring Suppl (ONETOUCH VERIO FLEX SYSTEM) w/Device KIT Use onetouch verio flex meter to check blood sugar 3 times daily. DX:E11.65 1 kit 0  . fenofibrate (TRICOR) 145 MG tablet Take 1 tablet (145 mg total) by mouth daily. 90 tablet 3  . glucose blood test strip Use OneTouch Verio strips as instructed to check blood sugar 4 times daily. DX:E11.65 400 each 1  . insulin aspart (NOVOLOG FLEXPEN) 100 UNIT/ML FlexPen Inject 25-35 Units into the skin 3 (three) times daily with meals. 60 mL 3  . Insulin Pen Needle (BD PEN NEEDLE NANO U/F) 32G X 4 MM MISC USE 4 TIMES DAILY WITH INSULIN PENS 400 each 3  . LANTUS SOLOSTAR 100 UNIT/ML Solostar Pen Inject 30 Units into the skin at bedtime. 32.4 mL 3  . loxapine (LOXITANE) 50 MG capsule TAKE 2 CAPS AT NIGHT (Patient taking differently: Take 50 mg by mouth at bedtime. TAKE 2 CAPS AT NIGHT) 180 capsule 0  . metFORMIN (GLUCOPHAGE-XR) 500 MG 24 hr tablet Take 4 tablets (2,000 mg total) by mouth daily with supper. 360 tablet 2  . OneTouch Delica Lancets 97L  MISC 1 each by Does not apply route 4 (four) times daily. Use Onetouch Delica lancets to check blood sugar 4 times daily. DX:E11.65 400 each 11   No current facility-administered medications for this visit.    Medication Side Effects: Other: less tremor  Allergies:  Allergies  Allergen Reactions  . Divalproex Sodium Other (See Comments)    unknown  . Methylphenidate Hcl     Aggravate bipolar  . Oxycodone Hcl     REACTION: hallucinations  . Ozempic (0.25 Or 0.5 Mg-Dose) [Semaglutide(0.25 Or 0.90m-Dos)] Nausea Only  . Paroxetine     Aggravate bipolar disorder    Past Medical History:  Diagnosis Date  . 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 Bipolar affective disorder ([place]   . DM w/o Complication Type II 98/06/2118 . HYPERTRIGLYCERIDEMIA 10/14/2010  . Nocturia 07/04/2010  . PILAR CYST 08/03/2007   Cyst in groin-states comes up when blood sugar gets high. Recurs if cannot walk for a week.     . TOBACCO ABUSE, HX OF 07/31/2009    Family History  Problem Relation  Age of Onset  . Diabetes Mother   . Breast cancer Mother        was told not hereditary  . Cirrhosis Mother        fatty liver  . Diabetes Father   . Melanoma Maternal Uncle   . Colon cancer Neg Hx   . Stomach cancer Neg Hx   . Pancreatitis Neg Hx   . Heart disease Neg Hx   . Kidney disease Neg Hx   . Liver disease Neg Hx     Social History   Socioeconomic History  . Marital status: Single    Spouse name: Not on file  . Number of children: Not on file  . Years of education: Not on file  . Highest education level: Not on file  Occupational History  . Not on file  Tobacco Use  . Smoking status: Former Smoker    Packs/day: 2.50    Years: 10.00    Pack years: 25.00    Types: Cigarettes    Quit date: 10/27/2006    Years since quitting: 13.6  . Smokeless tobacco: Never Used  Substance and Sexual Activity  . Alcohol use: No    Alcohol/week: 0.0 standard drinks    Comment: none at all  . Drug use: No  . Sexual  activity: Not on file  Other Topics Concern  . Not on file  Social History Narrative   Single. Not dating currently. No kids.    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 Difficulty of Paying Living Expenses:   Food Insecurity:   . Worried About Charity fundraiser in the Last Year:   . Arboriculturist in the Last Year:   Transportation Needs:   . Film/video editor (Medical):   Marland Kitchen Lack of Transportation (Non-Medical):   Physical Activity:   . Days of Exercise per Week:   . Minutes of Exercise per Session:   Stress:   . Feeling of Stress :   Social Connections:   . Frequency of Communication with Friends and Family:   . Frequency of Social Gatherings with Friends and Family:   . Attends Religious Services:   . Active Member of Clubs or Organizations:   . Attends Archivist Meetings:   Marland Kitchen Marital Status:   Intimate Partner Violence:   . Fear of Current or Ex-Partner:   . Emotionally Abused:   Marland Kitchen Physically Abused:   . Sexually Abused:     Past Medical History, Surgical history, Social history, and Family history were reviewed and updated as appropriate.   Please see review of systems for further details on the patient's review from today.   Objective:   Physical Exam:  There were no vitals taken for this visit.  Physical Exam Neurological:     Mental Status: He is alert and oriented to person, place, and time.     Cranial Nerves: No dysarthria.  Psychiatric:        Attention and Perception: Attention normal.        Mood and Affect: Mood is not anxious or depressed.        Speech: Speech normal. Speech is not rapid and pressured.        Behavior: Behavior is not agitated or  hyperactive. Behavior is cooperative.        Thought Content: Thought content is not paranoid or delusional. Thought  content does not include homicidal or suicidal ideation. Thought content does not include homicidal or suicidal plan.        Cognition and Memory: Cognition and memory normal.     Comments: No paranoia.  No sig anxiety.  No mania Insight and judgment fair. Chronically self-preoccupied and hyperthymic style     Lab Review:     Component Value Date/Time   NA 140 06/18/2018 1502   K 4.5 06/18/2018 1502   CL 107 06/18/2018 1502   CO2 19 (L) 06/18/2018 1502   GLUCOSE 129 (H) 06/18/2018 1502   BUN 14 06/18/2018 1502   CREATININE 1.24 06/18/2018 1502   CALCIUM 10.3 06/18/2018 1502   PROT 8.3 (H) 06/18/2018 1502   ALBUMIN 4.4 02/10/2017 1631   AST 26 06/18/2018 1502   ALT 24 06/18/2018 1502   ALKPHOS 54 02/10/2017 1631   BILITOT 0.4 06/18/2018 1502   GFRNONAA 84 04/02/2018 1551   GFRAA 97 04/02/2018 1551       Component Value Date/Time   WBC 11.3 (H) 06/27/2016 1441   RBC 4.52 06/27/2016 1441   HGB 13.5 06/27/2016 1441   HCT 39.0 06/27/2016 1441   PLT 248.0 06/27/2016 1441   MCV 86.3 06/27/2016 1441   MCH 29.6 01/28/2014 1250   MCHC 34.5 06/27/2016 1441   RDW 13.2 06/27/2016 1441   LYMPHSABS 1.9 06/27/2016 1441   MONOABS 0.8 06/27/2016 1441   EOSABS 0.2 06/27/2016 1441   BASOSABS 0.1 06/27/2016 1441    Lithium Lvl  Date Value Ref Range Status  09/09/2019 1.1 0.6 - 1.2 mmol/L Final    12/14/19 lithium level 0.6 (trough 13 hours) , normal BMP and calcium   No results found for: PHENYTOIN, PHENOBARB, VALPROATE, CBMZ   .res Assessment: Plan:    Taysom was seen today for follow-up and medication problem.  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  It has been evident in chronic occupational dysfunction and some relational problems.  He is also been very prone to EPS making it difficult to get adequate mood stabilization.   Has a history of severe pancreatitis from Depakote.  He did not have an adequate response to carbamazepine and lithium was insufficient to control his symptoms and monotherapy.  Lithium was stopped early April 2021 bc of diabetes insipidus.  Disc risk mania occurring is still a concern because he is only been off the lithium for short time frame.  Discussed potential metabolic side effects associated with atypical antipsychotics, as well as potential risk for movement side effects. Advised pt to contact office if movement side effects occur.   He has a low stress tolerance.  He can be easily agitated in public. This is better at present.  He has continued abstinence from marijuana is encouraged.  He has a history of cannabinoid hyperemesis which finally convinced him to discontinue the marijuana.  His mood disorder and thought disorganization have improved since being off the marijuana.  He has a very long history of heavy marijuana dependence.  He has frequent complaints of side effects  from medications that are sometimes difficult to understand such as concerns that loxapine may be increasing his blood pressure. That would be very unlikely. However it does appear he is excessively sedated from the loxapine so we will reduce the dose but in doing so the low-dose is unlikely to be of sufficient strength to control his mood.  He wants to restart Saphris 10 mg HS and use a 2nd mood stabilizer. Start Saphris and reduce Loxapine  To 10 mg at night DT prior failure of 10 Mg Saphris with lithium. Keep monitoring glucose.  He is aware that all antipsychotic mood stabilizers and antipsychotics in general have the same warning about blood sugar elevation and therefore it is not possible to choose 1  versus another with absolute confidence that his blood sugar will return to normal.  Given how sensitive he is to extraparametal side effects loxapine is the next best choice and it is generic.  Follow-up 8 weeks.  Pt has some chronic instability.  Lynder Parents, MD, D FA PA  Please see After Visit Summary for patient specific instructions.  Future Appointments  Date Time Provider [place]  09/14/2020  3:20 PM Philemon Kingdom, MD [facility] None    No orders of the defined types were placed in this encounter.     -------------------------------

## 2020-06-27 ENCOUNTER — Telehealth: Payer: Self-pay | Admitting: Psychiatry

## 2020-06-27 NOTE — Telephone Encounter (Signed)
Please advise 

## 2020-06-27 NOTE — Telephone Encounter (Signed)
He had wanted to go back on Saphris and reduce loxapine which we did.  Tell him to stop the Saphris and increase loxapine to 20 mg nightly.  He did not tolerate 50 mg of loxapine very well.

## 2020-06-27 NOTE — Telephone Encounter (Signed)
Pt called to report Loxapine & Saphris is causing his BP to be very high and Saphris is causing blood sugar to be high. Apt 10/18. Need changes before then. Call back @ [phone]

## 2020-06-27 NOTE — Telephone Encounter (Signed)
Spoke with Cody Short and he will stop the Saphris, but feels the increase in the Loxapine to 20 mg will increase his blood sugars more. Explained that with coming off the Saphris he needs to increase it a bit or he will not be stable with his moods. Advised him to stop the Saphris and would check with Dr. Jennelle Human about any other recommendations.

## 2020-06-28 NOTE — Telephone Encounter (Signed)
Call to RS earlier as available

## 2020-07-17 ENCOUNTER — Encounter: Payer: Self-pay | Admitting: Psychiatry

## 2020-07-17 ENCOUNTER — Encounter: Payer: Self-pay | Admitting: Internal Medicine

## 2020-07-17 ENCOUNTER — Telehealth (INDEPENDENT_AMBULATORY_CARE_PROVIDER_SITE_OTHER): Payer: 59 | Admitting: Psychiatry

## 2020-07-17 DIAGNOSIS — F250 Schizoaffective disorder, bipolar type: Secondary | ICD-10-CM | POA: Diagnosis not present

## 2020-07-17 DIAGNOSIS — F411 Generalized anxiety disorder: Secondary | ICD-10-CM | POA: Diagnosis not present

## 2020-07-17 DIAGNOSIS — G251 Drug-induced tremor: Secondary | ICD-10-CM

## 2020-07-17 DIAGNOSIS — F4001 Agoraphobia with panic disorder: Secondary | ICD-10-CM | POA: Diagnosis not present

## 2020-07-17 DIAGNOSIS — T50905D Adverse effect of unspecified drugs, medicaments and biological substances, subsequent encounter: Secondary | ICD-10-CM

## 2020-07-17 DIAGNOSIS — F5105 Insomnia due to other mental disorder: Secondary | ICD-10-CM

## 2020-07-17 DIAGNOSIS — Z0189 Encounter for other specified special examinations: Secondary | ICD-10-CM

## 2020-07-17 DIAGNOSIS — Z9189 Other specified personal risk factors, not elsewhere classified: Secondary | ICD-10-CM

## 2020-07-17 DIAGNOSIS — F1211 Cannabis abuse, in remission: Secondary | ICD-10-CM

## 2020-07-17 DIAGNOSIS — Z79899 Other long term (current) drug therapy: Secondary | ICD-10-CM

## 2020-07-17 MED ORDER — GLUCOSE BLOOD VI STRP: ORAL_STRIP | 1 refills | Status: DC

## 2020-07-17 NOTE — Progress Notes (Signed)
Cody Short [id number] 21-Jun-1976 44 y.o.   Virtual Visit via [place]  I connected with pt by WebEx and verified that I am speaking with the correct person using two identifiers.   I discussed the limitations, risks, security and privacy concerns of performing an evaluation and management service by Jack[facility]ul[facility] and the availability of in person appointments. I also discussed with the patient that there may be a patient responsible charge related to this service. The patient expressed understanding and agreed to proceed.  I discussed the assessment and treatment plan with the patient. The patient was provided an opportunity to ask questions and all were answered. The patient agreed with the plan and demonstrated an understanding of the instructions.   The patient was advised to call back or seek an in-person evaluation if the symptoms worsen or if the condition fails to improve as anticipated.  30 minutes video encounter from 930 to 1000.   Subjective:   Patient ID:  Cody Short is a 44 y.o. (DOB 09/24/1976) male.  Chief Complaint:  Chief Complaint  Patient presents with  . Follow-up  . Manic Behavior  . Medication Problem  . Medication Reaction     Cody Short presents to the office today for follow-up of several psychiatric diagnoses ...    seen August 2020.  For obsessive anxiety  Switched Lexapro 5 mg daily on May 18.  But he stated it made him hungry and so it was stopped in August.  He continued on Saphris 10 mg nightly, lithium 1200 mg daily, propranolol as needed tremor.  Lithium level was ordered.  He called Sept saying he was manic.  Had a lithium level 0.7 and so dose was increased to 1500 mg daily.    Last seen December 21, 2019.  He had some ongoing manic symptoms and Saphris was increased to 15 mg nightly.  02/07/2020 appointment with the following noted: He has made multiple phone calls to the office since that date.  He decided he wanted  to stop lithium because of urinary problems.  He was warned this could lead to mania but he wanted to pursue it anyway.  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  Cody Short had been having bedwetting episodes which she attributed to the lithium.  He has not consistently taken Saphris 15 mg at bedtime since the office visit in February. He completely stopped lithium January 30, 2020 U frequency is much better and less thirsty.  Bedwetting resolved.  Reduced BM to 1-2 times daily.  Gassy.  Glucose is high.  B is hospitalized with unknown disease.  Insulin adjusted. Mental status is best it's ever been.  Less mania.  Less obsessing.  No worsening mania. Is taking Saphris 15 mg HS> Plan no med changes.  04/10/2020 appointment with the following noted: No longer on lithium.  Still frequent urination and thirst DT DM poorly controlled despite meds.  Endo says little else to take bc history of pancreatitis.  Says he goes to urinate q 15 mins. Plan: Wean Saphris in crossover to loxapine 150 mg HS over 2 weeks bc he and endocrinologist believe it's likely Saphris is cause of the inability to get his glucose to acceptable levels.   06/07/20 appt with the following noted: He's called a couple of times CO sleepiness and reduced loxapine to 50 mg HS. Started Vegan diet and BS is better 4 weeks ago.  Thinks glucose is better off the  Saphris. Lost 6 #.   CO sleepiness and sleep 16 hours   He thinks it also drove up pulse and blood pressure. Asks about taking Saphris 10 and a second medication. Mood has been fine.   Plan: He wants to restart Saphris 10 mg HS and use a 2nd mood stabilizer. Start Saphris and reduce Loxapine  To 10 mg at night DT prior failure of 10 Mg Saphris with lithium.  06/27/2020 phone call from patient: Pt called to report Loxapine & Saphris is causing his BP to be very high and Saphris is causing blood sugar to be high. Apt 10/18. Need changes before  then. Call back @ [phone]  MD response: He had wanted to go back on Saphris and reduce loxapine which we did.  Tell him to stop the Saphris and increase loxapine to 20 mg nightly.  He did not tolerate 50 mg of loxapine very well. Nurse TC withpt: Spoke with Cody Short and he will stop the Saphris, but feels the increase in the Loxapine to 20 mg will increase his blood sugars more. Explained that with coming off the [place] he needs to increase it a bit or he will not be stable with his moods. Advised him to stop the Saphris and would check with Dr. [name] about any other recommendations.   07/17/20 appt with the following noted: Doing terrible.  Needs melatonin to sleep.  Claims loxapine is elevating BP, pulse, TG, cholesterol, glucose.  Claimed Saphris did it too.   Sees PCP at end of October.   Started Vegan diet about 6 weeks ago and exercising.    Patient reports stable mood and deniesr irritable moods.  Denies appetite disturbance.  Patient reports that energy and motivation have been good.  Patient denies any difficulty with concentration.  Patient denies any suicidal ideation.  Admits he's easily stressed.  Works with [employer].  Prior psychiatric medications: risperidone with cognitive side effects, Abilify,  Zyprexa, Seroquel 600 mg, perphenazine, Saphris 20 , haloperidol, Geodon, Saphris, Vraylar.   He's prone to EPS. Depakote (Note patient had severe pancreatitis from Depakote.), Equetro, N-acetylcysteine with minimal benefit, , Amantadine with vomiting.  Sertraline and lexapro sexual SE,   Propranolol for tremor, lithium 1500,     Review of Systems:  Review of Systems  Endocrine: Positive for polydipsia and polyuria.  Genitourinary: Negative for decreased urine volume and frequency.  Neurological: Negative for tremors and weakness.  Psychiatric/Behavioral: Positive for decreased concentration. Negative for agitation, behavioral problems, confusion, dysphoric mood,  hallucinations, self-injury, sleep disturbance and suicidal ideas. The patient is nervous/anxious. The patient is not hyperactive.     Medications: I have reviewed the patient's current medications.  Current Outpatient Medications  Medication Sig Dispense Refill  . Blood Glucose Monitoring Suppl (ONETOUCH VERIO FLEX SYSTEM) w/Device KIT Use onetouch verio flex meter to check blood sugar 3 times daily. DX:E11.65 1 kit 0  . fenofibrate (TRICOR) 145 MG tablet Take 1 tablet (145 mg total) by mouth daily. 90 tablet 3  . glucose blood test strip Use OneTouch Verio strips as instructed to check blood sugar 7 times daily. DX:E11.65 700 each 1  . insulin aspart (NOVOLOG FLEXPEN) 100 UNIT/ML FlexPen Inject 25-35 Units into the skin 3 (three) times daily with meals. 60 mL 3  . Insulin Pen Needle (BD PEN NEEDLE NANO U/F) 32G X 4 MM MISC USE 4 TIMES DAILY WITH INSULIN PENS 400 each 3  . LANTUS SOLOSTAR 100 UNIT/ML Solostar Pen Inject 30 Units into the skin  at bedtime. 32.4 mL 3  . loxapine (LOXITANE) 10 MG capsule Take 1 capsule (10 mg total) by mouth at bedtime. (Patient taking differently: Take 20 mg by mouth at bedtime. ) 90 capsule 0  . metFORMIN (GLUCOPHAGE-XR) 500 MG 24 hr tablet Take 4 tablets (2,000 mg total) by mouth daily with supper. 360 tablet 2  . OneTouch Delica Lancets 96V MISC 1 each by Does not apply route 4 (four) times daily. Use Onetouch Delica lancets to check blood sugar 4 times daily. DX:E11.65 400 each 11   No current facility-administered medications for this visit.    Medication Side Effects: Other: less tremor  Allergies:  Allergies  Allergen Reactions  . Divalproex Sodium Other (See Comments)    unknown  . Methylphenidate Hcl     Aggravate bipolar  . Oxycodone Hcl     REACTION: hallucinations  . Ozempic (0.25 Or 0.5 Mg-Dose) [Semaglutide(0.25 Or 0.9m-Dos)] Nausea Only  . Paroxetine     Aggravate bipolar disorder    Past Medical History:  Diagnosis Date  . Acne  varioliformis 07/04/2010  . [place] ([place]   . DM w/o Complication Type II 98/06/3809 . HYPERTRIGLYCERIDEMIA 10/14/2010  . Nocturia 07/04/2010  . PILAR CYST 08/03/2007   Cyst in groin-states comes up when blood sugar gets high. Recurs if cannot walk for a week.     . TOBACCO ABUSE, HX OF 07/31/2009    Family History  Problem Relation Age of Onset  . Diabetes Mother   . Breast cancer Mother        was told not hereditary  . Cirrhosis Mother        fatty liver  . Diabetes Father   . Melanoma Maternal Uncle   . Colon cancer Neg Hx   . Stomach cancer Neg Hx   . Pancreatitis Neg Hx   . Heart disease Neg Hx   . Kidney disease Neg Hx   . Liver disease Neg Hx     Social History   Socioeconomic History  . Marital status: Single    Spouse name: Not on file  . Number of children: Not on file  . Years of education: Not on file  . Highest education level: Not on file  Occupational History  . Not on file  Tobacco Use  . Smoking status: Former Smoker    Packs/day: 2.50    Years: 10.00    Pack years: 25.00    Types: Cigarettes    Quit date: 10/27/2006    Years since quitting: 13.7  . Smokeless tobacco: Never Used  Substance and Sexual Activity  . Alcohol use: No    Alcohol/week: 0.0 standard drinks    Comment: none at all  . Drug use: No  . Sexual activity: Not on file  Other Topics Concern  . Not on file  Social History Narrative   Single. Not dating currently. No kids.    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[employer] for [employer]   Social Determinants of Health   Financial Resource Strain:   . Difficulty of Paying Living Expenses: Not on file  Food Insecurity:   . Worried About RCharity fundraiserin the Last Year: Not on file  . Ran Out of Food in the Last Year: Not on file  Transportation Needs:   . Lack of Transportation (Medical): Not on  file  . Lack of Transportation  (Non-Medical): Not on file  Physical Activity:   . Days of Exercise per Week: Not on file  . Minutes of Exercise per Session: Not on file  Stress:   . Feeling of Stress : Not on file  Social Connections:   . Frequency of Communication with Friends and Family: Not on file  . Frequency of Social Gatherings with Friends and Family: Not on file  . Attends Religious Services: Not on file  . Active Member of Clubs or Organizations: Not on file  . Attends Banker Meetings: Not on file  . Marital Status: Not on file  Intimate Partner Violence:   . Fear of Current or Ex-Partner: Not on file  . Emotionally Abused: Not on file  . Physically Abused: Not on file  . Sexually Abused: Not on file    Past Medical History, Surgical history, Social history, and Family history were reviewed and updated as appropriate.   Please see review of systems for further details on the patient's review from today.   Objective:   Physical Exam:  There were no vitals taken for this visit.  Physical Exam Neurological:     Mental Status: He is alert and oriented to person, place, and time.     Cranial Nerves: No dysarthria.  Psychiatric:        Attention and Perception: Attention normal.        Mood and Affect: Mood is not anxious or depressed.        Speech: Speech normal. Speech is not rapid and pressured.        Behavior: Behavior is not agitated or hyperactive. Behavior is cooperative.        Thought Content: Thought content is not paranoid or delusional. Thought content does not include homicidal or suicidal ideation. Thought content does not include homicidal or suicidal plan.        Cognition and Memory: Cognition and memory normal.     Comments: No paranoia.  No sig anxiety.  No mania Insight and judgment fair to poor. Is reading on the Internet about potential side effects is complicating treatment Chronically self-preoccupied and hyperthymic style     Lab Review:     Component  Value Date/Time   NA 140 06/18/2018 1502   K 4.5 06/18/2018 1502   CL 107 06/18/2018 1502   CO2 19 (L) 06/18/2018 1502   GLUCOSE 129 (H) 06/18/2018 1502   BUN 14 06/18/2018 1502   CREATININE 1.24 06/18/2018 1502   CALCIUM 10.3 06/18/2018 1502   PROT 8.3 (H) 06/18/2018 1502   ALBUMIN 4.4 02/10/2017 1631   AST 26 06/18/2018 1502   ALT 24 06/18/2018 1502   ALKPHOS 54 02/10/2017 1631   BILITOT 0.4 06/18/2018 1502   GFRNONAA 84 04/02/2018 1551   GFRAA 97 04/02/2018 1551       Component Value Date/Time   WBC 11.3 (H) 06/27/2016 1441   RBC 4.52 06/27/2016 1441   HGB 13.5 06/27/2016 1441   HCT 39.0 06/27/2016 1441   PLT 248.0 06/27/2016 1441   MCV 86.3 06/27/2016 1441   MCH 29.6 01/28/2014 1250   MCHC 34.5 06/27/2016 1441   RDW 13.2 06/27/2016 1441   LYMPHSABS 1.9 06/27/2016 1441   MONOABS 0.8 06/27/2016 1441   EOSABS 0.2 06/27/2016 1441   BASOSABS 0.1 06/27/2016 1441    Lithium Lvl  Date Value Ref Range Status  09/09/2019 1.1 0.6 - 1.2 mmol/L Final    12/14/19 lithium  level 0.6 (trough 13 hours) , normal BMP and calcium   No results found for: PHENYTOIN, PHENOBARB, VALPROATE, CBMZ   .res Assessment: Plan:    [place] was seen today for follow-up, manic behavior, medication problem and medication reaction.  Diagnoses and all orders for this visit:  Schizoaffective disorder, bipolar type ([place]) -     Glucose, fasting -     Lipid panel  Generalized anxiety disorder  Panic disorder with agoraphobia  Insomnia due to mental condition  Marijuana abuse in remission  Lithium-induced tremor  Medication side effect, subsequent encounter -     Glucose, fasting -     Lipid panel  Assessment of effects of psychotropic drug in patient at risk for metabolic syndrome -     Glucose, fasting -     Lipid panel   Cody Short has had significant disruptive mood and psychotic symptoms and substance abuse over the course of his treatment here.  It has been evident in chronic  occupational dysfunction and some relational problems.  He is also been very prone to EPS making it difficult to get adequate mood stabilization.   Has a history of severe pancreatitis from Depakote.  He did not have an adequate response to carbamazepine and lithium was insufficient to control his symptoms and monotherapy.  Lithium was stopped early April 2021 bc of diabetes insipidus.  Disc risk mania occurring is still a concern because he is only been off the lithium for short time frame.  Patient is again convinced that loxapine is elevating his blood sugar, cholesterol, pulse, and blood pressure.  He was equally convinced that Saphris was elevating his blood sugar.  He reads about these potential side effects on the Internet.  There are no available mood stabilizers that do not have a warning of elevating blood sugar and cholesterol.  He has tried all the mood stabilizers that do not have this warning.  All of the available mood stabilizers left that have any effectiveness are antipsychotics and the FDA has required a class warning on all antipsychotics about blood sugar and cholesterol effects.  This is true despite the fact that not all antipsychotics are equal and increasing these risks.  The patient's reading of these potential side effects is complicating finding an effective treatment.  The only way to prove whether or not these meds are elevating his blood sugar and cholesterol is to stop the medications for a period of time.  This exposes him to manic risk but there is no chance of finding an adequate mood stabilizer that will satisfy him until we can prove whether or not these meds are in fact affecting his blood sugar, cholesterol, blood pressure, and pulse. Therefore he will stop the loxapine which is at a low dose. We will check labs in 3 weeks.  We will attempt follow-up in 4 weeks.  If he gets manic he will let us know.  In general he has not been able to afford branded medications which  further limits options such as the use of Latuda which has a lower metabolic risk than other antipsychotics.  He is also EPS prone which further limits options.  Discussed potential metabolic side effects associated with atypical antipsychotics, as well as potential risk for movement side effects. Advised pt to contact office if movement side effects occur.   He has a low stress tolerance.  He can be easily agitated in public.  He has continued abstinence from marijuana is encouraged.  He has a history of  cannabinoid hyperemesis which finally convinced him to discontinue the marijuana.  His mood disorder and thought disorganization have improved since being off the marijuana.  He has a very long history of heavy marijuana dependence.  Keep monitoring glucose.  Follow-up 4 weeks.  Pt has some chronic instability.  Call if gets manic.  He understands there is a risk of hospitalization with mania.  Lynder Parents, MD, DFAPA  Please see After Visit Summary for patient specific instructions.  Future Appointments  Date Time Provider [place]  08/13/2020  3:00 PM Cottle, Billey Co., MD [facility] None  09/14/2020  3:20 PM Philemon Kingdom, MD [facility] None    Orders Placed This Encounter  Procedures  . Glucose, fasting  . Lipid panel      -------------------------------

## 2020-07-23 ENCOUNTER — Telehealth: Payer: Self-pay

## 2020-07-23 NOTE — Telephone Encounter (Signed)
PA for increase testing strips has been denied by insurance.

## 2020-07-30 ENCOUNTER — Other Ambulatory Visit: Payer: Self-pay | Admitting: Psychiatry

## 2020-07-30 DIAGNOSIS — F250 Schizoaffective disorder, bipolar type: Secondary | ICD-10-CM

## 2020-07-30 DIAGNOSIS — F4001 Agoraphobia with panic disorder: Secondary | ICD-10-CM

## 2020-07-30 DIAGNOSIS — F5105 Insomnia due to other mental disorder: Secondary | ICD-10-CM

## 2020-07-30 DIAGNOSIS — F411 Generalized anxiety disorder: Secondary | ICD-10-CM

## 2020-07-30 NOTE — Telephone Encounter (Signed)
review 

## 2020-08-07 LAB — LIPID PANEL
Chol/HDL Ratio: 4.6 ratio (ref 0.0–5.0)
Cholesterol, Total: 187 mg/dL (ref 100–199)
HDL: 41 mg/dL (ref >39)
LDL Chol Calc (NIH): 110 mg/dL — ABNORMAL HIGH (ref 0–99)
Triglycerides: 208 mg/dL — ABNORMAL HIGH (ref 0–149)
VLDL Cholesterol Cal: 36 mg/dL (ref 5–40)

## 2020-08-07 LAB — GLUCOSE, FASTING: Glucose, Plasma: 126 mg/dL — ABNORMAL HIGH (ref 65–99)

## 2020-08-13 ENCOUNTER — Telehealth (INDEPENDENT_AMBULATORY_CARE_PROVIDER_SITE_OTHER): Payer: 59 | Admitting: Psychiatry

## 2020-08-13 ENCOUNTER — Telehealth: Payer: 59 | Admitting: Psychiatry

## 2020-08-13 ENCOUNTER — Encounter: Payer: Self-pay | Admitting: Psychiatry

## 2020-08-13 DIAGNOSIS — F4001 Agoraphobia with panic disorder: Secondary | ICD-10-CM | POA: Diagnosis not present

## 2020-08-13 DIAGNOSIS — F5105 Insomnia due to other mental disorder: Secondary | ICD-10-CM | POA: Diagnosis not present

## 2020-08-13 DIAGNOSIS — F411 Generalized anxiety disorder: Secondary | ICD-10-CM

## 2020-08-13 DIAGNOSIS — F250 Schizoaffective disorder, bipolar type: Secondary | ICD-10-CM

## 2020-08-13 DIAGNOSIS — F1211 Cannabis abuse, in remission: Secondary | ICD-10-CM

## 2020-08-13 NOTE — Progress Notes (Signed)
Cody Short [id number] Jan 07, 1976 44 y.o.   Virtual Visit via [place]  I connected with pt by WebEx and verified that I am speaking with the correct person using two identifiers.   I discussed the limitations, risks, security and privacy concerns of performing an evaluation and management service by Cody Short and the availability of in person appointments. I also discussed with the patient that there may be a patient responsible charge related to this service. The patient expressed understanding and agreed to proceed.  I discussed the assessment and treatment plan with the patient. The patient was provided an opportunity to ask questions and all were answered. The patient agreed with the plan and demonstrated an understanding of the instructions.   The patient was advised to call back or seek an in-person evaluation if the symptoms worsen or if the condition fails to improve as anticipated.  30 minutes video encounter from 300 until 330.   Subjective:   Patient ID:  Cody Short is a 44 y.o. (DOB 05-23-1976) male.  Chief Complaint:  Chief Complaint  Patient presents with  . Follow-up    med change  . Manic Behavior  . Medication Problem     Cody Short presents to the office today for follow-up of several psychiatric diagnoses ...    seen August 2020.  For obsessive anxiety  Switched Lexapro 5 mg daily on May 18.  But he stated it made him hungry and so it was stopped in August.  He continued on Saphris 10 mg nightly, lithium 1200 mg daily, propranolol as needed tremor.  Lithium level was ordered.  He called Sept saying he was manic.  Had a lithium level 0.7 and so dose was increased to 1500 mg daily.    Last seen December 21, 2019.  He had some ongoing manic symptoms and Saphris was increased to 15 mg nightly.  02/07/2020 appointment with the following noted: He has made multiple phone calls to the office since that date.  He decided he wanted to stop  lithium because of urinary problems.  He was warned this could lead to mania but he wanted to pursue it anyway.  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  Cody Short had been having bedwetting episodes which she attributed to the lithium.  He has not consistently taken Saphris 15 mg at bedtime since the office visit in February. He completely stopped lithium January 30, 2020 U frequency is much better and less thirsty.  Bedwetting resolved.  Reduced BM to 1-2 times daily.  Gassy.  Glucose is high.  B is hospitalized with unknown disease.  Insulin adjusted. Mental status is best it's ever been.  Less mania.  Less obsessing.  No worsening mania. Is taking Saphris 15 mg HS> Plan no med changes.  04/10/2020 appointment with the following noted: No longer on lithium.  Still frequent urination and thirst DT DM poorly controlled despite meds.  Endo says little else to take bc history of pancreatitis.  Says he goes to urinate q 15 mins. Plan: Wean Saphris in crossover to loxapine 150 mg HS over 2 weeks bc he and endocrinologist believe it's likely Saphris is cause of the inability to get his glucose to acceptable levels.   06/07/20 appt with the following noted: He's called a couple of times CO sleepiness and reduced loxapine to 50 mg HS. Started Vegan diet and BS is better 4 weeks ago.  Thinks glucose is better off  the Saphris. Lost 6 #.   CO sleepiness and sleep 16 hours   He thinks it also drove up pulse and blood pressure. Asks about taking Saphris 10 and a second medication. Mood has been fine.   Plan: He wants to restart Saphris 10 mg HS and use a 2nd mood stabilizer. Start Saphris and reduce Loxapine  To 10 mg at night DT prior failure of 10 Mg Saphris with lithium.  06/27/2020 phone call from patient: Pt called to report Loxapine & Saphris is causing his BP to be very high and Saphris is causing blood sugar to be high. Apt 10/18. Need changes before then.  Call back @ [phone]  MD response: He had wanted to go back on Saphris and reduce loxapine which we did.  Tell him to stop the Saphris and increase loxapine to 20 mg nightly.  He did not tolerate 50 mg of loxapine very well. Nurse TC withpt: Spoke with Cody Short and he will stop the Saphris, but feels the increase in the Loxapine to 20 mg will increase his blood sugars more. Explained that with coming off the Cody Short he needs to increase it a bit or he will not be stable with his moods. Advised him to stop the Saphris and would check with Cody Short about any other recommendations.   07/17/20 appt with the following noted: Doing terrible.  Needs melatonin to sleep.  Claims loxapine is elevating BP, pulse, TG, cholesterol, glucose.  Claimed Saphris did it too.   Sees PCP at end of October.   Started Vegan diet about 6 weeks ago and exercising.   A/P: There are no available mood stabilizers that do not have a warning of elevating blood sugar and cholesterol.  He has tried all the mood stabilizers that do not have this warning.  All of the available mood stabilizers left that have any effectiveness are antipsychotics and the FDA has required a class warning on all antipsychotics about blood sugar and cholesterol effects.  This is true despite the fact that not all antipsychotics are equal at increasing these risks.  The patient's reading of these potential side effects is complicating finding an effective treatment.  The only way to prove whether or not these meds are elevating his blood sugar and cholesterol is to stop the medications for a period of time.  This exposes him to manic risk but there is no chance of finding an adequate mood stabilizer that will satisfy him until we can prove whether or not these meds are in fact affecting his blood sugar, cholesterol, blood pressure, and pulse. Therefore he will stop the loxapine which is at a low dose. We will check labs in 3 weeks.  We will attempt follow-up in  4 weeks.  If he gets manic he will let us know.   08/13/2020 appointment with the following noted: Blood sugar is pretty much the same off the loxapine but BP and pulse improved from 110 to 70 range.  But had started BP med about mid September. Started counselor Sonia [facility] at [facility] and started nutritionsist.  Has changed and restricted diet. Sees PCP 07/24/20.  Endo is managing DM.   Mood has been really good.  Counseling helped the anxiety.  Getting 10 hours of sleep.  PCP suggested changing time going to bed earlier and it's helping.   Sometimes has racing thoughts and gets comments from others while playing video games that he needs to take a break. Patient reports stable mood  and deniesr irritable moods.  Denies appetite disturbance.  Patient reports that energy and motivation have been good.  Patient denies any difficulty with concentration.  Patient denies any suicidal ideation.  Admits he's easily stressed.  Works with [employer].  Prior psychiatric medications: risperidone with cognitive side effects, Abilify,  Zyprexa, Seroquel 600 mg, perphenazine, Saphris 20 , haloperidol, Geodon, Saphris, Vraylar, loxapine. He's prone to EPS. Depakote (Note patient had severe pancreatitis from Depakote.), Equetro, N-acetylcysteine with minimal benefit, , Amantadine with vomiting.  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    Review of Systems:  Review of Systems  Endocrine: Positive for polydipsia and polyuria.  Genitourinary: Negative for decreased urine volume and frequency.  Neurological: Negative for tremors and weakness.  Psychiatric/Behavioral: Positive for decreased concentration. Negative for agitation, behavioral problems, confusion, dysphoric mood, hallucinations, self-injury, sleep disturbance and suicidal ideas. The patient is nervous/anxious. The patient is  not hyperactive.     Medications: I have reviewed the patient's current medications.  Current Outpatient Medications  Medication Sig Dispense Refill  . Blood Glucose Monitoring Suppl (ONETOUCH VERIO FLEX SYSTEM) w/Device KIT Use onetouch verio flex meter to check blood sugar 3 times daily. DX:E11.65 1 kit 0  . fenofibrate (TRICOR) 145 MG tablet Take 1 tablet (145 mg total) by mouth daily. 90 tablet 3  . glucose blood test strip Use OneTouch Verio strips as instructed to check blood sugar 7 times daily. DX:E11.65 700 each 1  . insulin aspart (NOVOLOG FLEXPEN) 100 UNIT/ML FlexPen Inject 25-35 Units into the skin 3 (three) times daily with meals. 60 mL 3  . Insulin Pen Needle (BD PEN NEEDLE NANO U/F) 32G X 4 MM MISC USE 4 TIMES DAILY WITH INSULIN PENS 400 each 3  . LANTUS SOLOSTAR 100 UNIT/ML Solostar Pen Inject 30 Units into the skin at bedtime. 32.4 mL 3  . metFORMIN (GLUCOPHAGE-XR) 500 MG 24 hr tablet Take 4 tablets (2,000 mg total) by mouth daily with supper. 360 tablet 2  . OneTouch Delica Lancets 56E MISC 1 each by Does not apply route 4 (four) times daily. Use Onetouch Delica lancets to check blood sugar 4 times daily. DX:E11.65 400 each 11  . valsartan (DIOVAN) 320 MG tablet Take 320 mg by mouth daily.    Marland Kitchen loxapine (LOXITANE) 10 MG capsule Take 1 capsule (10 mg total) by mouth at bedtime. (Patient not taking: Reported on 08/13/2020) 90 capsule 0  . loxapine (LOXITANE) 50 MG capsule TAKE 2 CAPS AT NIGHT (Patient not taking: Reported on 08/13/2020) 180 capsule 0   No current facility-administered medications for this visit.    Medication Side Effects: Other: less tremor  Allergies:  Allergies  Allergen Reactions  . Divalproex Sodium Other (See Comments)    unknown  . Methylphenidate Hcl     Aggravate bipolar  . Oxycodone Hcl     REACTION: hallucinations  . Ozempic (0.25 Or 0.5 Mg-Dose) [Semaglutide(0.25 Or 0.12m-Dos)] Nausea Only  . Paroxetine     Aggravate bipolar disorder     Past Medical History:  Diagnosis Date  . 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 Bipolar affective disorder ([place]   . DM w/o Complication Type II 93/12/2949 . HYPERTRIGLYCERIDEMIA 10/14/2010  . Nocturia 07/04/2010  . PILAR CYST 08/03/2007   Cyst in groin-states comes up when blood sugar gets high. Recurs if cannot walk for a week.     . TOBACCO ABUSE, HX  OF 07/31/2009    Family History  Problem Relation Age of Onset  . Diabetes Mother   . Breast cancer Mother        was told not hereditary  . Cirrhosis Mother        fatty liver  . Diabetes Father   . Melanoma Maternal Uncle   . Colon cancer Neg Hx   . Stomach cancer Neg Hx   . Pancreatitis Neg Hx   . Heart disease Neg Hx   . Kidney disease Neg Hx   . Liver disease Neg Hx     Social History   Socioeconomic History  . Marital status: Single    Spouse name: Not on file  . Number of children: Not on file  . Years of education: Not on file  . Highest education level: Not on file  Occupational History  . Not on file  Tobacco Use  . Smoking status: Former Smoker    Packs/day: 2.50    Years: 10.00    Pack years: 25.00    Types: Cigarettes    Quit date: 10/27/2006    Years since quitting: 13.8  . Smokeless tobacco: Never Used  Substance and Sexual Activity  . Alcohol use: No    Alcohol/week: 0.0 standard drinks    Comment: none at all  . Drug use: No  . Sexual activity: Not on file  Other Topics Concern  . Not on file  Social History Narrative   Single. Not dating currently. No kids.    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[employer] 76, for [place]: video games [employer]   Social Determinants of Health   Financial Resource Strain:   . Difficulty of Paying Living Expenses: Not on file  Food Insecurity:   . Worried About Charity fundraiser in the Last Year: Not on file  . Ran Out of Food in the Last Year: Not on file  Transportation Needs:   . Lack of  Transportation (Medical): Not on file  . Lack of Transportation (Non-Medical): Not on file  Physical Activity:   . Days of Exercise per Week: Not on file  . Minutes of Exercise per Session: Not on file  Stress:   . Feeling of Stress : Not on file  Social Connections:   . Frequency of Communication with Friends and Family: Not on file  . Frequency of Social Gatherings with Friends and Family: Not on file  . Attends Religious Services: Not on file  . Active Member of Clubs or Organizations: Not on file  . Attends Archivist Meetings: Not on file  . Marital Status: Not on file  Intimate Partner Violence:   . Fear of Current or Ex-Partner: Not on file  . Emotionally Abused: Not on file  . Physically Abused: Not on file  . Sexually Abused: Not on file    Past Medical History, Surgical history, Social history, and Family history were reviewed and updated as appropriate.   Please see review of systems for further details on the patient's review from today.   Objective:   Physical Exam:  There were no vitals taken for this visit.  Physical Exam Neurological:     Mental Status: He is alert and oriented to person, place, and time.     Cranial Nerves: No dysarthria.  Psychiatric:        Attention and Perception: Attention  normal.        Mood and Affect: Mood is anxious. Mood is not depressed.        Speech: Speech normal. Speech is not rapid and pressured or slurred.        Behavior: Behavior is not agitated or hyperactive. Behavior is cooperative.        Thought Content: Thought content is not paranoid or delusional. Thought content does not include homicidal or suicidal ideation. Thought content does not include homicidal or suicidal plan.        Cognition and Memory: Cognition and memory normal.     Comments: No paranoia.  No sig anxiety.  No mania Insight and judgment fair to poor. Is reading on the Internet about potential side effects is complicating  treatment Chronically self-preoccupied and hyperthymic style     Lab Review:     Component Value Date/Time   NA 140 06/18/2018 1502   K 4.5 06/18/2018 1502   CL 107 06/18/2018 1502   CO2 19 (L) 06/18/2018 1502   GLUCOSE 129 (H) 06/18/2018 1502   BUN 14 06/18/2018 1502   CREATININE 1.24 06/18/2018 1502   CALCIUM 10.3 06/18/2018 1502   PROT 8.3 (H) 06/18/2018 1502   ALBUMIN 4.4 02/10/2017 1631   AST 26 06/18/2018 1502   ALT 24 06/18/2018 1502   ALKPHOS 54 02/10/2017 1631   BILITOT 0.4 06/18/2018 1502   GFRNONAA 84 04/02/2018 1551   GFRAA 97 04/02/2018 1551       Component Value Date/Time   WBC 11.3 (H) 06/27/2016 1441   RBC 4.52 06/27/2016 1441   HGB 13.5 06/27/2016 1441   HCT 39.0 06/27/2016 1441   PLT 248.0 06/27/2016 1441   MCV 86.3 06/27/2016 1441   MCH 29.6 01/28/2014 1250   MCHC 34.5 06/27/2016 1441   RDW 13.2 06/27/2016 1441   LYMPHSABS 1.9 06/27/2016 1441   MONOABS 0.8 06/27/2016 1441   EOSABS 0.2 06/27/2016 1441   BASOSABS 0.1 06/27/2016 1441    Lithium Lvl  Date Value Ref Range Status  09/09/2019 1.1 0.6 - 1.2 mmol/L Final    12/14/19 lithium level 0.6 (trough 13 hours) , normal BMP and calcium   No results found for: PHENYTOIN, PHENOBARB, VALPROATE, CBMZ   .res Assessment: Plan:    Hamp was seen today for follow-up, manic behavior and medication problem.  Diagnoses and all orders for this visit:  Schizoaffective disorder, bipolar type ([place])  Generalized anxiety disorder  Panic disorder with agoraphobia  Insomnia due to mental condition  Marijuana abuse in remission   Cody Short has had significant disruptive mood and psychotic symptoms and substance abuse over the course of his treatment here.  It has been evident in chronic occupational dysfunction and some relational problems.  He is also been very prone to EPS making it difficult to get adequate mood stabilization.   Has a history of severe pancreatitis from Depakote.  He did not have an  adequate response to carbamazepine and lithium was insufficient to control his symptoms and monotherapy.  Patient was again convinced that loxapine is elevating his blood sugar, cholesterol, pulse, and blood pressure.  He was equally convinced that Saphris was elevating his blood sugar.  He reads about these potential side effects on the Internet. He is no longer conviced loxapine affect metabolics but not sure.  There are no available mood stabilizers that do not have a warning of elevating blood sugar and cholesterol.  He has tried all the mood stabilizers that do not have  this warning.  All of the available mood stabilizers left that have any effectiveness are antipsychotics and the FDA has required a class warning on all antipsychotics about blood sugar and cholesterol effects.  This is true despite the fact that not all antipsychotics are equal and increasing these risks.  The patient's reading of these potential side effects is complicating finding an effective treatment.  The only way to prove whether or not these meds are elevating his blood sugar and cholesterol is to stop the medications for a period of time.  This exposes him to manic risk but there is no chance of finding an adequate mood stabilizer that will satisfy him until we can prove whether or not these meds are in fact affecting his blood sugar, cholesterol, blood pressure, and pulse.  So far mood is not more manic off loxapine for 3 weeks.  Will wait to start anything until PCP reviews his labs and vital signs on 08/23/20.    In general he has not been able to afford branded medications which further limits options such as the use of Latuda which has a lower metabolic risk than other antipsychotics.  He is also EPS prone which further limits options. Consider Caplyta bc low EPS and metabolics. Fanapt bc of his fear of elevated BP.   Rec not reading on Internet bc info is unreliable. Better chance of Caplyta bc only one dose is  available so compliance likely to be better. Give samples of Caplyta 42 mg once daily.  Discussed potential metabolic side effects associated with atypical antipsychotics, as well as potential risk for movement side effects. Advised pt to contact office if movement side effects occur.   He has a low stress tolerance.  He can be easily agitated in public.  He has continued abstinence from marijuana is encouraged.  He has a history of cannabinoid hyperemesis which finally convinced him to discontinue the marijuana.  His mood disorder and thought disorganization have improved since being off the marijuana.  He has a very long history of heavy marijuana dependence.  Keep monitoring glucose.  Follow-up 6 weeks.  Pt has some chronic instability.  Call if gets manic.  He understands there is a risk of hospitalization with mania.  Lynder Parents, MD, DFAPA  Please see After Visit Summary for patient specific instructions.  Future Appointments  Date Time Provider [place]  09/14/2020  3:20 PM Philemon Kingdom, MD [id number] None    No orders of the defined types were placed in this encounter.     -------------------------------

## 2020-08-30 ENCOUNTER — Other Ambulatory Visit: Payer: Self-pay | Admitting: Psychiatry

## 2020-08-30 DIAGNOSIS — F5105 Insomnia due to other mental disorder: Secondary | ICD-10-CM

## 2020-08-30 DIAGNOSIS — F4001 Agoraphobia with panic disorder: Secondary | ICD-10-CM

## 2020-08-30 DIAGNOSIS — F411 Generalized anxiety disorder: Secondary | ICD-10-CM

## 2020-08-30 DIAGNOSIS — F250 Schizoaffective disorder, bipolar type: Secondary | ICD-10-CM

## 2020-08-30 MED ORDER — CAPLYTA 42 MG PO CAPS
42.0000 mg | ORAL_CAPSULE | Freq: Every day | ORAL | 2 refills | Status: DC
Start: 2020-08-30 — End: 2020-12-20

## 2020-08-30 NOTE — Telephone Encounter (Signed)
[name] called to report that the Caplyta is working great.  He is starting the 2nd sample pack and was told to call to have [place] do the PA for this medication so insurance will cover it and he can continue on this medicine.  He has 13 days left of samples.  Let him know if the insurance does approve the medicine so he will know what to do.

## 2020-09-10 ENCOUNTER — Telehealth: Payer: Self-pay | Admitting: Psychiatry

## 2020-09-10 NOTE — Telephone Encounter (Signed)
Pt was calling to see if you had heard back from insurance about the caplyta ? He also said that it looks like after the first month using the savings card it will cost him $ 800. He would like you to call him back at [phone]

## 2020-09-11 NOTE — Telephone Encounter (Signed)
Patient has 2 insurances per pharmacy, [facility] and [employer]. [employer] indicates it's not on formulary per Pharmacist and [employer] shows $1400.00. patient has not brought in the co-pay card yet he was given so unable to run it with that.  Nurse will submit a PA through [facility] to see if that helps with cost as well.

## 2020-09-14 ENCOUNTER — Encounter: Payer: Self-pay | Admitting: Internal Medicine

## 2020-09-14 ENCOUNTER — Other Ambulatory Visit: Payer: Self-pay

## 2020-09-14 ENCOUNTER — Ambulatory Visit: Payer: 59 | Admitting: Internal Medicine

## 2020-09-14 VITALS — BP 110/80 | HR 94 | Ht 69.0 in | Wt 213.4 lb

## 2020-09-14 DIAGNOSIS — E669 Obesity, unspecified: Secondary | ICD-10-CM | POA: Diagnosis not present

## 2020-09-14 DIAGNOSIS — E782 Mixed hyperlipidemia: Secondary | ICD-10-CM

## 2020-09-14 DIAGNOSIS — E1165 Type 2 diabetes mellitus with hyperglycemia: Secondary | ICD-10-CM

## 2020-09-14 DIAGNOSIS — R7989 Other specified abnormal findings of blood chemistry: Secondary | ICD-10-CM

## 2020-09-14 LAB — TSH: TSH: 0.02 u[IU]/mL — ABNORMAL LOW (ref 0.35–4.50)

## 2020-09-14 LAB — POCT GLYCOSYLATED HEMOGLOBIN (HGB A1C): Hemoglobin A1C: 6.9 % — AB (ref 4.0–5.6)

## 2020-09-14 LAB — T3, FREE: T3, Free: 3.9 pg/mL (ref 2.3–4.2)

## 2020-09-14 LAB — T4, FREE: Free T4: 1.41 ng/dL (ref 0.60–1.60)

## 2020-09-14 NOTE — Patient Instructions (Addendum)
Please continue: - Metformin ER 2000 mg with dinner - Lantus 15 units at bedtime - Novolog before meals: 0-15-15 units befote B-L-D  Please stop at the lab.  Please return in 3 to 4 months with your sugar log.

## 2020-09-14 NOTE — Progress Notes (Signed)
Patient ID: Cody Short, male   DOB: 20-Oct-1976, 44 y.o.   MRN: [id number]  This visit occurred during the SARS-CoV-2 public health emergency.  Safety protocols were in place, including screening questions prior to the visit, additional usage of staff PPE, and extensive cleaning of exam room while observing appropriate contact time as indicated for disinfecting solutions.   HPI: Cody Short is a 44 y.o.-year-old male, presenting for follow-up for DM2, dx in ~2010, insulin-dependent, uncontrolled, without long term complications. Last visit 4 months ago.  In the past he had frequent urination resolved after stopping lithium.  While transitioning off lithium, his Saphris dose was increased the blood sugars started to increase afterwards.  At last visit he was already off Saphris and started on Loxitane.  This was making him sleepier, but was not increasing his blood sugars.  In fact, sugars started to improve and he was able to reduce his insulin doses. However, he is now off Loxitane 2/2 affecting BP and pulse.  Now on Caplyta >> sugars improved significantly on this >> however, he is not sure if he can continue 2/2 insurance coverage.  Before last visit he started to avoid fatty foods and sweets.  He continues to see nutrition.  He switched to an Atkins diet but with more fruits and vegetables.  Also since last visit he changed his circadian rhythm -now sleeps during the night.  He exercises less consistently now, but  stays very active.  At his most recent visit with PCP, a TSH returned suppressed.  Reviewed HbA1c levels: Lab Results  Component Value Date   HGBA1C 8.6 (A) 05/10/2020   HGBA1C 7.9 (A) 02/14/2020   HGBA1C 7.7 (A) 06/14/2019  12/22/2018: HbA1c 7.2%  Pt is on a regimen of: - Metformin ER 2000 mg at bedtime - Novolog before meals: 20-25 units 2-3x a day >> ... >> 0-15-15 units - Lantus 10-15 (25) >> 36 >> 15-25 >> 15 >> 25 >> 15 units at bedtime He could  not tolerate Ozempic >> nausea, AP We had to stop Jardiance due to increased urination. He was previously on Lantus but stopped since sugars improved. He tried Glipizide >> hypoglycemia in the 40s repeatedly. Lowest: 25. Also, Glipizide 2.5 mg in am >> nausea, vomiting, constipation We tried Invokana >> bothersome urination (when he was working) >> had to stop. He had pancreatitis 2/2 Depakote and HTG in the past. He stopped Actos b/c stomach pain.  We stopped Cycloset >> inefficient, could not use b/c price.  Pt checks his sugars 4 times a day: - 5-6 pm:  106-148, 160-202 w/o Lantus >> n/c >> 98-247 >> 114-175 >> 94-149, 192 - 2h after b'fast:  96-200 >> 76-224  >> n/c >> 247 >> n/c >> 100-166, 244 - lunch:  102-157, 190 >> 130-160, 223 >> n/c >> 219 >> 116, 166 - 2h after lunch: 72-220 (ave: 120-150) >> 176-314, 399 >> 99-327 >> 71-132, 171 - before dinner:   92-218, 306 >> n/c >> 230, 387 >> 149-259 >> 102-188 - 2h after dinner: 108-242 (ave 150-160) >> 160-326 >> 133--269 >> 87-134 - bedtime:  84, 122-242, 273 >> 115-182 >> 131-220 >> 119-207 >> 80-160, 190 - nighttime:1 111-270, 309 >> 89-181 >> see above >> 134 >> 83, 95 Lowest sugar was 50s >> 75 >> 71; he has hypoglycemia awareness in the 70s. Highest sugar was 399 >> 468 >> 244.  Glucometer: FreeStyle  No history of CKD, last BUN/creatinine:  08/06/2020: 12/1.11 12/14/2019:  8/1.09, GFR 84, glucose 102 12/22/2018: 15/1.13, GFR 81, glucose 110 Lab Results  Component Value Date   BUN 14 06/18/2018   CREATININE 1.24 06/18/2018   He had MAU in the past, improved at last check: Lab Results  Component Value Date   MICRALBCREAT 35.2 (H) 05/10/2020   MICRALBCREAT 26.7 04/02/2018   MICRALBCREAT 3.7 02/10/2017   MICRALBCREAT 27.3 12/17/2015   MICRALBCREAT 4.0 02/22/2015  12/22/2018: 262 On Diovan.  He has mixed hyperlipidemia: 08/06/2020: 187/208/41/110 05/03/2020: 225/345/38/126 11/23/2019: 109/148/34/49 12/22/2018:  220/359/41/145 Lab Results  Component Value Date   CHOL 187 08/06/2020   HDL 41 08/06/2020   LDLCALC 110 (H) 08/06/2020   LDLDIRECT 120.0 04/02/2018   TRIG 208 (H) 08/06/2020   CHOLHDL 4.6 08/06/2020  On fenofibrate 145.  - last eye exam was in 03/2017: No DR  -+ Numbness but no tingling in his feet  In 2010, he developed pancreatitis from Depakote.  + rash  - scrotal -improved on Nystatin + Triamcinolone.  He may need refills in the future. He has recurrent groin furunculosis.  TSH levels were reviewed  - TSH was suppressed 1 month ago: 08/06/2020: TSH 0.079 05/03/2020: TSH 0.528 12/22/2018: 3.648  He continues to exercise by walking and running on the treadmill and lifting weights.  ROS: Constitutional: no weight gain/+ weight loss, no fatigue, no subjective hyperthermia, no subjective hypothermia Eyes: no blurry vision, no xerophthalmia ENT: no sore throat, no nodules palpated in neck, no dysphagia, no odynophagia, no hoarseness Cardiovascular: no CP/no SOB/no palpitations/no leg swelling Respiratory: no cough/no SOB/no wheezing Gastrointestinal: no N/no V/no D/no C/no acid reflux Musculoskeletal: no muscle aches/no joint aches Skin: no rashes, no hair loss Neurological: no tremors/no numbness/no tingling/no dizziness  I reviewed pt's medications, allergies, PMH, social hx, family hx, and changes were documented in the history of present illness. Otherwise, unchanged from my initial visit note.  Past Medical History:  Diagnosis Date  . 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 Bipolar affective disorder ([place])   . DM w/o Complication Type II 01/30/2702  . HYPERTRIGLYCERIDEMIA 10/14/2010  . Nocturia 07/04/2010  . PILAR CYST 08/03/2007   Cyst in groin-states comes up when blood sugar gets high. Recurs if cannot walk for a week.     . TOBACCO ABUSE, HX OF 07/31/2009   Past Surgical History:  Procedure Laterality Date  . pilar cystectomy     History   Social History  . Marital  Status: Single    Spouse Name: N/A  . Number of Children: 0   Occupational History  .    Social History Main Topics  . Smoking status: Former Smoker - quit in 2010  . Smokeless tobacco: Never Used  . Alcohol Use: No     Comment: none at all  . Drug Use: No   Current Outpatient Medications  Medication Sig Dispense Refill  . Blood Glucose Monitoring Suppl (ONETOUCH VERIO FLEX SYSTEM) w/Device KIT Use onetouch verio flex meter to check blood sugar 3 times daily. DX:E11.65 1 kit 0  . fenofibrate (TRICOR) 145 MG tablet Take 1 tablet (145 mg total) by mouth daily. 90 tablet 3  . glucose blood test strip Use OneTouch Verio strips as instructed to check blood sugar 7 times daily. DX:E11.65 700 each 1  . insulin aspart (NOVOLOG FLEXPEN) 100 UNIT/ML FlexPen Inject 25-35 Units into the skin 3 (three) times daily with meals. 60 mL 3  . Insulin Pen Needle (BD PEN NEEDLE NANO U/F) 32G X 4 MM MISC USE 4 TIMES  DAILY WITH INSULIN PENS 400 each 3  . LANTUS SOLOSTAR 100 UNIT/ML Solostar Pen Inject 30 Units into the skin at bedtime. 32.4 mL 3  . loxapine (LOXITANE) 10 MG capsule Take 1 capsule (10 mg total) by mouth at bedtime. (Patient not taking: Reported on 08/13/2020) 90 capsule 0  . loxapine (LOXITANE) 50 MG capsule TAKE 2 CAPS AT NIGHT (Patient not taking: Reported on 08/13/2020) 180 capsule 0  . Lumateperone Tosylate (CAPLYTA) 42 MG CAPS Take 42 mg by mouth daily. 30 capsule 2  . metFORMIN (GLUCOPHAGE-XR) 500 MG 24 hr tablet Take 4 tablets (2,000 mg total) by mouth daily with supper. 360 tablet 2  . OneTouch Delica Lancets 32N MISC 1 each by Does not apply route 4 (four) times daily. Use Onetouch Delica lancets to check blood sugar 4 times daily. DX:E11.65 400 each 11  . valsartan (DIOVAN) 320 MG tablet Take 320 mg by mouth daily.     No current facility-administered medications for this visit.    No current facility-administered medications on file prior to visit.    Allergies  Allergen  Reactions  . Divalproex Sodium Other (See Comments)    unknown  . Methylphenidate Hcl     Aggravate bipolar  . Oxycodone Hcl     REACTION: hallucinations  . Ozempic (0.25 Or 0.5 Mg-Dose) [Semaglutide(0.25 Or 0.49m-Dos)] Nausea Only  . Paroxetine     Aggravate bipolar disorder   Family History  Problem Relation Age of Onset  . Diabetes Mother   . Breast cancer Mother        was told not hereditary  . Cirrhosis Mother        fatty liver  . Diabetes Father   . Melanoma Maternal Uncle   . Colon cancer Neg Hx   . Stomach cancer Neg Hx   . Pancreatitis Neg Hx   . Heart disease Neg Hx   . Kidney disease Neg Hx   . Liver disease Neg Hx    PE: BP 110/80   Pulse 94   Ht 5' 9"  (1.753 m)   Wt 213 lb 6.4 oz (96.8 kg)   SpO2 96%   BMI 31.51 kg/m  Body mass index is 31.51 kg/m. Wt Readings from Last 3 Encounters:  09/14/20 213 lb 6.4 oz (96.8 kg)  05/10/20 218 lb (98.9 kg)  02/14/20 220 lb (99.8 kg)   Constitutional: overweight, in NAD Eyes: PERRLA, EOMI, no exophthalmos ENT: moist mucous membranes, no thyromegaly, no cervical lymphadenopathy Cardiovascular: tachycardia, RR, No MRG Respiratory: CTA B Gastrointestinal: abdomen soft, NT, ND, BS+ Musculoskeletal: no deformities, strength intact in all 4 Skin: moist, warm, no rashes Neurological: no tremor with outstretched hands, DTR normal in all 4  ASSESSMENT: 1. DM2, insulin-dependent, uncontrolled, without long term complications, but with hyperglycemia  His test were negative for type 1 diabetes: Component     Latest Ref Rng 05/28/2015  C-Peptide     0.80 - 3.90 ng/mL 2.88  Glucose, Fasting     65 - 99 mg/dL 120 (H)  Glutamic Acid Decarb Ab     <5 IU/mL <5  Pancreatic Islet Cell Antibody     < 5 JDF Units <5   - His diabetes is difficult to manage because of multiple intolerances: - We tried to add Invokana but he did not tolerated due to increased urination.  - We cannot use a DPP 4 inhibitor or a GLP-1 receptor  agonist due to his history of pancreatitis.  - He had to stop  Actos >>  abd. Pain - we stopped Cycloset >> expensive and not effective - we tried Glipizide >> GI sxs: N/V/D - he tried the Dexcom G6 CGM but he felt that this was giving him a lot of errors and stopped  2. HTG  3.  Obesity class I  4.  Low TSH  PLAN:  1. Patient with longstanding, uncontrolled, type 2 diabetes, with improved control of his blood sugar at last visit after he started to improve his diet, started exercising came off Saphris.  This was increasing his blood sugars in the past.  At last visit, we increased his Lantus dose and decrease his NovoLog doses.  We continued Metformin.  HbA1c at that time was higher, at 8.6%. -Of note, he tried Ozempic but this caused nausea and abdominal pain so he had to stop. -At today's visit, sugars are much better, after switching to the new medication for his  bipolar disease.  I am hoping that this will be covered for him, since he is also feeling much better, referred to his circadian rhythm to being up during the day and sleep during the night, and improving his weight.  Sugars are mostly at goal with few hyperglycemic exceptions.  He is now also on the Atkins diet with fruits and vegetables.  I advised him that this I would not recommend to continue this high-protein diet long-term, but for only a short period of time.  He is working with a Engineer, maintenance (IT).  For now, I would not recommend to change his regimen. - I advised him to: Patient Instructions  Please continue: - Metformin ER 2000 mg with dinner - Lantus 15 units at bedtime - Novolog before meals: 0-15-15 units 2-3x a day  Please return in 3 to 4 months with your sugar log.  - we checked his HbA1c: 6.9% (lower) - advised to check sugars at different times of the day - 4x a day, rotating check times - advised for yearly eye exams >> he is not UTD as he did not have insurance, but plans to go at the end of the year - return  to clinic in 3-4 months  2. HL -History of hypertriglyceridemia -He had a repeat lipid panel on 05/03/2020 and his triglycerides were higher than before, and 345.  Also, AST and ALT were elevated.  He immediately started on a diet.  He continues on his diet.  -He continues fenofibrate 145 mg daily without side effects   3.  Obesity class I -Continues Metformin which is appetite reducing and weight stabilizing long-term -He lost 5 pounds -At last visit, he started to work on diet and exercise.  He continues to work with a nutritionist.  He is now on [employer], adapted to include more vegetables improved.  He is not exercising as much as before after switching to being up during the day, but stays active, mowing the lawn and doing other yard work. -At last visit, I suggested to [place] which should have helped with weight loss, but he could not tolerate it  4. Low TSH -Reviewed most recent TSH per records from PCP.  TSH was suppressed 5 weeks ago.  This is a new finding for him.  Previous TFTs were normal. -He does have tachycardia today, but this is not new for him.  He also has some weight loss, possibly related to diet and change in bipolar medicine but also possibly related to thyrotoxicosis. -We will check his TFTs today and add TSI antibodies  to screen for Graves' disease  Office Visit on 09/14/2020  Component Date Value Ref Range Status  . TSH 09/14/2020 0.02* 0.35 - 4.50 uIU/mL Final  . Free T4 09/14/2020 1.41  0.60 - 1.60 ng/dL Final   Comment: Specimens from patients who are undergoing biotin therapy and /or ingesting biotin supplements may contain high levels of biotin.  The higher biotin concentration in these specimens interferes with this Free T4 assay.  Specimens that contain high levels  of biotin may cause false high results for this Free T4 assay.  Please interpret results in light of the total clinical presentation of the patient.    . T3, Free 09/14/2020 3.9  2.3 - 4.2  pg/mL Final  . TSI 09/14/2020 <89  <140 % baseline Final   TSH is suppressed but free T4 and free T3 are normal while TSI antibodies are not elevated.  The picture is more consistent with resolving thyroiditis, rather than Graves' disease.  His prognosis is elevated, but this is actually improved for him.  I would be tempted to have him back recheck in approximately 3 weeks and if the tests are still abnormal, to check a thyroid uptake and scan.  I will also check with him if he is taking biotin.  Philemon Kingdom, MD PhD [facility] Endocrinology

## 2020-09-18 ENCOUNTER — Telehealth: Payer: Self-pay | Admitting: Psychiatry

## 2020-09-18 ENCOUNTER — Encounter: Payer: Self-pay | Admitting: Internal Medicine

## 2020-09-18 LAB — THYROID STIMULATING IMMUNOGLOBULIN: TSI: <89 % baseline (ref <140)

## 2020-09-18 NOTE — Telephone Encounter (Signed)
Patient's PA was denied initially, the office notes have been faxed for review. Will update when received.

## 2020-09-18 NOTE — Telephone Encounter (Signed)
Pt called checking status on PA for Caplyta. Also, will pick up samples today for Caplyta per nurse. Already in closet ready to pick up.   Contact # [phone]

## 2020-09-24 ENCOUNTER — Telehealth: Payer: Self-pay | Admitting: Psychiatry

## 2020-09-24 NOTE — Telephone Encounter (Signed)
Pt called and wants to know what the game plan is since the capalya has been denied. Please call him at [phone]

## 2020-09-24 NOTE — Telephone Encounter (Signed)
I'm currently working on an appeal but it takes time for submission and response.

## 2020-09-25 NOTE — Telephone Encounter (Signed)
Cody Short, can you check on samples of Caplyta 42 mg for him, Dr. Jennelle Human would like for him to have samples until follow up apt.

## 2020-09-26 NOTE — Telephone Encounter (Signed)
Rtc to patient and he reports this is the BEST medication he's ever been on. His endocrinologist is extremely happy. His Hemoglobin AIC is now 6.1, previoulsy 4 months ago it was 8.9.  Informed him Dr. Jennelle Human plans on writing a letter to help with getting his Caplyta due to the denials. He has 13 doses left. His apt is 10/09/2020. Informed him we would check with pharmaceutical rep on getting more medication.   His mood was good on the phone and he reports no side effects at this time.    Prior Authorization DENIED for Caplyta 42 mg, per [employer] the request was denied for an unapproved medical condition. The specific information needed is a documented diagnosis is consistent with FDA approved uses OR documentation supporting the diagnosis. Also documented trial and failure or contraindication to 3 formulary alternative medications similar in therapy class. Reference # [id number] needs to be written for an appeal. If provider wants to discuss with clinical reviewer at [facility], call [phone]. Also patient needs to sign the Appeals Form that is submitted. (nurse has)   [employer]. [phone] F. [phone]

## 2020-10-09 ENCOUNTER — Encounter: Payer: Self-pay | Admitting: Psychiatry

## 2020-10-09 ENCOUNTER — Telehealth (INDEPENDENT_AMBULATORY_CARE_PROVIDER_SITE_OTHER): Payer: 59 | Admitting: Psychiatry

## 2020-10-09 DIAGNOSIS — F411 Generalized anxiety disorder: Secondary | ICD-10-CM | POA: Diagnosis not present

## 2020-10-09 DIAGNOSIS — F250 Schizoaffective disorder, bipolar type: Secondary | ICD-10-CM | POA: Diagnosis not present

## 2020-10-09 DIAGNOSIS — F5105 Insomnia due to other mental disorder: Secondary | ICD-10-CM

## 2020-10-09 DIAGNOSIS — F4001 Agoraphobia with panic disorder: Secondary | ICD-10-CM

## 2020-10-09 DIAGNOSIS — F1211 Cannabis abuse, in remission: Secondary | ICD-10-CM

## 2020-10-09 MED ORDER — HYDROXYZINE PAMOATE 25 MG PO CAPS: 25.0000 mg | ORAL_CAPSULE | Freq: Four times a day (QID) | ORAL | 1 refills | Status: DC | PRN

## 2020-10-09 NOTE — Progress Notes (Signed)
Cody Short [id number] 1976/08/26 44 y.o.   Virtual Visit via [place]  I connected with pt by WebEx and verified that I am speaking with the correct person using two identifiers.   I discussed the limitations, risks, security and privacy concerns of performing an evaluation and management service by Jackquline [place] and the availability of in person appointments. I also discussed with the patient that there may be a patient responsible charge related to this service. The patient expressed understanding and agreed to proceed.  I discussed the assessment and treatment plan with the patient. The patient was provided an opportunity to ask questions and all were answered. The patient agreed with the plan and demonstrated an understanding of the instructions.   The patient was advised to call back or seek an in-person evaluation if the symptoms worsen or if the condition fails to improve as anticipated.  30 minutes video encounter from 300 until 330.  Patient was at home and provider was at the office.   Subjective:   Patient ID:  Cody Short is a 44 y.o. (DOB Mar 07, 1976) male.  Chief Complaint:  Chief Complaint  Patient presents with  . Follow-up  . Manic Behavior  . Depression     Cody Short presents to the office today for follow-up of several psychiatric diagnoses ...    seen August 2020.  For obsessive anxiety  Switched Lexapro 5 mg daily on May 18.  But he stated it made him hungry and so it was stopped in August.  He continued on Saphris 10 mg nightly, lithium 1200 mg daily, propranolol as needed tremor.  Lithium level was ordered.  He called Sept saying he was manic.  Had a lithium level 0.7 and so dose was increased to 1500 mg daily.    Last seen December 21, 2019.  He had some ongoing manic symptoms and Saphris was increased to 15 mg nightly.  02/07/2020 appointment with the following noted: He has made multiple phone calls to the office since that date.   He decided he wanted to stop lithium because of urinary problems.  He was warned this could lead to mania but he wanted to pursue it anyway.  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  Cody Short had been having bedwetting episodes which she attributed to the lithium.  He has not consistently taken Saphris 15 mg at bedtime since the office visit in February. He completely stopped lithium January 30, 2020 U frequency is much better and less thirsty.  Bedwetting resolved.  Reduced BM to 1-2 times daily.  Gassy.  Glucose is high.  B is hospitalized with unknown disease.  Insulin adjusted. Mental status is best it's ever been.  Less mania.  Less obsessing.  No worsening mania. Is taking Saphris 15 mg HS> Plan no med changes.  04/10/2020 appointment with the following noted: No longer on lithium.  Still frequent urination and thirst DT DM poorly controlled despite meds.  Endo says little else to take bc history of pancreatitis.  Says he goes to urinate q 15 mins. Plan: Wean Saphris in crossover to loxapine 150 mg HS over 2 weeks bc he and endocrinologist believe it's likely Saphris is cause of the inability to get his glucose to acceptable levels.   06/07/20 appt with the following noted: He's called a couple of times CO sleepiness and reduced loxapine to 50 mg HS. Started Vegan diet and BS is better 4 weeks ago.  Thinks glucose is better off the Saphris. Lost 6 #.   CO sleepiness and sleep 16 hours   He thinks it also drove up pulse and blood pressure. Asks about taking Saphris 10 and a second medication. Mood has been fine.   Plan: He wants to restart Saphris 10 mg HS and use a 2nd mood stabilizer. Start Saphris and reduce Loxapine  To 10 mg at night DT prior failure of 10 Mg Saphris with lithium.  06/27/2020 phone call from patient: Pt called to report Loxapine & Saphris is causing his BP to be very high and Saphris is causing blood sugar to be high. Apt  10/18. Need changes before then. Call back @ [phone]  MD response: He had wanted to go back on Saphris and reduce loxapine which we did.  Tell him to stop the Saphris and increase loxapine to 20 mg nightly.  He did not tolerate 50 mg of loxapine very well. Nurse TC withpt: Spoke with Cody Short and he will stop the Saphris, but feels the increase in the Loxapine to 20 mg will increase his blood sugars more. Explained that with coming off the [place] he needs to increase it a bit or he will not be stable with his moods. Advised him to stop the Saphris and would check with Dr. [name] about any other recommendations.   07/17/20 appt with the following noted: Doing terrible.  Needs melatonin to sleep.  Claims loxapine is elevating BP, pulse, TG, cholesterol, glucose.  Claimed Saphris did it too.   Sees PCP at end of October.   Started Vegan diet about 6 weeks ago and exercising.   A/P: There are no available mood stabilizers that do not have a warning of elevating blood sugar and cholesterol.  He has tried all the mood stabilizers that do not have this warning.  All of the available mood stabilizers left that have any effectiveness are antipsychotics and the FDA has required a class warning on all antipsychotics about blood sugar and cholesterol effects.  This is true despite the fact that not all antipsychotics are equal at increasing these risks.  The patient's reading of these potential side effects is complicating finding an effective treatment.  The only way to prove whether or not these meds are elevating his blood sugar and cholesterol is to stop the medications for a period of time.  This exposes him to manic risk but there is no chance of finding an adequate mood stabilizer that will satisfy him until we can prove whether or not these meds are in fact affecting his blood sugar, cholesterol, blood pressure, and pulse. Therefore he will stop the loxapine which is at a low dose. We will check labs in 3  weeks.  We will attempt follow-up in 4 weeks.  If he gets manic he will let us know.   08/13/2020 appointment with the following noted: Blood sugar is pretty much the same off the loxapine but BP and pulse improved from 110 to 70 range.  But had started BP med about mid September. Started counselor Sonia Side at [facility] and started nutritionsist.  Has changed and restricted diet. Sees PCP 07/24/20.  Endo is managing DM.   Mood has been really good.  Counseling helped the anxiety.  Getting 10 hours of sleep.  PCP suggested changing time going to bed earlier and it's helping.   Sometimes has racing thoughts and gets comments from others while playing video games that he needs to take a  break. Patient reports stable mood and deniesr irritable moods.  Denies appetite disturbance.  Patient reports that energy and motivation have been good.  Patient denies any difficulty with concentration.  Patient denies any suicidal ideation.  Admits he's easily stressed.   10/09/20 appt with following noted: Getting manic as hell and depressed as hell. Talking too much and obsessing over things.  Talking too loud and doing things extremely fast.  Only depressed a couple of days ago.  Manic sx are brief and not all the time. Sleep 7-8 hours and sleep schedule is more normal. Stayed on Caplyta and pleased overall with SE. BS is better but hypertension is ongoing. Low TSH. Still counseling. No delusions hallucinations since off drugs.  Works with [employer].  Prior psychiatric medications: risperidone with cognitive side effects, Abilify,  Zyprexa, Seroquel 600 mg, perphenazine, Saphris 20 , haloperidol, Geodon, Saphris, Vraylar, loxapine, Caplyta He's prone to EPS. Depakote (Note patient had severe pancreatitis from Depakote.), Equetro, N-acetylcysteine with minimal benefit, , Amantadine with vomiting.  Sertraline and lexapro sexual SE,   Propranolol for tremor, lithium 1500,    Lithium was stopped early April 2021 bc of diabetes insipidus.    Review of Systems:  Review of Systems  Endocrine: Positive for polydipsia and polyuria.  Genitourinary: Negative for decreased urine volume and frequency.  Skin:       Itching  Neurological: Negative for tremors and weakness.  Psychiatric/Behavioral: Positive for decreased concentration. Negative for agitation, behavioral problems, confusion, dysphoric mood, hallucinations, self-injury, sleep disturbance and suicidal ideas. The patient is nervous/anxious. The patient is not hyperactive.     Medications: I have reviewed the patient's current medications.  Current Outpatient Medications  Medication Sig Dispense Refill  . Blood Glucose Monitoring Suppl (ONETOUCH VERIO FLEX SYSTEM) w/Device KIT Use onetouch verio flex meter to check blood sugar 3 times daily. DX:E11.65 1 kit 0  . fenofibrate (TRICOR) 145 MG tablet Take 1 tablet (145 mg total) by mouth daily. 90 tablet 3  . glucose blood test strip Use OneTouch Verio strips as instructed to check blood sugar 7 times daily. DX:E11.65 700 each 1  . insulin aspart (NOVOLOG FLEXPEN) 100 UNIT/ML FlexPen Inject 25-35 Units into the skin 3 (three) times daily with meals. (Patient taking differently: Inject 15 Units into the skin 3 (three) times daily with meals. Patient taking 15 units with lunch and dinner) 60 mL 3  . Insulin Pen Needle (BD PEN NEEDLE NANO U/F) 32G X 4 MM MISC USE 4 TIMES DAILY WITH INSULIN PENS 400 each 3  . LANTUS SOLOSTAR 100 UNIT/ML Solostar Pen Inject 30 Units into the skin at bedtime. (Patient taking differently: Inject 15 Units into the skin at bedtime. Patient taking 15 units at bedtime) 32.4 mL 3  . Lumateperone Tosylate (CAPLYTA) 42 MG CAPS Take 42 mg by mouth daily. 30 capsule 2  . metFORMIN (GLUCOPHAGE-XR) 500 MG 24 hr tablet Take 4 tablets (2,000 mg total) by mouth daily with supper. 360 tablet 2  . OneTouch Delica Lancets 62G MISC 1 each by Does not apply  route 4 (four) times daily. Use Onetouch Delica lancets to check blood sugar 4 times daily. DX:E11.65 400 each 11  . valsartan (DIOVAN) 320 MG tablet Take 320 mg by mouth daily.    . hydrOXYzine (VISTARIL) 25 MG capsule Take 1 capsule (25 mg total) by mouth every 6 (six) hours as needed. 100 capsule 1   No current facility-administered medications for this visit.    Medication Side Effects:  Other: less tremor  Allergies:  Allergies  Allergen Reactions  . Divalproex Sodium Other (See Comments)    unknown  . Methylphenidate Hcl     Aggravate bipolar  . Oxycodone Hcl     REACTION: hallucinations  . Ozempic (0.25 Or 0.5 Mg-Dose) [Semaglutide(0.25 Or 0.65m-Dos)] Nausea Only  . Paroxetine     Aggravate bipolar disorder    Past Medical History:  Diagnosis Date  . 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 Bipolar affective disorder ([place]   . DM w/o Complication Type II 91/06/4173 . HYPERTRIGLYCERIDEMIA 10/14/2010  . Nocturia 07/04/2010  . PILAR CYST 08/03/2007   Cyst in groin-states comes up when blood sugar gets high. Recurs if cannot walk for a week.     . TOBACCO ABUSE, HX OF 07/31/2009    Family History  Problem Relation Age of Onset  . Diabetes Mother   . Breast cancer Mother        was told not hereditary  . Cirrhosis Mother        fatty liver  . Diabetes Father   . Melanoma Maternal Uncle   . Colon cancer Neg Hx   . Stomach cancer Neg Hx   . Pancreatitis Neg Hx   . Heart disease Neg Hx   . Kidney disease Neg Hx   . Liver disease Neg Hx     Social History   Socioeconomic History  . Marital status: Single    Spouse name: Not on file  . Number of children: Not on file  . Years of education: Not on file  . Highest education level: Not on file  Occupational History  . Not on file  Tobacco Use  . Smoking status: Former Smoker    Packs/day: 2.50    Years: 10.00    Pack years: 25.00    Types: Cigarettes    Quit date: 10/27/2006    Years since quitting: 13.9  . Smokeless  tobacco: Never Used  Substance and Sexual Activity  . Alcohol use: No    Alcohol/week: 0.0 standard drinks    Comment: none at all  . Drug use: No  . Sexual activity: Not on file  Other Topics Concern  . Not on file  Social History Narrative   Single. Not dating currently. No kids.    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[employer] for [employer]   Social Determinants of HRadio broadcast assistantStrain: Not on fComcastInsecurity: Not on file  Transportation Needs: Not on file  Physical Activity: Not on file  Stress: Not on file  Social Connections: Not on file  Intimate Partner Violence: Not on file    Past Medical History, Surgical history, Social history, and Family history were reviewed and updated as appropriate.   Please see review of systems for further details on the patient's review from today.   Objective:   Physical Exam:  There were no vitals taken for this visit.  Physical Exam Neurological:     Mental Status: He is alert and oriented to person, place, and time.     Cranial Nerves: No dysarthria.  Psychiatric:        Attention and Perception: Attention normal.        Mood and Affect: Mood is anxious. Mood is not depressed.        Speech: Speech normal. Speech is not  rapid and pressured or slurred.        Behavior: Behavior is not agitated or hyperactive. Behavior is cooperative.        Thought Content: Thought content is not paranoid or delusional. Thought content does not include homicidal or suicidal ideation. Thought content does not include homicidal or suicidal plan.        Cognition and Memory: Cognition and memory normal.     Comments: No paranoia.  No sig anxiety.  More manic Insight and judgment fair to poor. Is reading on the Internet about potential side effects is complicating treatment Chronically self-preoccupied and hyperthymic style a little worse. Intense  style continuously.     Lab Review:     Component Value Date/Time   NA 140 06/18/2018 1502   K 4.5 06/18/2018 1502   CL 107 06/18/2018 1502   CO2 19 (L) 06/18/2018 1502   GLUCOSE 129 (H) 06/18/2018 1502   BUN 14 06/18/2018 1502   CREATININE 1.24 06/18/2018 1502   CALCIUM 10.3 06/18/2018 1502   PROT 8.3 (H) 06/18/2018 1502   ALBUMIN 4.4 02/10/2017 1631   AST 26 06/18/2018 1502   ALT 24 06/18/2018 1502   ALKPHOS 54 02/10/2017 1631   BILITOT 0.4 06/18/2018 1502   GFRNONAA 84 04/02/2018 1551   GFRAA 97 04/02/2018 1551       Component Value Date/Time   WBC 11.3 (H) 06/27/2016 1441   RBC 4.52 06/27/2016 1441   HGB 13.5 06/27/2016 1441   HCT 39.0 06/27/2016 1441   PLT 248.0 06/27/2016 1441   MCV 86.3 06/27/2016 1441   MCH 29.6 01/28/2014 1250   MCHC 34.5 06/27/2016 1441   RDW 13.2 06/27/2016 1441   LYMPHSABS 1.9 06/27/2016 1441   MONOABS 0.8 06/27/2016 1441   EOSABS 0.2 06/27/2016 1441   BASOSABS 0.1 06/27/2016 1441    Lithium Lvl  Date Value Ref Range Status  09/09/2019 1.1 0.6 - 1.2 mmol/L Final    12/14/19 lithium level 0.6 (trough 13 hours) , normal BMP and calcium   No results found for: PHENYTOIN, PHENOBARB, VALPROATE, CBMZ   .res Assessment: Plan:    Surya was seen today for follow-up, manic behavior and depression.  Diagnoses and all orders for this visit:  Schizoaffective disorder, bipolar type (HCC)  Generalized anxiety disorder -     hydrOXYzine (VISTARIL) 25 MG capsule; Take 1 capsule (25 mg total) by mouth every 6 (six) hours as needed.  Panic disorder with agoraphobia  Insomnia due to mental condition  Marijuana abuse in remission   Cody Short has had significant disruptive mood and psychotic symptoms and substance abuse over the course of his treatment here.  It has been evident in chronic occupational dysfunction and some relational problems.  He is also been very prone to EPS making it difficult to get adequate mood stabilization.   Has a  history of severe pancreatitis from Depakote.  He did not have an adequate response to carbamazepine and lithium was insufficient to control his symptoms and monotherapy.  Patient was again convinced that loxapine is elevating his blood sugar, cholesterol, pulse, and blood pressure.  He was equally convinced that Saphris was elevating his blood sugar.  He reads about these potential side effects on the Internet. He is no longer conviced loxapine affect metabolics but not sure.  There are no available mood stabilizers that do not have a warning of elevating blood sugar and cholesterol.  He has tried all the mood stabilizers that do not have this warning.  All of the available mood stabilizers left that have any effectiveness are antipsychotics and the FDA has required a class warning on all antipsychotics about blood sugar and cholesterol effects.  This is true despite the fact that not all antipsychotics are equal and increasing these risks.  The patient's reading of these potential side effects is complicating finding an effective treatment.  The only way to prove whether or not these meds are elevating his blood sugar and cholesterol is to stop the medications for a period of time.  This exposes him to manic risk but there is no chance of finding an adequate mood stabilizer that will satisfy him until we can prove whether or not these meds are in fact affecting his blood sugar, cholesterol, blood pressure, and pulse.  In general he has not been able to afford branded medications which further limits options such as the use of Latuda which has a lower metabolic risk than other antipsychotics.  He is also EPS prone which further limits options. Consider Caplyta bc low EPS and metabolics. Fanapt bc of his fear of elevated BP.   Rec not reading on Internet bc info is unreliable. Better chance of Caplyta bc only one dose is available so compliance likely to be better. Give samples of Caplyta 42 mg once  daily. Prefer no change befor ethe holidays bc he is not continuously manic or depressed. Likely switch to [place] after the holidays.  Discussed potential metabolic side effects associated with atypical antipsychotics, as well as potential risk for movement side effects. Advised pt to contact office if movement side effects occur.   Hydroxyzine 25 mg QID prn.   This will help anxiety and itching and may have some mild antimanic effect.  He has a low stress tolerance.  He can be easily agitated in public.  He has continued abstinence from marijuana is encouraged.  He has a history of cannabinoid hyperemesis which finally convinced him to discontinue the marijuana.  His mood disorder and thought disorganization have improved since being off the marijuana.  He has a very long history of heavy marijuana dependence.  Keep monitoring glucose.  Follow-up 6 weeks.   Lynder Parents, MD, DFAPA  Please see After Visit Summary for patient specific instructions.  Future Appointments  Date Time Provider [place]  01/18/2021  3:00 PM Philemon Kingdom, MD [id number] None    No orders of the defined types were placed in this encounter.     -------------------------------

## 2020-10-29 ENCOUNTER — Other Ambulatory Visit: Payer: Self-pay | Admitting: *Deleted

## 2020-10-29 ENCOUNTER — Encounter: Payer: Self-pay | Admitting: Internal Medicine

## 2020-10-29 DIAGNOSIS — E1165 Type 2 diabetes mellitus with hyperglycemia: Secondary | ICD-10-CM

## 2020-10-29 MED ORDER — NOVOLOG FLEXPEN 100 UNIT/ML ~~LOC~~ SOPN: 25.0000 [IU] | PEN_INJECTOR | Freq: Three times a day (TID) | SUBCUTANEOUS | 3 refills | Status: DC

## 2020-10-29 MED ORDER — LANTUS SOLOSTAR 100 UNIT/ML ~~LOC~~ SOPN: 30.0000 [IU] | PEN_INJECTOR | Freq: Every day | SUBCUTANEOUS | 3 refills | Status: DC

## 2020-11-19 ENCOUNTER — Other Ambulatory Visit: Payer: Self-pay | Admitting: Psychiatry

## 2020-11-19 ENCOUNTER — Encounter: Payer: Self-pay | Admitting: Psychiatry

## 2020-11-19 ENCOUNTER — Telehealth (INDEPENDENT_AMBULATORY_CARE_PROVIDER_SITE_OTHER): Payer: 59 | Admitting: Psychiatry

## 2020-11-19 DIAGNOSIS — F411 Generalized anxiety disorder: Secondary | ICD-10-CM

## 2020-11-19 DIAGNOSIS — F3110 Bipolar disorder, current episode manic without psychotic features, unspecified: Secondary | ICD-10-CM | POA: Diagnosis not present

## 2020-11-19 DIAGNOSIS — F5105 Insomnia due to other mental disorder: Secondary | ICD-10-CM

## 2020-11-19 DIAGNOSIS — F1211 Cannabis abuse, in remission: Secondary | ICD-10-CM

## 2020-11-19 DIAGNOSIS — F4001 Agoraphobia with panic disorder: Secondary | ICD-10-CM

## 2020-11-19 NOTE — Progress Notes (Signed)
Cody Short [id number] 06-16-1976 45 y.o.   Virtual Visit via Thor  I connected with pt by WebEx and verified that I am speaking with the correct person using two identifiers.   I discussed the limitations, risks, security and privacy concerns of performing an evaluation and management service by Cody Short and the availability of in person appointments. I also discussed with the patient that there may be a patient responsible charge related to this service. The patient expressed understanding and agreed to proceed.  I discussed the assessment and treatment plan with the patient. The patient was provided an opportunity to ask questions and all were answered. The patient agreed with the plan and demonstrated an understanding of the instructions.   The patient was advised to call back or seek an in-person evaluation if the symptoms worsen or if the condition fails to improve as anticipated.  30 minutes video encounter from 300 until 330.  Patient was at home and provider was at the office.   Subjective:   Patient ID:  Cody Short is a 45 y.o. (DOB 1976-07-12) male.  Chief Complaint:  Chief Complaint  Patient presents with  . Follow-up  . Manic Behavior  . Sleeping Problem     Cody Short presents to the office today for follow-up of several psychiatric diagnoses ...    seen August 2020.  For obsessive anxiety  Switched Lexapro 5 mg daily on May 18.  But he stated it made him hungry and so it was stopped in August.  He continued on Saphris 10 mg nightly, lithium 1200 mg daily, propranolol as needed tremor.  Lithium level was ordered.  He called Sept saying he was manic.  Had a lithium level 0.7 and so dose was increased to 1500 mg daily.    Last seen December 21, 2019.  He had some ongoing manic symptoms and Saphris was increased to 15 mg nightly.  02/07/2020 appointment with the following noted: He has made multiple phone calls to the office since that  date.  He decided he wanted to stop lithium because of urinary problems.  He was warned this could lead to mania but he wanted to pursue it anyway.  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  Cody Short had been having bedwetting episodes which she attributed to the lithium.  He has not consistently taken Saphris 15 mg at bedtime since the office visit in February. He completely stopped lithium January 30, 2020 U frequency is much better and less thirsty.  Bedwetting resolved.  Reduced BM to 1-2 times daily.  Gassy.  Glucose is high.  B is hospitalized with unknown disease.  Insulin adjusted. Mental status is best it's ever been.  Less mania.  Less obsessing.  No worsening mania. Is taking Saphris 15 mg HS> Plan no med changes.  04/10/2020 appointment with the following noted: No longer on lithium.  Still frequent urination and thirst DT DM poorly controlled despite meds.  Endo says little else to take bc history of pancreatitis.  Says he goes to urinate q 15 mins. Plan: Wean Saphris in crossover to loxapine 150 mg HS over 2 weeks bc he and endocrinologist believe it's likely Saphris is cause of the inability to get his glucose to acceptable levels.   06/07/20 appt with the following noted: He's called a couple of times CO sleepiness and reduced loxapine to 50 mg HS. Started Vegan diet and BS is better 4 weeks ago.  Thinks glucose is better off the Saphris. Lost 6 #.   CO sleepiness and sleep 16 hours   He thinks it also drove up pulse and blood pressure. Asks about taking Saphris 10 and a second medication. Mood has been fine.   Plan: He wants to restart Saphris 10 mg HS and use a 2nd mood stabilizer. Start Saphris and reduce Loxapine  To 10 mg at night DT prior failure of 10 Mg Saphris with lithium.  06/27/2020 phone call from patient: Pt called to report Loxapine & Saphris is causing his BP to be very high and Saphris is causing blood sugar to be high.  Apt 10/18. Need changes before then. Call back @ [phone]  MD response: He had wanted to go back on Saphris and reduce loxapine which we did.  Tell him to stop the Saphris and increase loxapine to 20 mg nightly.  He did not tolerate 50 mg of loxapine very well. Nurse TC withpt: Spoke with Cody Short and he will stop the Saphris, but feels the increase in the Loxapine to 20 mg will increase his blood sugars more. Explained that with coming off the [place] he needs to increase it a bit or he will not be stable with his moods. Advised him to stop the Saphris and would check with Dr. [name] about any other recommendations.   07/17/20 appt with the following noted: Doing terrible.  Needs melatonin to sleep.  Claims loxapine is elevating BP, pulse, TG, cholesterol, glucose.  Claimed Saphris did it too.   Sees PCP at end of October.   Started Vegan diet about 6 weeks ago and exercising.   A/P: There are no available mood stabilizers that do not have a warning of elevating blood sugar and cholesterol.  He has tried all the mood stabilizers that do not have this warning.  All of the available mood stabilizers left that have any effectiveness are antipsychotics and the FDA has required a class warning on all antipsychotics about blood sugar and cholesterol effects.  This is true despite the fact that not all antipsychotics are equal at increasing these risks.  The patient's reading of these potential Short effects is complicating finding an effective treatment.  The only way to prove whether or not these meds are elevating his blood sugar and cholesterol is to stop the medications for a period of time.  This exposes him to manic risk but there is no chance of finding an adequate mood stabilizer that will satisfy him until we can prove whether or not these meds are in fact affecting his blood sugar, cholesterol, blood pressure, and pulse. Therefore he will stop the loxapine which is at a low dose. We will check labs in 3  weeks.  We will attempt follow-up in 4 weeks.  If he gets manic he will let us know.   08/13/2020 appointment with the following noted: Blood sugar is pretty much the same off the loxapine but BP and pulse improved from 110 to 70 range.  But had started BP med about mid September. Started counselor Cody Short at [facility] and started nutritionsist.  Has changed and restricted diet. Sees PCP 07/24/20.  Endo is managing DM.   Mood has been really good.  Counseling helped the anxiety.  Getting 10 hours of sleep.  PCP suggested changing time going to bed earlier and it's helping.   Sometimes has racing thoughts and gets comments from others while playing video games that he needs to take a  break. Patient reports stable mood and deniesr irritable moods.  Denies appetite disturbance.  Patient reports that energy and motivation have been good.  Patient denies any difficulty with concentration.  Patient denies any suicidal ideation.  Admits he's easily stressed.   10/09/20 appt with following noted: Getting manic as hell and depressed as hell. Talking too much and obsessing over things.  Talking too loud and doing things extremely fast.  Only depressed a couple of days ago.  Manic sx are brief and not all the time. Sleep 7-8 hours and sleep schedule is more normal. Stayed on Caplyta and pleased overall with SE. BS is better but hypertension is ongoing. Low TSH. Still counseling. No delusions hallucinations since off drugs. Give samples of Caplyta 42 mg once daily. Prefer no change befor ethe holidays bc he is not continuously manic or depressed. Likely switch to [place] after the holidays.  11/19/2020 appointment with the following noted: Best I've ever been except some controllable manic.  Able to do things fast and easily.  Anxiety medicine and counseling has helped.  Better function.  No SE. Not depressed in a while.  But not sure the Capylta helps mania.   Lost from 230# to  189# with diet and exercise over a long period.   Sleep 4-8 hours, but not that I can't sleep but don't feel like sleeping.  Not itching any more.  I'm a a whole new person.   I can't keep up the mania.  Driving my parents and friends crazy. Blood sugar better and off insulin.  Only taking metformin. Can't afford Caplyta. So feels he must change. Hydroxyzine really helped anxiety.  Works with [employer].  Prior psychiatric medications: risperidone with cognitive Short effects, Abilify,  Zyprexa, Seroquel 600 mg, perphenazine, Saphris 20 , haloperidol, Geodon, Saphris, Vraylar, loxapine, Caplyta He's prone to EPS. Depakote (Note patient had severe pancreatitis from Depakote.) , Equetro, N-acetylcysteine with minimal benefit, , Amantadine with vomiting.  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    Review of Systems:  Review of Systems  Endocrine: Positive for polydipsia and polyuria.  Genitourinary: Negative for decreased urine volume and frequency.  Skin:       Itching  Neurological: Negative for tremors and weakness.  Psychiatric/Behavioral: Positive for behavioral problems and decreased concentration. Negative for agitation, confusion, dysphoric mood, hallucinations, self-injury, sleep disturbance and suicidal ideas. The patient is hyperactive.     Medications: I have reviewed the patient's current medications.  Current Outpatient Medications  Medication Sig Dispense Refill  . Blood Glucose Monitoring Suppl (ONETOUCH VERIO FLEX SYSTEM) w/Device KIT Use onetouch verio flex meter to check blood sugar 3 times daily. DX:E11.65 1 kit 0  . fenofibrate (TRICOR) 145 MG tablet Take 1 tablet (145 mg total) by mouth daily. 90 tablet 3  . glucose blood test strip Use OneTouch Verio strips as instructed to check blood sugar 7 times daily. DX:E11.65 700 each 1  . hydrOXYzine (VISTARIL) 25 MG  capsule Take 1 capsule (25 mg total) by mouth every 6 (six) hours as needed. 100 capsule 1  . insulin aspart (NOVOLOG FLEXPEN) 100 UNIT/ML FlexPen Inject 25-35 Units into the skin 3 (three) times daily with meals. 60 mL 3  . Insulin Pen Needle (BD PEN NEEDLE NANO U/F) 32G X 4 MM MISC USE 4 TIMES DAILY WITH INSULIN PENS 400 each 3  . LANTUS SOLOSTAR 100 UNIT/ML Solostar  Pen Inject 30 Units into the skin at bedtime. 32.4 mL 3  . Lumateperone Tosylate (CAPLYTA) 42 MG CAPS Take 42 mg by mouth daily. 30 capsule 2  . metFORMIN (GLUCOPHAGE-XR) 500 MG 24 hr tablet Take 4 tablets (2,000 mg total) by mouth daily with supper. 360 tablet 2  . OneTouch Delica Lancets 70Y MISC 1 each by Does not apply route 4 (four) times daily. Use Onetouch Delica lancets to check blood sugar 4 times daily. DX:E11.65 400 each 11  . valsartan (DIOVAN) 320 MG tablet Take 320 mg by mouth daily.     No current facility-administered medications for this visit.    Medication Short Effects: Other: less tremor  Allergies:  Allergies  Allergen Reactions  . Divalproex Sodium Other (See Comments)    unknown  . Methylphenidate Hcl     Aggravate bipolar  . Oxycodone Hcl     REACTION: hallucinations  . Ozempic (0.25 Or 0.5 Mg-Dose) [Semaglutide(0.25 Or 0.61m-Dos)] Nausea Only  . Paroxetine     Aggravate bipolar disorder    Past Medical History:  Diagnosis Date  . 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 Bipolar affective disorder ([place]   . DM w/o Complication Type II 96/12/7856 . HYPERTRIGLYCERIDEMIA 10/14/2010  . Nocturia 07/04/2010  . PILAR CYST 08/03/2007   Cyst in groin-states comes up when blood sugar gets high. Recurs if cannot walk for a week.     . TOBACCO ABUSE, HX OF 07/31/2009    Family History  Problem Relation Age of Onset  . Diabetes Mother   . Breast cancer Mother        was told not hereditary  . Cirrhosis Mother        fatty liver  . Diabetes Father   . Melanoma Maternal Uncle   . Colon cancer Neg Hx   .  Stomach cancer Neg Hx   . Pancreatitis Neg Hx   . Heart disease Neg Hx   . Kidney disease Neg Hx   . Liver disease Neg Hx     Social History   Socioeconomic History  . Marital status: Single    Spouse name: Not on file  . Number of children: Not on file  . Years of education: Not on file  . Highest education level: Not on file  Occupational History  . Not on file  Tobacco Use  . Smoking status: Former Smoker    Packs/day: 2.50    Years: 10.00    Pack years: 25.00    Types: Cigarettes    Quit date: 10/27/2006    Years since quitting: 14.0  . Smokeless tobacco: Never Used  Substance and Sexual Activity  . Alcohol use: No    Alcohol/week: 0.0 standard drinks    Comment: none at all  . Drug use: No  . Sexual activity: Not on file  Other Topics Concern  . Not on file  Social History Narrative   Single. Not dating currently. No kids.    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[employer] for [employer]   Social Determinants of Health   Financial Resource Strain: Not on fComcastInsecurity: Not on file  Transportation Needs: Not on file  Physical Activity: Not on file  Stress: Not on file  Social Connections: Not on file  Intimate Partner Violence: Not on file    Past Medical History, Surgical history,  Social history, and Family history were reviewed and updated as appropriate.   Please see review of systems for further details on the patient's review from today.   Objective:   Physical Exam:  There were no vitals taken for this visit.  Physical Exam Neurological:     Mental Status: He is alert and oriented to person, place, and time.     Cranial Nerves: No dysarthria.  Psychiatric:        Attention and Perception: Attention normal.        Mood and Affect: Mood is elated. Mood is not anxious or depressed.        Speech: Speech is rapid and pressured. Speech is not slurred.         Behavior: Behavior is not agitated or hyperactive. Behavior is cooperative.        Thought Content: Thought content is not paranoid or delusional. Thought content does not include homicidal or suicidal ideation. Thought content does not include homicidal or suicidal plan.        Cognition and Memory: Cognition and memory normal.     Comments: No paranoia.  No sig anxiety.  More manic Insight and judgment fair to poor. Is reading on the Internet about potential Short effects is complicating treatment Chronically self-preoccupied and hyperthymic style a little worse. Intense style continuously.     Lab Review:     Component Value Date/Time   NA 140 06/18/2018 1502   K 4.5 06/18/2018 1502   CL 107 06/18/2018 1502   CO2 19 (L) 06/18/2018 1502   GLUCOSE 129 (H) 06/18/2018 1502   BUN 14 06/18/2018 1502   CREATININE 1.24 06/18/2018 1502   CALCIUM 10.3 06/18/2018 1502   PROT 8.3 (H) 06/18/2018 1502   ALBUMIN 4.4 02/10/2017 1631   AST 26 06/18/2018 1502   ALT 24 06/18/2018 1502   ALKPHOS 54 02/10/2017 1631   BILITOT 0.4 06/18/2018 1502   GFRNONAA 84 04/02/2018 1551   GFRAA 97 04/02/2018 1551       Component Value Date/Time   WBC 11.3 (H) 06/27/2016 1441   RBC 4.52 06/27/2016 1441   HGB 13.5 06/27/2016 1441   HCT 39.0 06/27/2016 1441   PLT 248.0 06/27/2016 1441   MCV 86.3 06/27/2016 1441   MCH 29.6 01/28/2014 1250   MCHC 34.5 06/27/2016 1441   RDW 13.2 06/27/2016 1441   LYMPHSABS 1.9 06/27/2016 1441   MONOABS 0.8 06/27/2016 1441   EOSABS 0.2 06/27/2016 1441   BASOSABS 0.1 06/27/2016 1441    Lithium Lvl  Date Value Ref Range Status  09/09/2019 1.1 0.6 - 1.2 mmol/L Final    12/14/19 lithium level 0.6 (trough 13 hours) , normal BMP and calcium   No results found for: PHENYTOIN, PHENOBARB, VALPROATE, CBMZ   .res Assessment: Plan:    Majesty was seen today for follow-up, manic behavior and sleeping problem.  Diagnoses and all orders for this visit:  Bipolar I disorder,  most recent episode (or current) manic ([place])  Generalized anxiety disorder  Panic disorder with agoraphobia  Insomnia due to mental condition  Marijuana abuse in remission   Cody Short has had significant disruptive mood and psychotic symptoms and substance abuse over the course of his treatment here.  It has been evident in chronic occupational dysfunction and some relational problems.  He is also been very prone to EPS making it difficult to get adequate mood stabilization.   Has a history of severe pancreatitis from Depakote.  He did not have an  adequate response to carbamazepine and lithium was insufficient to control his symptoms and monotherapy.  Patient was again convinced that loxapine is elevating his blood sugar, cholesterol, pulse, and blood pressure.  He was equally convinced that Saphris was elevating his blood sugar.  He reads about these potential Short effects on the Internet. He is no longer conviced loxapine affect metabolics but not sure.  There are no available mood stabilizers that do not have a warning of elevating blood sugar and cholesterol.  He has tried all the mood stabilizers that do not have this warning.  All of the available mood stabilizers left that have any effectiveness are antipsychotics and the FDA has required a class warning on all antipsychotics about blood sugar and cholesterol effects.  This is true despite the fact that not all antipsychotics are equal and increasing these risks.  The patient's reading of these potential Short effects is complicating finding an effective treatment.  The only way to prove whether or not these meds are elevating his blood sugar and cholesterol is to stop the medications for a period of time.  This exposes him to manic risk but there is no chance of finding an adequate mood stabilizer that will satisfy him until we can prove whether or not these meds are in fact affecting his blood sugar, cholesterol, blood pressure, and pulse.  In  general he has not been able to afford branded medications which further limits options such as the use of Latuda which has a lower metabolic risk than other antipsychotics.  He is also EPS prone which further limits options.  DC Caplyta DT mania.   switch to Latuda 40 then 80 mg with food.  Pick up samples.  Discussed potential metabolic Short effects associated with atypical antipsychotics, as well as potential risk for movement Short effects. Advised pt to contact office if movement Short effects occur.   Hydroxyzine 25 mg QID prn.  This has been helpful. This will help anxiety and itching and may have some mild antimanic effect.  He has a low stress tolerance.  He can be easily agitated in public.  He has continued abstinence from marijuana is encouraged.  He has a history of cannabinoid hyperemesis which finally convinced him to discontinue the marijuana.  His mood disorder and thought disorganization have improved since being off the marijuana.  He has a very long history of heavy marijuana dependence.   Keep monitoring glucose.  Better with weight loss and Caplyta.  Follow-up 6 weeks.   Lynder Parents, MD, DFAPA  Please see After Visit Summary for patient specific instructions.  Future Appointments  Date Time Provider [place]  12/20/2020 11:00 AM Cottle, Billey Co., MD [facility] None  01/18/2021  3:00 PM Philemon Kingdom, MD [facility] None    No orders of the defined types were placed in this encounter.     -------------------------------

## 2020-12-03 ENCOUNTER — Telehealth: Payer: Self-pay | Admitting: Psychiatry

## 2020-12-03 NOTE — Telephone Encounter (Signed)
Patient rtc and he did report he's doing great on the medication but is still having mania. Reports staying up all night playing video games then asleep about 5 am and back up at noon. He is taking medication with 350 calories also. Advised him to increase to 120 mg Latuda daily. He reports having only 40 mg tablets at home so instructed him to take 3 tablets and I would get more samples out for him to pick up tomorrow. He agreed and verbalized understanding.

## 2020-12-03 NOTE — Telephone Encounter (Signed)
Patient acknowledged that his appointment on January 24 that he was manic while taking Caplyta.  He also acknowledged that his family saw him as being manic.  We discontinued Caplyta and switched him to [place].  If he made the switch and increase the dose in a timely manner he should have been on 80 mg daily for a week now.  His parents are complaining that he is still manic.  Provided he is tolerating the Latuda reasonably well have him increase to 120 mg daily.  He can take 1-1/2 of the 80 mg tablets until he runs out and then we can send in a refill for 120 mg daily.  Remind him he needs to take that with at least 350 cal.

## 2020-12-03 NOTE — Telephone Encounter (Signed)
Parents called and want dr. Jennelle Human to know that Cody Short is maniac and he is having physical aggression and all he does is play video games 24 hours a day and is not sleeping. Parents feel like Cody Short is not giving you the whole picture and needs an adjustment to his medicine. Mom asked if you would call her back , however there is no consent on file to speak to them. But if you want to talk to gail please call her at [phone]

## 2020-12-03 NOTE — Telephone Encounter (Signed)
Left patient a voicemail to call back to discuss his medication

## 2020-12-07 ENCOUNTER — Telehealth: Payer: Self-pay | Admitting: Psychiatry

## 2020-12-07 ENCOUNTER — Telehealth: Payer: Self-pay | Admitting: Internal Medicine

## 2020-12-07 ENCOUNTER — Encounter: Payer: Self-pay | Admitting: Internal Medicine

## 2020-12-07 NOTE — Telephone Encounter (Signed)
Patient requests to be called at ph# [phone] to let him know when Dr. Elvera Lennox wants Patient to schedule Lab appointment

## 2020-12-07 NOTE — Telephone Encounter (Signed)
I spoke with Cody Short last week and advised him to try and take with dinner instead of waiting till 1 am or later and eating 350 calories when finally going to bed. Now he's asking to go back to taking at bedtime. Which I'm not sure will effective his appetite any different.   Any suggestions?

## 2020-12-07 NOTE — Telephone Encounter (Signed)
He is unlikely to comply with recommendations about routine sleep and wake times.  He is in a habit of playing video games late into the night.  If he prefers he can split the dosage of the Latuda between breakfast and another meal.  But it is very important that he get the full dose in every day and take it with 350 cal.

## 2020-12-07 NOTE — Telephone Encounter (Signed)
Patient called again about his Latuda. He states that it makes him really hungry after he takes it, he takes a nap and wakes up hungry again. He would like to know would it be okay to take before bed instead.

## 2020-12-10 NOTE — Telephone Encounter (Signed)
Left voicemail to call back to discuss

## 2020-12-10 NOTE — Telephone Encounter (Signed)
Rtc to patient and he reports he has to take it all at hs because it makes him sleepy, I advised him to continue that regimen. He also reports not sleeping well but he's been drinking caffeine later in the day. Advised him to not drink any caffeine past 2-3 pm. He agreed. He reports his blood sugars have been elevated some, not as bad but it is up some.   Recommend he discuss with Dr. Jennelle Human at his upcoming apt on 12/20/2020 and he agreed

## 2020-12-11 ENCOUNTER — Other Ambulatory Visit: Payer: Self-pay

## 2020-12-11 ENCOUNTER — Other Ambulatory Visit (INDEPENDENT_AMBULATORY_CARE_PROVIDER_SITE_OTHER): Payer: 59

## 2020-12-11 DIAGNOSIS — R7989 Other specified abnormal findings of blood chemistry: Secondary | ICD-10-CM

## 2020-12-11 LAB — T4, FREE: Free T4: 1.56 ng/dL (ref 0.60–1.60)

## 2020-12-11 LAB — T3, FREE: T3, Free: 4.1 pg/mL (ref 2.3–4.2)

## 2020-12-11 LAB — TSH: TSH: <0.01 u[IU]/mL — ABNORMAL LOW (ref 0.35–4.50)

## 2020-12-11 NOTE — Telephone Encounter (Signed)
Called and schedule lab appt.

## 2020-12-12 ENCOUNTER — Other Ambulatory Visit: Payer: Self-pay | Admitting: Internal Medicine

## 2020-12-12 DIAGNOSIS — E0590 Thyrotoxicosis, unspecified without thyrotoxic crisis or storm: Secondary | ICD-10-CM

## 2020-12-20 ENCOUNTER — Telehealth (INDEPENDENT_AMBULATORY_CARE_PROVIDER_SITE_OTHER): Payer: 59 | Admitting: Psychiatry

## 2020-12-20 ENCOUNTER — Encounter: Payer: Self-pay | Admitting: Psychiatry

## 2020-12-20 DIAGNOSIS — F1211 Cannabis abuse, in remission: Secondary | ICD-10-CM

## 2020-12-20 DIAGNOSIS — F5105 Insomnia due to other mental disorder: Secondary | ICD-10-CM

## 2020-12-20 DIAGNOSIS — F411 Generalized anxiety disorder: Secondary | ICD-10-CM | POA: Diagnosis not present

## 2020-12-20 DIAGNOSIS — F4001 Agoraphobia with panic disorder: Secondary | ICD-10-CM | POA: Diagnosis not present

## 2020-12-20 DIAGNOSIS — F3110 Bipolar disorder, current episode manic without psychotic features, unspecified: Secondary | ICD-10-CM

## 2020-12-20 MED ORDER — LATUDA 120 MG PO TABS: 1.0000 | ORAL_TABLET | Freq: Every evening | ORAL | 1 refills | Status: DC

## 2020-12-20 NOTE — Progress Notes (Signed)
Cody Short [id number] [birth year] 45 y.o.   Virtual Visit via Telephone Note  I connected with pt by telephone and verified that I am speaking with the correct person using two identifiers.   I discussed the limitations, risks, security and privacy concerns of performing an evaluation and management service by telephone and the availability of in person appointments. I also discussed with the patient that there may be a patient responsible charge related to this service. The patient expressed understanding and agreed to proceed.  I discussed the assessment and treatment plan with the patient. The patient was provided an opportunity to ask questions and all were answered. The patient agreed with the plan and demonstrated an understanding of the instructions.   The patient was advised to call back or seek an in-person evaluation if the symptoms worsen or if the condition fails to improve as anticipated.  I provided 30 minutes of non-face-to-face time during this encounter. The call started at 11:00 and ended at 1130. The patient was located at home and the provider was located office.   Subjective:   Patient ID:  Cody Short is a 45 y.o. (DOB 09-13-1976) male.  Chief Complaint:  Chief Complaint  Patient presents with  . Follow-up  . Bipolar I disorder, most recent episode (or current) manic   . Manic Behavior  . Sleeping Problem     Cody Short presents to the office today for follow-up of several psychiatric diagnoses ...    seen August 2020.  For obsessive anxiety  Switched Lexapro 5 mg daily on May 18.  But he stated it made him hungry and so it was stopped in August.  He continued on Saphris 10 mg nightly, lithium 1200 mg daily, propranolol as needed tremor.  Lithium level was ordered.  He called Sept saying he was manic.  Had a lithium level 0.7 and so dose was increased to 1500 mg daily.    Last seen December 21, 2019.  He had some ongoing  manic symptoms and Saphris was increased to 15 mg nightly.  02/07/2020 appointment with the following noted: He has made multiple phone calls to the office since that date.  He decided he wanted to stop lithium because of urinary problems.  He was warned this could lead to mania but he wanted to pursue it anyway.  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  Cody Short had been having bedwetting episodes which she attributed to the lithium.  He has not consistently taken Saphris 15 mg at bedtime since the office visit in February. He completely stopped lithium January 30, 2020 U frequency is much better and less thirsty.  Bedwetting resolved.  Reduced BM to 1-2 times daily.  Gassy.  Glucose is high.  B is hospitalized with unknown disease.  Insulin adjusted. Mental status is best it's ever been.  Less mania.  Less obsessing.  No worsening mania. Is taking Saphris 15 mg HS> Plan no med changes.  04/10/2020 appointment with the following noted: No longer on lithium.  Still frequent urination and thirst DT DM poorly controlled despite meds.  Endo says little else to take bc history of pancreatitis.  Says he goes to urinate q 15 mins. Plan: Wean Saphris in crossover to loxapine 150 mg HS over 2 weeks bc he and endocrinologist believe it's likely Saphris is cause of the inability to get his glucose to acceptable levels.   06/07/20 appt with the following noted:  He's called a couple of times CO sleepiness and reduced loxapine to 50 mg HS. Started Vegan diet and BS is better 4 weeks ago.  Thinks glucose is better off the Saphris. Lost 6 #.   CO sleepiness and sleep 16 hours   He thinks it also drove up pulse and blood pressure. Asks about taking Saphris 10 and a second medication. Mood has been fine.   Plan: He wants to restart Saphris 10 mg HS and use a 2nd mood stabilizer. Start Saphris and reduce Loxapine  To 10 mg at night DT prior failure of 10 Mg Saphris with  lithium.  06/27/2020 phone call from patient: Pt called to report Loxapine & Saphris is causing his BP to be very high and Saphris is causing blood sugar to be high. Apt 10/18. Need changes before then. Call back @ [phone]  MD response: He had wanted to go back on Saphris and reduce loxapine which we did.  Tell him to stop the Saphris and increase loxapine to 20 mg nightly.  He did not tolerate 50 mg of loxapine very well. Nurse TC withpt: Spoke with Cody Short and he will stop the Saphris, but feels the increase in the Loxapine to 20 mg will increase his blood sugars more. Explained that with coming off the [place] he needs to increase it a bit or he will not be stable with his moods. Advised him to stop the Saphris and would check with Dr. [name] about any other recommendations.   07/17/20 appt with the following noted: Doing terrible.  Needs melatonin to sleep.  Claims loxapine is elevating BP, pulse, TG, cholesterol, glucose.  Claimed Saphris did it too.   Sees PCP at end of October.   Started Vegan diet about 6 weeks ago and exercising.   A/P: There are no available mood stabilizers that do not have a warning of elevating blood sugar and cholesterol.  He has tried all the mood stabilizers that do not have this warning.  All of the available mood stabilizers left that have any effectiveness are antipsychotics and the FDA has required a class warning on all antipsychotics about blood sugar and cholesterol effects.  This is true despite the fact that not all antipsychotics are equal at increasing these risks.  The patient's reading of these potential side effects is complicating finding an effective treatment.  The only way to prove whether or not these meds are elevating his blood sugar and cholesterol is to stop the medications for a period of time.  This exposes him to manic risk but there is no chance of finding an adequate mood stabilizer that will satisfy him until we can prove whether or not these  meds are in fact affecting his blood sugar, cholesterol, blood pressure, and pulse. Therefore he will stop the loxapine which is at a low dose. We will check labs in 3 weeks.  We will attempt follow-up in 4 weeks.  If he gets manic he will let us know.   08/13/2020 appointment with the following noted: Blood sugar is pretty much the same off the loxapine but BP and pulse improved from 110 to 70 range.  But had started BP med about mid September. Started counselor Sonia Side at [facility] and started nutritionsist.  Has changed and restricted diet. Sees PCP 07/24/20.  Endo is managing DM.   Mood has been really good.  Counseling helped the anxiety.  Getting 10 hours of sleep.  PCP suggested changing time going to  bed earlier and it's helping.   Sometimes has racing thoughts and gets comments from others while playing video games that he needs to take a break. Patient reports stable mood and deniesr irritable moods.  Denies appetite disturbance.  Patient reports that energy and motivation have been good.  Patient denies any difficulty with concentration.  Patient denies any suicidal ideation.  Admits he's easily stressed.   10/09/20 appt with following noted: Getting manic as hell and depressed as hell. Talking too much and obsessing over things.  Talking too loud and doing things extremely fast.  Only depressed a couple of days ago.  Manic sx are brief and not all the time. Sleep 7-8 hours and sleep schedule is more normal. Stayed on Caplyta and pleased overall with SE. BS is better but hypertension is ongoing. Low TSH. Still counseling. No delusions hallucinations since off drugs. Give samples of Caplyta 42 mg once daily. Prefer no change befor ethe holidays bc he is not continuously manic or depressed. Likely switch to [place] after the holidays.  11/19/2020 appointment with the following noted: Best I've ever been except some controllable manic.  Able to do things fast  and easily.  Anxiety medicine and counseling has helped.  Better function.  No SE. Not depressed in a while.  But not sure the Capylta helps mania.   Lost from 230# to 189# with diet and exercise over a long period.   Sleep 4-8 hours, but not that I can't sleep but don't feel like sleeping.  Not itching any more.  I'm a a whole new person.   I can't keep up the mania.  Driving my parents and friends crazy. Blood sugar better and off insulin.  Only taking metformin. Can't afford Caplyta. So feels he must change. Hydroxyzine really helped anxiety. Plan: DC Caplyta DT mania.  switch to Latuda 40 then 80 mg with food.  Pick up samples.  12/03/2020 phone call from parents:Parents called and want dr. [name] to know that Cody Short is maniac and he is having physical aggression and all he does is play video games 24 hours a day and is not sleeping. Parents feel like Cody Short is not giving you the whole picture and needs an adjustment to his medicine. MD response:atient acknowledged that his appointment on January 24 that he was manic while taking Caplyta.  He also acknowledged that his family saw him as being manic.  We discontinued Caplyta and switched him to [place].  If he made the switch and increase the dose in a timely manner he should have been on 80 mg daily for a week now.  His parents are complaining that he is still manic.  Provided he is tolerating the Latuda reasonably well have him increase to 120 mg daily.  He can take 1-1/2 of the 80 mg tablets until he runs out and then we can send in a refill for 120 mg daily.  Remind him he needs to take that with at least 350 cal. Phone call with patient from nurse: Patient rtc and he did report he's doing great on the medication but is still having mania. Reports staying up all night playing video games then asleep about 5 am and back up at noon. He is taking medication with 350 calories also. Advised him to increase to 120 mg Latuda daily. He reports having only 40 mg  tablets at home so instructed him to take 3 tablets and I would get more samples out for him to pick up  tomorrow. He agreed and verbalized understanding.  12/07/2020 phone call: Patient called again about his Latuda. He states that it makes him really hungry after he takes it, he takes a nap and wakes up hungry again. He would like to know would it be okay to take before bed instead. MD response: He is unlikely to comply with recommendations about routine sleep and wake times.  He is in a habit of playing video games late into the night.  If he prefers he can split the dosage of the Latuda between breakfast and another meal.  But it is very important that he get the full dose in every day and take it with 350 cal. Nursing phone call with patient:Rtc to patient and he reports he has to take it all at hs because it makes him sleepy, I advised him to continue that regimen. He also reports not sleeping well but he's been drinking caffeine later in the day. Advised him to not drink any caffeine past 2-3 pm. He agreed. He reports his blood sugars have been elevated some, not as bad but it is up some.   12/20/2020 appointment with the following noted: Mania tremendously better but occ anger outbursts anger.  6 hours of sleep with melatonin, hydroxyzine and Latuda 120 mg for 2 weeks.  Staying up all night playing video games. Anette Guarneri does make him hungry right after he takes it so takes it before bedtime.  Taking with food. Hyperactivity and loud talking is better.  I can function fast.  Parents see benefit with mania but are concerned about his anger outbursts but he things she takes stress out on him.  Blood sugar is generally good except when eats too much.  Taking it with dinner and HS.   Able to drive around town.  Can cook and clean.  Less ruminating on his past.  Less need for direction.   Counseling is helping at [facility] counseling. Hydroxyzine helped itching and anxiety.  Works with [employer].  Prior psychiatric medications: risperidone with cognitive side effects, Abilify NR,  Zyprexa SE glucose, Seroquel 600 mg SE wt, perphenazineSE, Saphris 20 partial response but SE increase glucose , haloperidol EPS, Geodon EPS,  Vraylar, loxapine SE, Caplyta NR, Latuda 120 He's prone to EPS. Depakote (Note patient had severe pancreatitis from Depakote.) , Equetro, Lithium was stopped early April 2021 bc of diabetes insipidus.   N-acetylcysteine with minimal benefit, , Amantadine with vomiting.  Sertraline and lexapro sexual SE,   Propranolol for tremor, lithium 1500,     Review of Systems:  Review of Systems  Endocrine: Positive for polydipsia and polyuria.  Genitourinary: Negative for decreased urine volume and frequency.  Skin:       Itching resolved with hydroxyzine  Neurological: Negative for tremors and weakness.  Psychiatric/Behavioral: Positive for behavioral problems and decreased concentration. Negative for agitation, confusion, dysphoric mood, hallucinations, self-injury, sleep disturbance and suicidal ideas. The patient is hyperactive.     Medications: I have reviewed the patient's current medications.  Current Outpatient Medications  Medication Sig Dispense Refill  . Blood Glucose Monitoring Suppl (ONETOUCH VERIO FLEX SYSTEM) w/Device KIT Use onetouch verio flex meter to check blood sugar 3 times daily. DX:E11.65 1 kit 0  . fenofibrate 160 MG tablet Take 160 mg by mouth daily.    Marland Kitchen glucose blood test strip Use OneTouch Verio strips as instructed to check blood sugar 7 times daily. DX:E11.65 700 each 1  . hydrOXYzine (VISTARIL) 25 MG capsule TAKE 1 CAPSULE (  25 MG TOTAL) BY MOUTH EVERY 6 (SIX) HOURS AS NEEDED. 100 capsule 1  . insulin aspart (NOVOLOG FLEXPEN) 100 UNIT/ML FlexPen Inject 25-35 Units into the skin 3 (three) times daily with meals. 60 mL 3  . Insulin Pen Needle (BD PEN NEEDLE NANO U/F) 32G X 4 MM MISC USE 4 TIMES DAILY WITH INSULIN PENS 400 each 3  .  LANTUS SOLOSTAR 100 UNIT/ML Solostar Pen Inject 30 Units into the skin at bedtime. 32.4 mL 3  . metFORMIN (GLUCOPHAGE-XR) 500 MG 24 hr tablet Take 4 tablets (2,000 mg total) by mouth daily with supper. 360 tablet 2  . OneTouch Delica Lancets 57Q MISC 1 each by Does not apply route 4 (four) times daily. Use Onetouch Delica lancets to check blood sugar 4 times daily. DX:E11.65 400 each 11  . valsartan (DIOVAN) 160 MG tablet Take 160 mg by mouth daily.    . Lurasidone HCl (LATUDA) 120 MG TABS Take 1 tablet (120 mg total) by mouth every evening. 30 tablet 1   No current facility-administered medications for this visit.    Medication Side Effects: Other: less tremor  Allergies:  Allergies  Allergen Reactions  . Divalproex Sodium Other (See Comments)    unknown  . Methylphenidate Hcl     Aggravate bipolar  . Oxycodone Hcl     REACTION: hallucinations  . Ozempic (0.25 Or 0.5 Mg-Dose) [Semaglutide(0.25 Or 0.81m-Dos)] Nausea Only  . Paroxetine     Aggravate bipolar disorder    Past Medical History:  Diagnosis Date  . 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 [place]   . DM w/o Complication Type II 94/03/9628 . HYPERTRIGLYCERIDEMIA 10/14/2010  . Nocturia 07/04/2010  . PILAR CYST 08/03/2007   Cyst in groin-states comes up when blood sugar gets high. Recurs if cannot walk for a week.     . TOBACCO ABUSE, HX OF 07/31/2009    Family History  Problem Relation Age of Onset  . Diabetes Mother   . Breast cancer Mother        was told not hereditary  . Cirrhosis Mother        fatty liver  . Diabetes Father   . Melanoma Maternal Uncle   . Colon cancer Neg Hx   . Stomach cancer Neg Hx   . Pancreatitis Neg Hx   . Heart disease Neg Hx   . Kidney disease Neg Hx   . Liver disease Neg Hx     Social History   Socioeconomic History  . Marital status: Single    Spouse name: Not on file  . Number of children: Not on file  . Years of education: Not on file  . Highest education  level: Not on file  Occupational History  . Not on file  Tobacco Use  . Smoking status: Former Smoker    Packs/day: 2.50    Years: 10.00    Pack years: 25.00    Types: Cigarettes    Quit date: 10/27/2006    Years since quitting: 14.1  . Smokeless tobacco: Never Used  Substance and Sexual Activity  . Alcohol use: No    Alcohol/week: 0.0 standard drinks    Comment: none at all  . Drug use: No  . Sexual activity: Not on file  Other Topics Concern  . Not on file  Social History Narrative   Single. Not dating currently. No kids.    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   Please see review of systems for further details on the patient's review from today.   Objective:   Physical Exam:  There were no vitals taken for this visit.  Physical Exam Neurological:     Mental Status: He is alert and oriented to person, place, and time.     Cranial Nerves: No dysarthria.  Psychiatric:        Attention and Perception: Attention normal.        Mood and Affect: Mood is not anxious, depressed or elated.        Speech: Speech is not rapid and pressured or slurred.        Behavior: Behavior is not agitated or hyperactive. Behavior is cooperative.        Thought Content: Thought content is not paranoid or delusional. Thought content does not include homicidal or suicidal ideation. Thought content does not include homicidal or suicidal plan.        Cognition and Memory: Cognition and memory normal.      Comments: No paranoia.  No sig anxiety.  Less manic Insight and judgment fair to poor. Chronically self-preoccupied and hyperthymic style a little better.  Less pressured and less manic but not totally gone. Intense style continuously.     Lab Review:     Component Value Date/Time   NA 140 06/18/2018 1502   K 4.5 06/18/2018 1502   CL 107 06/18/2018 1502   CO2 19 (L) 06/18/2018 1502   GLUCOSE 129 (H) 06/18/2018 1502   BUN 14 06/18/2018 1502   CREATININE 1.24 06/18/2018 1502   CALCIUM 10.3 06/18/2018 1502   PROT 8.3 (H) 06/18/2018 1502   ALBUMIN 4.4 02/10/2017 1631   AST 26 06/18/2018 1502   ALT 24 06/18/2018 1502   ALKPHOS 54 02/10/2017 1631   BILITOT 0.4 06/18/2018 1502   GFRNONAA 84 04/02/2018 1551   GFRAA 97 04/02/2018 1551       Component Value Date/Time   WBC 11.3 (H) 06/27/2016 1441   RBC 4.52 06/27/2016 1441   HGB 13.5 06/27/2016 1441   HCT 39.0 06/27/2016 1441   PLT 248.0 06/27/2016 1441   MCV 86.3 06/27/2016 1441   MCH 29.6 01/28/2014 1250   MCHC 34.5 06/27/2016 1441   RDW 13.2 06/27/2016 1441   LYMPHSABS 1.9 06/27/2016 1441   MONOABS 0.8 06/27/2016 1441   EOSABS 0.2 06/27/2016 1441   BASOSABS 0.1 06/27/2016 1441    Lithium Lvl  Date Value Ref Range Status  09/09/2019 1.1 0.6 - 1.2 mmol/L Final    12/14/19 lithium level 0.6 (trough 13 hours) , normal BMP and calcium   No results found for: PHENYTOIN, PHENOBARB, VALPROATE, CBMZ   .res Assessment: Plan:    Edoardo was seen today for follow-up, bipolar i disorder, most recent episode (or current) manic , manic behavior and sleeping problem.  Diagnoses and all orders for this visit:  Bipolar I disorder, most recent episode (or current) manic ([place]) -     Lurasidone HCl (LATUDA)  120 MG TABS; Take 1 tablet (120 mg total) by mouth every evening.  Generalized anxiety disorder  Panic disorder with agoraphobia  Insomnia due to mental condition  Marijuana abuse in remission   Cody Short has had  significant disruptive mood and psychotic symptoms and substance abuse over the course of his treatment here.  It has been evident in chronic occupational dysfunction and some relational problems.  He is also been very prone to EPS and elevated blood sugars from atypicals making it difficult to get adequate mood stabilization.   Has a history of severe pancreatitis from Depakote.  He did not have an adequate response to carbamazepine.   lithium was insufficient to control his symptoms in monotherapy and he developed diabetes insipidus.  There are no available mood stabilizers that do not have a warning of elevating blood sugar and cholesterol.  He has tried all the mood stabilizers that do not have this warning.  All of the available mood stabilizers left that have any effectiveness are antipsychotics and the FDA has required a class warning on all antipsychotics about blood sugar and cholesterol effects.  This is true despite the fact that not all antipsychotics are equal and increasing these risks.  The patient's reading of these potential side effects is complicating finding an effective treatment.  The only way to prove whether or not these meds are elevating his blood sugar and cholesterol is to stop the medications for a period of time.  This exposes him to manic risk but there is no chance of finding an adequate mood stabilizer that will satisfy him until we can prove whether or not these meds are in fact affecting his blood sugar, cholesterol, blood pressure, and pulse.  In general he has not been able to afford branded medications which further limits options such as the use of Latuda which has a lower metabolic risk than other antipsychotics.  He is also EPS prone which further limits options.  He has recently been manic but that is improving on Latuda 120 mg though it has not resolved over the last couple of weeks.  Continue Latuda 120 mg PM.  Is getting benefit but only been on it for a couple of  weeks. Better tolerated than others so far but he does say it makes him hungry which may become a problem in the future..  Discussed potential metabolic side effects associated with atypical antipsychotics, as well as potential risk for movement side effects. Advised pt to contact office if movement side effects occur.   Hydroxyzine 25 mg QID prn.  This has been helpful. This will help anxiety and itching and may have some mild antimanic effect.  He has a low stress tolerance.  He can be easily agitated in public.  He has continued abstinence from marijuana is encouraged.  He has a history of cannabinoid hyperemesis which finally convinced him to discontinue the marijuana.  His mood disorder and thought disorganization have improved since being off the marijuana.  He has a very long history of heavy marijuana dependence.   Continue monitoring blood sugar and following advice of endocrinologist.  Follow-up 4 weeks.   Lynder Parents, MD, DFAPA  Please see After Visit Summary for patient specific instructions.  Future Appointments  Date Time Provider [place]  01/02/2021 10:00 AM [facility] 1 [facility] [place]  01/02/2021  2:00 PM [facility] 2 [facility] [place]  01/03/2021 10:00 AM [facility] 1 [facility] [place]  01/17/2021  1:00 PM Cottle, Allayne Butcher  Brooke Bonito., MD [facility] None  01/18/2021  3:00 PM Philemon Kingdom, MD [facility] None  02/12/2021  2:00 PM Cottle, Billey Co., MD [facility] None    No orders of the defined types were placed in this encounter.     -------------------------------

## 2020-12-24 ENCOUNTER — Telehealth: Payer: Self-pay

## 2020-12-24 NOTE — Telephone Encounter (Signed)
Prior Authorization submitted and approved for LATUDA 120 MG effective 12/24/2020-12/23/2021 with [employer] PA# [id number] [facility]

## 2021-01-02 ENCOUNTER — Encounter (HOSPITAL_COMMUNITY)
Admission: RE | Admit: 2021-01-02 | Discharge: 2021-01-02 | Disposition: A | Discharge status: Home / Self Care | Payer: 59 | Source: Ambulatory Visit | Attending: Internal Medicine | Admitting: Internal Medicine

## 2021-01-02 ENCOUNTER — Other Ambulatory Visit: Payer: Self-pay

## 2021-01-02 DIAGNOSIS — E0590 Thyrotoxicosis, unspecified without thyrotoxic crisis or storm: Secondary | ICD-10-CM | POA: Insufficient documentation

## 2021-01-02 MED ORDER — SODIUM IODIDE I-123 7.4 MBQ CAPS
430.0000 | ORAL_CAPSULE | Freq: Once | ORAL | Status: AC
  Administered 2021-01-02: 430 via ORAL

## 2021-01-03 ENCOUNTER — Encounter (HOSPITAL_COMMUNITY)
Admission: RE | Admit: 2021-01-03 | Discharge: 2021-01-03 | Disposition: A | Discharge status: Home / Self Care | Payer: 59 | Source: Ambulatory Visit | Attending: Internal Medicine | Admitting: Internal Medicine

## 2021-01-15 ENCOUNTER — Encounter: Payer: Self-pay | Admitting: Internal Medicine

## 2021-01-15 DIAGNOSIS — E1165 Type 2 diabetes mellitus with hyperglycemia: Secondary | ICD-10-CM

## 2021-01-17 ENCOUNTER — Telehealth (INDEPENDENT_AMBULATORY_CARE_PROVIDER_SITE_OTHER): Payer: 59 | Admitting: Psychiatry

## 2021-01-17 ENCOUNTER — Encounter: Payer: Self-pay | Admitting: Psychiatry

## 2021-01-17 DIAGNOSIS — F3110 Bipolar disorder, current episode manic without psychotic features, unspecified: Secondary | ICD-10-CM | POA: Diagnosis not present

## 2021-01-17 DIAGNOSIS — F4001 Agoraphobia with panic disorder: Secondary | ICD-10-CM

## 2021-01-17 DIAGNOSIS — F5105 Insomnia due to other mental disorder: Secondary | ICD-10-CM

## 2021-01-17 DIAGNOSIS — F411 Generalized anxiety disorder: Secondary | ICD-10-CM

## 2021-01-17 DIAGNOSIS — F1211 Cannabis abuse, in remission: Secondary | ICD-10-CM

## 2021-01-17 MED ORDER — TOPIRAMATE 25 MG PO TABS: 25.0000 mg | ORAL_TABLET | Freq: Two times a day (BID) | ORAL | 1 refills | Status: DC

## 2021-01-17 MED ORDER — FREESTYLE LITE DEVI: 0 refills | Status: AC

## 2021-01-17 MED ORDER — FREESTYLE LITE TEST VI STRP: ORAL_STRIP | 3 refills | Status: DC

## 2021-01-17 NOTE — Progress Notes (Signed)
Cody OrionVincent P Short [id number] 11-Jan-1976 45 y.o.   Virtual Visit via Telephone Note  I connected with pt by telephone and verified that I am speaking with the correct person using two identifiers.   I discussed the limitations, risks, security and privacy concerns of performing an evaluation and management service by telephone and the availability of in person appointments. I also discussed with the patient that there may be a patient responsible charge related to this service. The patient expressed understanding and agreed to proceed.  I discussed the assessment and treatment plan with the patient. The patient was provided an opportunity to ask questions and all were answered. The patient agreed with the plan and demonstrated an understanding of the instructions.   The patient was advised to call back or seek an in-person evaluation if the symptoms worsen or if the condition fails to improve as anticipated.  I provided 30 minutes of non-face-to-face time during this encounter. The patient was located at home and the provider was located office.  Session started at 1 and ended 130pm.   Subjective:   Patient ID:  Cody Short is a 45 y.o. (DOB 11-Jan-1976) male.  Chief Complaint:  Chief Complaint  Patient presents with  . Follow-up  . Bipolar I disorder, most recent episode (or current) manic   . Schizoaffective disorder, bipolar type (HCC)  . Manic Behavior  . Eating Disorder     Cody Short presents to the office today for follow-up of several psychiatric diagnoses ...    seen August 2020.  For obsessive anxiety  Switched Lexapro 5 mg daily on May 18.  But he stated it made him hungry and so it was stopped in August.  He continued on Saphris 10 mg nightly, lithium 1200 mg daily, propranolol as needed tremor.  Lithium level was ordered.  He called Sept saying he was manic.  Had a lithium level 0.7 and so dose was increased to 1500 mg daily.    Last seen  December 21, 2019.  He had some ongoing manic symptoms and Saphris was increased to 15 mg nightly.  02/07/2020 appointment with the following noted: He has made multiple phone calls to the office since that date.  He decided he wanted to stop lithium because of urinary problems.  He was warned this could lead to mania but he wanted to pursue it anyway.  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  Cody Fickleaul had been having bedwetting episodes which she attributed to the lithium.  He has not consistently taken Saphris 15 mg at bedtime since the office visit in February. He completely stopped lithium January 30, 2020 U frequency is much better and less thirsty.  Bedwetting resolved.  Reduced BM to 1-2 times daily.  Gassy.  Glucose is high.  B is hospitalized with unknown disease.  Insulin adjusted. Mental status is best it's ever been.  Less mania.  Less obsessing.  No worsening mania. Is taking Saphris 15 mg HS> Plan no med changes.  04/10/2020 appointment with the following noted: No longer on lithium.  Still frequent urination and thirst DT DM poorly controlled despite meds.  Endo says little else to take bc history of pancreatitis.  Says he goes to urinate q 15 mins. Plan: Wean Saphris in crossover to loxapine 150 mg HS over 2 weeks bc he and endocrinologist believe it's likely Saphris is cause of the inability to get his glucose to acceptable levels.  06/07/20 appt with the following noted: He's called a couple of times CO sleepiness and reduced loxapine to 50 mg HS. Started Vegan diet and BS is better 4 weeks ago.  Thinks glucose is better off the Saphris. Lost 6 #.   CO sleepiness and sleep 16 hours   He thinks it also drove up pulse and blood pressure. Asks about taking Saphris 10 and a second medication. Mood has been fine.   Plan: He wants to restart Saphris 10 mg HS and use a 2nd mood stabilizer. Start Saphris and reduce Loxapine  To 10 mg at night  DT prior failure of 10 Mg Saphris with lithium.  06/27/2020 phone call from patient: Pt called to report Loxapine & Saphris is causing his BP to be very high and Saphris is causing blood sugar to be high. Apt 10/18. Need changes before then. Call back @ [phone]  MD response: He had wanted to go back on Saphris and reduce loxapine which we did.  Tell him to stop the Saphris and increase loxapine to 20 mg nightly.  He did not tolerate 50 mg of loxapine very well. Nurse TC withpt: Spoke with Cody Short and he will stop the Saphris, but feels the increase in the Loxapine to 20 mg will increase his blood sugars more. Explained that with coming off the Saphris he needs to increase it a bit or he will not be stable with his moods. Advised him to stop the Saphris and would check with Dr. Jennelle Human about any other recommendations.   07/17/20 appt with the following noted: Doing terrible.  Needs melatonin to sleep.  Claims loxapine is elevating BP, pulse, TG, cholesterol, glucose.  Claimed Saphris did it too.   Sees PCP at end of October.   Started Vegan diet about 6 weeks ago and exercising.   A/P: There are no available mood stabilizers that do not have a warning of elevating blood sugar and cholesterol.  He has tried all the mood stabilizers that do not have this warning.  All of the available mood stabilizers left that have any effectiveness are antipsychotics and the FDA has required a class warning on all antipsychotics about blood sugar and cholesterol effects.  This is true despite the fact that not all antipsychotics are equal at increasing these risks.  The patient's reading of these potential side effects is complicating finding an effective treatment.  The only way to prove whether or not these meds are elevating his blood sugar and cholesterol is to stop the medications for a period of time.  This exposes him to manic risk but there is no chance of finding an adequate mood stabilizer that will satisfy him  until we can prove whether or not these meds are in fact affecting his blood sugar, cholesterol, blood pressure, and pulse. Therefore he will stop the loxapine which is at a low dose. We will check labs in 3 weeks.  We will attempt follow-up in 4 weeks.  If he gets manic he will let us know.   08/13/2020 appointment with the following noted: Blood sugar is pretty much the same off the loxapine but BP and pulse improved from 110 to 70 range.  But had started BP med about mid September. Started counselor Illene Labrador at [facility] and started nutritionsist.  Has changed and restricted diet. Sees PCP 07/24/20.  Endo is managing DM.   Mood has been really good.  Counseling helped the anxiety.  Getting 10 hours of sleep.  PCP suggested changing time going to bed earlier and it's helping.   Sometimes has racing thoughts and gets comments from others while playing video games that he needs to take a break. Patient reports stable mood and deniesr irritable moods.  Denies appetite disturbance.  Patient reports that energy and motivation have been good.  Patient denies any difficulty with concentration.  Patient denies any suicidal ideation.  Admits he's easily stressed.   10/09/20 appt with following noted: Getting manic as hell and depressed as hell. Talking too much and obsessing over things.  Talking too loud and doing things extremely fast.  Only depressed a couple of days ago.  Manic sx are brief and not all the time. Sleep 7-8 hours and sleep schedule is more normal. Stayed on Caplyta and pleased overall with SE. BS is better but hypertension is ongoing. Low TSH. Still counseling. No delusions hallucinations since off drugs. Give samples of Caplyta 42 mg once daily. Prefer no change befor ethe holidays bc he is not continuously manic or depressed. Likely switch to Jordan after the holidays.  11/19/2020 appointment with the following noted: Best I've ever been except some  controllable manic.  Able to do things fast and easily.  Anxiety medicine and counseling has helped.  Better function.  No SE. Not depressed in a while.  But not sure the Capylta helps mania.   Lost from 230# to 189# with diet and exercise over a long period.   Sleep 4-8 hours, but not that I can't sleep but don't feel like sleeping.  Not itching any more.  I'm a a whole new person.   I can't keep up the mania.  Driving my parents and friends crazy. Blood sugar better and off insulin.  Only taking metformin. Can't afford Caplyta. So feels he must change. Hydroxyzine really helped anxiety. Plan: DC Caplyta DT mania.  switch to Latuda 40 then 80 mg with food.  Pick up samples.  12/03/2020 phone call from parents:Parents called and want dr. Jennelle Human to know that Cody Short is maniac and he is having physical aggression and all he does is play video games 24 hours a day and is not sleeping. Parents feel like Cody Short is not giving you the whole picture and needs an adjustment to his medicine. MD response:atient acknowledged that his appointment on January 24 that he was manic while taking Caplyta.  He also acknowledged that his family saw him as being manic.  We discontinued Caplyta and switched him to Jordan.  If he made the switch and increase the dose in a timely manner he should have been on 80 mg daily for a week now.  His parents are complaining that he is still manic.  Provided he is tolerating the Latuda reasonably well have him increase to 120 mg daily.  He can take 1-1/2 of the 80 mg tablets until he runs out and then we can send in a refill for 120 mg daily.  Remind him he needs to take that with at least 350 cal. Phone call with patient from nurse: Patient rtc and he did report he's doing great on the medication but is still having mania. Reports staying up all night playing video games then asleep about 5 am and back up at noon. He is taking medication with 350 calories also. Advised him to increase to 120  mg Latuda daily. He reports having only 40 mg tablets at home so instructed him to take 3 tablets and I would get more samples  out for him to pick up tomorrow. He agreed and verbalized understanding.  12/07/2020 phone call: Patient called again about his Latuda. He states that it makes him really hungry after he takes it, he takes a nap and wakes up hungry again. He would like to know would it be okay to take before bed instead. MD response: He is unlikely to comply with recommendations about routine sleep and wake times.  He is in a habit of playing video games late into the night.  If he prefers he can split the dosage of the Latuda between breakfast and another meal.  But it is very important that he get the full dose in every day and take it with 350 cal. Nursing phone call with patient:Rtc to patient and he reports he has to take it all at hs because it makes him sleepy, I advised him to continue that regimen. He also reports not sleeping well but he's been drinking caffeine later in the day. Advised him to not drink any caffeine past 2-3 pm. He agreed. He reports his blood sugars have been elevated some, not as bad but it is up some.   12/20/2020 appointment with the following noted: Mania tremendously better but occ anger outbursts anger.  6 hours of sleep with melatonin, hydroxyzine and Latuda 120 mg for 2 weeks.  Staying up all night playing video games. Kasandra Knudsen does make him hungry right after he takes it so takes it before bedtime.  Taking with food. Hyperactivity and loud talking is better.  I can function fast.  Parents see benefit with mania but are concerned about his anger outbursts but he things she takes stress out on him.  Blood sugar is generally good except when eats too much.  Taking it with dinner and HS.   Able to drive around town.  Can cook and clean.  Less ruminating on his past.  Less need for direction.   Counseling is helping at [facility] counseling. Hydroxyzine helped  itching and anxiety.  Plan: Continue Latuda 120 mg PM.  Is getting benefit but only been on it for a couple of weeks.   01/17/2021 appointment with following noted: So so.  Some anger outburts a couple of times.  Eats too much.  Blood sugar is pretty bad.  Up at night playing video games and then sleeps too late in the day.  Takes Latuda at evening meal.  Wants to blame meds for these behaviors. Otherwise mood is "good I guess".  No serious highs or lows.  Can get in arguments with mother when she tries to correct him and may feel guilty afterwards. Takes hydroxyzine q 6 hours because of anxiety and bc dx thyroid inflamed, thyroiditis.  Asks if there's anything to help with overeating. Lost weight from thyroid.    Works with [employer].  Prior psychiatric medications: risperidone with cognitive side effects, Abilify NR,  Zyprexa SE glucose, Seroquel 600 mg SE wt, perphenazineSE, Saphris 20 partial response but SE increase glucose , haloperidol EPS, Geodon EPS,  Vraylar, loxapine SE, Caplyta NR, Latuda 120 He's prone to EPS. Depakote (Note patient had severe pancreatitis from Depakote.) , Equetro, Lithium was stopped early April 2021 bc of diabetes insipidus.   N-acetylcysteine with minimal benefit, , Amantadine with vomiting.  Sertraline and lexapro sexual SE,   Propranolol for tremor, lithium 1500,     Review of Systems:  Review of Systems  Endocrine: Positive for polydipsia and polyuria.  Genitourinary: Negative for decreased urine volume and  frequency.  Skin:       Itching resolved with hydroxyzine  Neurological: Negative for tremors and weakness.  Psychiatric/Behavioral: Positive for behavioral problems and decreased concentration. Negative for agitation, confusion, dysphoric mood, hallucinations, self-injury, sleep disturbance and suicidal ideas. The patient is hyperactive.     Medications: I have reviewed the patient's current medications.  Current Outpatient Medications   Medication Sig Dispense Refill  . Blood Glucose Monitoring Suppl (FREESTYLE LITE) DEVI Use as instructed to check sugar 4 times daily 1 each 0  . fenofibrate 160 MG tablet Take 160 mg by mouth daily.    Marland Kitchen glucose blood (FREESTYLE LITE) test strip Use as instructed to check sugar 4 times daily 400 each 3  . hydrOXYzine (VISTARIL) 25 MG capsule TAKE 1 CAPSULE (25 MG TOTAL) BY MOUTH EVERY 6 (SIX) HOURS AS NEEDED. 100 capsule 1  . insulin aspart (NOVOLOG FLEXPEN) 100 UNIT/ML FlexPen Inject 25-35 Units into the skin 3 (three) times daily with meals. 60 mL 3  . Insulin Pen Needle (BD PEN NEEDLE NANO U/F) 32G X 4 MM MISC USE 4 TIMES DAILY WITH INSULIN PENS 400 each 3  . LANTUS SOLOSTAR 100 UNIT/ML Solostar Pen Inject 30 Units into the skin at bedtime. 32.4 mL 3  . Lurasidone HCl (LATUDA) 120 MG TABS Take 1 tablet (120 mg total) by mouth every evening. 30 tablet 1  . metFORMIN (GLUCOPHAGE-XR) 500 MG 24 hr tablet Take 4 tablets (2,000 mg total) by mouth daily with supper. 360 tablet 2  . OneTouch Delica Lancets 30G MISC 1 each by Does not apply route 4 (four) times daily. Use Onetouch Delica lancets to check blood sugar 4 times daily. DX:E11.65 400 each 11  . Rosuvastatin Calcium 5 MG CPSP Take by mouth.    . topiramate (TOPAMAX) 25 MG tablet Take 1 tablet (25 mg total) by mouth 2 (two) times daily. 60 tablet 1  . valsartan (DIOVAN) 160 MG tablet Take 160 mg by mouth daily.     No current facility-administered medications for this visit.    Medication Side Effects: Other: less tremor  Allergies:  Allergies  Allergen Reactions  . Divalproex Sodium Other (See Comments)    unknown  . Methylphenidate Hcl     Aggravate bipolar  . Oxycodone Hcl     REACTION: hallucinations  . Ozempic (0.25 Or 0.5 Mg-Dose) [Semaglutide(0.25 Or 0.5mg -Dos)] Nausea Only  . Paroxetine     Aggravate bipolar disorder    Past Medical History:  Diagnosis Date  . 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 Bipolar affective  disorder (HCC)   . DM w/o Complication Type II 07/05/2007  . HYPERTRIGLYCERIDEMIA 10/14/2010  . Nocturia 07/04/2010  . PILAR CYST 08/03/2007   Cyst in groin-states comes up when blood sugar gets high. Recurs if cannot walk for a week.     . TOBACCO ABUSE, HX OF 07/31/2009    Family History  Problem Relation Age of Onset  . Diabetes Mother   . Breast cancer Mother        was told not hereditary  . Cirrhosis Mother        fatty liver  . Diabetes Father   . Melanoma Maternal Uncle   . Colon cancer Neg Hx   . Stomach cancer Neg Hx   . Pancreatitis Neg Hx   . Heart disease Neg Hx   . Kidney disease Neg Hx   . Liver disease Neg Hx     Social History   Socioeconomic History  . Marital  status: Single    Spouse name: Not on file  . Number of children: Not on file  . Years of education: Not on file  . Highest education level: Not on file  Occupational History  . Not on file  Tobacco Use  . Smoking status: Former Smoker    Packs/day: 2.50    Years: 10.00    Pack years: 25.00    Types: Cigarettes    Quit date: 10/27/2006    Years since quitting: 14.2  . Smokeless tobacco: Never Used  Substance and Sexual Activity  . Alcohol use: No    Alcohol/week: 0.0 standard drinks    Comment: none at all  . Drug use: No  . Sexual activity: Not on file  Other Topics Concern  . Not on file  Social History Narrative   Single. Not dating currently. No kids.    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    Please see review of systems for further details on the patient's review from today.   Objective:   Physical Exam:  There were no vitals taken for this visit.  Physical Exam Neurological:     Mental Status: He is alert and oriented to person, place, and time.     Cranial Nerves: No dysarthria.  Psychiatric:        Attention and Perception: Attention normal.        Mood and Affect: Mood is not anxious, depressed or elated.        Speech: Speech is not rapid and pressured or slurred.        Behavior: Behavior is not agitated or hyperactive. Behavior is cooperative.        Thought Content: Thought content is not paranoid or delusional. Thought content does not include homicidal or suicidal ideation. Thought content does not include homicidal or suicidal plan.        Cognition and Memory: Cognition and memory normal.     Comments: No paranoia.  No sig anxiety.  Less manic Insight and judgment fair to poor. Chronically self-preoccupied and hyperthymic style a little better.  Less pressured and less manic but not totally gone.      Lab Review:     Component Value Date/Time   NA 140 06/18/2018 1502   K 4.5 06/18/2018 1502   CL 107 06/18/2018 1502   CO2 19 (L) 06/18/2018 1502   GLUCOSE 129 (H) 06/18/2018 1502   BUN 14 06/18/2018 1502   CREATININE 1.24 06/18/2018 1502   CALCIUM 10.3 06/18/2018 1502   PROT 8.3 (H) 06/18/2018 1502   ALBUMIN 4.4 02/10/2017 1631   AST 26 06/18/2018 1502   ALT 24 06/18/2018 1502   ALKPHOS 54 02/10/2017 1631   BILITOT 0.4 06/18/2018 1502   GFRNONAA 84 04/02/2018 1551   GFRAA 97 04/02/2018 1551       Component Value Date/Time   WBC 11.3 (H) 06/27/2016 1441   RBC 4.52 06/27/2016 1441  HGB 13.5 06/27/2016 1441   HCT 39.0 06/27/2016 1441   PLT 248.0 06/27/2016 1441   MCV 86.3 06/27/2016 1441   MCH 29.6 01/28/2014 1250   MCHC 34.5 06/27/2016 1441   RDW 13.2 06/27/2016 1441   LYMPHSABS 1.9 06/27/2016 1441   MONOABS 0.8 06/27/2016 1441    EOSABS 0.2 06/27/2016 1441   BASOSABS 0.1 06/27/2016 1441    Lithium Lvl  Date Value Ref Range Status  09/09/2019 1.1 0.6 - 1.2 mmol/L Final    12/14/19 lithium level 0.6 (trough 13 hours) , normal BMP and calcium   No results found for: PHENYTOIN, PHENOBARB, VALPROATE, CBMZ   .res Assessment: Plan:    Cody Short was seen today for follow-up, bipolar i disorder, most recent episode (or current) manic , schizoaffective disorder, bipolar type (hcc), manic behavior and eating disorder.  Diagnoses and all orders for this visit:  Bipolar I disorder, most recent episode (or current) manic (HCC)  Generalized anxiety disorder  Panic disorder with agoraphobia  Insomnia due to mental condition  Marijuana abuse in remission  Other orders -     topiramate (TOPAMAX) 25 MG tablet; Take 1 tablet (25 mg total) by mouth 2 (two) times daily.   Cody Short has had significant disruptive mood and psychotic symptoms and substance abuse over the course of his treatment here.  It has been evident in chronic occupational dysfunction and some relational problems.  He is also been very prone to EPS and elevated blood sugars from atypicals making it difficult to get adequate mood stabilization.   Has a history of severe pancreatitis from Depakote.  He did not have an adequate response to carbamazepine.   lithium was insufficient to control his symptoms in monotherapy and he developed diabetes insipidus.  There are no available mood stabilizers that do not have a warning of elevating blood sugar and cholesterol.  He has tried all the mood stabilizers that do not have this warning.  All of the available mood stabilizers left that have any effectiveness are antipsychotics and the FDA has required a class warning on all antipsychotics about blood sugar and cholesterol effects.  This is true despite the fact that not all antipsychotics are equal and increasing these risks.  The patient's reading of these potential side  effects is complicating finding an effective treatment.  The only way to prove whether or not these meds are elevating his blood sugar and cholesterol is to stop the medications for a period of time.  This exposes him to manic risk but there is no chance of finding an adequate mood stabilizer that will satisfy him until we can prove whether or not these meds are in fact affecting his blood sugar, cholesterol, blood pressure, and pulse.  In general he has not been able to afford branded medications which further limits options such as the use of Latuda which has a lower metabolic risk than other antipsychotics.  He is also EPS prone which further limits options.  He has recently been manic but that is improved on Latuda 120 mg over the last couple of months.   Continue Latuda 120 mg PM.  Is getting benefit.. Better tolerated than others so far but he does say it makes him hungry but he has chronic impulse control problems including overeating.  Discussed potential metabolic side effects associated with atypical antipsychotics, as well as potential risk for movement side effects. Advised pt to contact office if movement side effects occur.   Hydroxyzine 25 mg QID prn.  This has been helpful. This will help anxiety and itching and may have some mild antimanic effect.  He has a low stress tolerance.  He can be easily agitated in public.  He has continued abstinence from marijuana is encouraged.  He has a history of cannabinoid hyperemesis which finally convinced him to discontinue the marijuana.  His mood disorder and thought disorganization have improved since being off the marijuana.  He has a very long history of heavy marijuana dependence.   Continue monitoring blood sugar and following advice of endocrinologist.  Disc topiramate 25 mg BID off label for weight loss and disc SE.  He wants to try something.  Sleep hygiene.  I do not think meds are oversedating him.  Follow-up 4 weeks.   Meredith Staggers, MD, DFAPA  Please see After Visit Summary for patient specific instructions.  Future Appointments  Date Time Provider Department Center  01/18/2021  3:00 PM Carlus Pavlov, MD [id number] None  02/12/2021  2:00 PM Cottle, Steva Ready., MD [facility] None    No orders of the defined types were placed in this encounter.     -------------------------------

## 2021-01-18 ENCOUNTER — Ambulatory Visit: Payer: 59 | Admitting: Internal Medicine

## 2021-01-18 ENCOUNTER — Encounter: Payer: Self-pay | Admitting: Internal Medicine

## 2021-01-18 ENCOUNTER — Other Ambulatory Visit (INDEPENDENT_AMBULATORY_CARE_PROVIDER_SITE_OTHER): Payer: 59

## 2021-01-18 ENCOUNTER — Other Ambulatory Visit: Payer: Self-pay

## 2021-01-18 VITALS — BP 128/82 | HR 76 | Ht 69.0 in | Wt 213.4 lb

## 2021-01-18 DIAGNOSIS — E069 Thyroiditis, unspecified: Secondary | ICD-10-CM

## 2021-01-18 DIAGNOSIS — E0590 Thyrotoxicosis, unspecified without thyrotoxic crisis or storm: Secondary | ICD-10-CM | POA: Diagnosis not present

## 2021-01-18 DIAGNOSIS — E669 Obesity, unspecified: Secondary | ICD-10-CM

## 2021-01-18 DIAGNOSIS — E1165 Type 2 diabetes mellitus with hyperglycemia: Secondary | ICD-10-CM

## 2021-01-18 LAB — TSH: TSH: <0.01 u[IU]/mL — ABNORMAL LOW (ref 0.35–4.50)

## 2021-01-18 LAB — POCT GLYCOSYLATED HEMOGLOBIN (HGB A1C): Hemoglobin A1C: 7.3 % — AB (ref 4.0–5.6)

## 2021-01-18 LAB — T3, FREE: T3, Free: 3.5 pg/mL (ref 2.3–4.2)

## 2021-01-18 LAB — T4, FREE: Free T4: 1.13 ng/dL (ref 0.60–1.60)

## 2021-01-18 MED ORDER — FREESTYLE LITE TEST VI STRP
ORAL_STRIP | 3 refills | Status: DC
Start: 2021-01-18 — End: 2022-03-06

## 2021-01-18 NOTE — Patient Instructions (Addendum)
Please continue: - Metformin ER 2000 mg with dinner - Novolog before meals: 15-30 units 2x a day  Please increase: - Lantus 30 units at bedtime  Please stop at the lab.  Please return in 3 to 4 months with your sugar log.

## 2021-01-18 NOTE — Progress Notes (Signed)
Patient ID: Cody Short, male   DOB: 07/20/1976, 45 y.o.   MRN: [id number]  This visit occurred during the SARS-CoV-2 public health emergency.  Safety protocols were in place, including screening questions prior to the visit, additional usage of staff PPE, and extensive cleaning of exam room while observing appropriate contact time as indicated for disinfecting solutions.   HPI: Cody Short is a 45 y.o.-year-old male, presenting for follow-up for DM2, dx in ~2010, insulin-dependent, uncontrolled, without long term complications. Last visit 4 months ago.  Patient's diabetes is directly correlated which is bipolar disease treatment: In the past he had frequent urination resolved after stopping lithium.  While transitioning off lithium, his Saphris dose was increased the blood sugars started to increase afterwards.    He was then taken off Saphris and started on Loxitane.  This was making him sleepier, but was not increasing his blood sugars.  In fact, sugars started to improve and he was able to reduce his insulin doses.   She had to stop Loxitane 2/2 increased BP and pulse.  In summer 2021, he switched to Caplyta >> sugars improved significantly on this.  In fact, he tells me that he was able to come off insulin completely while on this.  However, this did not work for him -was having anger outbursts on it.   At this visit, his on Latuda >> feels better on this. However, on this, he was eating more >> sugars increased.  Also, he gained weight.  After last visit, she lost weight to 188 lbs but gained the weight back.  Yesterday, he started an appetite suppressant (Topamax).   Reviewed HbA1c levels: Lab Results  Component Value Date   HGBA1C 6.9 (A) 09/14/2020   HGBA1C 8.6 (A) 05/10/2020   HGBA1C 7.9 (A) 02/14/2020  12/22/2018: HbA1c 7.2%  Pt is on a regimen of: - Metformin ER 2000 mg at bedtime - Novolog before meals: 20-25 units 2-3x a day >> ... >> 0-15-15 units  >> 0 >> 15-30 units before L and D - Lantus 10-15 (25) >> 36 >> 15-25 >> 15 >> 25 >> 15 >> 0 >> 25 units at bedtime He could not tolerate Ozempic >> nausea, AP We had to stop Jardiance due to increased urination. He was previously on Lantus but stopped since sugars improved. He tried Glipizide >> hypoglycemia in the 40s repeatedly. Lowest: 25. Also, Glipizide 2.5 mg in am >> nausea, vomiting, constipation We tried Invokana >> bothersome urination (when he was working) >> had to stop. He had pancreatitis 2/2 Depakote and HTG in the past. He stopped Actos b/c stomach pain.  We stopped Cycloset >> inefficient, could not use b/c price.  Pt checks his sugars 4 times a day: - am:   98-247 >> 114-175 >> 94-149, 192 >> 95, 110-197 - 2h after b'fast:  n/c >> 247 >> n/c >> 100-166, 244 >> 101 - lunch:  n/c >> 219 >> 116, 166 >> 161, 253, 264 - 2h after lunch:  99-327 >> 71-132, 171 >> 140-262, 310 - before dinner:    230, 387 >> 149-259 >> 102-188 >> 124-237 - 2h after dinner:  160-326 >> 133--269 >> 87-134 >> 106-236 - bedtime:   131-220 >> 119-207 >> 80-160, 190 >> 107-180 - nighttime: 89-181 >> see above >> 134 >> 83, 95 Lowest sugar was 50s >> 75 >> 71 >> 69; he has hypoglycemia awareness in the 70s. Highest sugar was 399 >> 468 >> 244 >> 310.  Glucometer: FreeStyle  No history of CKD, last BUN/creatinine:  08/06/2020: 12/1.11 12/14/2019: 8/1.09, GFR 84, glucose 102 12/22/2018: 15/1.13, GFR 81, glucose 110 Lab Results  Component Value Date   BUN 14 06/18/2018   CREATININE 1.24 06/18/2018   He had MAU in the past, improved at last check: Lab Results  Component Value Date   MICRALBCREAT 35.2 (H) 05/10/2020   MICRALBCREAT 26.7 04/02/2018   MICRALBCREAT 3.7 02/10/2017   MICRALBCREAT 27.3 12/17/2015   MICRALBCREAT 4.0 02/22/2015  12/22/2018: 262 On Diovan.  He has mixed hyperlipidemia: Lab Results  Component Value Date   CHOL 187 08/06/2020   HDL 41 08/06/2020   LDLCALC 110 (H)  08/06/2020   LDLDIRECT 120.0 04/02/2018   TRIG 208 (H) 08/06/2020   CHOLHDL 4.6 08/06/2020  05/03/2020: 225/345/38/126 11/23/2019: 109/148/34/49 12/22/2018: 220/359/41/145 On fenofibrate 145.  - last eye exam was in 03/2017: No DR  -+ Numbness but no tingling in his feet  In 2010, he developed pancreatitis from Depakote.  He has a history of scrotal rash-improved on Nystatin + Triamcinolone.  He may need refills in the future. He has recurrent groin furunculosis.  In the last several months, he developed subclinical thyrotoxicosis: TSH levels were reviewed: Lab Results  Component Value Date   TSH <0.01 (L) 01/18/2021   TSH <0.01 (L) 12/11/2020   TSH 0.02 (L) 09/14/2020   TSH 4.03 12/17/2015   TSH 2.08 01/03/2014   TSH 5.15 11/20/2011   TSH 2.15 07/04/2010   TSH 3.82 12/12/2009   TSH 1.81 10/29/2007   FREET4 1.13 01/18/2021   FREET4 1.56 12/11/2020   FREET4 1.41 09/14/2020   T3FREE 3.5 01/18/2021   T3FREE 4.1 12/11/2020   T3FREE 3.9 09/14/2020  Previously: 08/06/2020: TSH 0.079 05/03/2020: TSH 0.528 12/22/2018: 3.648  We checked a thyroid uptake and scan (01/02/2021) and this was consistent with thyroiditis: The thyroid scan is unremarkable. No hot or cold thyroid nodules are identified. 4 hour I-123 uptake = 2.7% (normal 5-20%) 24 hour I-123 uptake = 5.4% (normal 10-30%)  IMPRESSION: Low 4 hour and 24 hour or I 123 uptake may suggest thyroiditis.  Pt denies: - feeling nodules in neck - hoarseness - dysphagia - choking - SOB with lying down  He denies hypothyroid symptoms.  ROS: Constitutional: + weight gain/+ weight loss, no fatigue, no subjective hyperthermia, no subjective hypothermia Eyes: no blurry vision, no xerophthalmia ENT: no sore throat, + see HPI Cardiovascular: no CP/no SOB/no palpitations/no leg swelling Respiratory: no cough/no SOB/no wheezing Gastrointestinal: no N/no V/no D/no C/no acid reflux Musculoskeletal: no muscle aches/no joint  aches Skin: no rashes, no hair loss Neurological: no tremors/no numbness/no tingling/no dizziness  I reviewed pt's medications, allergies, PMH, social hx, family hx, and changes were documented in the history of present illness. Otherwise, unchanged from my initial visit note.  Past Medical History:  Diagnosis Date  . 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 Bipolar affective disorder (HCC)   . DM w/o Complication Type II 07/05/2007  . HYPERTRIGLYCERIDEMIA 10/14/2010  . Nocturia 07/04/2010  . PILAR CYST 08/03/2007   Cyst in groin-states comes up when blood sugar gets high. Recurs if cannot walk for a week.     . TOBACCO ABUSE, HX OF 07/31/2009   Past Surgical History:  Procedure Laterality Date  . pilar cystectomy     History   Social History  . Marital Status: Single    Spouse Name: N/A  . Number of Children: 0   Occupational History  .    Social  History Main Topics  . Smoking status: Former Smoker - quit in 2010  . Smokeless tobacco: Never Used  . Alcohol Use: No     Comment: none at all  . Drug Use: No   Current Outpatient Medications  Medication Sig Dispense Refill  . Blood Glucose Monitoring Suppl (FREESTYLE LITE) DEVI Use as instructed to check sugar 4 times daily 1 each 0  . fenofibrate 160 MG tablet Take 160 mg by mouth daily.    Marland Kitchen glucose blood (FREESTYLE LITE) test strip Use as instructed to check sugar 4 times daily 400 each 3  . hydrOXYzine (VISTARIL) 25 MG capsule TAKE 1 CAPSULE (25 MG TOTAL) BY MOUTH EVERY 6 (SIX) HOURS AS NEEDED. 100 capsule 1  . insulin aspart (NOVOLOG FLEXPEN) 100 UNIT/ML FlexPen Inject 25-35 Units into the skin 3 (three) times daily with meals. 60 mL 3  . Insulin Pen Needle (BD PEN NEEDLE NANO U/F) 32G X 4 MM MISC USE 4 TIMES DAILY WITH INSULIN PENS 400 each 3  . LANTUS SOLOSTAR 100 UNIT/ML Solostar Pen Inject 30 Units into the skin at bedtime. 32.4 mL 3  . Lurasidone HCl (LATUDA) 120 MG TABS Take 1 tablet (120 mg total) by mouth every evening. 30  tablet 1  . metFORMIN (GLUCOPHAGE-XR) 500 MG 24 hr tablet Take 4 tablets (2,000 mg total) by mouth daily with supper. 360 tablet 2  . OneTouch Delica Lancets 30G MISC 1 each by Does not apply route 4 (four) times daily. Use Onetouch Delica lancets to check blood sugar 4 times daily. DX:E11.65 400 each 11  . Rosuvastatin Calcium 5 MG CPSP Take by mouth.    . topiramate (TOPAMAX) 25 MG tablet Take 1 tablet (25 mg total) by mouth 2 (two) times daily. 60 tablet 1  . valsartan (DIOVAN) 160 MG tablet Take 160 mg by mouth daily.     No current facility-administered medications for this visit.    No current facility-administered medications on file prior to visit.    Allergies  Allergen Reactions  . Divalproex Sodium Other (See Comments)    unknown  . Methylphenidate Hcl     Aggravate bipolar  . Oxycodone Hcl     REACTION: hallucinations  . Ozempic (0.25 Or 0.5 Mg-Dose) [Semaglutide(0.25 Or 0.5mg -Dos)] Nausea Only  . Paroxetine     Aggravate bipolar disorder   Family History  Problem Relation Age of Onset  . Diabetes Mother   . Breast cancer Mother        was told not hereditary  . Cirrhosis Mother        fatty liver  . Diabetes Father   . Melanoma Maternal Uncle   . Colon cancer Neg Hx   . Stomach cancer Neg Hx   . Pancreatitis Neg Hx   . Heart disease Neg Hx   . Kidney disease Neg Hx   . Liver disease Neg Hx    PE: BP 128/82 (BP Location: Right Arm, Patient Position: Sitting, Cuff Size: Normal)   Pulse 76   Ht  (1.753 m)   Wt 213 lb 6.4 oz (96.8 kg)   SpO2 96%   BMI 31.51 kg/m  Body mass index is 31.51 kg/m. Wt Readings from Last 3 Encounters:  01/18/21 213 lb 6.4 oz (96.8 kg)  09/14/20 213 lb 6.4 oz (96.8 kg)  05/10/20 218 lb (98.9 kg)   Constitutional: overweight, in NAD Eyes: PERRLA, EOMI, no exophthalmos ENT: moist mucous membranes, no thyromegaly, no cervical lymphadenopathy Cardiovascular: tachycardia, RR,  No MRG Respiratory: CTA B Gastrointestinal:  abdomen soft, NT, ND, BS+ Musculoskeletal: no deformities, strength intact in all 4 Skin: moist, warm, no rashes Neurological: no tremor with outstretched hands, DTR normal in all 4  ASSESSMENT: 1. DM2, insulin-dependent, uncontrolled, without long term complications, but with hyperglycemia  His test were negative for type 1 diabetes: Component     Latest Ref Rng 05/28/2015  C-Peptide     0.80 - 3.90 ng/mL 2.88  Glucose, Fasting     65 - 99 mg/dL 102 (H)  Glutamic Acid Decarb Ab     <5 IU/mL <5  Pancreatic Islet Cell Antibody     < 5 JDF Units <5   - His diabetes is difficult to manage because of multiple intolerances: - We tried to add Invokana but he did not tolerated due to increased urination.  - We cannot use a DPP 4 inhibitor or a GLP-1 receptor agonist due to his history of pancreatitis.  - He had to stop Actos >>  abd. Pain - we stopped Cycloset >> expensive and not effective - we tried Glipizide >> GI sxs: N/V/D - he tried the Dexcom G6 CGM but he felt that this was giving him a lot of errors and stopped  2. HTG  3.  Obesity class I  4.  Low TSH  PLAN:  1. Patient with longstanding uncontrolled, type 2 diabetes, with improved control of his blood sugars after he started to improve his diet, started exercising and coming off Saphris.  HbA1c improved to 6.9% at last visit. -Of note, he tried Ozempic but this caused nausea and abdominal pain so he had to stop. -At last visit, sugars were much better after switching to a new medication with his bipolar disease.  He was also feeling better and was on a higher protein diet but with fruits and vegetables.  He was working with a nutritionist. -At this visit, his sugars are very fluctuating, and overall tired in the last 2 weeks.  He started on Latuda and he feels that this is increasing his appetite.  Therefore, yesterday he started Topamax.  I explained that this can definitely help with decreased appetite, improving weight and  blood sugars. -She was off insulin before Latuda reached full effect, but then he had to restart Lantus and NovoLog.  At this visit, he is on 25 units of Lantus at night and I advised him to increase the dose to 30 units.  He is adjusting the NovoLog dose based on the size of his meals.  I would not recommend to change this for now especially as I expect Topamax to help with his appetite and subsequently blood sugars. - I advised him to: Patient Instructions  Please continue: - Metformin ER 2000 mg with dinner - Novolog before meals: 15-30 units 2x a day  Please increase: - Lantus 30 units at bedtime  Please stop at the lab.  Please return in 3 to 4 months with your sugar log.  - we checked his HbA1c: 7.3% (higher) - advised to check sugars at different times of the day - 3x a day, rotating check times - advised for yearly eye exams >> he is UTD - return to clinic in 3-4 months  2. HL -He has a history of hypertriglyceridemia -He had a repeat lipid panel on 05/03/2020 is triglycerides were higher than before, at 345 also, AST and ALT were elevated.  After this, he started on a diet.-Continues fenofibrate 145 mg daily without side  effects  3.  Obesity class I -Continues Metformin which is appetite suppressant long-term.  He is also preparing to start Topamax. -He lost 5 pounds before last visit, and lost a significant amount of weight afterwards but gained it back after starting Latuda. -Continue exercise and limiting fatty foods -Due to the history of pancreatitis, Ozempic is not first-line for him.  She tried this in the past but had GI symptoms on it.  4. Low TSH -No thyrotoxic signs -Patient with history of thyrotoxicosis but normal TSI's. -We checked a thyroid uptake and scan since last visit and this was consistent with thyroiditis -At today's visit, we discussed that this is usually self-limiting, and we will repeat his TFTs today.  Appointment on 01/18/2021  Component Date  Value Ref Range Status  . T3, Free 01/18/2021 3.5  2.3 - 4.2 pg/mL Final  . Free T4 01/18/2021 1.13  0.60 - 1.60 ng/dL Final   Comment: Specimens from patients who are undergoing biotin therapy and /or ingesting biotin supplements may contain high levels of biotin.  The higher biotin concentration in these specimens interferes with this Free T4 assay.  Specimens that contain high levels  of biotin may cause false high results for this Free T4 assay.  Please interpret results in light of the total clinical presentation of the patient.    Marland Kitchen. TSH 01/18/2021 <0.01* 0.35 - 4.50 uIU/mL Final  Office Visit on 01/18/2021  Component Date Value Ref Range Status  . Hemoglobin A1C 01/18/2021 7.3* 4.0 - 5.6 % Final   TSH remains suppressed, but free thyroid hormones are at goal.  For now, in the absence of thyrotoxic symptoms, we can just follow him expectantly and will check labs at next visit.  Cody Pavlovristina Jareth Pardee, MD PhD [facility]

## 2021-02-01 ENCOUNTER — Other Ambulatory Visit: Payer: Self-pay | Admitting: Psychiatry

## 2021-02-01 DIAGNOSIS — F411 Generalized anxiety disorder: Secondary | ICD-10-CM

## 2021-02-08 ENCOUNTER — Other Ambulatory Visit: Payer: Self-pay | Admitting: Psychiatry

## 2021-02-12 ENCOUNTER — Encounter: Payer: Self-pay | Admitting: Psychiatry

## 2021-02-12 ENCOUNTER — Telehealth (INDEPENDENT_AMBULATORY_CARE_PROVIDER_SITE_OTHER): Payer: 59 | Admitting: Psychiatry

## 2021-02-12 DIAGNOSIS — F5105 Insomnia due to other mental disorder: Secondary | ICD-10-CM | POA: Diagnosis not present

## 2021-02-12 DIAGNOSIS — F411 Generalized anxiety disorder: Secondary | ICD-10-CM | POA: Diagnosis not present

## 2021-02-12 DIAGNOSIS — F3110 Bipolar disorder, current episode manic without psychotic features, unspecified: Secondary | ICD-10-CM | POA: Diagnosis not present

## 2021-02-12 DIAGNOSIS — F5113 Hypersomnia due to other mental disorder: Secondary | ICD-10-CM

## 2021-02-12 DIAGNOSIS — F1211 Cannabis abuse, in remission: Secondary | ICD-10-CM

## 2021-02-12 DIAGNOSIS — F4001 Agoraphobia with panic disorder: Secondary | ICD-10-CM

## 2021-02-12 MED ORDER — TOPIRAMATE 25 MG PO TABS: 50.0000 mg | ORAL_TABLET | Freq: Two times a day (BID) | ORAL | 1 refills | Status: DC

## 2021-02-12 MED ORDER — LATUDA 120 MG PO TABS: 1.0000 | ORAL_TABLET | Freq: Every evening | ORAL | 1 refills | Status: DC

## 2021-02-12 MED ORDER — HYDROXYZINE PAMOATE 25 MG PO CAPS: 25.0000 mg | ORAL_CAPSULE | Freq: Four times a day (QID) | ORAL | 1 refills | Status: DC | PRN

## 2021-02-12 NOTE — Progress Notes (Signed)
Cody Short [id number] [birth year] 45 y.o.   Virtual Visit via Telephone Note  I connected with pt by telephone and verified that I am speaking with the correct person using two identifiers.   I discussed the limitations, risks, security and privacy concerns of performing an evaluation and management service by telephone and the availability of in person appointments. I also discussed with the patient that there may be a patient responsible charge related to this service. The patient expressed understanding and agreed to proceed.  I discussed the assessment and treatment plan with the patient. The patient was provided an opportunity to ask questions and all were answered. The patient agreed with the plan and demonstrated an understanding of the instructions.   The patient was advised to call back or seek an in-person evaluation if the symptoms worsen or if the condition fails to improve as anticipated.  I provided 30 minutes of non-face-to-face time during this encounter. The patient was located at home and the provider was located office.  Session started at 2 and ended 230pm.   Subjective:   Patient ID:  Cody Short is a 45 y.o. (DOB 22-Jan-1976) male.  Chief Complaint:  Chief Complaint  Patient presents with  . Follow-up  . Bipolar I disorder, most recent episode (or current) manic   . Manic Behavior     Cody Short presents to the office today for follow-up of several psychiatric diagnoses ...    seen August 2020.  For obsessive anxiety  Switched Lexapro 5 mg daily on May 18.  But he stated it made him hungry and so it was stopped in August.  He continued on Saphris 10 mg nightly, lithium 1200 mg daily, propranolol as needed tremor.  Lithium level was ordered.  He called Sept saying he was manic.  Had a lithium level 0.7 and so dose was increased to 1500 mg daily.    Last seen December 21, 2019.  He had some ongoing manic symptoms and Saphris  was increased to 15 mg nightly.  02/07/2020 appointment with the following noted: He has made multiple phone calls to the office since that date.  He decided he wanted to stop lithium because of urinary problems.  He was warned this could lead to mania but he wanted to pursue it anyway.  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  Cody Short had been having bedwetting episodes which she attributed to the lithium.  He has not consistently taken Saphris 15 mg at bedtime since the office visit in February. He completely stopped lithium January 30, 2020 U frequency is much better and less thirsty.  Bedwetting resolved.  Reduced BM to 1-2 times daily.  Gassy.  Glucose is high.  B is hospitalized with unknown disease.  Insulin adjusted. Mental status is best it's ever been.  Less mania.  Less obsessing.  No worsening mania. Is taking Saphris 15 mg HS> Plan no med changes.  04/10/2020 appointment with the following noted: No longer on lithium.  Still frequent urination and thirst DT DM poorly controlled despite meds.  Endo says little else to take bc history of pancreatitis.  Says he goes to urinate q 15 mins. Plan: Wean Saphris in crossover to loxapine 150 mg HS over 2 weeks bc he and endocrinologist believe it's likely Saphris is cause of the inability to get his glucose to acceptable levels.   06/07/20 appt with the following noted: He's called a couple of  times CO sleepiness and reduced loxapine to 50 mg HS. Started Vegan diet and BS is better 4 weeks ago.  Thinks glucose is better off the Saphris. Lost 6 #.   CO sleepiness and sleep 16 hours   He thinks it also drove up pulse and blood pressure. Asks about taking Saphris 10 and a second medication. Mood has been fine.   Plan: He wants to restart Saphris 10 mg HS and use a 2nd mood stabilizer. Start Saphris and reduce Loxapine  To 10 mg at night DT prior failure of 10 Mg Saphris with lithium.  06/27/2020 phone  call from patient: Pt called to report Loxapine & Saphris is causing his BP to be very high and Saphris is causing blood sugar to be high. Apt 10/18. Need changes before then. Call back @ [phone]  MD response: He had wanted to go back on Saphris and reduce loxapine which we did.  Tell him to stop the Saphris and increase loxapine to 20 mg nightly.  He did not tolerate 50 mg of loxapine very well. Nurse TC withpt: Spoke with Cody Fickleaul and he will stop the Saphris, but feels the increase in the Loxapine to 20 mg will increase his blood sugars more. Explained that with coming off the Saphris he needs to increase it a bit or he will not be stable with his moods. Advised him to stop the Saphris and would check with Dr. Jennelle Humanottle about any other recommendations.   07/17/20 appt with the following noted: Doing terrible.  Needs melatonin to sleep.  Claims loxapine is elevating BP, pulse, TG, cholesterol, glucose.  Claimed Saphris did it too.   Sees PCP at end of October.   Started Vegan diet about 6 weeks ago and exercising.   A/P: There are no available mood stabilizers that do not have a warning of elevating blood sugar and cholesterol.  He has tried all the mood stabilizers that do not have this warning.  All of the available mood stabilizers left that have any effectiveness are antipsychotics and the FDA has required a class warning on all antipsychotics about blood sugar and cholesterol effects.  This is true despite the fact that not all antipsychotics are equal at increasing these risks.  The patient's reading of these potential side effects is complicating finding an effective treatment.  The only way to prove whether or not these meds are elevating his blood sugar and cholesterol is to stop the medications for a period of time.  This exposes him to manic risk but there is no chance of finding an adequate mood stabilizer that will satisfy him until we can prove whether or not these meds are in fact affecting  his blood sugar, cholesterol, blood pressure, and pulse. Therefore he will stop the loxapine which is at a low dose. We will check labs in 3 weeks.  We will attempt follow-up in 4 weeks.  If he gets manic he will let us know.   08/13/2020 appointment with the following noted: Blood sugar is pretty much the same off the loxapine but BP and pulse improved from 110 to 70 range.  But had started BP med about mid September. Started counselor Illene Labrad[facility]Byron Salmack [facility] and started nutritionsist.  Has changed and restricted diet. Sees PCP 07/24/20.  Endo is managing DM.   Mood has been really good.  Counseling helped the anxiety.  Getting 10 hours of sleep.  PCP suggested changing time going to bed earlier and it's helping.  Sometimes has racing thoughts and gets comments from others while playing video games that he needs to take a break. Patient reports stable mood and deniesr irritable moods.  Denies appetite disturbance.  Patient reports that energy and motivation have been good.  Patient denies any difficulty with concentration.  Patient denies any suicidal ideation.  Admits he's easily stressed.   10/09/20 appt with following noted: Getting manic as hell and depressed as hell. Talking too much and obsessing over things.  Talking too loud and doing things extremely fast.  Only depressed a couple of days ago.  Manic sx are brief and not all the time. Sleep 7-8 hours and sleep schedule is more normal. Stayed on Caplyta and pleased overall with SE. BS is better but hypertension is ongoing. Low TSH. Still counseling. No delusions hallucinations since off drugs. Give samples of Caplyta 42 mg once daily. Prefer no change befor ethe holidays bc he is not continuously manic or depressed. Likely switch to JordanLatuda after the holidays.  11/19/2020 appointment with the following noted: Best I've ever been except some controllable manic.  Able to do things fast and easily.  Anxiety  medicine and counseling has helped.  Better function.  No SE. Not depressed in a while.  But not sure the Capylta helps mania.   Lost from 230# to 189# with diet and exercise over a long period.   Sleep 4-8 hours, but not that I can't sleep but don't feel like sleeping.  Not itching any more.  I'm a a whole new person.   I can't keep up the mania.  Driving my parents and friends crazy. Blood sugar better and off insulin.  Only taking metformin. Can't afford Caplyta. So feels he must change. Hydroxyzine really helped anxiety. Plan: DC Caplyta DT mania.  switch to Latuda 40 then 80 mg with food.  Pick up samples.  12/03/2020 phone call from parents:Parents called and want dr. Jennelle Humancottle to know that Cody Ficklepaul is maniac and he is having physical aggression and all he does is play video games 24 hours a day and is not sleeping. Parents feel like Cody Fickleaul is not giving you the whole picture and needs an adjustment to his medicine. MD response:atient acknowledged that his appointment on January 24 that he was manic while taking Caplyta.  He also acknowledged that his family saw him as being manic.  We discontinued Caplyta and switched him to JordanLatuda.  If he made the switch and increase the dose in a timely manner he should have been on 80 mg daily for a week now.  His parents are complaining that he is still manic.  Provided he is tolerating the Latuda reasonably well have him increase to 120 mg daily.  He can take 1-1/2 of the 80 mg tablets until he runs out and then we can send in a refill for 120 mg daily.  Remind him he needs to take that with at least 350 cal. Phone call with patient from nurse: Patient rtc and he did report he's doing great on the medication but is still having mania. Reports staying up all night playing video games then asleep about 5 am and back up at noon. He is taking medication with 350 calories also. Advised him to increase to 120 mg Latuda daily. He reports having only 40 mg tablets at home so  instructed him to take 3 tablets and I would get more samples out for him to pick up tomorrow. He agreed and verbalized understanding.  12/07/2020 phone call: Patient called again about his Latuda. He states that it makes him really hungry after he takes it, he takes a nap and wakes up hungry again. He would like to know would it be okay to take before bed instead. MD response: He is unlikely to comply with recommendations about routine sleep and wake times.  He is in a habit of playing video games late into the night.  If he prefers he can split the dosage of the Latuda between breakfast and another meal.  But it is very important that he get the full dose in every day and take it with 350 cal. Nursing phone call with patient:Rtc to patient and he reports he has to take it all at hs because it makes him sleepy, I advised him to continue that regimen. He also reports not sleeping well but he's been drinking caffeine later in the day. Advised him to not drink any caffeine past 2-3 pm. He agreed. He reports his blood sugars have been elevated some, not as bad but it is up some.   12/20/2020 appointment with the following noted: Mania tremendously better but occ anger outbursts anger.  6 hours of sleep with melatonin, hydroxyzine and Latuda 120 mg for 2 weeks.  Staying up all night playing video games. Kasandra Knudsen does make him hungry right after he takes it so takes it before bedtime.  Taking with food. Hyperactivity and loud talking is better.  I can function fast.  Parents see benefit with mania but are concerned about his anger outbursts but he things she takes stress out on him.  Blood sugar is generally good except when eats too much.  Taking it with dinner and HS.   Able to drive around town.  Can cook and clean.  Less ruminating on his past.  Less need for direction.   Counseling is helping at [facility] counseling. Hydroxyzine helped itching and anxiety.  Plan: Continue Latuda 120 mg PM.  Is getting  benefit but only been on it for a couple of weeks.   01/17/2021 appointment with following noted: So so.  Some anger outburts a couple of times.  Eats too much.  Blood sugar is pretty bad.  Up at night playing video games and then sleeps too late in the day.  Takes Latuda at evening meal.  Wants to blame meds for these behaviors. Otherwise mood is "good I guess".  No serious highs or lows.  Can get in arguments with mother when she tries to correct him and may feel guilty afterwards. Takes hydroxyzine q 6 hours because of anxiety and bc dx thyroid inflamed, thyroiditis.  Asks if there's anything to help with overeating. Lost weight from thyroid.   Plan: Continue Latuda 120 mg as he is tolerating it and it appears to be controlling mania better than did the Caplyta and he is tolerating it better.  02/12/2021 appointment with the following noted: Perfectly good with mood and depression.  Needs hydroxyzine for itching and anxiety.  Energy poor with thyroiditis. Will sleep too much too bc gets bored and no physical activity. Varies 9-16 hours daily.  Can nap easily.  Not spacy.   No anger lately. Tolerating Latuda well.   Still eats too many sweets late at night and gets high blood sugars.    Works with [employer].  Prior psychiatric medications: risperidone with cognitive side effects, Abilify NR,  Zyprexa SE glucose, Seroquel 600 mg SE wt, perphenazineSE, Saphris 20 partial response but SE  increase glucose , haloperidol EPS, Geodon EPS,  Vraylar, loxapine SE, Caplyta NR, Latuda 120 He's prone to EPS. Depakote (Note patient had severe pancreatitis from Depakote.) , Equetro, Lithium was stopped early April 2021 bc of diabetes insipidus.   N-acetylcysteine with minimal benefit, , Amantadine with vomiting.  Sertraline and lexapro sexual SE,   Propranolol for tremor, lithium 1500,     Review of Systems:  Review of Systems  Endocrine: Positive for polydipsia and polyuria.  Genitourinary:  Negative for decreased urine volume and frequency.  Musculoskeletal: Positive for myalgias.  Skin:       Itching resolved with hydroxyzine  Neurological: Negative for tremors and weakness.  Psychiatric/Behavioral: Positive for decreased concentration. Negative for agitation, behavioral problems, confusion, dysphoric mood, hallucinations, self-injury, sleep disturbance and suicidal ideas. The patient is hyperactive.     Medications: I have reviewed the patient's current medications.  Current Outpatient Medications  Medication Sig Dispense Refill  . Blood Glucose Monitoring Suppl (FREESTYLE LITE) DEVI Use as instructed to check sugar 4 times daily 1 each 0  . fenofibrate 160 MG tablet Take 160 mg by mouth daily.    Marland Kitchen glucose blood (FREESTYLE LITE) test strip Use as instructed to check sugar 4 times daily 400 each 3  . insulin aspart (NOVOLOG FLEXPEN) 100 UNIT/ML FlexPen Inject 25-35 Units into the skin 3 (three) times daily with meals. 60 mL 3  . Insulin Pen Needle (BD PEN NEEDLE NANO U/F) 32G X 4 MM MISC USE 4 TIMES DAILY WITH INSULIN PENS 400 each 3  . LANTUS SOLOSTAR 100 UNIT/ML Solostar Pen Inject 30 Units into the skin at bedtime. 32.4 mL 3  . metFORMIN (GLUCOPHAGE-XR) 500 MG 24 hr tablet Take 4 tablets (2,000 mg total) by mouth daily with supper. 360 tablet 2  . OneTouch Delica Lancets 30G MISC 1 each by Does not apply route 4 (four) times daily. Use Onetouch Delica lancets to check blood sugar 4 times daily. DX:E11.65 400 each 11  . Rosuvastatin Calcium 5 MG CPSP Take by mouth.    . valsartan (DIOVAN) 160 MG tablet Take 160 mg by mouth daily.    . hydrOXYzine (VISTARIL) 25 MG capsule Take 1 capsule (25 mg total) by mouth every 6 (six) hours as needed. 120 capsule 1  . Lurasidone HCl (LATUDA) 120 MG TABS Take 1 tablet (120 mg total) by mouth every evening. 30 tablet 1  . topiramate (TOPAMAX) 25 MG tablet Take 2 tablets (50 mg total) by mouth 2 (two) times daily. 120 tablet 1   No  current facility-administered medications for this visit.    Medication Side Effects: Other: less tremor  Allergies:  Allergies  Allergen Reactions  . Divalproex Sodium Other (See Comments)    unknown  . Methylphenidate Hcl     Aggravate bipolar  . Oxycodone Hcl     REACTION: hallucinations  . Ozempic (0.25 Or 0.5 Mg-Dose) [Semaglutide(0.25 Or 0.5mg -Dos)] Nausea Only  . Paroxetine     Aggravate bipolar disorder    Past Medical History:  Diagnosis Date  . 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 Bipolar affective disorder (HCC)   . DM w/o Complication Type II 07/05/2007  . HYPERTRIGLYCERIDEMIA 10/14/2010  . Nocturia 07/04/2010  . PILAR CYST 08/03/2007   Cyst in groin-states comes up when blood sugar gets high. Recurs if cannot walk for a week.     . TOBACCO ABUSE, HX OF 07/31/2009    Family History  Problem Relation Age of Onset  . Diabetes Mother   .  Breast cancer Mother        was told not hereditary  . Cirrhosis Mother        fatty liver  . Diabetes Father   . Melanoma Maternal Uncle   . Colon cancer Neg Hx   . Stomach cancer Neg Hx   . Pancreatitis Neg Hx   . Heart disease Neg Hx   . Kidney disease Neg Hx   . Liver disease Neg Hx     Social History   Socioeconomic History  . Marital status: Single    Spouse name: Not on file  . Number of children: Not on file  . Years of education: Not on file  . Highest education level: Not on file  Occupational History  . Not on file  Tobacco Use  . Smoking status: Former Smoker    Packs/day: 2.50    Years: 10.00    Pack years: 25.00    Types: Cigarettes    Quit date: 10/27/2006    Years since quitting: 14.3  . Smokeless tobacco: Never Used  Substance and Sexual Activity  . Alcohol use: No    Alcohol/week: 0.0 standard drinks    Comment: none at all  . Drug use: No  . Sexual activity: Not on file  Other Topics Concern  . Not on file  Social History Narrative   Single. Not dating currently. No kids.    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   Please see review of systems for further details on the patient's review from today.   Objective:   Physical Exam:  There were no vitals taken for this visit.  Physical Exam Neurological:     Mental Status: He is alert and oriented to person, place, and time.     Cranial Nerves: No dysarthria.  Psychiatric:        Attention and Perception: Attention normal.        Mood and Affect: Mood is anxious. Mood is not depressed or elated.        Speech: Speech is not rapid and pressured or slurred.        Behavior: Behavior is not agitated or hyperactive. Behavior is cooperative.        Thought Content: Thought content is not paranoid or delusional. Thought content does not include homicidal or suicidal ideation. Thought content does not include homicidal or suicidal plan.        Cognition and Memory: Cognition and memory normal.     Comments: No paranoia.  No sig anxiety.  Less manic Insight and judgment fair to poor. Chronically self-preoccupied and hyperthymic style a little better.  Less pressured and less manic but not totally gone.      Lab Review:     Component Value Date/Time   NA 140 06/18/2018 1502   K 4.5 06/18/2018 1502   CL 107 06/18/2018 1502   CO2 19 (L) 06/18/2018 1502   GLUCOSE 129 (H)  06/18/2018 1502   BUN 14 06/18/2018 1502   CREATININE 1.24 06/18/2018 1502   CALCIUM 10.3 06/18/2018 1502   PROT 8.3 (H) 06/18/2018 1502   ALBUMIN 4.4 02/10/2017 1631   AST  26 06/18/2018 1502   ALT 24 06/18/2018 1502   ALKPHOS 54 02/10/2017 1631   BILITOT 0.4 06/18/2018 1502   GFRNONAA 84 04/02/2018 1551   GFRAA 97 04/02/2018 1551       Component Value Date/Time   WBC 11.3 (H) 06/27/2016 1441   RBC 4.52 06/27/2016 1441   HGB 13.5 06/27/2016 1441   HCT 39.0 06/27/2016 1441   PLT 248.0 06/27/2016 1441   MCV 86.3 06/27/2016 1441   MCH 29.6 01/28/2014 1250   MCHC 34.5 06/27/2016 1441   RDW 13.2 06/27/2016 1441   LYMPHSABS 1.9 06/27/2016 1441   MONOABS 0.8 06/27/2016 1441   EOSABS 0.2 06/27/2016 1441   BASOSABS 0.1 06/27/2016 1441    Lithium Lvl  Date Value Ref Range Status  09/09/2019 1.1 0.6 - 1.2 mmol/L Final    12/14/19 lithium level 0.6 (trough 13 hours) , normal BMP and calcium   No results found for: PHENYTOIN, PHENOBARB, VALPROATE, CBMZ   .res Assessment: Plan:    Mandel was seen today for follow-up, bipolar i disorder, most recent episode (or current) manic  and manic behavior.  Diagnoses and all orders for this visit:  Bipolar I disorder, most recent episode (or current) manic (HCC) -     Lurasidone HCl (LATUDA) 120 MG TABS; Take 1 tablet (120 mg total) by mouth every evening.  Generalized anxiety disorder -     hydrOXYzine (VISTARIL) 25 MG capsule; Take 1 capsule (25 mg total) by mouth every 6 (six) hours as needed. -     topiramate (TOPAMAX) 25 MG tablet; Take 2 tablets (50 mg total) by mouth 2 (two) times daily.  Panic disorder with agoraphobia -     topiramate (TOPAMAX) 25 MG tablet; Take 2 tablets (50 mg total) by mouth 2 (two) times daily.  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  It has been evident in chronic occupational dysfunction and some relational problems.  He is also been very prone to EPS and elevated blood sugars from  atypicals making it difficult to get adequate mood stabilization.   Has a history of severe pancreatitis from Depakote.  He did not have an adequate response to carbamazepine.   lithium was insufficient to control his symptoms in monotherapy and he developed diabetes insipidus.  There are no available mood stabilizers that do not have a warning of elevating blood sugar and cholesterol.  He has tried all the mood stabilizers that do not have this warning.  All of the available mood stabilizers left that have any effectiveness are antipsychotics and the FDA has required a class warning on all antipsychotics about blood sugar and cholesterol effects.  This is true despite the fact that not all antipsychotics are equal and increasing these risks.  The patient's reading of these potential side effects is complicating finding an effective treatment.  The only way to prove whether or not these meds are elevating his blood sugar and cholesterol is to stop the medications for a period of time.  This exposes him to manic risk but there is no chance of finding an adequate mood stabilizer  that will satisfy him until we can prove whether or not these meds are in fact affecting his blood sugar, cholesterol, blood pressure, and pulse.  In general he has not been able to afford branded medications which further limits options such as the use of Latuda which has a lower metabolic risk than other antipsychotics.  He is also EPS prone which further limits options.  He has recently been manic but that is improved on Latuda 120 mg over the last couple of months.   Continue Latuda 120 mg PM.  Is getting benefit.. Better tolerated than others so far but he does say it makes him hungry but he has chronic impulse control problems including overeating.  Discussed potential metabolic side effects associated with atypical antipsychotics, as well as potential risk for movement side effects. Advised pt to contact office if movement  side effects occur.   Hydroxyzine 25 mg QID prn.  This has been helpful. This will help anxiety and itching and may have some mild antimanic effect.  He has a low stress tolerance.  He can be easily agitated in public.  He has continued abstinence from marijuana is encouraged.  He has a history of cannabinoid hyperemesis which finally convinced him to discontinue the marijuana.  His mood disorder and thought disorganization have improved since being off the marijuana.  He has a very long history of heavy marijuana dependence.   Continue monitoring blood sugar and following advice of endocrinologist.  Increase  topiramate 50 mg BID off label for weight loss and disc SE.  He wants to try something.  Sleep hygiene.  I do not think meds are oversedating him.  Follow-up 4 weeks.   Meredith Staggers, MD, DFAPA  Please see After Visit Summary for patient specific instructions.  Future Appointments  Date Time Provider Department Center  03/20/2021  2:00 PM Cottle, Steva Ready., MD [facility] None  05/14/2021  3:40 PM Carlus Pavlov, MD [facility] None    No orders of the defined types were placed in this encounter.     -------------------------------

## 2021-03-09 ENCOUNTER — Other Ambulatory Visit: Payer: Self-pay | Admitting: Psychiatry

## 2021-03-09 DIAGNOSIS — F4001 Agoraphobia with panic disorder: Secondary | ICD-10-CM

## 2021-03-09 DIAGNOSIS — F411 Generalized anxiety disorder: Secondary | ICD-10-CM

## 2021-03-10 ENCOUNTER — Other Ambulatory Visit: Payer: Self-pay | Admitting: Psychiatry

## 2021-03-10 DIAGNOSIS — F411 Generalized anxiety disorder: Secondary | ICD-10-CM

## 2021-03-20 ENCOUNTER — Telehealth (INDEPENDENT_AMBULATORY_CARE_PROVIDER_SITE_OTHER): Payer: 59 | Admitting: Psychiatry

## 2021-03-20 ENCOUNTER — Encounter: Payer: Self-pay | Admitting: Psychiatry

## 2021-03-20 DIAGNOSIS — F411 Generalized anxiety disorder: Secondary | ICD-10-CM

## 2021-03-20 DIAGNOSIS — F3110 Bipolar disorder, current episode manic without psychotic features, unspecified: Secondary | ICD-10-CM

## 2021-03-20 DIAGNOSIS — F1211 Cannabis abuse, in remission: Secondary | ICD-10-CM

## 2021-03-20 DIAGNOSIS — F5105 Insomnia due to other mental disorder: Secondary | ICD-10-CM

## 2021-03-20 DIAGNOSIS — F4001 Agoraphobia with panic disorder: Secondary | ICD-10-CM

## 2021-03-20 DIAGNOSIS — F5113 Hypersomnia due to other mental disorder: Secondary | ICD-10-CM

## 2021-03-20 MED ORDER — LATUDA 120 MG PO TABS: 1.0000 | ORAL_TABLET | Freq: Every evening | ORAL | 0 refills | Status: DC

## 2021-03-20 MED ORDER — TOPIRAMATE 50 MG PO TABS: 50.0000 mg | ORAL_TABLET | Freq: Two times a day (BID) | ORAL | 0 refills | Status: DC

## 2021-03-20 NOTE — Progress Notes (Signed)
Cody Short [id number] [birth year] 45 y.o.   Virtual Visit via Telephone Note  I connected with pt by telephone and verified that I am speaking with the correct person using two identifiers.   I discussed the limitations, risks, security and privacy concerns of performing an evaluation and management service by telephone and the availability of in person appointments. I also discussed with the patient that there may be a patient responsible charge related to this service. The patient expressed understanding and agreed to proceed.  I discussed the assessment and treatment plan with the patient. The patient was provided an opportunity to ask questions and all were answered. The patient agreed with the plan and demonstrated an understanding of the instructions.   The patient was advised to call back or seek an in-person evaluation if the symptoms worsen or if the condition fails to improve as anticipated.  I provided 30 minutes of non-face-to-face time during this encounter. The patient was located at home and the provider was located office.  Session started at 2 and ended 230pm.   Subjective:   Patient ID:  BENNET KUJAWA is a 45 y.o. (DOB 22-Jan-1976) male.  Chief Complaint:  Chief Complaint  Patient presents with  . Follow-up  . Bipolar I disorder, most recent episode (or current) manic   . Manic Behavior     [name] presents to the office today for follow-up of several psychiatric diagnoses ...    seen August 2020.  For obsessive anxiety  Switched Lexapro 5 mg daily on May 18.  But he stated it made him hungry and so it was stopped in August.  He continued on Saphris 10 mg nightly, lithium 1200 mg daily, propranolol as needed tremor.  Lithium level was ordered.  He called Sept saying he was manic.  Had a lithium level 0.7 and so dose was increased to 1500 mg daily.    Last seen December 21, 2019.  He had some ongoing manic symptoms and Saphris  was increased to 15 mg nightly.  02/07/2020 appointment with the following noted: He has made multiple phone calls to the office since that date.  He decided he wanted to stop lithium because of urinary problems.  He was warned this could lead to mania but he wanted to pursue it anyway.  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  [name] had been having bedwetting episodes which she attributed to the lithium.  He has not consistently taken Saphris 15 mg at bedtime since the office visit in February. He completely stopped lithium January 30, 2020 U frequency is much better and less thirsty.  Bedwetting resolved.  Reduced BM to 1-2 times daily.  Gassy.  Glucose is high.  B is hospitalized with unknown disease.  Insulin adjusted. Mental status is best it's ever been.  Less mania.  Less obsessing.  No worsening mania. Is taking Saphris 15 mg HS> Plan no med changes.  04/10/2020 appointment with the following noted: No longer on lithium.  Still frequent urination and thirst DT DM poorly controlled despite meds.  Endo says little else to take bc history of pancreatitis.  Says he goes to urinate q 15 mins. Plan: Wean Saphris in crossover to loxapine 150 mg HS over 2 weeks bc he and endocrinologist believe it's likely Saphris is cause of the inability to get his glucose to acceptable levels.   06/07/20 appt with the following noted: He's called a couple of  times CO sleepiness and reduced loxapine to 50 mg HS. Started Vegan diet and BS is better 4 weeks ago.  Thinks glucose is better off the Saphris. Lost 6 #.   CO sleepiness and sleep 16 hours   He thinks it also drove up pulse and blood pressure. Asks about taking Saphris 10 and a second medication. Mood has been fine.   Plan: He wants to restart Saphris 10 mg HS and use a 2nd mood stabilizer. Start Saphris and reduce Loxapine  To 10 mg at night DT prior failure of 10 Mg Saphris with lithium.  06/27/2020 phone  call from patient: Pt called to report Loxapine & Saphris is causing his BP to be very high and Saphris is causing blood sugar to be high. Apt 10/18. Need changes before then. Call back @ [phone]  MD response: He had wanted to go back on Saphris and reduce loxapine which we did.  Tell him to stop the Saphris and increase loxapine to 20 mg nightly.  He did not tolerate 50 mg of loxapine very well. Nurse TC withpt: Spoke with [name] and he will stop the Saphris, but feels the increase in the Loxapine to 20 mg will increase his blood sugars more. Explained that with coming off the Saphris he needs to increase it a bit or he will not be stable with his moods. Advised him to stop the Saphris and would check with Dr. Jennelle Humanottle about any other recommendations.   07/17/20 appt with the following noted: Doing terrible.  Needs melatonin to sleep.  Claims loxapine is elevating BP, pulse, TG, cholesterol, glucose.  Claimed Saphris did it too.   Sees PCP at end of October.   Started Vegan diet about 6 weeks ago and exercising.   A/P: There are no available mood stabilizers that do not have a warning of elevating blood sugar and cholesterol.  He has tried all the mood stabilizers that do not have this warning.  All of the available mood stabilizers left that have any effectiveness are antipsychotics and the FDA has required a class warning on all antipsychotics about blood sugar and cholesterol effects.  This is true despite the fact that not all antipsychotics are equal at increasing these risks.  The patient's reading of these potential side effects is complicating finding an effective treatment.  The only way to prove whether or not these meds are elevating his blood sugar and cholesterol is to stop the medications for a period of time.  This exposes him to manic risk but there is no chance of finding an adequate mood stabilizer that will satisfy him until we can prove whether or not these meds are in fact affecting  his blood sugar, cholesterol, blood pressure, and pulse. Therefore he will stop the loxapine which is at a low dose. We will check labs in 3 weeks.  We will attempt follow-up in 4 weeks.  If he gets manic he will let us know.   08/13/2020 appointment with the following noted: Blood sugar is pretty much the same off the loxapine but BP and pulse improved from 110 to 70 range.  But had started BP med about mid September. Started counselor Illene Labrad[facility]Byron Salmack [facility] and started nutritionsist.  Has changed and restricted diet. Sees PCP 07/24/20.  Endo is managing DM.   Mood has been really good.  Counseling helped the anxiety.  Getting 10 hours of sleep.  PCP suggested changing time going to bed earlier and it's helping.  Sometimes has racing thoughts and gets comments from others while playing video games that he needs to take a break. Patient reports stable mood and deniesr irritable moods.  Denies appetite disturbance.  Patient reports that energy and motivation have been good.  Patient denies any difficulty with concentration.  Patient denies any suicidal ideation.  Admits he's easily stressed.   10/09/20 appt with following noted: Getting manic as hell and depressed as hell. Talking too much and obsessing over things.  Talking too loud and doing things extremely fast.  Only depressed a couple of days ago.  Manic sx are brief and not all the time. Sleep 7-8 hours and sleep schedule is more normal. Stayed on Caplyta and pleased overall with SE. BS is better but hypertension is ongoing. Low TSH. Still counseling. No delusions hallucinations since off drugs. Give samples of Caplyta 42 mg once daily. Prefer no change befor ethe holidays bc he is not continuously manic or depressed. Likely switch to JordanLatuda after the holidays.  11/19/2020 appointment with the following noted: Best I've ever been except some controllable manic.  Able to do things fast and easily.  Anxiety  medicine and counseling has helped.  Better function.  No SE. Not depressed in a while.  But not sure the Capylta helps mania.   Lost from 230# to 189# with diet and exercise over a long period.   Sleep 4-8 hours, but not that I can't sleep but don't feel like sleeping.  Not itching any more.  I'm a a whole new person.   I can't keep up the mania.  Driving my parents and friends crazy. Blood sugar better and off insulin.  Only taking metformin. Can't afford Caplyta. So feels he must change. Hydroxyzine really helped anxiety. Plan: DC Caplyta DT mania.  switch to Latuda 40 then 80 mg with food.  Pick up samples.  12/03/2020 phone call from parents:Parents called and want dr. Jennelle Humancottle to know that Renae Ficklepaul is maniac and he is having physical aggression and all he does is play video games 24 hours a day and is not sleeping. Parents feel like [name] is not giving you the whole picture and needs an adjustment to his medicine. MD response:atient acknowledged that his appointment on January 24 that he was manic while taking Caplyta.  He also acknowledged that his family saw him as being manic.  We discontinued Caplyta and switched him to JordanLatuda.  If he made the switch and increase the dose in a timely manner he should have been on 80 mg daily for a week now.  His parents are complaining that he is still manic.  Provided he is tolerating the Latuda reasonably well have him increase to 120 mg daily.  He can take 1-1/2 of the 80 mg tablets until he runs out and then we can send in a refill for 120 mg daily.  Remind him he needs to take that with at least 350 cal. Phone call with patient from nurse: Patient rtc and he did report he's doing great on the medication but is still having mania. Reports staying up all night playing video games then asleep about 5 am and back up at noon. He is taking medication with 350 calories also. Advised him to increase to 120 mg Latuda daily. He reports having only 40 mg tablets at home so  instructed him to take 3 tablets and I would get more samples out for him to pick up tomorrow. He agreed and verbalized understanding.  12/07/2020 phone call: Patient called again about his Latuda. He states that it makes him really hungry after he takes it, he takes a nap and wakes up hungry again. He would like to know would it be okay to take before bed instead. MD response: He is unlikely to comply with recommendations about routine sleep and wake times.  He is in a habit of playing video games late into the night.  If he prefers he can split the dosage of the Latuda between breakfast and another meal.  But it is very important that he get the full dose in every day and take it with 350 cal. Nursing phone call with patient:Rtc to patient and he reports he has to take it all at hs because it makes him sleepy, I advised him to continue that regimen. He also reports not sleeping well but he's been drinking caffeine later in the day. Advised him to not drink any caffeine past 2-3 pm. He agreed. He reports his blood sugars have been elevated some, not as bad but it is up some.   12/20/2020 appointment with the following noted: Mania tremendously better but occ anger outbursts anger.  6 hours of sleep with melatonin, hydroxyzine and Latuda 120 mg for 2 weeks.  Staying up all night playing video games. Kasandra Knudsen does make him hungry right after he takes it so takes it before bedtime.  Taking with food. Hyperactivity and loud talking is better.  I can function fast.  Parents see benefit with mania but are concerned about his anger outbursts but he things she takes stress out on him.  Blood sugar is generally good except when eats too much.  Taking it with dinner and HS.   Able to drive around town.  Can cook and clean.  Less ruminating on his past.  Less need for direction.   Counseling is helping at [facility] counseling. Hydroxyzine helped itching and anxiety.  Plan: Continue Latuda 120 mg PM.  Is getting  benefit but only been on it for a couple of weeks.   01/17/2021 appointment with following noted: So so.  Some anger outburts a couple of times.  Eats too much.  Blood sugar is pretty bad.  Up at night playing video games and then sleeps too late in the day.  Takes Latuda at evening meal.  Wants to blame meds for these behaviors. Otherwise mood is "good I guess".  No serious highs or lows.  Can get in arguments with mother when she tries to correct him and may feel guilty afterwards. Takes hydroxyzine q 6 hours because of anxiety and bc dx thyroid inflamed, thyroiditis.  Asks if there's anything to help with overeating. Lost weight from thyroid.   Plan: Continue Latuda 120 mg as he is tolerating it and it appears to be controlling mania better than did the Caplyta and he is tolerating it better.  02/12/2021 appointment with the following noted: Perfectly good with mood and depression.  Needs hydroxyzine for itching and anxiety.  Energy poor with thyroiditis. Will sleep too much too bc gets bored and no physical activity. Varies 9-16 hours daily.  Can nap easily.  Not spacy.   No anger lately. Tolerating Latuda well.   Still eats too many sweets late at night and gets high blood sugars.   Plan: Increase  topiramate 50 mg BID off label for weight loss and disc SE.  He wants to try something.  03/20/2021 appointment with the following noted: Taking meds.  A  little benefit with increase topiramate. Mood is perfectly fine without mania nor depression. Blood sugar is a bit high. Claims doctor thinks rash could be related to [facility] but he sleeps all the time bc hypothyroidism. Has folliculitis Started treatment for it and it is helping.   Thyroid problem is a wait and see.  Could take a year to get better. Doesn't think topiramate added tiredness. Not taking hydroxyzine much about once daily. Dealing with difficult GM threatening sui if left alone.   Works with [employer].  Prior  psychiatric medications: risperidone with cognitive side effects, Abilify NR,  Zyprexa SE glucose, Seroquel 600 mg SE wt, perphenazineSE, Saphris 20 partial response but SE increase glucose , haloperidol EPS, Geodon EPS,  Vraylar, loxapine SE, Caplyta NR, Latuda 120 He's prone to EPS. Depakote (Note patient had severe pancreatitis from Depakote.) , Equetro, Lithium was stopped early April 2021 bc of diabetes insipidus.   N-acetylcysteine with minimal benefit, , Amantadine with vomiting.  Sertraline and lexapro sexual SE,   Propranolol for tremor, lithium 1500,     Review of Systems:  Review of Systems  Constitutional: Positive for fatigue.  Endocrine: Positive for polydipsia and polyuria.  Genitourinary: Negative for decreased urine volume and frequency.  Musculoskeletal: Positive for myalgias.  Skin: Positive for rash.       Itching resolved with hydroxyzine  Neurological: Negative for tremors and weakness.  Psychiatric/Behavioral: Positive for decreased concentration. Negative for agitation, behavioral problems, confusion, dysphoric mood, hallucinations, self-injury, sleep disturbance and suicidal ideas. The patient is hyperactive.     Medications: I have reviewed the patient's current medications.  Current Outpatient Medications  Medication Sig Dispense Refill  . Blood Glucose Monitoring Suppl (FREESTYLE LITE) DEVI Use as instructed to check sugar 4 times daily 1 each 0  . ezetimibe (ZETIA) 10 MG tablet Take 10 mg by mouth daily.    . fenofibrate 160 MG tablet Take 160 mg by mouth daily.    Marland Kitchen glucose blood (FREESTYLE LITE) test strip Use as instructed to check sugar 4 times daily 400 each 3  . hydrOXYzine (VISTARIL) 25 MG capsule TAKE 1 CAPSULE (25 MG TOTAL) BY MOUTH EVERY 6 (SIX) HOURS AS NEEDED. 360 capsule 0  . insulin aspart (NOVOLOG FLEXPEN) 100 UNIT/ML FlexPen Inject 25-35 Units into the skin 3 (three) times daily with meals. 60 mL 3  . Insulin Pen Needle (BD PEN NEEDLE NANO  U/F) 32G X 4 MM MISC USE 4 TIMES DAILY WITH INSULIN PENS 400 each 3  . LANTUS SOLOSTAR 100 UNIT/ML Solostar Pen Inject 30 Units into the skin at bedtime. 32.4 mL 3  . metFORMIN (GLUCOPHAGE-XR) 500 MG 24 hr tablet Take 4 tablets (2,000 mg total) by mouth daily with supper. 360 tablet 2  . OneTouch Delica Lancets 30G MISC 1 each by Does not apply route 4 (four) times daily. Use Onetouch Delica lancets to check blood sugar 4 times daily. DX:E11.65 400 each 11  . valsartan (DIOVAN) 160 MG tablet Take 160 mg by mouth daily.    . Lurasidone HCl (LATUDA) 120 MG TABS Take 1 tablet (120 mg total) by mouth every evening. 90 tablet 0  . Rosuvastatin Calcium 5 MG CPSP Take by mouth. (Patient not taking: Reported on 03/20/2021)    . topiramate (TOPAMAX) 50 MG tablet Take 1 tablet (50 mg total) by mouth 2 (two) times daily. 180 tablet 0   No current facility-administered medications for this visit.    Medication Side Effects: Other: less tremor  Allergies:  Allergies  Allergen Reactions  . Divalproex Sodium Other (See Comments)    unknown  . Methylphenidate Hcl     Aggravate bipolar  . Oxycodone Hcl     REACTION: hallucinations  . Ozempic (0.25 Or 0.5 Mg-Dose) [Semaglutide(0.25 Or 0.5mg -Dos)] Nausea Only  . Paroxetine     Aggravate bipolar disorder    Past Medical History:  Diagnosis Date  . 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 Bipolar affective disorder (HCC)   . DM w/o Complication Type II 07/05/2007  . HYPERTRIGLYCERIDEMIA 10/14/2010  . Nocturia 07/04/2010  . PILAR CYST 08/03/2007   Cyst in groin-states comes up when blood sugar gets high. Recurs if cannot walk for a week.     . TOBACCO ABUSE, HX OF 07/31/2009    Family History  Problem Relation Age of Onset  . Diabetes Mother   . Breast cancer Mother        was told not hereditary  . Cirrhosis Mother        fatty liver  . Diabetes Father   . Melanoma Maternal Uncle   . Colon cancer Neg Hx   . Stomach cancer Neg Hx   . Pancreatitis Neg Hx    . Heart disease Neg Hx   . Kidney disease Neg Hx   . Liver disease Neg Hx     Social History   Socioeconomic History  . Marital status: Single    Spouse name: Not on file  . Number of children: Not on file  . Years of education: Not on file  . Highest education level: Not on file  Occupational History  . Not on file  Tobacco Use  . Smoking status: Former Smoker    Packs/day: 2.50    Years: 10.00    Pack years: 25.00    Types: Cigarettes    Quit date: 10/27/2006    Years since quitting: 14.4  . Smokeless tobacco: Never Used  Substance and Sexual Activity  . Alcohol use: No    Alcohol/week: 0.0 standard drinks    Comment: none at all  . Drug use: No  . Sexual activity: Not on file  Other Topics Concern  . Not on file  Social History Narrative   Single. Not dating currently. No kids.    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   Please see review of systems for further details on the patient's review from today.   Objective:   Physical Exam:  There were no vitals taken for this visit.  Physical Exam Neurological:     Mental Status: He is alert and oriented to person, place, and time.     Cranial Nerves: No dysarthria.  Psychiatric:        Attention and Perception: Attention normal.        Mood and Affect: Mood is anxious. Mood is not depressed or elated.        Speech: Speech is not rapid and pressured or slurred.  Behavior: Behavior is not agitated or hyperactive. Behavior  is cooperative.        Thought Content: Thought content is not paranoid or delusional. Thought content does not include homicidal or suicidal ideation. Thought content does not include homicidal or suicidal plan.        Cognition and Memory: Cognition and memory normal.     Comments: No paranoia.   Insight and judgment fair to poor. Chronically self-preoccupied and hyperthymic style a little better.  Less pressured and less manic.      Lab Review:     Component Value Date/Time   NA 140 06/18/2018 1502   K 4.5 06/18/2018 1502   CL 107 06/18/2018 1502   CO2 19 (L) 06/18/2018 1502   GLUCOSE 129 (H) 06/18/2018 1502   BUN 14 06/18/2018 1502   CREATININE 1.24 06/18/2018 1502   CALCIUM 10.3 06/18/2018 1502   PROT 8.3 (H) 06/18/2018 1502   ALBUMIN 4.4 02/10/2017 1631   AST 26 06/18/2018 1502   ALT 24 06/18/2018 1502   ALKPHOS 54 02/10/2017 1631   BILITOT 0.4 06/18/2018 1502   GFRNONAA 84 04/02/2018 1551   GFRAA 97 04/02/2018 1551       Component Value Date/Time   WBC 11.3 (H) 06/27/2016 1441   RBC 4.52 06/27/2016 1441   HGB 13.5 06/27/2016 1441   HCT 39.0 06/27/2016 1441   PLT 248.0 06/27/2016 1441   MCV 86.3 06/27/2016 1441   MCH 29.6 01/28/2014 1250   MCHC 34.5 06/27/2016 1441   RDW 13.2 06/27/2016 1441   LYMPHSABS 1.9 06/27/2016 1441   MONOABS 0.8 06/27/2016 1441   EOSABS 0.2 06/27/2016 1441   BASOSABS 0.1 06/27/2016 1441    Lithium Lvl  Date Value Ref Range Status  09/09/2019 1.1 0.6 - 1.2 mmol/L Final    12/14/19 lithium level 0.6 (trough 13 hours) , normal BMP and calcium   No results found for: PHENYTOIN, PHENOBARB, VALPROATE, CBMZ   .res Assessment: Plan:    Lorenso was seen today for follow-up, bipolar i disorder, most recent episode (or current) manic  and manic behavior.  Diagnoses and all orders for this visit:  Bipolar I disorder, most recent episode (or current) manic (HCC) -     Lurasidone HCl (LATUDA) 120 MG TABS; Take 1 tablet (120 mg total) by  mouth every evening.  Generalized anxiety disorder -     topiramate (TOPAMAX) 50 MG tablet; Take 1 tablet (50 mg total) by mouth 2 (two) times daily.  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  Insomnia due to mental condition  Marijuana abuse in remission   [name] has had significant disruptive mood and psychotic symptoms and substance abuse over the course of his treatment here.  It has been evident in chronic occupational dysfunction and some relational problems.  He is also been very prone to EPS and elevated blood sugars from atypicals making it difficult to get adequate mood stabilization.   Has a history of severe pancreatitis from Depakote.  He did not have an adequate response to carbamazepine.   lithium was insufficient to control his symptoms in monotherapy and he developed diabetes insipidus.  There are no available mood stabilizers that do not have a warning of elevating blood sugar and cholesterol.  He has tried all the mood stabilizers that do not have this warning.  All  of the available mood stabilizers left that have any effectiveness are antipsychotics and the FDA has required a class warning on all antipsychotics about blood sugar and cholesterol effects.  This is true despite the fact that not all antipsychotics are equal and increasing these risks.  The patient's reading of these potential side effects is complicating finding an effective treatment.  The only way to prove whether or not these meds are elevating his blood sugar and cholesterol is to stop the medications for a period of time.  This exposes him to manic risk but there is no chance of finding an adequate mood stabilizer that will satisfy him until we can prove whether or not these meds are in fact affecting his blood sugar, cholesterol, blood pressure, and pulse.  In general he has not  been able to afford branded medications which further limits options such as the use of Latuda which has a lower metabolic risk than other antipsychotics.  He is also EPS prone which further limits options.  He has been less manic n Latuda 120 mg over the last couple of months.  Been the best med so far for him.   Continue Latuda 120 mg PM.  Is getting benefit.. Better tolerated than others so far but he does say it makes him hungry but he has chronic impulse control problems including overeating.  Discussed potential metabolic side effects associated with atypical antipsychotics, as well as potential risk for movement side effects. Advised pt to contact office if movement side effects occur.   Hydroxyzine 25 mg QID prn.  This has been helpful. This will help anxiety and itching and may have some mild antimanic effect.  He has a low stress tolerance.  He can be easily agitated in public.  He has continued abstinence from marijuana is encouraged.  He has a history of cannabinoid hyperemesis which finally convinced him to discontinue the marijuana.  His mood disorder and thought disorganization have improved since being off the marijuana.  He has a very long history of heavy marijuana dependence.   Continue monitoring blood sugar and following advice of endocrinologist.  Continue topiramate 50 mg BID off label for weight loss and disc SE.  He wants to try something.  Sleep hygiene.  I do not think meds are oversedating him.  Follow-up 8 weeks.   Meredith Staggers, MD, DFAPA  Please see After Visit Summary for patient specific instructions.  Future Appointments  Date Time Provider Department Center  05/06/2021  3:30 PM Cottle, Steva Ready., MD [facility] None  05/14/2021  3:40 PM Carlus Pavlov, MD [facility] None    No orders of the defined types were placed in this encounter.     -------------------------------

## 2021-04-04 ENCOUNTER — Other Ambulatory Visit: Payer: Self-pay | Admitting: Psychiatry

## 2021-04-04 DIAGNOSIS — F4001 Agoraphobia with panic disorder: Secondary | ICD-10-CM

## 2021-04-04 DIAGNOSIS — F411 Generalized anxiety disorder: Secondary | ICD-10-CM

## 2021-04-12 ENCOUNTER — Other Ambulatory Visit: Payer: Self-pay | Admitting: Internal Medicine

## 2021-04-12 DIAGNOSIS — E1165 Type 2 diabetes mellitus with hyperglycemia: Secondary | ICD-10-CM

## 2021-04-30 ENCOUNTER — Other Ambulatory Visit: Payer: Self-pay | Admitting: Psychiatry

## 2021-04-30 ENCOUNTER — Encounter: Payer: Self-pay | Admitting: Internal Medicine

## 2021-04-30 DIAGNOSIS — F4001 Agoraphobia with panic disorder: Secondary | ICD-10-CM

## 2021-04-30 DIAGNOSIS — F411 Generalized anxiety disorder: Secondary | ICD-10-CM

## 2021-05-06 ENCOUNTER — Telehealth (INDEPENDENT_AMBULATORY_CARE_PROVIDER_SITE_OTHER): Payer: 59 | Admitting: Psychiatry

## 2021-05-06 ENCOUNTER — Encounter: Payer: Self-pay | Admitting: Psychiatry

## 2021-05-06 DIAGNOSIS — F3110 Bipolar disorder, current episode manic without psychotic features, unspecified: Secondary | ICD-10-CM | POA: Diagnosis not present

## 2021-05-06 DIAGNOSIS — F5113 Hypersomnia due to other mental disorder: Secondary | ICD-10-CM

## 2021-05-06 DIAGNOSIS — F4001 Agoraphobia with panic disorder: Secondary | ICD-10-CM

## 2021-05-06 DIAGNOSIS — F1211 Cannabis abuse, in remission: Secondary | ICD-10-CM

## 2021-05-06 DIAGNOSIS — F411 Generalized anxiety disorder: Secondary | ICD-10-CM

## 2021-05-06 DIAGNOSIS — F5105 Insomnia due to other mental disorder: Secondary | ICD-10-CM

## 2021-05-06 MED ORDER — LURASIDONE HCL 80 MG PO TABS: 80.0000 mg | ORAL_TABLET | Freq: Every day | ORAL | 0 refills | Status: DC

## 2021-05-06 NOTE — Progress Notes (Signed)
Cody Short [id number] 09-Nov-1975 45 y.o.   Virtual Visit via Telephone Note  I connected with pt by telephone and verified that I am speaking with the correct person using two identifiers.   I discussed the limitations, risks, security and privacy concerns of performing an evaluation and management service by telephone and the availability of in person appointments. I also discussed with the patient that there may be a patient responsible charge related to this service. The patient expressed understanding and agreed to proceed.  I discussed the assessment and treatment plan with the patient. The patient was provided an opportunity to ask questions and all were answered. The patient agreed with the plan and demonstrated an understanding of the instructions.   The patient was advised to call back or seek an in-person evaluation if the symptoms worsen or if the condition fails to improve as anticipated.  I provided 30 minutes of non-face-to-face time during this encounter. The patient was located at home and the provider was located office.  Session started at 330 and ended at 400   Subjective:   Patient ID:  Cody Short is a 45 y.o. (DOB [birth year]) male.  Chief Complaint:  Chief Complaint  Patient presents with   Bipolar I disorder, most recent episode (or current) manic    Follow-up   Medication Problem     [name] presents to the office today for follow-up of several psychiatric diagnoses ...    seen August 2020.  For obsessive anxiety  Switched Lexapro 5 mg daily on May 18.  But he stated it made him hungry and so it was stopped in August.  He continued on Saphris 10 mg nightly, lithium 1200 mg daily, propranolol as needed tremor.  Lithium level was ordered.  He called Sept saying he was manic.  Had a lithium level 0.7 and so dose was increased to 1500 mg daily.    Last seen December 21, 2019.  He had some ongoing manic symptoms and Saphris  was increased to 15 mg nightly.  02/07/2020 appointment with the following noted: He has made multiple phone calls to the office since that date.  He decided he wanted to stop lithium because of urinary problems.  He was warned this could lead to mania but he wanted to pursue it anyway.  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  Cody Short had been having bedwetting episodes which she attributed to the lithium.  He has not consistently taken Saphris 15 mg at bedtime since the office visit in February. He completely stopped lithium January 30, 2020 U frequency is much better and less thirsty.  Bedwetting resolved.  Reduced BM to 1-2 times daily.  Gassy.  Glucose is high.  B is hospitalized with unknown disease.  Insulin adjusted. Mental status is best it's ever been.  Less mania.  Less obsessing.  No worsening mania. Is taking Saphris 15 mg HS> Plan no med changes.  04/10/2020 appointment with the following noted: No longer on lithium.  Still frequent urination and thirst DT DM poorly controlled despite meds.  Endo says little else to take bc history of pancreatitis.  Says he goes to urinate q 15 mins. Plan: Wean Saphris in crossover to loxapine 150 mg HS over 2 weeks bc he and endocrinologist believe it's likely Saphris is cause of the inability to get his glucose to acceptable levels.   06/07/20 appt with the following noted: He's called a couple  of times CO sleepiness and reduced loxapine to 50 mg HS. Started Vegan diet and BS is better 4 weeks ago.  Thinks glucose is better off the Saphris. Lost 6 #.   CO sleepiness and sleep 16 hours   He thinks it also drove up pulse and blood pressure. Asks about taking Saphris 10 and a second medication. Mood has been fine.   Plan: He wants to restart Saphris 10 mg HS and use a 2nd mood stabilizer. Start Saphris and reduce Loxapine  To 10 mg at night DT prior failure of 10 Mg Saphris with lithium.  06/27/2020 phone  call from patient: Pt called to report Loxapine & Saphris is causing his BP to be very high and Saphris is causing blood sugar to be high. Apt 10/18. Need changes before then. Call back @ [phone]  MD response: He had wanted to go back on Saphris and reduce loxapine which we did.  Tell him to stop the Saphris and increase loxapine to 20 mg nightly.  He did not tolerate 50 mg of loxapine very well. Nurse TC withpt: Spoke with Cody Short and he will stop the Saphris, but feels the increase in the Loxapine to 20 mg will increase his blood sugars more. Explained that with coming off the Saphris he needs to increase it a bit or he will not be stable with his moods. Advised him to stop the Saphris and would check with Dr. Jennelle Human about any other recommendations.   07/17/20 appt with the following noted: Doing terrible.  Needs melatonin to sleep.  Claims loxapine is elevating BP, pulse, TG, cholesterol, glucose.  Claimed Saphris did it too.   Sees PCP at end of October.   Started Vegan diet about 6 weeks ago and exercising.   A/P: There are no available mood stabilizers that do not have a warning of elevating blood sugar and cholesterol.  He has tried all the mood stabilizers that do not have this warning.  All of the available mood stabilizers left that have any effectiveness are antipsychotics and the FDA has required a class warning on all antipsychotics about blood sugar and cholesterol effects.  This is true despite the fact that not all antipsychotics are equal at increasing these risks.  The patient's reading of these potential side effects is complicating finding an effective treatment.  The only way to prove whether or not these meds are elevating his blood sugar and cholesterol is to stop the medications for a period of time.  This exposes him to manic risk but there is no chance of finding an adequate mood stabilizer that will satisfy him until we can prove whether or not these meds are in fact affecting  his blood sugar, cholesterol, blood pressure, and pulse. Therefore he will stop the loxapine which is at a low dose. We will check labs in 3 weeks.  We will attempt follow-up in 4 weeks.  If he gets manic he will let us know.   08/13/2020 appointment with the following noted: Blood sugar is pretty much the same off the loxapine but BP and pulse improved from 110 to 70 range.  But had started BP med about mid September. Started counselor Illene Labrador at [facility] and started nutritionsist.  Has changed and restricted diet. Sees PCP 07/24/20.  Endo is managing DM.   Mood has been really good.  Counseling helped the anxiety.  Getting 10 hours of sleep.  PCP suggested changing time going to bed earlier and it's  helping.   Sometimes has racing thoughts and gets comments from others while playing video games that he needs to take a break. Patient reports stable mood and deniesr irritable moods.  Denies appetite disturbance.  Patient reports that energy and motivation have been good.  Patient denies any difficulty with concentration.  Patient denies any suicidal ideation.  Admits he's easily stressed.   10/09/20 appt with following noted: Getting manic as hell and depressed as hell. Talking too much and obsessing over things.  Talking too loud and doing things extremely fast.  Only depressed a couple of days ago.  Manic sx are brief and not all the time. Sleep 7-8 hours and sleep schedule is more normal. Stayed on Caplyta and pleased overall with SE. BS is better but hypertension is ongoing. Low TSH. Still counseling. No delusions hallucinations since off drugs. Give samples of Caplyta 42 mg once daily. Prefer no change befor ethe holidays bc he is not continuously manic or depressed. Likely switch to Jordan after the holidays.  11/19/2020 appointment with the following noted: Best I've ever been except some controllable manic.  Able to do things fast and easily.  Anxiety  medicine and counseling has helped.  Better function.  No SE. Not depressed in a while.  But not sure the Capylta helps mania.   Lost from 230# to 189# with diet and exercise over a long period.   Sleep 4-8 hours, but not that I can't sleep but don't feel like sleeping.  Not itching any more.  I'm a a whole new person.   I can't keep up the mania.  Driving my parents and friends crazy. Blood sugar better and off insulin.  Only taking metformin. Can't afford Caplyta. So feels he must change. Hydroxyzine really helped anxiety. Plan: DC Caplyta DT mania.  switch to Latuda 40 then 80 mg with food.  Pick up samples.  12/03/2020 phone call from parents:Parents called and want dr. Jennelle Human to know that Cody Short is maniac and he is having physical aggression and all he does is play video games 24 hours a day and is not sleeping. Parents feel like Cody Short is not giving you the whole picture and needs an adjustment to his medicine. MD response:atient acknowledged that his appointment on January 24 that he was manic while taking Caplyta.  He also acknowledged that his family saw him as being manic.  We discontinued Caplyta and switched him to Jordan.  If he made the switch and increase the dose in a timely manner he should have been on 80 mg daily for a week now.  His parents are complaining that he is still manic.  Provided he is tolerating the Latuda reasonably well have him increase to 120 mg daily.  He can take 1-1/2 of the 80 mg tablets until he runs out and then we can send in a refill for 120 mg daily.  Remind him he needs to take that with at least 350 cal. Phone call with patient from nurse: Patient rtc and he did report he's doing great on the medication but is still having mania. Reports staying up all night playing video games then asleep about 5 am and back up at noon. He is taking medication with 350 calories also. Advised him to increase to 120 mg Latuda daily. He reports having only 40 mg tablets at home so  instructed him to take 3 tablets and I would get more samples out for him to pick up tomorrow. He agreed and  verbalized understanding.  12/07/2020 phone call: Patient called again about his Latuda. He states that it makes him really hungry after he takes it, he takes a nap and wakes up hungry again. He would like to know would it be okay to take before bed instead. MD response: He is unlikely to comply with recommendations about routine sleep and wake times.  He is in a habit of playing video games late into the night.  If he prefers he can split the dosage of the Latuda between breakfast and another meal.  But it is very important that he get the full dose in every day and take it with 350 cal. Nursing phone call with patient:Rtc to patient and he reports he has to take it all at hs because it makes him sleepy, I advised him to continue that regimen. He also reports not sleeping well but he's been drinking caffeine later in the day. Advised him to not drink any caffeine past 2-3 pm. He agreed. He reports his blood sugars have been elevated some, not as bad but it is up some.   12/20/2020 appointment with the following noted: Mania tremendously better but occ anger outbursts anger.  6 hours of sleep with melatonin, hydroxyzine and Latuda 120 mg for 2 weeks.  Staying up all night playing video games. Kasandra KnudsenLatuda does make him hungry right after he takes it so takes it before bedtime.  Taking with food. Hyperactivity and loud talking is better.  I can function fast.  Parents see benefit with mania but are concerned about his anger outbursts but he things she takes stress out on him.  Blood sugar is generally good except when eats too much.  Taking it with dinner and HS.   Able to drive around town.  Can cook and clean.  Less ruminating on his past.  Less need for direction.   Counseling is helping at [facility]. Hydroxyzine helped itching and anxiety.  Plan: Continue Latuda 120 mg PM.  Is getting  benefit but only been on it for a couple of weeks.   01/17/2021 appointment with following noted: So so.  Some anger outburts a couple of times.  Eats too much.  Blood sugar is pretty bad.  Up at night playing video games and then sleeps too late in the day.  Takes Latuda at evening meal.  Wants to blame meds for these behaviors. Otherwise mood is "good I guess".  No serious highs or lows.  Can get in arguments with mother when she tries to correct him and may feel guilty afterwards. Takes hydroxyzine q 6 hours because of anxiety and bc dx thyroid inflamed, thyroiditis.  Asks if there's anything to help with overeating. Lost weight from thyroid.   Plan: Continue Latuda 120 mg as he is tolerating it and it appears to be controlling mania better than did the Caplyta and he is tolerating it better.  02/12/2021 appointment with the following noted: Perfectly good with mood and depression.  Needs hydroxyzine for itching and anxiety.  Energy poor with thyroiditis. Will sleep too much too bc gets bored and no physical activity. Varies 9-16 hours daily.  Can nap easily.  Not spacy.   No anger lately. Tolerating Latuda well.   Still eats too many sweets late at night and gets high blood sugars.   Plan: Increase  topiramate 50 mg BID off label for weight loss and disc SE.  He wants to try something.  03/20/2021 appointment with the following noted: Taking  meds.  A little benefit with increase topiramate. Mood is perfectly fine without mania nor depression. Blood sugar is a bit high. Claims doctor thinks rash could be related to [facility] but he sleeps all the time bc hypothyroidism. Has folliculitis Started treatment for it and it is helping.   Thyroid problem is a wait and see.  Could take a year to get better. Doesn't think topiramate added tiredness. Not taking hydroxyzine much about once daily. Dealing with difficult GM threatening sui if left alone.  Plan: Continue Latuda 120 mg PM.  Is getting  benefit.. Better tolerated than others so far but he does say it makes him hungry but he has chronic impulse control problems including overeating.  05/06/2021 appointment with the following noted: Quit topiramate DT nausea and not helping at all. Rash resolved. Lipids not terrible but a little up.  TSH normalized. HGBA1C 9.3 Not exercising. Not doing much re: work etc other than video games. Not manic lately. Patient reports stable mood and denies depressed or irritable moods.  Patient denies any recent difficulty with anxiety.   Denies appetite disturbance.  Patient reports that energy and motivation have been good.  Patient denies any difficulty with concentration.  Patient denies any suicidal ideation. Energy is better than it was but still sleeping 12 hours daily and up at night playing videos with friends.  Works with [employer].  Prior psychiatric medications: risperidone with cognitive side effects, Abilify NR,  Zyprexa SE glucose, Seroquel 600 mg SE wt, perphenazineSE, Saphris 20 partial response but SE increase glucose , haloperidol EPS, Geodon EPS,  Vraylar, loxapine SE, Caplyta NR, Latuda 120 He's prone to EPS. Depakote (Note patient had severe pancreatitis from Depakote.) , Equetro, Lithium was stopped early April 2021 bc of diabetes insipidus.   N-acetylcysteine with minimal benefit, , Amantadine with vomiting.  Sertraline and lexapro sexual SE,   Propranolol for tremor, lithium 1500,     Review of Systems:  Review of Systems  Constitutional:  Positive for fatigue.  Endocrine: Positive for polydipsia and polyuria.  Genitourinary:  Negative for decreased urine volume and frequency.  Musculoskeletal:  Positive for myalgias.  Skin:  Positive for rash.       Itching resolved with hydroxyzine  Neurological:  Negative for tremors and weakness.  Psychiatric/Behavioral:  Positive for decreased concentration. Negative for agitation, behavioral problems, confusion, dysphoric  mood, hallucinations, self-injury, sleep disturbance and suicidal ideas. The patient is hyperactive.    Medications: I have reviewed the patient's current medications.  Current Outpatient Medications  Medication Sig Dispense Refill   Blood Glucose Monitoring Suppl (FREESTYLE LITE) DEVI Use as instructed to check sugar 4 times daily 1 each 0   fenofibrate 160 MG tablet Take 160 mg by mouth daily.     glucose blood (FREESTYLE LITE) test strip Use as instructed to check sugar 4 times daily 400 each 3   hydrOXYzine (VISTARIL) 25 MG capsule TAKE 1 CAPSULE (25 MG TOTAL) BY MOUTH EVERY 6 (SIX) HOURS AS NEEDED. 360 capsule 0   Insulin Pen Needle (BD PEN NEEDLE NANO U/F) 32G X 4 MM MISC USE 4 TIMES DAILY WITH INSULIN PENS 400 each 3   LANTUS SOLOSTAR 100 UNIT/ML Solostar Pen INJECT 36 UNITS INTO THE SKIN AT BEDTIME. 30 mL 3   lurasidone (LATUDA) 80 MG TABS tablet Take 1 tablet (80 mg total) by mouth daily. 90 tablet 0   metFORMIN (GLUCOPHAGE-XR) 500 MG 24 hr tablet Take 4 tablets (2,000 mg total) by mouth daily with  supper. 360 tablet 2   NOVOLOG FLEXPEN 100 UNIT/ML FlexPen INJECT 30 UNITS INTO THE SKIN 3 (THREE) TIMES DAILY WITH MEALS. 60 mL 2   OneTouch Delica Lancets 30G MISC 1 each by Does not apply route 4 (four) times daily. Use Onetouch Delica lancets to check blood sugar 4 times daily. DX:E11.65 400 each 11   valsartan (DIOVAN) 160 MG tablet Take 160 mg by mouth daily.     ezetimibe (ZETIA) 10 MG tablet Take 10 mg by mouth daily. (Patient not taking: Reported on 05/06/2021)     Rosuvastatin Calcium 5 MG CPSP Take by mouth. (Patient not taking: No sig reported)     No current facility-administered medications for this visit.    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 Recurs if cannot walk for a week.      TOBACCO ABUSE, HX OF 07/31/2009    Family History  Problem Relation Age of Onset   Diabetes Mother    Breast cancer Mother        was told not hereditary   Cirrhosis Mother        fatty liver   Diabetes Father    Melanoma Maternal Uncle    Colon cancer Neg Hx    Stomach cancer Neg Hx    Pancreatitis Neg Hx    Heart disease Neg Hx    Kidney disease Neg Hx    Liver disease Neg Hx     Social History   Socioeconomic History   Marital status: Single    Spouse name: Not on file   Number of children: Not on file   Years of education: Not on file   Highest education level: Not on file  Occupational History   Not on file  Tobacco Use   Smoking status: Former    Packs/day: 2.50    Years: 10.00    Pack years: 25.00    Types: Cigarettes    Quit date: 10/27/2006    Years since quitting: 14.5   Smokeless tobacco: Never  Substance and Sexual Activity   Alcohol use: No    Alcohol/week: 0.0 standard drinks    Comment: none at all   Drug use: No   Sexual activity: Not on file  Other Topics Concern   Not on file  Social History Narrative   Single. Not dating currently. No kids.    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   Please see review of systems for further details on the patient's review from today.   Objective:   Physical Exam:  There were no vitals taken for this visit.  Physical Exam Neurological:     Mental Status: He is alert and oriented to person, place, and time.     Cranial Nerves: No dysarthria.  Psychiatric:        Attention and Perception: Attention normal.        Mood and Affect: Mood is anxious. Mood is not depressed or elated.        Speech: Speech is not rapid and pressured or slurred.        Behavior: Behavior is not agitated or hyperactive. Behavior is cooperative.        Thought Content: Thought content is not paranoid or delusional. Thought content does not include homicidal or suicidal ideation. Thought content does not include homicidal or suicidal plan.        Cognition and Memory: Cognition and memory normal.     Comments: No paranoia.   Insight and judgment fair to poor. Chronically self-preoccupied and hyperthymic style a little better.  Less pressured and less manic.     Lab Review:     Component Value Date/Time   NA 140 06/18/2018 1502   K 4.5 06/18/2018 1502   CL 107 06/18/2018 1502   CO2 19 (L) 06/18/2018 1502   GLUCOSE 129 (H) 06/18/2018 1502   BUN 14 06/18/2018 1502   CREATININE 1.24 06/18/2018 1502   CALCIUM 10.3 06/18/2018 1502   PROT 8.3 (H) 06/18/2018 1502   ALBUMIN 4.4 02/10/2017 1631   AST 26 06/18/2018 1502   ALT 24 06/18/2018 1502   ALKPHOS 54 02/10/2017 1631   BILITOT 0.4 06/18/2018 1502   GFRNONAA 84 04/02/2018 1551   GFRAA 97 04/02/2018 1551       Component Value Date/Time   WBC 11.3 (H) 06/27/2016 1441   RBC 4.52 06/27/2016 1441   HGB 13.5 06/27/2016 1441   HCT 39.0 06/27/2016 1441   PLT 248.0 06/27/2016 1441   MCV 86.3 06/27/2016 1441   MCH 29.6 01/28/2014 1250   MCHC 34.5 06/27/2016  1441   RDW 13.2 06/27/2016 1441   LYMPHSABS 1.9 06/27/2016 1441   MONOABS 0.8 06/27/2016 1441   EOSABS 0.2 06/27/2016 1441   BASOSABS 0.1 06/27/2016 1441    Lithium Lvl  Date Value Ref Range Status  09/09/2019 1.1 0.6 - 1.2 mmol/L Final    12/14/19 lithium level 0.6 (trough 13 hours) , normal BMP and calcium   No results found for: PHENYTOIN, PHENOBARB, VALPROATE, CBMZ   .res Assessment: Plan:    Nahmir was seen today for bipolar i disorder, most recent episode (or current) manic , follow-up and medication problem.  Diagnoses and all orders for this visit:  Bipolar I disorder, most recent episode (or current) manic (HCC) -     lurasidone (LATUDA) 80 MG TABS tablet; Take 1 tablet (80 mg total) by mouth daily.  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  It has been evident in chronic occupational dysfunction and some relational problems.  He is  also been very prone to EPS and elevated blood sugars from atypicals making it difficult to get adequate mood stabilization.   Has a history of severe pancreatitis from Depakote.  He did not have an adequate response to carbamazepine.   lithium was insufficient to control his symptoms in monotherapy and he developed diabetes insipidus.  There are no available mood stabilizers that do not have a warning of elevating blood sugar and cholesterol.  He has tried all the mood stabilizers that do not have this warning.  All of the available mood stabilizers left that have any effectiveness are antipsychotics and the FDA has required a class warning on all antipsychotics about blood sugar and cholesterol effects.  This is true despite the fact that not all antipsychotics are equal and increasing these  risks.  The patient's reading of these potential side effects is complicating finding an effective treatment.  The only way to prove whether or not these meds are elevating his blood sugar and cholesterol is to stop the medications for a period of time.  This exposes him to manic risk but there is no chance of finding an adequate mood stabilizer that will satisfy him until we can prove whether or not these meds are in fact affecting his blood sugar, cholesterol, blood pressure, and pulse.  In general he has not been able to afford branded medications which further limits options such as the use of Latuda which has a lower metabolic risk than other antipsychotics.  He is also EPS prone which further limits options.  He has been less manic n Latuda 120 mg over the last 5 months.  Been the best med so far for him.  He is sleeping a lot.  It is unclear whether that is due to boredom because he is not working or perhaps related to a side effect of Latuda.  We may consider reducing the dose though there is a risk of mania and doing so.  This was discussed with him.  Reduce Latuda to 80 mg to reduce excessive sleepiness.  If mania then increase it back up 120 mg PM.  Is getting benefit.. Better tolerated than others so far but he does say it makes him hungry but he has chronic impulse control problems including overeating.  Discussed potential metabolic side effects associated with atypical antipsychotics, as well as potential risk for movement side effects. Advised pt to contact office if movement side effects occur.   Hydroxyzine 25 mg QID prn.  This has been helpful. This will help anxiety and itching and may have some mild antimanic effect.  He has a low stress tolerance.  He can be easily agitated in public.  He has continued abstinence from marijuana is encouraged.  He has a history of cannabinoid hyperemesis which finally convinced him to discontinue the marijuana.  His mood disorder and thought  disorganization have improved since being off the marijuana.  He has a very long history of heavy marijuana dependence.   DC topiramate DT nausea  Sleep hygiene.   Follow-up 8 weeks.   Meredith Staggers, MD, DFAPA  Please see After Visit Summary for patient specific instructions.  Future Appointments  Date Time Provider Department Center  05/14/2021  3:40 PM Carlus Pavlov, MD [id number] None    No orders of the defined types were placed in this encounter.     -------------------------------

## 2021-05-12 ENCOUNTER — Other Ambulatory Visit: Payer: Self-pay | Admitting: Internal Medicine

## 2021-05-12 DIAGNOSIS — E1165 Type 2 diabetes mellitus with hyperglycemia: Secondary | ICD-10-CM

## 2021-05-14 ENCOUNTER — Ambulatory Visit (INDEPENDENT_AMBULATORY_CARE_PROVIDER_SITE_OTHER): Payer: 59 | Admitting: Internal Medicine

## 2021-05-14 ENCOUNTER — Encounter: Payer: Self-pay | Admitting: Internal Medicine

## 2021-05-14 ENCOUNTER — Other Ambulatory Visit: Payer: Self-pay

## 2021-05-14 VITALS — BP 120/80 | HR 78 | Ht 69.0 in | Wt 228.8 lb

## 2021-05-14 DIAGNOSIS — E669 Obesity, unspecified: Secondary | ICD-10-CM

## 2021-05-14 DIAGNOSIS — E061 Subacute thyroiditis: Secondary | ICD-10-CM | POA: Diagnosis not present

## 2021-05-14 DIAGNOSIS — E1165 Type 2 diabetes mellitus with hyperglycemia: Secondary | ICD-10-CM

## 2021-05-14 DIAGNOSIS — E782 Mixed hyperlipidemia: Secondary | ICD-10-CM

## 2021-05-14 LAB — POCT GLYCOSYLATED HEMOGLOBIN (HGB A1C): Hemoglobin A1C: 7.4 % — AB (ref 4.0–5.6)

## 2021-05-14 MED ORDER — LANTUS SOLOSTAR 100 UNIT/ML ~~LOC~~ SOPN: 54.0000 [IU] | PEN_INJECTOR | Freq: Every day | SUBCUTANEOUS | 3 refills | Status: DC

## 2021-05-14 MED ORDER — NOVOLOG FLEXPEN 100 UNIT/ML ~~LOC~~ SOPN: 35.0000 [IU] | PEN_INJECTOR | Freq: Three times a day (TID) | SUBCUTANEOUS | 3 refills | Status: DC

## 2021-05-14 NOTE — Addendum Note (Signed)
Addended by: Carlus Pavlov on: 05/14/2021 05:01 PM   Modules accepted: Orders

## 2021-05-14 NOTE — Patient Instructions (Addendum)
Please continue: - Metformin ER 2000 mg with dinner - Novolog before meals: 28-35 units 2x a day  Please increase: - Lantus 46-54 units at bedtime  Try to restart exercise.  Look up "the portfolio diet".  Please return in 3 months with your sugar log.

## 2021-05-14 NOTE — Progress Notes (Signed)
Patient ID: Cody Short, male   DOB: 07/06/1976, 45 y.o.   MRN: [id number]  This visit occurred during the SARS-CoV-2 public health emergency.  Safety protocols were in place, including screening questions prior to the visit, additional usage of staff PPE, and extensive cleaning of exam room while observing appropriate contact time as indicated for disinfecting solutions.   HPI: Cody Short is a 45 y.o.-year-old male, presenting for follow-up for DM2, dx in ~2010, insulin-dependent, uncontrolled, without long term complications. Last visit 4 months ago.  Interim history: No increased urination, blurry vision, nausea, chest pain. He continues Latuda - reduced dose in last 2 days.  Since last visit he had to stop Topamax due to nausea.   He just restarted exercise.  Patient's diabetes is directly correlated with his bipolar disease treatment: In the past he had frequent urination resolved after stopping lithium.  While transitioning off lithium, his Saphris dose was increased the blood sugars started to increase afterwards.    He was then taken off Saphris and started on Loxitane.  This was making him sleepier, but was not increasing his blood sugars.  In fact, sugars started to improve and he was able to reduce his insulin doses.   She had to stop Loxitane 2/2 increased BP and pulse.  In summer 2021, he switched to Caplyta >> sugars improved significantly on this.  In fact, he tells me that he was able to come off insulin completely while on this.  However, this did not work for him -was having anger outbursts on it.   He is on Jordan >> feels better on this. However, on this, he was eating more >> sugars increased.  Also, he gained weight.  After last visit, she lost weight to 188 lbs but gained the weight back.  Primary from last visit, he started an appetite suppressant (Topamax). This was causing nausea >> stopped.  Reviewed HbA1c levels: 05/07/2021: HbA1c  9.3% Lab Results  Component Value Date   HGBA1C 7.3 (A) 01/18/2021   HGBA1C 6.9 (A) 09/14/2020   HGBA1C 8.6 (A) 05/10/2020  12/22/2018: HbA1c 7.2%  Pt is on a regimen of: - Metformin ER 2000 mg at bedtime - Novolog before meals: 0 >> 15-30 units before L and D >> 28-35 units 2-3x before meals - Lantus 15 >> 0 >> 25 >> 40 units at bedtime He could not tolerate Ozempic >> nausea, AP We had to stop Jardiance due to increased urination. He was previously on Lantus but stopped since sugars improved. He tried Glipizide >> hypoglycemia in the 40s repeatedly. Lowest: 25. Also, Glipizide 2.5 mg in am >> nausea, vomiting, constipation We tried Invokana >> bothersome urination (when he was working) >> had to stop. He had pancreatitis 2/2 Depakote and HTG in the past. He stopped Actos b/c stomach pain.  We stopped Cycloset >> inefficient, could not use b/c price.  Pt checks his sugars 4 times a day per review of his log: - am:    114-175 >> 94-149, 192 >> 95, 110-197 >> 131-254 - 2h after b'fast: 247 >> n/c >> 100-166, 244 >> 101 >> 162, 173, 325 - lunch:  n/c >> 219 >> 116, 166 >> 161, 253, 264 >> 172-259 - 2h after lunch:  71-132, 171 >> 140-262, 310 >> 110-202, 290 - before dinner:  149-259 >> 102-188 >> 124-237 >> 114-337 - 2h after dinner:  133--269 >> 87-134 >> 106-236 >> n/c - bedtime:  119-207 >> 80-160, 190 >> 107-180 >>  n/c - nighttime: 89-181 >> see above >> 134 >> 83, 95 >> n/c Lowest sugar was 50s >> 75 >> 71 >> 69 >> 90; he has hypoglycemia awareness in the 70s. Highest sugar was 399 >> 468 >> 244 >> 310 >> 337.  Glucometer: FreeStyle Lite (he likes this)  No history of CKD, last BUN/creatinine:  08/06/2020: 12/1.11 12/14/2019: 8/1.09, GFR 84, glucose 102 12/22/2018: 15/1.13, GFR 81, glucose 110 Lab Results  Component Value Date   BUN 14 06/18/2018   CREATININE 1.24 06/18/2018   He had MAU in the past, improved at last check: Lab Results  Component Value Date    MICRALBCREAT 35.2 (H) 05/10/2020   MICRALBCREAT 26.7 04/02/2018   MICRALBCREAT 3.7 02/10/2017   MICRALBCREAT 27.3 12/17/2015   MICRALBCREAT 4.0 02/22/2015  12/22/2018: 262 On Diovan.  He has mixed hyperlipidemia: Lab Results  Component Value Date   CHOL 187 08/06/2020   HDL 41 08/06/2020   LDLCALC 110 (H) 08/06/2020   LDLDIRECT 120.0 04/02/2018   TRIG 208 (H) 08/06/2020   CHOLHDL 4.6 08/06/2020  05/03/2020: 225/345/38/126 11/23/2019: 109/148/34/49 12/22/2018: 220/359/41/145 On fenofibrate 145. Zetia, Rosuvastatin caused leg cramps, reportedly elevated CK (900s).  - last eye exam was in 03/2017: No DR  -+ Numbness but no tingling in his feet  In 2010, he developed pancreatitis from Depakote.  He has a history of scrotal rash-improved on Nystatin + Triamcinolone.  He may need refills in the future. He has recurrent groin furunculosis.  In the last several months, he developed subclinical thyrotoxicosis: TSH levels were reviewed: 05/07/2021: TSH 1.21 Lab Results  Component Value Date   TSH <0.01 (L) 01/18/2021   TSH <0.01 (L) 12/11/2020   TSH 0.02 (L) 09/14/2020   TSH 4.03 12/17/2015   TSH 2.08 01/03/2014   TSH 5.15 11/20/2011   TSH 2.15 07/04/2010   TSH 3.82 12/12/2009   TSH 1.81 10/29/2007   FREET4 1.13 01/18/2021   FREET4 1.56 12/11/2020   FREET4 1.41 09/14/2020   T3FREE 3.5 01/18/2021   T3FREE 4.1 12/11/2020   T3FREE 3.9 09/14/2020  Previously: 08/06/2020: TSH 0.079 05/03/2020: TSH 0.528 12/22/2018: 3.648  We checked a thyroid uptake and scan (01/02/2021) and this was consistent with thyroiditis: The thyroid scan is unremarkable. No hot or cold thyroid nodules are identified. 4 hour I-123 uptake = 2.7% (normal 5-20%) 24 hour I-123 uptake = 5.4% (normal 10-30%)   IMPRESSION: Low 4 hour and 24 hour or I 123 uptake may suggest thyroiditis.   Pt denies: - feeling nodules in neck - hoarseness - dysphagia - choking - SOB with lying down  He denies  hypothyroid symptoms.  ROS: Constitutional: + weight gain/no weight loss, no fatigue, no subjective hyperthermia, no subjective hypothermia Eyes: no blurry vision, no xerophthalmia ENT: no sore throat, + see HPI Cardiovascular: no CP/no SOB/no palpitations/no leg swelling Respiratory: no cough/no SOB/no wheezing Gastrointestinal: no N/no V/no D/no C/no acid reflux Musculoskeletal: no muscle aches/no joint aches Skin: no rashes, no hair loss Neurological: no tremors/no numbness/no tingling/no dizziness  I reviewed pt's medications, allergies, PMH, social hx, family hx, and changes were documented in the history of present illness. Otherwise, unchanged from my initial visit note.  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 Recurs if  cannot walk for a week.      TOBACCO ABUSE, HX OF 07/31/2009   Past Surgical History:  Procedure Laterality Date   pilar cystectomy     History   Social History   Marital Status: Single    Spouse Name: N/A   Number of Children: 0   Occupational History      Social History Main Topics   Smoking status: Former Smoker - quit in 2010   Smokeless tobacco: Never Used   Alcohol Use: No     Comment: none at all   Drug Use: No   Current Outpatient Medications  Medication Sig Dispense Refill   Blood Glucose Monitoring Suppl (FREESTYLE LITE) DEVI Use as instructed to check sugar 4 times daily 1 each 0   ezetimibe (ZETIA) 10 MG tablet Take 10 mg by mouth daily. (Patient not taking: Reported on 05/06/2021)     fenofibrate 160 MG tablet Take 160 mg by mouth daily.     glucose blood (FREESTYLE LITE) test strip Use as instructed to check sugar 4 times daily 400 each 3   hydrOXYzine (VISTARIL) 25 MG capsule TAKE 1 CAPSULE  (25 MG TOTAL) BY MOUTH EVERY 6 (SIX) HOURS AS NEEDED. 360 capsule 0   Insulin Pen Needle (BD PEN NEEDLE NANO U/F) 32G X 4 MM MISC USE 4 TIMES DAILY WITH INSULIN PENS 400 each 3   LANTUS SOLOSTAR 100 UNIT/ML Solostar Pen INJECT 36 UNITS INTO THE SKIN AT BEDTIME. 30 mL 3   lurasidone (LATUDA) 80 MG TABS tablet Take 1 tablet (80 mg total) by mouth daily. 90 tablet 0   metFORMIN (GLUCOPHAGE-XR) 500 MG 24 hr tablet TAKE 4 TABLETS (2,000 MG TOTAL) BY MOUTH DAILY WITH SUPPER. 360 tablet 2   NOVOLOG FLEXPEN 100 UNIT/ML FlexPen INJECT 30 UNITS INTO THE SKIN 3 (THREE) TIMES DAILY WITH MEALS. 60 mL 2   OneTouch Delica Lancets 30G MISC 1 each by Does not apply route 4 (four) times daily. Use Onetouch Delica lancets to check blood sugar 4 times daily. DX:E11.65 400 each 11   Rosuvastatin Calcium 5 MG CPSP Take by mouth. (Patient not taking: No sig reported)     valsartan (DIOVAN) 160 MG tablet Take 160 mg by mouth daily.     No current facility-administered medications for this visit.    No current facility-administered medications on file prior to visit.    Allergies  Allergen Reactions   Divalproex Sodium Other (See Comments)    unknown   Methylphenidate Hcl     Aggravate bipolar   Oxycodone Hcl     REACTION: hallucinations   Ozempic (0.25 Or 0.5 Mg-Dose) [Semaglutide(0.25 Or 0.5mg -Dos)] Nausea Only   Paroxetine     Aggravate bipolar disorder   Family History  Problem Relation Age of Onset   Diabetes Mother    Breast cancer Mother        was told not hereditary   Cirrhosis Mother        fatty liver   Diabetes Father    Melanoma Maternal Uncle    Colon cancer Neg Hx    Stomach cancer Neg Hx    Pancreatitis Neg Hx    Heart disease Neg Hx    Kidney disease Neg Hx    Liver disease Neg Hx    PE: BP 120/80 (BP Location: Right Arm, Patient Position: Sitting, Cuff Size: Normal)   Pulse 78   Ht  (1.753 m)   Wt 228 lb 12.8 oz (103.8 kg)  SpO2 97%   BMI 33.79 kg/m  Body mass index  is 33.79 kg/m. Wt Readings from Last 3 Encounters:  05/14/21 228 lb 12.8 oz (103.8 kg)  01/18/21 213 lb 6.4 oz (96.8 kg)  09/14/20 213 lb 6.4 oz (96.8 kg)   Constitutional: overweight, in NAD Eyes: PERRLA, EOMI, no exophthalmos ENT: moist mucous membranes, no thyromegaly, no cervical lymphadenopathy Cardiovascular: tachycardia, RR, No MRG Respiratory: CTA B Gastrointestinal: abdomen soft, NT, ND, BS+ Musculoskeletal: no deformities, strength intact in all 4 Skin: moist, warm, no rashes Neurological: no tremor with outstretched hands, DTR normal in all 4  ASSESSMENT: 1. DM2, insulin-dependent, uncontrolled, without long term complications, but with hyperglycemia  His test were negative for type 1 diabetes: Component     Latest Ref Rng 05/28/2015  C-Peptide     0.80 - 3.90 ng/mL 2.88  Glucose, Fasting     65 - 99 mg/dL 784 (H)  Glutamic Acid Decarb Ab     <5 IU/mL <5  Pancreatic Islet Cell Antibody     < 5 JDF Units <5   - His diabetes is difficult to manage because of multiple intolerances: - We tried to add Invokana but he did not tolerated due to increased urination.  - We cannot use a DPP 4 inhibitor or a GLP-1 receptor agonist due to his history of pancreatitis.  - He had to stop Actos >>  abd. Pain - we stopped Cycloset >> expensive and not effective - we tried Glipizide >> GI sxs: N/V/D - he tried the Dexcom G6 CGM but he felt that this was giving him a lot of errors and stopped  2. HTG  3.  Obesity class I  4.  Subacute thyroiditis  PLAN:  1. Patient with longstanding, uncontrolled, type 2 diabetes, with worsening control in the last year.  At last visit, HbA1c was higher, at 7.3% but it was higher when checked a week ago, at 9.8%.  His diabetes control is related to his psychotropic medications.  The sugars were worse on Saphris and also on Latuda but at last visit he was preparing to start Topamax, which can be associated with better blood sugars. -At this visit,  he tells me that sugars are still fluctuating and elevated, and he is taking Lantus and NovoLog consistently.  He takes a relatively higher dose of NovoLog compared to Lantus (70-100 units a day of NovoLog compared to only 40 units of Lantus).  We discussed about trying to vary the dose of NovoLog based on the size of the meals, which he is trying to do and we also discussed about increasing the Lantus dose so that we can improve his fasting blood sugars -Now that his thyroid tests are abnormal, he can restart exercising consistently.  Also, he started to change his diet to include more vegetables and fruit, which worked very well for him in the past. - I advised him to: Patient Instructions  Please continue: - Metformin ER 2000 mg with dinner - Novolog before meals: 28-35 units 2x a day  Please increase: - Lantus 46-54 units at bedtime  Try to restart exercise.  Look up "the portfolio diet".  Please return in 3 months with your sugar log.  - advised to check sugars at different times of the day - 3x a day, rotating check times - advised for yearly eye exams >> he is UTD - return to clinic in 4 months  2. HL -He has a history of hypertriglyceridemia - Reviewed latest  lipid panel from 07/2020 and triglycerides were decreased. Lab Results  Component Value Date   CHOL 187 08/06/2020   HDL 41 08/06/2020   LDLCALC 110 (H) 08/06/2020   LDLDIRECT 120.0 04/02/2018   TRIG 208 (H) 08/06/2020   CHOLHDL 4.6 08/06/2020  - Continues fenofibrate 145 mg daily without side effects.  I also recommended to look up the "portfolio diet" for further improvement in his lipids. -He had another lipid panel with PCP recently and he will send this to me.  3.  Obesity class I -He gained a significant amount of weight on Latuda, but at last visit he was preparing to start Topamax, which can help with weight loss.  Unfortunately, he could not tolerate this due to GI side effects.  The dose of Latuda was  reduced recently.  M -We discussed about Ozempic in the past, but he has a history of pancreatitis so this is not first-line for him.  We discussed about possibly starting and watch closely for symptoms, but he was reticent. -Discussed about starting to exercise consistently now, which will greatly help, along with improving diet to include more plant-based foods.  4.  Subacute thyroiditis -With normal TSI's -The diagnosis of thyroiditis was established by thyroid uptake and scan -Most recent TFTs from a week ago were normal per patient's report.  He was sent me the results. -We will need to repeat his TFTs at next visit.  Carlus Pavlov, MD PhD [facility] Endocrinology

## 2021-05-17 ENCOUNTER — Emergency Department (HOSPITAL_BASED_OUTPATIENT_CLINIC_OR_DEPARTMENT_OTHER)
Admission: EM | Admit: 2021-05-17 | Discharge: 2021-05-17 | Disposition: A | Discharge status: Home / Self Care | Payer: 59 | Attending: Emergency Medicine | Admitting: Emergency Medicine

## 2021-05-17 ENCOUNTER — Other Ambulatory Visit: Payer: Self-pay

## 2021-05-17 ENCOUNTER — Encounter: Payer: Self-pay | Admitting: Internal Medicine

## 2021-05-17 ENCOUNTER — Encounter (HOSPITAL_BASED_OUTPATIENT_CLINIC_OR_DEPARTMENT_OTHER): Payer: Self-pay

## 2021-05-17 DIAGNOSIS — Z87891 Personal history of nicotine dependence: Secondary | ICD-10-CM | POA: Insufficient documentation

## 2021-05-17 DIAGNOSIS — M62838 Other muscle spasm: Secondary | ICD-10-CM | POA: Diagnosis not present

## 2021-05-17 DIAGNOSIS — Z7984 Long term (current) use of oral hypoglycemic drugs: Secondary | ICD-10-CM | POA: Diagnosis not present

## 2021-05-17 DIAGNOSIS — Z794 Long term (current) use of insulin: Secondary | ICD-10-CM | POA: Diagnosis not present

## 2021-05-17 DIAGNOSIS — R748 Abnormal levels of other serum enzymes: Secondary | ICD-10-CM | POA: Insufficient documentation

## 2021-05-17 DIAGNOSIS — E119 Type 2 diabetes mellitus without complications: Secondary | ICD-10-CM | POA: Insufficient documentation

## 2021-05-17 DIAGNOSIS — J45909 Unspecified asthma, uncomplicated: Secondary | ICD-10-CM | POA: Diagnosis not present

## 2021-05-17 LAB — COMPREHENSIVE METABOLIC PANEL
ALT: 45 U/L — ABNORMAL HIGH (ref 0–44)
AST: 54 U/L — ABNORMAL HIGH (ref 15–41)
Albumin: 4.7 g/dL (ref 3.5–5.0)
Alkaline Phosphatase: 65 U/L (ref 38–126)
Anion gap: 11 (ref 5–15)
BUN: 12 mg/dL (ref 6–20)
CO2: 23 mmol/L (ref 22–32)
Calcium: 9.2 mg/dL (ref 8.9–10.3)
Chloride: 105 mmol/L (ref 98–111)
Creatinine, Ser: 1.1 mg/dL (ref 0.61–1.24)
GFR, Estimated: >60 mL/min (ref >60)
Glucose, Bld: 88 mg/dL (ref 70–99)
Potassium: 3.8 mmol/L (ref 3.5–5.1)
Sodium: 139 mmol/L (ref 135–145)
Total Bilirubin: 0.3 mg/dL (ref 0.3–1.2)
Total Protein: 7.5 g/dL (ref 6.5–8.1)

## 2021-05-17 LAB — MAGNESIUM: Magnesium: 1.7 mg/dL (ref 1.7–2.4)

## 2021-05-17 LAB — CBC
HCT: 40.8 % (ref 39.0–52.0)
Hemoglobin: 13.6 g/dL (ref 13.0–17.0)
MCH: 29.6 pg (ref 26.0–34.0)
MCHC: 33.3 g/dL (ref 30.0–36.0)
MCV: 88.7 fL (ref 80.0–100.0)
Platelets: 243 10*3/uL (ref 150–400)
RBC: 4.6 MIL/uL (ref 4.22–5.81)
RDW: 13.2 % (ref 11.5–15.5)
WBC: 7.9 10*3/uL (ref 4.0–10.5)
nRBC: 0 % (ref 0.0–0.2)

## 2021-05-17 LAB — CK: Total CK: 562 U/L — ABNORMAL HIGH (ref 49–397)

## 2021-05-17 LAB — LIPASE, BLOOD: Lipase: 67 U/L — ABNORMAL HIGH (ref 11–51)

## 2021-05-17 MED ORDER — SODIUM CHLORIDE 0.9 % IV BOLUS (SEPSIS)
1000.0000 mL | Freq: Once | INTRAVENOUS | Status: AC
  Administered 2021-05-17: 1000 mL via INTRAVENOUS

## 2021-05-17 MED ORDER — SODIUM CHLORIDE 0.9 % IV SOLN
1000.0000 mL | INTRAVENOUS | Status: DC
  Administered 2021-05-17: 1000 mL via INTRAVENOUS

## 2021-05-17 NOTE — Discharge Instructions (Addendum)
Continue to drink plenty of fluids and stay well-hydrated.  Stop taking the fenofibrate medication.  Follow-up with your doctor to be rechecked

## 2021-05-17 NOTE — ED Provider Notes (Signed)
[facility] EMERGENCY DEPT Provider Note   CSN: [id number] Arrival date & time: 05/17/21  1604     History Chief Complaint  Patient presents with   Spasms    Cody Short is a 45 y.o. male.  HPI  Patient presents to the ED with complaints of muscle spasms.  Patient states he started having symptoms several weeks ago.  It started after he began taking Zetia for his cholesterol.  Patient's doctor apparently noted that his creatinine kinase enzymes were elevated.  He was taken off that medication several weeks ago and the creatinine kinase level was decreasing.  However this evening he started having more spasms again.  He felt them in his back as well as his chest and his abdomen.  He called the nurse and they instructed him to come to the ED for evaluation.  He is not having any chest pain or shortness of breath.  No vomiting or diarrhea.  No dysuria.  No fevers or chills.  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 Recurs if cannot walk for a week.      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   Type 2 diabetes mellitus with hyperglycemia, without long-term current use of insulin (HCC) 02/11/2016   Former smoker 06/27/2015   Rectal bleeding 08/22/2014   Hx of pancreatitis 02/07/2014   Asthma 04/25/2013   Bipolar depression (HCC) 03/23/2013   HYPERTRIGLYCERIDEMIA 10/14/2010   Acne 07/04/2010    Past Surgical History:  Procedure Laterality  Date   pilar cystectomy         Family History  Problem Relation Age of Onset   Diabetes Mother    Breast cancer Mother        was told not hereditary   Cirrhosis Mother        fatty liver   Diabetes Father    Melanoma Maternal Uncle    Colon cancer Neg Hx    Stomach cancer Neg Hx    Pancreatitis Neg Hx    Heart disease Neg Hx    Kidney disease Neg Hx    Liver disease Neg Hx     Social History   Tobacco Use   Smoking status: Former    Packs/day: 2.50    Years: 10.00    Pack years: 25.00    Types: Cigarettes    Quit date: 10/27/2006    Years since quitting: 14.5   Smokeless tobacco: Never  Substance Use Topics   Alcohol use: No    Alcohol/week: 0.0 standard drinks    Comment: none at all   Drug use: No    Home Medications Prior to Admission medications   Medication Sig Start Date End Date Taking? Authorizing Provider  Blood Glucose Monitoring Suppl (FREESTYLE LITE) DEVI Use as instructed to check sugar 4 times daily 01/17/21   Carlus Pavlov, MD  glucose blood (FREESTYLE LITE) test strip Use as instructed to check sugar 4 times daily 01/18/21  Carlus Pavlov, MD  hydrOXYzine (VISTARIL) 25 MG capsule TAKE 1 CAPSULE (25 MG TOTAL) BY MOUTH EVERY 6 (SIX) HOURS AS NEEDED. 03/11/21   Cottle, Steva Ready., MD  insulin aspart (NOVOLOG FLEXPEN) 100 UNIT/ML FlexPen Inject 35 Units into the skin 3 (three) times daily with meals. 05/14/21   Carlus Pavlov, MD  Insulin Pen Needle (BD PEN NEEDLE NANO U/F) 32G X 4 MM MISC USE 4 TIMES DAILY WITH INSULIN PENS 04/17/20   Carlus Pavlov, MD  LANTUS SOLOSTAR 100 UNIT/ML Solostar Pen Inject 54 Units into the skin daily. 05/14/21   Carlus Pavlov, MD  lurasidone (LATUDA) 80 MG TABS tablet Take 1 tablet (80 mg total) by mouth daily. 05/06/21   Cottle, Steva Ready., MD  metFORMIN (GLUCOPHAGE-XR) 500 MG 24 hr tablet TAKE 4 TABLETS (2,000 MG TOTAL) BY MOUTH DAILY WITH SUPPER. 05/13/21   Carlus Pavlov, MD  OneTouch Delica Lancets 30G  MISC 1 each by Does not apply route 4 (four) times daily. Use Onetouch Delica lancets to check blood sugar 4 times daily. DX:E11.65 11/29/19   Carlus Pavlov, MD  valsartan (DIOVAN) 160 MG tablet Take 160 mg by mouth daily.    [provider]    Allergies    Divalproex sodium, Methylphenidate hcl, Oxycodone hcl, Ozempic (0.25 or 0.5 mg-dose) [semaglutide(0.25 or 0.5mg -dos)], Paroxetine, and Rosuvastatin  Review of Systems   Review of Systems  All other systems reviewed and are negative.  Physical Exam Updated Vital Signs BP 140/82   Pulse 74   Temp 98.4 F (36.9 C)   Resp 17   Ht 1.753 m (5\' 9" )   Wt 103.4 kg   SpO2 96%   BMI 33.67 kg/m   Physical Exam Vitals and nursing note reviewed.  Constitutional:      Appearance: He is well-developed. He is not ill-appearing or diaphoretic.  HENT:     Head: Normocephalic and atraumatic.     Right Ear: External ear normal.     Left Ear: External ear normal.  Eyes:     General: No scleral icterus.       Right eye: No discharge.        Left eye: No discharge.     Conjunctiva/sclera: Conjunctivae normal.  Neck:     Trachea: No tracheal deviation.  Cardiovascular:     Rate and Rhythm: Normal rate and regular rhythm.  Pulmonary:     Effort: Pulmonary effort is normal. No respiratory distress.     Breath sounds: Normal breath sounds. No stridor. No wheezing or rales.  Abdominal:     General: Bowel sounds are normal. There is no distension.     Palpations: Abdomen is soft.     Tenderness: There is no abdominal tenderness. There is no guarding or rebound.  Musculoskeletal:        General: No tenderness or deformity.     Cervical back: Neck supple.     Right lower leg: No edema.     Left lower leg: No edema.  Skin:    General: Skin is warm and dry.     Findings: No rash.  Neurological:     General: No focal deficit present.     Mental Status: He is alert.     Cranial Nerves: No cranial nerve deficit (no facial droop,  extraocular movements intact, no slurred speech).     Sensory: No sensory deficit.     Motor: No abnormal muscle tone or seizure activity.     Coordination: Coordination  normal.  Psychiatric:        Mood and Affect: Mood normal.    ED Results / Procedures / Treatments   Labs (all labs ordered are listed, but only abnormal results are displayed) Labs Reviewed  COMPREHENSIVE METABOLIC PANEL - Abnormal; Notable for the following components:      Result Value   AST 54 (*)    ALT 45 (*)    All other components within normal limits  LIPASE, BLOOD - Abnormal; Notable for the following components:   Lipase 67 (*)    All other components within normal limits  CK - Abnormal; Notable for the following components:   Total CK 562 (*)    All other components within normal limits  CBC  MAGNESIUM    EKG None  Radiology No results found.  Procedures Procedures   Medications Ordered in ED Medications  sodium chloride 0.9 % bolus 1,000 mL (0 mLs Intravenous Stopped 05/17/21 2114)    Followed by  0.9 %  sodium chloride infusion (0 mLs Intravenous Stopped 05/17/21 2114)    ED Course  I have reviewed the triage vital signs and the nursing notes.  Pertinent labs & imaging results that were available during my care of the patient were reviewed by me and considered in my medical decision making (see chart for details).  Clinical Course as of 05/17/21 2329  Fri May 17, 2021  2040 CK is slightly elevated at 562.  Metabolic panel is unremarkable. [JK]  2041 Lipase slightly elevated at 67 [JK]  2041 CBC is normal.  Magnesium is normal [JK]    Clinical Course User Index [JK] Linwood Dibbles, MD   MDM Rules/Calculators/A&P                           Patient presented to the ED with complaints of intermittent muscle cramps.  Patient was previously diagnosed with what sounds like rhabdo myolysis associated with his cholesterol medication.  Patient was taken off that medication switched to  another.  Patient's labs today do show slight persistent elevation of the CK but no signs of renal dysfunction.  Lipase slightly elevated but doubt this is related to pancreatitis is not having abdominal tenderness.  Fenofibrate was discontinued today.  Patient was given IV fluids and I think he can safely be followed up as an outpatient.  Instructed him to not resume that medication. Final Clinical Impression(s) / ED Diagnoses Final diagnoses:  Elevated CK    Rx / DC Orders ED Discharge Orders     None        Linwood Dibbles, MD 05/17/21 2330

## 2021-05-17 NOTE — ED Triage Notes (Signed)
"  My upper body, upper abdomen, chest, shoulders, and back are cramping and having spasms x 1 month after starting Zetia. I saw my doctor and my creatnine level was up around 5 days ago. I couldn't play virtual reality video game" per pt

## 2021-05-17 NOTE — ED Notes (Signed)
Pt verbalizes understanding of discharge instructions. Opportunity for questioning and answers were provided. Armand removed by staff, pt discharged from ED to home. Educated to pick up Rx.  

## 2021-05-20 ENCOUNTER — Encounter: Payer: Self-pay | Admitting: Internal Medicine

## 2021-05-21 ENCOUNTER — Telehealth: Payer: Self-pay

## 2021-05-21 NOTE — Telephone Encounter (Signed)
Pt called to report he went to the ER because his CK level was elevated and a lot of his medications were changed. He has concerns about Latuda and Hydroxyzine I believe is what he mentioned. His next apt is 07/30/2021   Please review ER visit on 05/17/2021

## 2021-05-22 NOTE — Telephone Encounter (Signed)
LM with information

## 2021-05-22 NOTE — Telephone Encounter (Signed)
ER doc says CK is only slightly elevated.  Rec no med changes while I'm out of town.

## 2021-06-07 ENCOUNTER — Other Ambulatory Visit: Payer: Self-pay | Admitting: Psychiatry

## 2021-06-07 DIAGNOSIS — F411 Generalized anxiety disorder: Secondary | ICD-10-CM

## 2021-06-16 ENCOUNTER — Other Ambulatory Visit: Payer: Self-pay | Admitting: Psychiatry

## 2021-06-16 DIAGNOSIS — F411 Generalized anxiety disorder: Secondary | ICD-10-CM

## 2021-06-16 DIAGNOSIS — F4001 Agoraphobia with panic disorder: Secondary | ICD-10-CM

## 2021-06-19 ENCOUNTER — Other Ambulatory Visit: Payer: Self-pay

## 2021-06-19 ENCOUNTER — Encounter: Payer: Self-pay | Admitting: Cardiology

## 2021-06-19 ENCOUNTER — Ambulatory Visit: Payer: 59 | Admitting: Cardiology

## 2021-06-19 VITALS — BP 121/83 | HR 84 | Temp 98.0°F | Resp 17 | Ht 69.0 in | Wt 232.0 lb

## 2021-06-19 DIAGNOSIS — R0789 Other chest pain: Secondary | ICD-10-CM

## 2021-06-19 DIAGNOSIS — R748 Abnormal levels of other serum enzymes: Secondary | ICD-10-CM

## 2021-06-19 DIAGNOSIS — G720 Drug-induced myopathy: Secondary | ICD-10-CM

## 2021-06-19 DIAGNOSIS — E1165 Type 2 diabetes mellitus with hyperglycemia: Secondary | ICD-10-CM

## 2021-06-19 DIAGNOSIS — E782 Mixed hyperlipidemia: Secondary | ICD-10-CM

## 2021-06-19 NOTE — Progress Notes (Signed)
Primary Physician/Referring:  Lewis Moccasinewey, Elizabeth R, MD  Patient ID: Cody Short, male    DOB: [birth year], 45 y.o.   MRN: [id number]  Chief Complaint  Patient presents with   New Patient (Initial Visit)   Chest Pain   elevated ck   HPI:    Cody DerryVincent P Suto  is a 45 y.o. Caucasian male patient with bipolar disorder, hypertension, mixed hyperlipidemia, type 2 diabetes and young which is uncontrolled, obesity referred to me for evaluation of hyperlipidemia and management of chest tightness, patient recently has had elevated CPK enzymes being on fenofibric acid.  Patient has had had what appears to be subclinical hyperthyroidism with markedly reduced TSH and normal T3 and T4.  He also follows Dr. Wyonia HoughGerghe for diabetes.  He describes chest pain as tightness and pressure-like sensation in the middle of the chest, worse on bending forward or turning his body on the sides but sometimes with activity.  Lasted few seconds to few minutes.  No other associated symptoms of dizziness, dyspnea or palpitations.  He quit smoking cigarettes in 2010, has now been concerned about cardiac issues and he is trying to lose weight and change his diet.  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 Recurs if cannot walk for a week.      TOBACCO ABUSE, HX OF 07/31/2009   Past Surgical History:  Procedure Laterality Date   pilar cystectomy     Family History  Problem Relation Age of Onset   Diabetes Mother    Breast cancer Mother        was told not hereditary   Cirrhosis Mother        fatty liver   Diabetes Father    Melanoma Maternal Uncle    Colon cancer Neg Hx    Stomach cancer Neg Hx    Pancreatitis Neg Hx    Heart disease Neg Hx    Kidney disease  Neg Hx    Liver disease Neg Hx     Social History   Tobacco Use   Smoking status: Former    Packs/day: 2.50    Years: 10.00    Pack years: 25.00    Types: Cigarettes    Quit date: 10/27/2006    Years since quitting: 14.6   Smokeless tobacco: Never  Substance Use Topics   Alcohol use: No    Alcohol/week: 0.0 standard drinks    Comment: none at all   Marital Status: Single  ROS  Review of Systems  Constitutional: Positive for weight gain.  Cardiovascular:  Positive for chest pain. Negative for dyspnea on exertion and leg swelling.  Gastrointestinal:  Negative for melena.  Psychiatric/Behavioral:  Positive for depression. The patient is nervous/anxious.   Objective  Blood pressure 121/83, pulse 84, temperature 98 F (36.7 C), resp. rate 17, height 5\' 9"  (1.753 m), weight 232 lb (105.2 kg), SpO2 98 %. Body mass index is 34.26 kg/m.  Vitals with BMI 06/19/2021 05/17/2021 05/17/2021  Height 5\' 9"  - -  Weight 232 lbs - -  BMI 34.24 - -  Systolic 121 140 [id number]  Diastolic 83 82 82  Pulse 84 74 76  Some encounter information is confidential and restricted. Go to Review Flowsheets activity to see all data.  Physical Exam Constitutional:      Appearance: He is obese.  Neck:     Vascular: No carotid bruit or JVD.  Cardiovascular:     Rate and Rhythm: Normal rate and regular rhythm.     Pulses: Intact distal pulses.     Heart sounds: Normal heart sounds. No murmur heard.   No gallop.  Pulmonary:     Effort: Pulmonary effort is normal.     Breath sounds: Normal breath sounds.  Abdominal:     General: Bowel sounds are normal.     Palpations: Abdomen is soft.  Musculoskeletal:        General: No swelling.   Laboratory examination:   Recent Labs    05/17/21 1952  NA 139  K 3.8  CL 105  CO2 23  GLUCOSE 88  BUN 12  CREATININE 1.10  CALCIUM 9.2  GFRNONAA >60   CrCl cannot be calculated (Patient's most recent lab result is older than the maximum 21 days allowed.).  CMP  Latest Ref Rng & Units 05/17/2021 06/18/2018 04/02/2018  Glucose 70 - 99 mg/dL 88 562(Z) 308(M)  BUN 6 - 20 mg/dL 12 14 12   Creatinine 0.61 - 1.24 mg/dL 5.78 4.69  Sodium 135 - 145 mmol/L 139 140 137  Potassium 3.5 - 5.1 mmol/L 3.8 4.5 4.4  Chloride 98 - 111 mmol/L 105 107 108  CO2 22 - 32 mmol/L 23 19(L) 20  Calcium 8.9 - 10.3 mg/dL 9.2 6.29 52.8  Total Protein 6.5 - 8.1 g/dL 7.5 41.3) 7.9  Total Bilirubin 0.3 - 1.2 mg/dL 0.3 0.4 0.4  Alkaline Phos 38 - 126 U/L 65 - -  AST 15 - 41 U/L 54(H) 26 35  ALT 0 - 44 U/L 45(H) 24 53(H)   CBC Latest Ref Rng & Units 05/17/2021 06/27/2016 12/17/2015  WBC 4.0 - 10.5 K/uL 7.9 11.3(H) 7.2  Hemoglobin 13.0 - 17.0 g/dL 12/19/2015 01.0 12.9(L)  Hematocrit 39.0 - 52.0 % 40.8 39.0 38.6(L)  Platelets 150 - 400 K/uL 243 248.0 231.0    Lipid Panel Recent Labs    08/06/20 1017  CHOL 187  TRIG 208*  LDLCALC 110*  HDL 41  CHOLHDL 4.6   Lipid Panel     Component Value Date/Time   CHOL 187 08/06/2020 1017   TRIG 208 (H) 08/06/2020 1017   HDL 41 08/06/2020 1017   CHOLHDL 4.6 08/06/2020 1017   CHOLHDL 6 04/02/2018 1551   VLDL 68.2 (H) 04/02/2018 1551   LDLCALC 110 (H) 08/06/2020 1017   LDLDIRECT 120.0 04/02/2018 1551   LABVLDL 36 08/06/2020 1017     HEMOGLOBIN A1C Lab Results  Component Value Date   HGBA1C 7.4 (A) 05/14/2021   TSH Recent Labs    09/14/20 1535 12/11/20 1455 01/18/21 1549  TSH 0.02* <0.01* <0.01*  01/18/2021: Free T4 1.13, normal and free T3 3.5, normal.  Component Ref Range & Units 05/17/2021  Total CK 49 - 397 U/L 562 High       External labs:   Labs 03/14/2021:  Total cholesterol: 226, triglycerides: 41, LDL 128.  Non-HDL cholesterol 185.  Total CK3 124.  Labs 05/01/2021: Total CK 429.   CPK 925 on 04/26/2021.  Labs 12/22/2018:  Total cholesterol 220, triglycerides 359, HDL 41, LDL 145.  Medications and allergies   Allergies  Allergen Reactions   Divalproex Sodium Other (See Comments)    unknown    Methylphenidate Hcl     Aggravate bipolar   Oxycodone Hcl  REACTION: hallucinations   Ozempic (0.25 Or 0.5 Mg-Dose) [Semaglutide(0.25 Or 0.5mg -Dos)] Nausea Only   Paroxetine     Aggravate bipolar disorder   Rosuvastatin Other (See Comments)    Myopathy -muscle cramps, elevated CK    Medication prior to this encounter:   Outpatient Medications Prior to Visit  Medication Sig Dispense Refill   Blood Glucose Monitoring Suppl (FREESTYLE LITE) DEVI Use as instructed to check sugar 4 times daily 1 each 0   glucose blood (FREESTYLE LITE) test strip Use as instructed to check sugar 4 times daily 400 each 3   hydrOXYzine (VISTARIL) 25 MG capsule TAKE 1 CAPSULE (25 MG TOTAL) BY MOUTH EVERY 6 (SIX) HOURS AS NEEDED. 360 capsule 0   insulin aspart (NOVOLOG FLEXPEN) 100 UNIT/ML FlexPen Inject 35 Units into the skin 3 (three) times daily with meals. 60 mL 3   Insulin Pen Needle (BD PEN NEEDLE NANO U/F) 32G X 4 MM MISC USE 4 TIMES DAILY WITH INSULIN PENS 400 each 3   LANTUS SOLOSTAR 100 UNIT/ML Solostar Pen Inject 54 Units into the skin daily. 60 mL 3   lurasidone (LATUDA) 80 MG TABS tablet Take 1 tablet (80 mg total) by mouth daily. 90 tablet 0   metFORMIN (GLUCOPHAGE-XR) 500 MG 24 hr tablet TAKE 4 TABLETS (2,000 MG TOTAL) BY MOUTH DAILY WITH SUPPER. 360 tablet 2   OneTouch Delica Lancets 30G MISC 1 each by Does not apply route 4 (four) times daily. Use Onetouch Delica lancets to check blood sugar 4 times daily. DX:E11.65 400 each 11   valsartan (DIOVAN) 160 MG tablet Take 160 mg by mouth daily.     No facility-administered medications prior to visit.    Medication list after today's encounter   Current Outpatient Medications  Medication Instructions   Blood Glucose Monitoring Suppl (FREESTYLE LITE) DEVI Use as instructed to check sugar 4 times daily   glucose blood (FREESTYLE LITE) test strip Use as instructed to check sugar 4 times daily   hydrOXYzine (VISTARIL) 25 mg, Oral, Every 6 hours PRN    Insulin Pen Needle (BD PEN NEEDLE NANO U/F) 32G X 4 MM MISC USE 4 TIMES DAILY WITH INSULIN PENS   Lantus SoloStar 54 Units, Subcutaneous, Daily   lurasidone (LATUDA) 80 mg, Oral, Daily   metFORMIN (GLUCOPHAGE-XR) 2,000 mg, Oral, Daily with supper   NovoLOG FlexPen 35 Units, Subcutaneous, 3 times daily with meals   OneTouch Delica Lancets 30G MISC 1 each, Does not apply, 4 times daily, Use Onetouch Delica lancets to check blood sugar 4 times daily. DX:E11.65   valsartan (DIOVAN) 160 mg, Oral, Daily    Radiology:   No results found.  Cardiac Studies:   NA  EKG:   EKG 06/19/2021: Normal sinus rhythm at rate of 75 bpm, normal axis.  Poor R progression, probably normal variant.  No evidence of ischemia, normal EKG.   Assessment     ICD-10-CM   1. Chest tightness  R07.89 EKG 12-Lead    PCV ECHOCARDIOGRAM COMPLETE    PCV MYOCARDIAL PERFUSION WO LEXISCAN    CT CARDIAC SCORING ([facility] LOCATIONS ONLY)    2. Abnormal CPK  R74.8     3. 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 Type 2 diabetes mellitus with hyperglycemia, with long-term current use of insulin (HCC)  E11.65 PCV MYOCARDIAL PERFUSION WO LEXISCAN   Z79.4 CT CARDIAC SCORING ([facility] LOCATIONS ONLY)  There are no discontinued medications.  No orders of the defined types were placed in this encounter.  Orders Placed This Encounter  Procedures   CT CARDIAC SCORING ([facility] LOCATIONS ONLY)    230 / no spinal stimulator, body injector or glucose monitor / no needs / self pay, $99 @ tos - pt aware Epic order/ miriam w pt No to COVID Pt aware of $75 no-show fee. 06/19/2021 pt aware no caffeine 24 hours prior, no heavy or strenuous exercise 6 hours prior/ miriam    Standing Status:   Future    Standing Expiration Date:   08/19/2021    Order Specific Question:   Preferred imaging location?    Answer:   [facility]   PCV MYOCARDIAL PERFUSION WO LEXISCAN    Standing Status:   Future    Standing  Expiration Date:   08/19/2021   EKG 12-Lead   PCV ECHOCARDIOGRAM COMPLETE    Standing Status:   Future    Standing Expiration Date:   06/19/2022   Recommendations:   ELIYOHU CLASS is a 45 y.o. Caucasian male patient with bipolar disorder, hypertension, mixed hyperlipidemia, type 2 diabetes and young which is uncontrolled, obesity referred to me for evaluation of hyperlipidemia and management of chest tightness, patient recently has had elevated CPK enzymes being on fenofibric acid.  Patient has had had what appears to be subclinical hyperthyroidism with markedly reduced TSH and normal T3 and T4.  He also follows Dr. Wyonia Hough for diabetes.  Chest pain symptoms are very atypical and features are more suggestive of musculoskeletal etiology.  However he does have significant risk factors including hypertension, hyperlipidemia and diabetes mellitus which is very much uncontrolled.  I will set him up for a exercise nuclear stress test and we will also perform an echocardiogram to evaluate any structural heart disease.  There is no clinical evidence of heart failure.  I will also obtain coronary calcium score for further cardiac risk stratification.  With regard to elevated CK enzymes, patient states that he was on fenofibric acid for years and had no problems however however Zetia was added on and then developed severe muscle aches and pains and when his thyroid started acting up he developed severe leg pain and was evaluated and was found to have markedly elevated CK enzymes.  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  Depending upon his coronary calcium score I will make further recommendations.  I did not make any changes to his medical therapy for now.  I have discussed with him regarding weight loss as well and gave him positive reinforcement.  I will see him back in 4 weeks for follow-up.    Yates Decamp, MD, [facility] 06/19/2021, 5:29 PM Office:  [phone]

## 2021-06-20 ENCOUNTER — Telehealth: Payer: Self-pay | Admitting: Psychiatry

## 2021-06-20 NOTE — Telephone Encounter (Signed)
Pt left a message stating that he is sleeping 16 hours a day and has stomach pains and cramps. He wants to know is this from the latuda. Please give him a call at [phone]

## 2021-06-21 ENCOUNTER — Other Ambulatory Visit: Payer: Self-pay | Admitting: Psychiatry

## 2021-06-21 DIAGNOSIS — F3110 Bipolar disorder, current episode manic without psychotic features, unspecified: Secondary | ICD-10-CM

## 2021-06-21 MED ORDER — LURASIDONE HCL 60 MG PO TABS: 60.0000 mg | ORAL_TABLET | Freq: Every day | ORAL | 1 refills | Status: DC

## 2021-06-21 NOTE — Telephone Encounter (Signed)
Pt stated the cramps have spread to his arms and legs.He has been on latuda 80 mg qd for awhile now.He stated he is also going to see a cardiologist due to heart spasms.

## 2021-06-21 NOTE — Telephone Encounter (Signed)
The cramps are not likely related to Jordan.  Usually it is related to dehydration although I see from chart review that he had elevated creatinine kinase from a statin and that was probably causing some of his problem.  He needs to make sure he drinks lots of water.  Especially because he is diabetic and that will make him prone to dehydration if he is not controlling his blood sugar.  However the sleepiness might be related to Jordan.  He had previously been on 120 mg daily and we reduced it to 80 mg daily and if he is done okay with his mood we can try reducing it 1 more time to 60 mg.  I will send in a prescription for the 60 mg tablet and he can reduce it if he would like to see if the sleepiness is related to Jordan.  If Kasandra Knudsen is contributing to sleepiness it will get better if he reduces the dose.  If the sleepiness does not get better with the dosage reduction then Latuda is not the cause

## 2021-06-21 NOTE — Telephone Encounter (Signed)
Pt will like to try the 60 mg dose

## 2021-07-09 ENCOUNTER — Ambulatory Visit
Admission: RE | Admit: 2021-07-09 | Discharge: 2021-07-09 | Disposition: A | Discharge status: Home / Self Care | Payer: No Typology Code available for payment source | Source: Ambulatory Visit | Attending: Cardiology | Admitting: Cardiology

## 2021-07-09 DIAGNOSIS — Z794 Long term (current) use of insulin: Secondary | ICD-10-CM

## 2021-07-09 DIAGNOSIS — R0789 Other chest pain: Secondary | ICD-10-CM

## 2021-07-09 DIAGNOSIS — E1165 Type 2 diabetes mellitus with hyperglycemia: Secondary | ICD-10-CM

## 2021-07-09 NOTE — Progress Notes (Signed)
Coronary calcium score 07/09/2021: Coronary calcium score of 0.  Ascending and descending thoracic aorta within normal limits.  No significant extracardiac abnormality.

## 2021-07-17 ENCOUNTER — Ambulatory Visit: Payer: 59

## 2021-07-17 ENCOUNTER — Other Ambulatory Visit: Payer: Self-pay

## 2021-07-17 DIAGNOSIS — R0789 Other chest pain: Secondary | ICD-10-CM

## 2021-07-17 DIAGNOSIS — E1165 Type 2 diabetes mellitus with hyperglycemia: Secondary | ICD-10-CM

## 2021-07-29 ENCOUNTER — Ambulatory Visit: Payer: 59 | Admitting: Cardiology

## 2021-07-29 ENCOUNTER — Encounter: Payer: Self-pay | Admitting: Cardiology

## 2021-07-29 ENCOUNTER — Other Ambulatory Visit: Payer: Self-pay

## 2021-07-29 VITALS — BP 136/80 | HR 84 | Temp 98.2°F | Resp 17 | Ht 69.0 in | Wt 233.4 lb

## 2021-07-29 DIAGNOSIS — E7800 Pure hypercholesterolemia, unspecified: Secondary | ICD-10-CM

## 2021-07-29 DIAGNOSIS — G720 Drug-induced myopathy: Secondary | ICD-10-CM

## 2021-07-29 DIAGNOSIS — R0789 Other chest pain: Secondary | ICD-10-CM

## 2021-07-29 MED ORDER — REPATHA 140 MG/ML ~~LOC~~ SOSY: 1.0000 "pen " | PREFILLED_SYRINGE | SUBCUTANEOUS | 3 refills | Status: DC

## 2021-07-29 MED ORDER — EVOLOCUMAB 140 MG/ML ~~LOC~~ SOAJ: 140.0000 mg | Freq: Once | SUBCUTANEOUS | Status: DC

## 2021-07-29 NOTE — Progress Notes (Addendum)
Primary Physician/Referring:  Lewis Moccasin, MD  Patient ID: Cody Short, male    DOB: [birth year], 45 y.o.   MRN: [id number]  Chief Complaint  Patient presents with   Chest Pain   Hyperlipidemia   Follow-up    4 weeks   Results   HPI:    Cody Short  is a 45 y.o. Caucasian male patient with bipolar disorder, hypertension, mixed hyperlipidemia, type 2 diabetes in young which is uncontrolled, obesity, statin intolerance and was on Zetia and fenofibric acid but developed myalgias and elevated CK in July 2022 and fenofibric acid was discontinued. He quit smoking cigarettes in 2010, has now been concerned about cardiac issues and he is trying to lose weight and change his diet.  Patient has had had what appears to be subclinical hyperthyroidism with markedly reduced TSH and normal T3 and T4.  He also follows Dr. Wyonia Hough for diabetes, it was felt that his hypothyroidism was related to thyroiditis which is probably self-limiting.  In view of multiple cardiovascular risk factors, he underwent coronary calcium score, nuclear stress test and echocardiogram and presents for follow-up.  He continues to have spasms in his chest, unrelated to exertion activity.  He is accompanied by his father today.   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 Recurs if cannot walk for a week.      TOBACCO ABUSE, HX OF 07/31/2009   Past Surgical History:  Procedure Laterality Date   pilar cystectomy     Family History  Problem Relation Age of Onset   Diabetes Mother    Breast cancer Mother        was told not hereditary   Cirrhosis Mother        fatty liver   Diabetes Father    Melanoma Maternal Uncle    Colon cancer Neg Hx    Stomach  cancer Neg Hx    Pancreatitis Neg Hx    Heart disease Neg Hx    Kidney disease Neg Hx    Liver disease Neg Hx     Social History   Tobacco Use   Smoking status: Former    Packs/day: 2.50    Years: 10.00    Pack years: 25.00    Types: Cigarettes    Quit date: 10/27/2006    Years since quitting: 14.7   Smokeless tobacco: Never  Substance Use Topics   Alcohol use: No    Alcohol/week: 0.0 standard drinks    Comment: none at all   Marital Status: Single  ROS  Review of Systems  Constitutional: Positive for weight gain.  Cardiovascular:  Positive for chest pain. Negative for dyspnea on exertion and leg swelling.  Gastrointestinal:  Negative for melena.  Psychiatric/Behavioral:  Positive for depression. The patient is nervous/anxious.   Objective  Blood pressure 136/80, pulse 84, temperature 98.2 F (36.8 C), temperature source Temporal, resp. rate 17, height 5\' 9"  (1.753 m), weight 233 lb 6.4 oz (105.9 kg), SpO2 98 %. Body mass index is 34.47 kg/m.  Vitals with BMI 07/29/2021 06/19/2021 05/17/2021  Height 5\' 9"  5\' 9"  -  Weight 233 lbs 6 oz 232 lbs -  BMI 34.45 34.24 -  Systolic 136 121 05/19/2021  Diastolic 80 83 82  Pulse 84 84 74  Some encounter information is confidential and restricted. Go to Review Flowsheets activity to see all data.    Physical Exam Constitutional:      Appearance: He is obese.  Neck:     Vascular: No carotid bruit or JVD.  Cardiovascular:     Rate and Rhythm: Normal rate and regular rhythm.     Pulses: Intact distal pulses.     Heart sounds: Normal heart sounds. No murmur heard.   No gallop.  Pulmonary:     Effort: Pulmonary effort is normal.     Breath sounds: Normal breath sounds.  Abdominal:     General: Bowel sounds are normal.     Palpations: Abdomen is soft.  Musculoskeletal:        General: No swelling.   Laboratory examination:   Recent Labs    05/17/21 1952  NA 139  K 3.8  CL 105  CO2 23  GLUCOSE 88  BUN 12  CREATININE 1.10   CALCIUM 9.2  GFRNONAA >60   CrCl cannot be calculated (Patient's most recent lab result is older than the maximum 21 days allowed.).  CMP Latest Ref Rng & Units 05/17/2021 06/18/2018 04/02/2018  Glucose 70 - 99 mg/dL 88 063(K) 160(F)  BUN 6 - 20 mg/dL 12 14 12   Creatinine 0.61 - 1.24 mg/dL 0.93 2.35  Sodium 135 - 145 mmol/L 139 140 137  Potassium 3.5 - 5.1 mmol/L 3.8 4.5 4.4  Chloride 98 - 111 mmol/L 105 107 108  CO2 22 - 32 mmol/L 23 19(L) 20  Calcium 8.9 - 10.3 mg/dL 9.2 5.73 22.0  Total Protein 6.5 - 8.1 g/dL 7.5 25.4) 7.9  Total Bilirubin 0.3 - 1.2 mg/dL 0.3 0.4 0.4  Alkaline Phos 38 - 126 U/L 65 - -  AST 15 - 41 U/L 54(H) 26 35  ALT 0 - 44 U/L 45(H) 24 53(H)   CBC Latest Ref Rng & Units 05/17/2021 06/27/2016 12/17/2015  WBC 4.0 - 10.5 K/uL 7.9 11.3(H) 7.2  Hemoglobin 13.0 - 17.0 g/dL 12/19/2015 62.3 12.9(L)  Hematocrit 39.0 - 52.0 % 40.8 39.0 38.6(L)  Platelets 150 - 400 K/uL 243 248.0 231.0    Lipid Panel Recent Labs    08/06/20 1017  CHOL 187  TRIG 208*  LDLCALC 110*  HDL 41  CHOLHDL 4.6   Lipid Panel     Component Value Date/Time   CHOL 187 08/06/2020 1017   TRIG 208 (H) 08/06/2020 1017   HDL 41 08/06/2020 1017   CHOLHDL 4.6 08/06/2020 1017   CHOLHDL 6 04/02/2018 1551   VLDL 68.2 (H) 04/02/2018 1551   LDLCALC 110 (H) 08/06/2020 1017   LDLDIRECT 120.0 04/02/2018 1551   LABVLDL 36 08/06/2020 1017     HEMOGLOBIN A1C Lab Results  Component Value Date   HGBA1C 7.4 (A) 05/14/2021   TSH Recent Labs    09/14/20 1535 12/11/20 1455 01/18/21 1549  TSH 0.02* <0.01* <0.01*  01/18/2021: Free T4 1.13, normal and free T3 3.5, normal.  Component Ref Range & Units 05/17/2021  Total CK 49 - 397 U/L 562 High       External labs:   Labs 03/14/2021:  Total cholesterol: 226, triglycerides: 41, LDL 128.  Non-HDL cholesterol 185.  Total CK3 124.  Labs 05/01/2021: Total CK 429.   CPK 925 on 04/26/2021.  Labs 12/22/2018:  Total cholesterol 220, triglycerides 359, HDL  41, LDL 145.  Medications and allergies   Allergies  Allergen Reactions   Tricor [  Fenofibrate] Other (See Comments)    Myopathy   Divalproex Sodium Other (See Comments)    unknown   Methylphenidate Hcl     Aggravate bipolar   Oxycodone Hcl     REACTION: hallucinations   Ozempic (0.25 Or 0.5 Mg-Dose) [Semaglutide(0.25 Or 0.5mg -Dos)] Nausea Only   Paroxetine     Aggravate bipolar disorder   Rosuvastatin Other (See Comments)    Myopathy -muscle cramps, elevated CK    Medication prior to this encounter:   Outpatient Medications Prior to Visit  Medication Sig Dispense Refill   Blood Glucose Monitoring Suppl (FREESTYLE LITE) DEVI Use as instructed to check sugar 4 times daily 1 each 0   glucose blood (FREESTYLE LITE) test strip Use as instructed to check sugar 4 times daily 400 each 3   hydrOXYzine (VISTARIL) 25 MG capsule TAKE 1 CAPSULE (25 MG TOTAL) BY MOUTH EVERY 6 (SIX) HOURS AS NEEDED. 360 capsule 0   insulin aspart (NOVOLOG FLEXPEN) 100 UNIT/ML FlexPen Inject 35 Units into the skin 3 (three) times daily with meals. 60 mL 3   Insulin Pen Needle (BD PEN NEEDLE NANO U/F) 32G X 4 MM MISC USE 4 TIMES DAILY WITH INSULIN PENS 400 each 3   LANTUS SOLOSTAR 100 UNIT/ML Solostar Pen Inject 54 Units into the skin daily. 60 mL 3   Lurasidone HCl (LATUDA) 60 MG TABS Take 1 tablet (60 mg total) by mouth daily. 30 tablet 1   metFORMIN (GLUCOPHAGE-XR) 500 MG 24 hr tablet TAKE 4 TABLETS (2,000 MG TOTAL) BY MOUTH DAILY WITH SUPPER. 360 tablet 2   OneTouch Delica Lancets 30G MISC 1 each by Does not apply route 4 (four) times daily. Use Onetouch Delica lancets to check blood sugar 4 times daily. DX:E11.65 400 each 11   valsartan (DIOVAN) 160 MG tablet Take 160 mg by mouth daily.     No facility-administered medications prior to visit.    Medication list after today's encounter   Current Outpatient Medications  Medication Instructions   Blood Glucose Monitoring Suppl (FREESTYLE LITE) DEVI Use as  instructed to check sugar 4 times daily   Evolocumab (REPATHA) 140 MG/ML SOSY 1 pen, Subcutaneous, Every 14 days   glucose blood (FREESTYLE LITE) test strip Use as instructed to check sugar 4 times daily   hydrOXYzine (VISTARIL) 25 mg, Oral, Every 6 hours PRN   Insulin Pen Needle (BD PEN NEEDLE NANO U/F) 32G X 4 MM MISC USE 4 TIMES DAILY WITH INSULIN PENS   Lantus SoloStar 54 Units, Subcutaneous, Daily   Lurasidone HCl (LATUDA) 60 mg, Oral, Daily   metFORMIN (GLUCOPHAGE-XR) 2,000 mg, Oral, Daily with supper   NovoLOG FlexPen 35 Units, Subcutaneous, 3 times daily with meals   OneTouch Delica Lancets 30G MISC 1 each, Does not apply, 4 times daily, Use Onetouch Delica lancets to check blood sugar 4 times daily. DX:E11.65   valsartan (DIOVAN) 160 mg, Oral, Daily    Radiology:   No results found.  Cardiac Studies:   Coronary calcium score 07/09/2021: Coronary calcium score of 0.  Ascending and descending thoracic aorta within normal limits.  No significant extracardiac abnormality.  PCV ECHOCARDIOGRAM COMPLETE 07/17/2021  Narrative Echocardiogram 07/17/2021: Left ventricle cavity is normal in size and wall thickness. Normal global wall motion. Normal LV systolic function with visual EF 50-55%. Doppler evidence of grade I (impaired) diastolic dysfunction, normal LAP. Thin interatrial septum without 2D or color Doppler evidence of shunting. Mild tricuspid regurgitation. Estimated pulmonary artery systolic pressure 31 mmHg.  PCV MYOCARDIAL PERFUSION WO LEXISCAN 07/17/2021  Narrative Exercise Sestamibi stress test 07/17/2021: Exercise nuclear stress test was performed using Bruce protocol. Patient reached 10 METS, and 92% of age predicted maximum heart rate. Exercise capacity was good. No chest pain reported. Heart rate and hemodynamic response were normal. Stress EKG revealed no ischemic changes. Normal myocardial perfusion. Stress LVEF 66%. Low risk study.    EKG:   EKG 06/19/2021:  Normal sinus rhythm at rate of 75 bpm, normal axis.  Poor R progression, probably normal variant.  No evidence of ischemia, normal EKG.   Assessment     ICD-10-CM   1. Chest pain, musculoskeletal  R07.89     2. Hypercholesteremia  E78.00 Evolocumab (REPATHA) 140 MG/ML SOSY    Evolocumab SOAJ 140 mg    Lipid Panel With LDL/HDL Ratio    Lipid Panel With LDL/HDL Ratio    3. Statin myopathy  G72.0 Evolocumab (REPATHA) 140 MG/ML SOSY   T46.6X5A Evolocumab SOAJ 140 mg     There are no discontinued medications.  Meds ordered this encounter  Medications   Evolocumab (REPATHA) 140 MG/ML SOSY    Sig: Inject 1 pen into the skin every 14 (fourteen) days.    Dispense:  6 mL    Refill:  3   Evolocumab SOAJ 140 mg    Orders Placed This Encounter  Procedures   Lipid Panel With LDL/HDL Ratio   Lipid Panel With LDL/HDL Ratio    Standing Status:   Future    Standing Expiration Date:   07/29/2022   Recommendations:   Cody Short is a 45 y.o. Caucasian male patient with bipolar disorder, hypertension, mixed hyperlipidemia, type 2 diabetes in young which is uncontrolled, obesity, statin intolerance and was on Zetia and fenofibric acid but developed myalgias and elevated CK in July 2022 (Zetia was added prior to elevation in CK) and fenofibric acid was discontinued.  Patient has had had what appears to be subclinical hyperthyroidism with markedly reduced TSH and normal T3 and T4.  He also follows Dr. Wyonia Hough for diabetes, it was felt that his hypothyroidism was related to thyroiditis which is probably self-limiting.  In view of multiple cardiovascular risk factors, he underwent coronary calcium score, nuclear stress test and echocardiogram and presents for follow-up.  Chest pain symptoms clearly appear to be musculoskeletal.  Low risk stress test and echocardiogram discussed with the patient and his father at the bedside.  In view of his cardiovascular risk being extremely high, statin  intolerance, I would like to obtain a baseline lipid profile testing, sample of Repatha was given to the patient today and he will wait until he gets labs done prior to injecting it.  With regard to elevated serum amylase while he was in the hospital with a ED visit during severe myalgias, suspect he may have chronic pancreatitis.  We could certainly consider CT scan of the abdomen to look for pancreatic calcification, will leave it to his PCP.  I would like to see him back in 6 weeks for follow-up and I will repeat lipids prior to his office visit.    Yates Decamp, MD, [facility] 07/29/2021, 2:13 PM Office: [phone]

## 2021-07-30 ENCOUNTER — Encounter: Payer: Self-pay | Admitting: Psychiatry

## 2021-07-30 ENCOUNTER — Telehealth (INDEPENDENT_AMBULATORY_CARE_PROVIDER_SITE_OTHER): Payer: 59 | Admitting: Psychiatry

## 2021-07-30 DIAGNOSIS — F5113 Hypersomnia due to other mental disorder: Secondary | ICD-10-CM | POA: Diagnosis not present

## 2021-07-30 DIAGNOSIS — F5105 Insomnia due to other mental disorder: Secondary | ICD-10-CM

## 2021-07-30 DIAGNOSIS — F3110 Bipolar disorder, current episode manic without psychotic features, unspecified: Secondary | ICD-10-CM | POA: Diagnosis not present

## 2021-07-30 DIAGNOSIS — F1211 Cannabis abuse, in remission: Secondary | ICD-10-CM

## 2021-07-30 DIAGNOSIS — F4001 Agoraphobia with panic disorder: Secondary | ICD-10-CM | POA: Diagnosis not present

## 2021-07-30 DIAGNOSIS — L299 Pruritus, unspecified: Secondary | ICD-10-CM

## 2021-07-30 DIAGNOSIS — F411 Generalized anxiety disorder: Secondary | ICD-10-CM

## 2021-07-30 MED ORDER — HYDROXYZINE HCL 10 MG PO TABS: 10.0000 mg | ORAL_TABLET | Freq: Four times a day (QID) | ORAL | 3 refills | Status: DC | PRN

## 2021-07-30 NOTE — Progress Notes (Signed)
Lang Zingg Jensen [id number] [birth year] 45 y.o.   Virtual Visit via Telephone Note  I connected with pt by telephone and verified that I am speaking with the correct person using two identifiers.   I discussed the limitations, risks, security and privacy concerns of performing an evaluation and management service by telephone and the availability of in person appointments. I also discussed with the patient that there may be a patient responsible charge related to this service. The patient expressed understanding and agreed to proceed.  I discussed the assessment and treatment plan with the patient. The patient was provided an opportunity to ask questions and all were answered. The patient agreed with the plan and demonstrated an understanding of the instructions.   The patient was advised to call back or seek an in-person evaluation if the symptoms worsen or if the condition fails to improve as anticipated.  I provided 30 minutes of non-face-to-face time during this encounter. The patient was located at home and the provider was located office.  Session started at 330 and ended at 400   Subjective:   Patient ID:  Cody Short is a 45 y.o. (DOB [birth year]) male.  Chief Complaint:  Chief Complaint  Patient presents with   Follow-up   Bipolar I disorder   Medication Problem   Other    itching     Emir P Hewett presents to the office today for follow-up of several psychiatric diagnoses ...    seen August 2020.  For obsessive anxiety  Switched Lexapro 5 mg daily on May 18.  But he stated it made him hungry and so it was stopped in August.  He continued on Saphris 10 mg nightly, lithium 1200 mg daily, propranolol as needed tremor.  Lithium level was ordered.  He called Sept saying he was manic.  Had a lithium level 0.7 and so dose was increased to 1500 mg daily.    Last seen December 21, 2019.  He had some ongoing manic symptoms and Saphris was increased to 15  mg nightly.  02/07/2020 appointment with the following noted: He has made multiple phone calls to the office since that date.  He decided he wanted to stop lithium because of urinary problems.  He was warned this could lead to mania but he wanted to pursue it anyway.  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  Cody Short had been having bedwetting episodes which she attributed to the lithium.  He has not consistently taken Saphris 15 mg at bedtime since the office visit in February. He completely stopped lithium January 30, 2020 U frequency is much better and less thirsty.  Bedwetting resolved.  Reduced BM to 1-2 times daily.  Gassy.  Glucose is high.  B is hospitalized with unknown disease.  Insulin adjusted. Mental status is best it's ever been.  Less mania.  Less obsessing.  No worsening mania. Is taking Saphris 15 mg HS> Plan no med changes.  04/10/2020 appointment with the following noted: No longer on lithium.  Still frequent urination and thirst DT DM poorly controlled despite meds.  Endo says little else to take bc history of pancreatitis.  Says he goes to urinate q 15 mins. Plan: Wean Saphris in crossover to loxapine 150 mg HS over 2 weeks bc he and endocrinologist believe it's likely Saphris is cause of the inability to get his glucose to acceptable levels.   06/07/20 appt with the following noted: He's called a couple  of times CO sleepiness and reduced loxapine to 50 mg HS. Started Vegan diet and BS is better 4 weeks ago.  Thinks glucose is better off the Saphris. Lost 6 #.   CO sleepiness and sleep 16 hours   He thinks it also drove up pulse and blood pressure. Asks about taking Saphris 10 and a second medication. Mood has been fine.   Plan: He wants to restart Saphris 10 mg HS and use a 2nd mood stabilizer. Start Saphris and reduce Loxapine  To 10 mg at night DT prior failure of 10 Mg Saphris with lithium.  06/27/2020 phone call from  patient: Pt called to report Loxapine & Saphris is causing his BP to be very high and Saphris is causing blood sugar to be high. Apt 10/18. Need changes before then. Call back @ [phone]  MD response: He had wanted to go back on Saphris and reduce loxapine which we did.  Tell him to stop the Saphris and increase loxapine to 20 mg nightly.  He did not tolerate 50 mg of loxapine very well. Nurse TC withpt: Spoke with Cody Short and he will stop the Saphris, but feels the increase in the Loxapine to 20 mg will increase his blood sugars more. Explained that with coming off the Saphris he needs to increase it a bit or he will not be stable with his moods. Advised him to stop the Saphris and would check with Dr. Jennelle Human about any other recommendations.   07/17/20 appt with the following noted: Doing terrible.  Needs melatonin to sleep.  Claims loxapine is elevating BP, pulse, TG, cholesterol, glucose.  Claimed Saphris did it too.   Sees PCP at end of October.   Started Vegan diet about 6 weeks ago and exercising.   A/P: There are no available mood stabilizers that do not have a warning of elevating blood sugar and cholesterol.  He has tried all the mood stabilizers that do not have this warning.  All of the available mood stabilizers left that have any effectiveness are antipsychotics and the FDA has required a class warning on all antipsychotics about blood sugar and cholesterol effects.  This is true despite the fact that not all antipsychotics are equal at increasing these risks.  The patient's reading of these potential side effects is complicating finding an effective treatment.  The only way to prove whether or not these meds are elevating his blood sugar and cholesterol is to stop the medications for a period of time.  This exposes him to manic risk but there is no chance of finding an adequate mood stabilizer that will satisfy him until we can prove whether or not these meds are in fact affecting his blood  sugar, cholesterol, blood pressure, and pulse. Therefore he will stop the loxapine which is at a low dose. We will check labs in 3 weeks.  We will attempt follow-up in 4 weeks.  If he gets manic he will let us know.   08/13/2020 appointment with the following noted: Blood sugar is pretty much the same off the loxapine but BP and pulse improved from 110 to 70 range.  But had started BP med about mid September. Started counselor Illene Labrador at [facility] and started nutritionsist.  Has changed and restricted diet. Sees PCP 07/24/20.  Endo is managing DM.   Mood has been really good.  Counseling helped the anxiety.  Getting 10 hours of sleep.  PCP suggested changing time going to bed earlier and it's  helping.   Sometimes has racing thoughts and gets comments from others while playing video games that he needs to take a break. Patient reports stable mood and deniesr irritable moods.  Denies appetite disturbance.  Patient reports that energy and motivation have been good.  Patient denies any difficulty with concentration.  Patient denies any suicidal ideation.  Admits he's easily stressed.   10/09/20 appt with following noted: Getting manic as hell and depressed as hell. Talking too much and obsessing over things.  Talking too loud and doing things extremely fast.  Only depressed a couple of days ago.  Manic sx are brief and not all the time. Sleep 7-8 hours and sleep schedule is more normal. Stayed on Caplyta and pleased overall with SE. BS is better but hypertension is ongoing. Low TSH. Still counseling. No delusions hallucinations since off drugs. Give samples of Caplyta 42 mg once daily. Prefer no change befor ethe holidays bc he is not continuously manic or depressed. Likely switch to Jordan after the holidays.  11/19/2020 appointment with the following noted: Best I've ever been except some controllable manic.  Able to do things fast and easily.  Anxiety medicine and  counseling has helped.  Better function.  No SE. Not depressed in a while.  But not sure the Capylta helps mania.   Lost from 230# to 189# with diet and exercise over a long period.   Sleep 4-8 hours, but not that I can't sleep but don't feel like sleeping.  Not itching any more.  I'm a a whole new person.   I can't keep up the mania.  Driving my parents and friends crazy. Blood sugar better and off insulin.  Only taking metformin. Can't afford Caplyta. So feels he must change. Hydroxyzine really helped anxiety. Plan: DC Caplyta DT mania.  switch to Latuda 40 then 80 mg with food.  Pick up samples.  12/03/2020 phone call from parents:Parents called and want dr. Jennelle Human to know that Cody Short is maniac and he is having physical aggression and all he does is play video games 24 hours a day and is not sleeping. Parents feel like Cody Short is not giving you the whole picture and needs an adjustment to his medicine. MD response:atient acknowledged that his appointment on January 24 that he was manic while taking Caplyta.  He also acknowledged that his family saw him as being manic.  We discontinued Caplyta and switched him to Jordan.  If he made the switch and increase the dose in a timely manner he should have been on 80 mg daily for a week now.  His parents are complaining that he is still manic.  Provided he is tolerating the Latuda reasonably well have him increase to 120 mg daily.  He can take 1-1/2 of the 80 mg tablets until he runs out and then we can send in a refill for 120 mg daily.  Remind him he needs to take that with at least 350 cal. Phone call with patient from nurse: Patient rtc and he did report he's doing great on the medication but is still having mania. Reports staying up all night playing video games then asleep about 5 am and back up at noon. He is taking medication with 350 calories also. Advised him to increase to 120 mg Latuda daily. He reports having only 40 mg tablets at home so instructed him  to take 3 tablets and I would get more samples out for him to pick up tomorrow. He agreed and  verbalized understanding.  12/07/2020 phone call: Patient called again about his Latuda. He states that it makes him really hungry after he takes it, he takes a nap and wakes up hungry again. He would like to know would it be okay to take before bed instead. MD response: He is unlikely to comply with recommendations about routine sleep and wake times.  He is in a habit of playing video games late into the night.  If he prefers he can split the dosage of the Latuda between breakfast and another meal.  But it is very important that he get the full dose in every day and take it with 350 cal. Nursing phone call with patient:Rtc to patient and he reports he has to take it all at hs because it makes him sleepy, I advised him to continue that regimen. He also reports not sleeping well but he's been drinking caffeine later in the day. Advised him to not drink any caffeine past 2-3 pm. He agreed. He reports his blood sugars have been elevated some, not as bad but it is up some.   12/20/2020 appointment with the following noted: Mania tremendously better but occ anger outbursts anger.  6 hours of sleep with melatonin, hydroxyzine and Latuda 120 mg for 2 weeks.  Staying up all night playing video games. Kasandra Knudsen does make him hungry right after he takes it so takes it before bedtime.  Taking with food. Hyperactivity and loud talking is better.  I can function fast.  Parents see benefit with mania but are concerned about his anger outbursts but he things she takes stress out on him.  Blood sugar is generally good except when eats too much.  Taking it with dinner and HS.   Able to drive around town.  Can cook and clean.  Less ruminating on his past.  Less need for direction.   Counseling is helping at [facility] counseling. Hydroxyzine helped itching and anxiety.  Plan: Continue Latuda 120 mg PM.  Is getting benefit but only  been on it for a couple of weeks.   01/17/2021 appointment with following noted: So so.  Some anger outburts a couple of times.  Eats too much.  Blood sugar is pretty bad.  Up at night playing video games and then sleeps too late in the day.  Takes Latuda at evening meal.  Wants to blame meds for these behaviors. Otherwise mood is "good I guess".  No serious highs or lows.  Can get in arguments with mother when she tries to correct him and may feel guilty afterwards. Takes hydroxyzine q 6 hours because of anxiety and bc dx thyroid inflamed, thyroiditis.  Asks if there's anything to help with overeating. Lost weight from thyroid.   Plan: Continue Latuda 120 mg as he is tolerating it and it appears to be controlling mania better than did the Caplyta and he is tolerating it better.  02/12/2021 appointment with the following noted: Perfectly good with mood and depression.  Needs hydroxyzine for itching and anxiety.  Energy poor with thyroiditis. Will sleep too much too bc gets bored and no physical activity. Varies 9-16 hours daily.  Can nap easily.  Not spacy.   No anger lately. Tolerating Latuda well.   Still eats too many sweets late at night and gets high blood sugars.   Plan: Increase  topiramate 50 mg BID off label for weight loss and disc SE.  He wants to try something.  03/20/2021 appointment with the following noted: Taking  meds.  A little benefit with increase topiramate. Mood is perfectly fine without mania nor depression. Blood sugar is a bit high. Claims doctor thinks rash could be related to [facility] but he sleeps all the time bc hypothyroidism. Has folliculitis Started treatment for it and it is helping.   Thyroid problem is a wait and see.  Could take a year to get better. Doesn't think topiramate added tiredness. Not taking hydroxyzine much about once daily. Dealing with difficult GM threatening sui if left alone.  Plan: Continue Latuda 120 mg PM.  Is getting benefit.. Better  tolerated than others so far but he does say it makes him hungry but he has chronic impulse control problems including overeating.  05/06/2021 appointment with the following noted: Quit topiramate DT nausea and not helping at all. Rash resolved. Lipids not terrible but a little up.  TSH normalized. HGBA1C 9.3 Not exercising. Not doing much re: work etc other than video games. Not manic lately. Patient reports stable mood and denies depressed or irritable moods.  Patient denies any recent difficulty with anxiety.   Denies appetite disturbance.  Patient reports that energy and motivation have been good.  Patient denies any difficulty with concentration.  Patient denies any suicidal ideation. Energy is better than it was but still sleeping 12 hours daily and up at night playing videos with friends. Plan: Reduce Latuda to 80 mg to reduce excessive sleepiness.  If mania then increase it back up 120 mg PM.  Is getting benefit..  06/20/21 TC:  Pt stated the cramps have spread to his arms and legs.He has been on latuda 80 mg qd for awhile now.He stated he is also going to see a cardiologist due to heart spasms. MD response:  The cramps are not likely related to Jordan.  Usually it is related to dehydration although I see from chart review that he had elevated creatinine kinase from a statin and that was probably causing some of his problem.  He needs to make sure he drinks lots of water.  Especially because he is diabetic and that will make him prone to dehydration if he is not controlling his blood sugar. However the sleepiness might be related to Jordan.  He had previously been on 120 mg daily and we reduced it to 80 mg daily and if he is done okay with his mood we can try reducing it 1 more time to 60 mg.  I will send in a prescription for the 60 mg tablet and he can reduce it if he would like to see if the sleepiness is related to Jordan.  If Kasandra Knudsen is contributing to sleepiness it will get better if he  reduces the dose.  If the sleepiness does not get better with the dosage reduction then Latuda is not the cause Reduce Latuda to 60 mg daily  07/30/2021 appt noted: Not much difference in alertness Mania since reducing Latuda. Hypoactive bc thryroid problems and may be the cause of the cramps per the other doctors. Itches badly without hydroxyzine. Spent $500 on gambling game and adeleted.  Past history of drug induced gambling but no major drugs in 15 years. Still exhausted and sleep all day.  But is taying up at night.  Sleeping 16 hours daily.  Wonders if hydroxyzine 25 is making him tired. Itching worse with stress and stress of GM and B crazy. Asks about bipolar shot. Side sleeper and as far as he knows no excessive snoring.  Works with [employer].  Prior psychiatric medications: risperidone with cognitive side effects, Abilify NR,  Zyprexa SE glucose, Seroquel 600 mg SE wt, perphenazineSE, Saphris 20 partial response but SE increase glucose , haloperidol EPS, Geodon EPS,  Vraylar, loxapine SE, Caplyta NR, Latuda 120 He's prone to EPS. Depakote (Note patient had severe pancreatitis from Depakote.) , Equetro, topiramate nausea Lithium was stopped early April 2021 bc of diabetes insipidus.  N-acetylcysteine with minimal benefit, , Amantadine with vomiting.  Sertraline and lexapro sexual SE,   Propranolol for tremor, lithium 1500,     Review of Systems:  Review of Systems  Constitutional:  Positive for fatigue.  Endocrine: Positive for polydipsia and polyuria.  Genitourinary:  Negative for decreased urine volume and frequency.  Musculoskeletal:  Positive for myalgias.  Skin:  Positive for rash.       Itching resolved with hydroxyzine  Neurological:  Negative for tremors and weakness.  Psychiatric/Behavioral:  Positive for decreased concentration. Negative for agitation, behavioral problems, confusion, dysphoric mood, hallucinations, self-injury, sleep disturbance and suicidal  ideas. The patient is hyperactive.    Medications: I have reviewed the patient's current medications.  Current Outpatient Medications  Medication Sig Dispense Refill   Blood Glucose Monitoring Suppl (FREESTYLE LITE) DEVI Use as instructed to check sugar 4 times daily 1 each 0   glucose blood (FREESTYLE LITE) test strip Use as instructed to check sugar 4 times daily 400 each 3   hydrOXYzine (ATARAX/VISTARIL) 10 MG tablet Take 1-2 tablets (10-20 mg total) by mouth every 6 (six) hours as needed. 240 tablet 3   hydrOXYzine (VISTARIL) 25 MG capsule TAKE 1 CAPSULE (25 MG TOTAL) BY MOUTH EVERY 6 (SIX) HOURS AS NEEDED. 360 capsule 0   insulin aspart (NOVOLOG FLEXPEN) 100 UNIT/ML FlexPen Inject 35 Units into the skin 3 (three) times daily with meals. 60 mL 3   Insulin Pen Needle (BD PEN NEEDLE NANO U/F) 32G X 4 MM MISC USE 4 TIMES DAILY WITH INSULIN PENS 400 each 3   LANTUS SOLOSTAR 100 UNIT/ML Solostar Pen Inject 54 Units into the skin daily. 60 mL 3   Lurasidone HCl (LATUDA) 60 MG TABS Take 1 tablet (60 mg total) by mouth daily. 30 tablet 1   metFORMIN (GLUCOPHAGE-XR) 500 MG 24 hr tablet TAKE 4 TABLETS (2,000 MG TOTAL) BY MOUTH DAILY WITH SUPPER. 360 tablet 2   OneTouch Delica Lancets 30G MISC 1 each by Does not apply route 4 (four) times daily. Use Onetouch Delica lancets to check blood sugar 4 times daily. DX:E11.65 400 each 11   valsartan (DIOVAN) 160 MG tablet Take 160 mg by mouth daily.     Evolocumab (REPATHA) 140 MG/ML SOSY Inject 1 pen into the skin every 14 (fourteen) days. (Patient not taking: Reported on 07/30/2021) 6 mL 3   Current Facility-Administered Medications  Medication Dose Route Frequency Provider Last Rate Last Admin   Evolocumab SOAJ 140 mg  140 mg Subcutaneous Once Yates Decamp, MD        Medication Side Effects: Other: less tremor  Allergies:  Allergies  Allergen Reactions   Tricor [Fenofibrate] Other (See Comments)    Myopathy   Divalproex Sodium Other (See Comments)     unknown   Methylphenidate Hcl     Aggravate bipolar   Oxycodone Hcl     REACTION: hallucinations   Ozempic (0.25 Or 0.5 Mg-Dose) [Semaglutide(0.25 Or 0.5mg -Dos)] Nausea Only   Paroxetine     Aggravate bipolar disorder   Rosuvastatin Other (See Comments)    Myopathy -muscle cramps, elevated  CK    Past Medical History:  Diagnosis Date   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 Recurs if cannot walk for a week.      TOBACCO ABUSE, HX OF 07/31/2009    Family History  Problem Relation Age of Onset   Diabetes Mother    Breast cancer Mother        was told not hereditary   Cirrhosis Mother        fatty liver   Diabetes Father    Melanoma Maternal Uncle    Colon cancer Neg Hx    Stomach cancer Neg Hx    Pancreatitis Neg Hx    Heart disease Neg Hx    Kidney disease Neg Hx    Liver disease Neg Hx     Social History   Socioeconomic History   Marital status: Single    Spouse name: Not on file   Number of children: 0   Years of education: Not on file   Highest education level: Not on file  Occupational History   Not on file  Tobacco Use   Smoking status: Former    Packs/day: 2.50    Years: 10.00    Pack years: 25.00    Types: Cigarettes    Quit date: 10/27/2006    Years since quitting: 14.7   Smokeless tobacco: Never  Vaping Use   Vaping Use: Never used  Substance and Sexual Activity   Alcohol use: No    Alcohol/week: 0.0 standard drinks    Comment: none at all   Drug use: No   Sexual activity: Not on file  Other Topics Concern   Not on file  Social History Narrative   Single. Not dating currently. No kids.    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   Please see review of systems for further details on the patient's review from today.   Objective:   Physical Exam:  There were no vitals taken for this visit.  Physical Exam Neurological:     Mental Status: He is alert and oriented to person, place, and time.     Cranial Nerves: No dysarthria.  Psychiatric:        Attention and Perception: Attention normal.        Mood and Affect: Mood is anxious. Mood is not depressed or elated.        Speech: Speech is not rapid and pressured or slurred.        Behavior: Behavior is not agitated, aggressive or hyperactive. Behavior is cooperative.        Thought Content: Thought content is not paranoid or delusional. Thought content does not include homicidal or suicidal ideation. Thought content does not include homicidal or suicidal plan.  Cognition and Memory: Cognition and memory normal.     Comments: No paranoia.   Insight and judgment fair to poor. Chronically self-preoccupied and hyperthymic style a little better.  Less pressured and less manic.     Lab Review:     Component Value Date/Time   NA 139 05/17/2021 1952   K 3.8 05/17/2021 1952   CL 105 05/17/2021 1952   CO2 23 05/17/2021 1952   GLUCOSE 88 05/17/2021 1952   BUN 12 05/17/2021 1952   CREATININE 1.10 05/17/2021 1952   CREATININE 1.24 06/18/2018 1502   CALCIUM 9.2 05/17/2021 1952   PROT 7.5 05/17/2021 1952   ALBUMIN 4.7 05/17/2021 1952   AST 54 (H) 05/17/2021 1952   ALT 45 (H) 05/17/2021 1952   ALKPHOS 65 05/17/2021 1952   BILITOT 0.3 05/17/2021 1952   GFRNONAA >60 05/17/2021 1952    GFRNONAA 84 04/02/2018 1551   GFRAA 97 04/02/2018 1551       Component Value Date/Time   WBC 7.9 05/17/2021 1952   RBC 4.60 05/17/2021 1952   HGB 13.6 05/17/2021 1952   HCT 40.8 05/17/2021 1952   PLT 243 05/17/2021 1952   MCV 88.7 05/17/2021 1952   MCH 29.6 05/17/2021 1952   MCHC 33.3 05/17/2021 1952   RDW 13.2 05/17/2021 1952   LYMPHSABS 1.9 06/27/2016 1441   MONOABS 0.8 06/27/2016 1441   EOSABS 0.2 06/27/2016 1441   BASOSABS 0.1 06/27/2016 1441    Lithium Lvl  Date Value Ref Range Status  09/09/2019 1.1 0.6 - 1.2 mmol/L Final    12/14/19 lithium level 0.6 (trough 13 hours) , normal BMP and calcium   No results found for: PHENYTOIN, PHENOBARB, VALPROATE, CBMZ   .res Assessment: Plan:    Parris was seen today for follow-up, bipolar i disorder, medication problem and other.  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  Cody Short has had significant disruptive mood and psychotic symptoms and substance abuse over the course of his treatment here.  It has been evident in chronic occupational dysfunction and some relational problems.  He is also been very prone to EPS and elevated blood sugars from atypicals making it difficult to get adequate mood stabilization.   Has a history of severe pancreatitis from Depakote.  He did not have an adequate response to carbamazepine.   lithium was insufficient to control his symptoms in monotherapy and he developed diabetes insipidus.  There are no available mood stabilizers that do not have a warning of elevating blood sugar and cholesterol.  He has tried all the mood  stabilizers that do not have this warning.  All of the available mood stabilizers left that have any effectiveness are antipsychotics and the FDA has required a class warning on all antipsychotics about blood sugar and cholesterol effects.  This is true despite the fact that not all antipsychotics are equal and increasing these risks.  The patient's reading of these potential side effects is complicating finding an effective treatment.  The only way to prove whether or not these meds are elevating his blood sugar and cholesterol is to stop the medications for a period of  time.  This exposes him to manic risk but there is no chance of finding an adequate mood stabilizer that will satisfy him until we can prove whether or not these meds are in fact affecting his blood sugar, cholesterol, blood pressure, and pulse.  In general he has not been able to afford branded medications which further limits options such as the use of Latuda which has a lower metabolic risk than other antipsychotics.  He is also EPS prone which further limits options.  He has been less manic n Latuda 120 mg over 5 months.  Been the best med so far for him.    It is unclear whether that is due to boredom because he is not working or perhaps related to a side effect of Latuda.  Sleepiness is not better with reduction in Latuda from 120 to 60 mg daily.  Reduce Latuda to 80 mg to reduce excessive sleepiness.  If mania then increase it back up 120 mg PM.  Is getting benefit.. Better tolerated than others so far but he does say it makes him hungry but he has chronic impulse control problems including overeating.  Disc Abilify depot.   Discussed potential metabolic side effects associated with atypical antipsychotics, as well as potential risk for movement side effects. Advised pt to contact office if movement side effects occur.   Reduce Hydroxyzine 10-20 mg QID prn to see if sleepiness is better than 25 mg.  This has been helpful. This  will help anxiety and itching and may have some mild antimanic effect. Consider modafinil if necessary for function.  He has a low stress tolerance.  He can be easily agitated in public.  He has continued abstinence from marijuana is encouraged.  He has a history of cannabinoid hyperemesis which finally convinced him to discontinue the marijuana.  His mood disorder and thought disorganization have improved since being off the marijuana.  He has a very long history of heavy marijuana dependence.   Sleep hygiene. Disc poss OSA and how it affects alertness.  Follow-up 8 weeks.   Meredith Staggers, MD, DFAPA  Please see After Visit Summary for patient specific instructions.  Future Appointments  Date Time Provider Department Center  09/16/2021  3:40 PM Carlus Pavlov, MD [facility] None  09/18/2021  2:15 PM Yates Decamp, MD [facility] None    No orders of the defined types were placed in this encounter.     -------------------------------

## 2021-08-01 LAB — LIPID PANEL WITH LDL/HDL RATIO
Cholesterol, Total: 232 mg/dL — ABNORMAL HIGH (ref 100–199)
HDL: 38 mg/dL — ABNORMAL LOW (ref >39)
LDL Chol Calc (NIH): 136 mg/dL — ABNORMAL HIGH (ref 0–99)
LDL/HDL Ratio: 3.6 ratio (ref 0.0–3.6)
Triglycerides: 319 mg/dL — ABNORMAL HIGH (ref 0–149)
VLDL Cholesterol Cal: 58 mg/dL — ABNORMAL HIGH (ref 5–40)

## 2021-08-12 ENCOUNTER — Other Ambulatory Visit: Payer: Self-pay | Admitting: Psychiatry

## 2021-08-12 DIAGNOSIS — F3110 Bipolar disorder, current episode manic without psychotic features, unspecified: Secondary | ICD-10-CM

## 2021-08-13 NOTE — Telephone Encounter (Signed)
Had visit 10/04 states reduce to 80 mg but phone message has he reduced to 60 mg. What dose should he be on?

## 2021-08-13 NOTE — Telephone Encounter (Signed)
Pt left a message that he wants the 60 mg sent in

## 2021-08-13 NOTE — Telephone Encounter (Signed)
Cody Short called and is requesting 60 mg instead of 80 mg?

## 2021-08-19 ENCOUNTER — Telehealth: Payer: Self-pay

## 2021-08-19 NOTE — Telephone Encounter (Signed)
Patient was denied for Repatha, I submitted his recent blood work

## 2021-08-21 ENCOUNTER — Other Ambulatory Visit: Payer: Self-pay | Admitting: Psychiatry

## 2021-08-21 DIAGNOSIS — L299 Pruritus, unspecified: Secondary | ICD-10-CM

## 2021-08-23 ENCOUNTER — Ambulatory Visit (HOSPITAL_COMMUNITY)
Admission: EM | Admit: 2021-08-23 | Discharge: 2021-08-23 | Disposition: A | Discharge status: Home / Self Care | Payer: 59 | Attending: Emergency Medicine | Admitting: Emergency Medicine

## 2021-08-23 ENCOUNTER — Other Ambulatory Visit: Payer: Self-pay

## 2021-08-23 ENCOUNTER — Encounter (HOSPITAL_COMMUNITY): Payer: Self-pay

## 2021-08-23 ENCOUNTER — Telehealth: Payer: Self-pay | Admitting: Psychiatry

## 2021-08-23 DIAGNOSIS — Z20822 Contact with and (suspected) exposure to covid-19: Secondary | ICD-10-CM | POA: Diagnosis not present

## 2021-08-23 DIAGNOSIS — R051 Acute cough: Secondary | ICD-10-CM | POA: Diagnosis not present

## 2021-08-23 LAB — SARS CORONAVIRUS 2 (TAT 6-24 HRS): SARS Coronavirus 2: NEGATIVE

## 2021-08-23 NOTE — ED Provider Notes (Signed)
[facility]    CSN: [id number] Arrival date & time: 08/23/21  0915      History   Chief Complaint No chief complaint on file.   HPI Cody Short is a 45 y.o. male.   Patient here for evaluation of cough.  Reports concern about COVID as multiple family members have recently tested positive.  In addition to cough reports having some congestion as well as feeling feverish.  Has not actually checked temperature at home.  Has not taken any OTC medications or treatments.  Patient does have history of diabetes type 2 as well as being a former smoker.  Denies any trauma, injury, or other precipitating event.  Denies any specific alleviating or aggravating factors.  Denies any chest pain, shortness of breath, N/V/D, numbness, tingling, weakness, abdominal pain, or headaches.    The history is provided by the patient.   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 Recurs if cannot walk for a week.      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 Authorizing Provider  Blood Glucose Monitoring Suppl (FREESTYLE LITE) DEVI Use as instructed to check sugar 4 times daily 01/17/21   Carlus Pavlov, MD  Evolocumab (REPATHA) 140 MG/ML SOSY Inject 1 pen into the skin every 14 (fourteen) days. Patient not taking: Reported on 07/30/2021 07/29/21   Yates Decamp, MD  glucose blood (FREESTYLE LITE) test strip Use as instructed to check sugar 4 times daily 01/18/21   Carlus Pavlov, MD  hydrOXYzine (ATARAX/VISTARIL) 10 MG tablet TAKE 1-2 TABLETS (10-20 MG TOTAL) BY MOUTH EVERY 6 (SIX) HOURS AS NEEDED. 08/22/21   Cottle, Steva Ready., MD  hydrOXYzine (VISTARIL) 25 MG capsule TAKE 1 CAPSULE (25 MG TOTAL) BY MOUTH EVERY 6 (SIX) HOURS AS NEEDED. 06/07/21   Cottle, Steva Ready., MD  insulin aspart (NOVOLOG FLEXPEN) 100 UNIT/ML FlexPen Inject 35 Units into the skin 3 (three) times daily with meals. 05/14/21   Carlus Pavlov, MD  Insulin Pen Needle (BD PEN NEEDLE NANO U/F) 32G X 4 MM MISC USE 4 TIMES DAILY WITH INSULIN PENS 04/17/20   Carlus Pavlov, MD  LANTUS SOLOSTAR 100 UNIT/ML Solostar Pen Inject 54 Units into the skin daily. 05/14/21   Carlus Pavlov, MD  LATUDA 60 MG TABS TAKE 1 TABLET BY MOUTH EVERY DAY 08/13/21   Cottle, Steva Ready., MD  metFORMIN (GLUCOPHAGE-XR) 500 MG 24 hr tablet TAKE 4 TABLETS (2,000 MG  TOTAL) BY MOUTH DAILY WITH SUPPER. 05/13/21   Carlus Pavlov, MD  OneTouch Delica Lancets 30G MISC 1 each by Does not apply route 4 (four) times daily. Use Onetouch Delica lancets to check blood sugar 4 times daily. DX:E11.65 11/29/19   Carlus Pavlov, MD  valsartan (DIOVAN) 160 MG tablet Take 160 mg by mouth daily.    [provider]    Family History Family History  Problem Relation Age of Onset   Diabetes Mother    Breast cancer Mother        was told not hereditary   Cirrhosis Mother        fatty liver   Diabetes Father    Melanoma Maternal Uncle     Colon cancer Neg Hx    Stomach cancer Neg Hx    Pancreatitis Neg Hx    Heart disease Neg Hx    Kidney disease Neg Hx    Liver disease Neg Hx     Social History Social History   Tobacco Use   Smoking status: Former    Packs/day: 2.50    Years: 10.00    Pack years: 25.00    Types: Cigarettes    Quit date: 10/27/2006    Years since quitting: 14.8   Smokeless tobacco: Never  Vaping Use   Vaping Use: Never used  Substance Use Topics   Alcohol use: No    Alcohol/week: 0.0 standard drinks    Comment: none at all   Drug use: No     Allergies   Tricor [fenofibrate], Divalproex sodium, Methylphenidate hcl, Oxycodone hcl, Ozempic (0.25 or 0.5 mg-dose) [semaglutide(0.25 or 0.5mg -dos)], Paroxetine, and Rosuvastatin   Review of Systems Review of Systems  Constitutional:  Positive for chills and diaphoresis.  HENT:  Positive for congestion.   Respiratory:  Positive for cough.   All other systems reviewed and are negative.   Physical Exam Triage Vital Signs ED Triage Vitals [08/23/21 1031]  Enc Vitals Group     BP (!) 144/89     Pulse Rate 96     Resp 18     Temp 98.8 F (37.1 C)     Temp Source Oral     SpO2 98 %     Weight      Height      Head Circumference      Peak Flow      Pain Score      Pain Loc      Pain Edu?      Excl. in GC?    No data found.  Updated Vital Signs BP (!) 144/89 (BP Location: Left Arm)   Pulse 96   Temp 98.8 F (37.1 C) (Oral)   Resp 18   SpO2 98%   Visual Acuity Right Eye Distance:   Left Eye Distance:   Bilateral Distance:    Right Eye Near:   Left Eye Near:    Bilateral Near:     Physical Exam Vitals and nursing note reviewed.  Constitutional:      General: He is not in acute distress.    Appearance: Normal appearance. He is not ill-appearing, toxic-appearing or diaphoretic.  HENT:     Head: Normocephalic and atraumatic.     Nose: No congestion.  Eyes:     Conjunctiva/sclera: Conjunctivae normal.  Cardiovascular:      Rate and Rhythm: Normal rate and regular rhythm.     Pulses: Normal pulses.     Heart sounds: Normal heart sounds.  Pulmonary:  Effort: Pulmonary effort is normal.     Breath sounds: Normal breath sounds.  Abdominal:     General: Abdomen is flat.  Musculoskeletal:        General: Normal range of motion.     Cervical back: Normal range of motion.  Skin:    General: Skin is warm and dry.  Neurological:     General: No focal deficit present.     Mental Status: He is alert and oriented to person, place, and time.  Psychiatric:        Mood and Affect: Mood normal.     UC Treatments / Results  Labs (all labs ordered are listed, but only abnormal results are displayed) Labs Reviewed  SARS CORONAVIRUS 2 (TAT 6-24 HRS)    EKG   Radiology No results found.  Procedures Procedures (including critical care time)  Medications Ordered in UC Medications - No data to display  Initial Impression / Assessment and Plan / UC Course  I have reviewed the triage vital signs and the nursing notes.  Pertinent labs & imaging results that were available during my care of the patient were reviewed by me and considered in my medical decision making (see chart for details).    Assessment negative for red flags or concerns.  COVID test pending.  Discussed current CDC guidelines for quarantine.  As symptoms started within the last 2 days and patient does have history of asthma, diabetes type 2, as well as being a former smoker he would qualify for antiviral treatment.  Last GFR in July was greater than 60 so he would be a candidate for Paxlovid if his test is positive.  Tylenol and or ibuprofen as needed.  Encourage fluids and rest.  Discussed conservative symptom management as described in discharge instructions.  Follow-up as needed. Final Clinical Impressions(s) / UC Diagnoses   Final diagnoses:  Acute cough     Discharge Instructions      Your COVID test is pending.  The results  will show up in your MyChart  You can take Tylenol and/or Ibuprofen as needed for fever reduction and pain relief.    For cough: honey 1/2 to 1 teaspoon (you can dilute the honey in water or another fluid).  You can also use guaifenesin and dextromethorphan for cough. You can use a humidifier for chest congestion and cough.  If you don't have a humidifier, you can sit in the bathroom with the hot shower running.     For sore throat: try warm salt water gargles, cepacol lozenges, throat spray, warm tea or water with lemon/honey, popsicles or ice, or OTC cold relief medicine for throat discomfort.    For congestion: take a daily anti-histamine like Zyrtec, Claritin, and a oral decongestant, such as pseudoephedrine.  You can also use Flonase 1-2 sprays in each nostril daily.    It is important to stay hydrated: drink plenty of fluids (water, gatorade/powerade/pedialyte, juices, or teas) to keep your throat moisturized and help further relieve irritation/discomfort.   Return or go to the Emergency Department if symptoms worsen or do not improve in the next few days.      ED Prescriptions   None    PDMP not reviewed this encounter.   Ivette Loyal, NP 08/23/21 1049

## 2021-08-23 NOTE — Telephone Encounter (Signed)
Pt's Dad called.  He says [name] has being seeing Dr. Jennelle Human for years.  He recently reduced his meds and [name] has "gone off the rails".  He spent $1000 last night online gambling.  He isn't suggesting that the medicine is causing this, but he wants some help/advice from Dr Jennelle Human.    Next appt 12/14

## 2021-08-23 NOTE — Discharge Instructions (Signed)
Your COVID test is pending.  The results will show up in your MyChart  You can take Tylenol and/or Ibuprofen as needed for fever reduction and pain relief.    For cough: honey 1/2 to 1 teaspoon (you can dilute the honey in water or another fluid).  You can also use guaifenesin and dextromethorphan for cough. You can use a humidifier for chest congestion and cough.  If you don't have a humidifier, you can sit in the bathroom with the hot shower running.     For sore throat: try warm salt water gargles, cepacol lozenges, throat spray, warm tea or water with lemon/honey, popsicles or ice, or OTC cold relief medicine for throat discomfort.    For congestion: take a daily anti-histamine like Zyrtec, Claritin, and a oral decongestant, such as pseudoephedrine.  You can also use Flonase 1-2 sprays in each nostril daily.    It is important to stay hydrated: drink plenty of fluids (water, gatorade/powerade/pedialyte, juices, or teas) to keep your throat moisturized and help further relieve irritation/discomfort.   Return or go to the Emergency Department if symptoms worsen or do not improve in the next few days.

## 2021-08-23 NOTE — Telephone Encounter (Signed)
I called father to try to gather more information. He states that he is back into staying up all night and sleeping during the day, which is not new. He states that patient has been more verbally abusive recently. The last 2 nights patient has spent over $1000 with online gambling. He said he found out because he has patient's account linked to his. Dad says that he has done this before but with amounts around $20. When dad confronted patient he said he "couldn't help himself." Dad said that he confronted patient in an irate manner. Dad, dad's mother, and wife all have COVID currently. He did not say that patient was positive.

## 2021-08-23 NOTE — Telephone Encounter (Signed)
Information given to the dad. He stated patient was sleeping and did not know how many tablets he had left. He said he felt like he had enough to get him thru the weekend. I told him we would send Rx for 80 mg when needed. He expressed understanding and was appreciative.

## 2021-08-23 NOTE — Telephone Encounter (Signed)
Have patient increase the Latuda.  If they have a lot of 60 mg tablets left they can go up to 1-1/2 of the 60s until they are gone.  Otherwise if needed we can send in a prescription for Latuda 80 mg 1 daily.  It sounds like he has become more manic as he is reduced the dosage

## 2021-08-26 ENCOUNTER — Telehealth: Payer: Self-pay

## 2021-08-26 ENCOUNTER — Other Ambulatory Visit: Payer: Self-pay | Admitting: Psychiatry

## 2021-08-26 DIAGNOSIS — F3110 Bipolar disorder, current episode manic without psychotic features, unspecified: Secondary | ICD-10-CM

## 2021-08-26 MED ORDER — LURASIDONE HCL 80 MG PO TABS: 80.0000 mg | ORAL_TABLET | Freq: Every day | ORAL | 0 refills | Status: DC

## 2021-08-26 NOTE — Telephone Encounter (Signed)
RX sent for Latuda 80 mg daily.  No further action needed.

## 2021-08-30 ENCOUNTER — Encounter: Payer: Self-pay | Admitting: Internal Medicine

## 2021-09-04 ENCOUNTER — Other Ambulatory Visit: Payer: Self-pay | Admitting: Psychiatry

## 2021-09-04 DIAGNOSIS — F411 Generalized anxiety disorder: Secondary | ICD-10-CM

## 2021-09-04 NOTE — Telephone Encounter (Signed)
90 day ok?

## 2021-09-15 ENCOUNTER — Encounter: Payer: Self-pay | Admitting: Cardiology

## 2021-09-16 ENCOUNTER — Ambulatory Visit: Payer: 59 | Admitting: Internal Medicine

## 2021-09-16 NOTE — Telephone Encounter (Signed)
From patient.

## 2021-09-18 ENCOUNTER — Ambulatory Visit: Payer: 59 | Admitting: Cardiology

## 2021-09-27 ENCOUNTER — Encounter: Payer: Self-pay | Admitting: Internal Medicine

## 2021-09-27 DIAGNOSIS — E1165 Type 2 diabetes mellitus with hyperglycemia: Secondary | ICD-10-CM

## 2021-09-27 MED ORDER — BD PEN NEEDLE NANO U/F 32G X 4 MM MISC: 0 refills | Status: DC

## 2021-09-29 ENCOUNTER — Other Ambulatory Visit: Payer: Self-pay | Admitting: Psychiatry

## 2021-09-29 DIAGNOSIS — F411 Generalized anxiety disorder: Secondary | ICD-10-CM

## 2021-10-01 ENCOUNTER — Encounter: Payer: Self-pay | Admitting: Internal Medicine

## 2021-10-02 ENCOUNTER — Other Ambulatory Visit: Payer: Self-pay | Admitting: Internal Medicine

## 2021-10-02 DIAGNOSIS — E1165 Type 2 diabetes mellitus with hyperglycemia: Secondary | ICD-10-CM

## 2021-10-02 MED ORDER — NOVOLOG FLEXPEN 100 UNIT/ML ~~LOC~~ SOPN: 35.0000 [IU] | PEN_INJECTOR | Freq: Four times a day (QID) | SUBCUTANEOUS | 11 refills | Status: DC

## 2021-10-03 ENCOUNTER — Ambulatory Visit: Payer: 59 | Admitting: Cardiology

## 2021-10-09 ENCOUNTER — Encounter: Payer: Self-pay | Admitting: Psychiatry

## 2021-10-09 ENCOUNTER — Telehealth (INDEPENDENT_AMBULATORY_CARE_PROVIDER_SITE_OTHER): Payer: 59 | Admitting: Psychiatry

## 2021-10-09 ENCOUNTER — Other Ambulatory Visit: Payer: Self-pay | Admitting: Psychiatry

## 2021-10-09 DIAGNOSIS — F630 Pathological gambling: Secondary | ICD-10-CM

## 2021-10-09 DIAGNOSIS — F3110 Bipolar disorder, current episode manic without psychotic features, unspecified: Secondary | ICD-10-CM

## 2021-10-09 DIAGNOSIS — F4001 Agoraphobia with panic disorder: Secondary | ICD-10-CM

## 2021-10-09 DIAGNOSIS — L299 Pruritus, unspecified: Secondary | ICD-10-CM

## 2021-10-09 DIAGNOSIS — F411 Generalized anxiety disorder: Secondary | ICD-10-CM | POA: Diagnosis not present

## 2021-10-09 DIAGNOSIS — F1211 Cannabis abuse, in remission: Secondary | ICD-10-CM

## 2021-10-09 DIAGNOSIS — F5113 Hypersomnia due to other mental disorder: Secondary | ICD-10-CM | POA: Diagnosis not present

## 2021-10-09 DIAGNOSIS — F5105 Insomnia due to other mental disorder: Secondary | ICD-10-CM

## 2021-10-09 DIAGNOSIS — G251 Drug-induced tremor: Secondary | ICD-10-CM

## 2021-10-09 MED ORDER — HYDROXYZINE HCL 10 MG PO TABS: 10.0000 mg | ORAL_TABLET | Freq: Three times a day (TID) | ORAL | 0 refills | Status: DC | PRN

## 2021-10-09 MED ORDER — LURASIDONE HCL 80 MG PO TABS: 80.0000 mg | ORAL_TABLET | Freq: Every day | ORAL | 0 refills | Status: DC

## 2021-10-09 NOTE — Patient Instructions (Signed)
Constipation management 1.  Lots of water 2.  Powdered fiber supplement such as MiraLAX, Citrucel, etc. preferably with a meal 3.  2 stool softeners a day 4.  Milk of magnesia or magnesium tablets if needed  

## 2021-10-09 NOTE — Progress Notes (Signed)
Davis Ambrosini Farone [id number] 02-04-1976 45 y.o.   Virtual Visit via Telephone Note  I connected with pt by telephone and verified that I am speaking with the correct person using two identifiers.   I discussed the limitations, risks, security and privacy concerns of performing an evaluation and management service by telephone and the availability of in person appointments. I also discussed with the patient that there may be a patient responsible charge related to this service. The patient expressed understanding and agreed to proceed.  I discussed the assessment and treatment plan with the patient. The patient was provided an opportunity to ask questions and all were answered. The patient agreed with the plan and demonstrated an understanding of the instructions.   The patient was advised to call back or seek an in-person evaluation if the symptoms worsen or if the condition fails to improve as anticipated.  I provided 30 minutes of non-face-to-face time during this encounter. The patient was located at home and the provider was located office.  Session from 3:00 until 330.   Subjective:   Patient ID:  Cody Short is a 45 y.o. (DOB [birth year]) male.  Chief Complaint:  Chief Complaint  Patient presents with   Follow-up    Bipolar I disorder, most recent episode (or current) manic (HCC) Schizoaffective disorder, bipolar type (HCC)   Fatigue     Cody Short presents to the office today for follow-up of several psychiatric diagnoses ...    seen August 2020.  For obsessive anxiety  Switched Lexapro 5 mg daily on May 18.  But he stated it made him hungry and so it was stopped in August.  He continued on Saphris 10 mg nightly, lithium 1200 mg daily, propranolol as needed tremor.  Lithium level was ordered.  He called Sept saying he was manic.  Had a lithium level 0.7 and so dose was increased to 1500 mg daily.    Last seen December 21, 2019.  He had some  ongoing manic symptoms and Saphris was increased to 15 mg nightly.  02/07/2020 appointment with the following noted: He has made multiple phone calls to the office since that date.  He decided he wanted to stop lithium because of urinary problems.  He was warned this could lead to mania but he wanted to pursue it anyway.  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  Cody Short had been having bedwetting episodes which she attributed to the lithium.  He has not consistently taken Saphris 15 mg at bedtime since the office visit in February. He completely stopped lithium January 30, 2020 U frequency is much better and less thirsty.  Bedwetting resolved.  Reduced BM to 1-2 times daily.  Gassy.  Glucose is high.  B is hospitalized with unknown disease.  Insulin adjusted. Mental status is best it's ever been.  Less mania.  Less obsessing.  No worsening mania. Is taking Saphris 15 mg HS> Plan no med changes.  04/10/2020 appointment with the following noted: No longer on lithium.  Still frequent urination and thirst DT DM poorly controlled despite meds.  Endo says little else to take bc history of pancreatitis.  Says he goes to urinate q 15 mins. Plan: Wean Saphris in crossover to loxapine 150 mg HS over 2 weeks bc he and endocrinologist believe it's likely Saphris is cause of the inability to get his glucose to acceptable levels.   06/07/20 appt with the following noted: He's called  a couple of times CO sleepiness and reduced loxapine to 50 mg HS. Started Vegan diet and BS is better 4 weeks ago.  Thinks glucose is better off the Saphris. Lost 6 #.   CO sleepiness and sleep 16 hours   He thinks it also drove up pulse and blood pressure. Asks about taking Saphris 10 and a second medication. Mood has been fine.   Plan: He wants to restart Saphris 10 mg HS and use a 2nd mood stabilizer. Start Saphris and reduce Loxapine  To 10 mg at night DT prior failure of 10 Mg  Saphris with lithium.  06/27/2020 phone call from patient: Pt called to report Loxapine & Saphris is causing his BP to be very high and Saphris is causing blood sugar to be high. Apt 10/18. Need changes before then. Call back @ [phone]  MD response: He had wanted to go back on Saphris and reduce loxapine which we did.  Tell him to stop the Saphris and increase loxapine to 20 mg nightly.  He did not tolerate 50 mg of loxapine very well. Nurse TC withpt: Spoke with Cody Short and he will stop the Saphris, but feels the increase in the Loxapine to 20 mg will increase his blood sugars more. Explained that with coming off the Saphris he needs to increase it a bit or he will not be stable with his moods. Advised him to stop the Saphris and would check with Dr. Jennelle Human about any other recommendations.   07/17/20 appt with the following noted: Doing terrible.  Needs melatonin to sleep.  Claims loxapine is elevating BP, pulse, TG, cholesterol, glucose.  Claimed Saphris did it too.   Sees PCP at end of October.   Started Vegan diet about 6 weeks ago and exercising.   A/P: There are no available mood stabilizers that do not have a warning of elevating blood sugar and cholesterol.  He has tried all the mood stabilizers that do not have this warning.  All of the available mood stabilizers left that have any effectiveness are antipsychotics and the FDA has required a class warning on all antipsychotics about blood sugar and cholesterol effects.  This is true despite the fact that not all antipsychotics are equal at increasing these risks.  The patient's reading of these potential side effects is complicating finding an effective treatment.  The only way to prove whether or not these meds are elevating his blood sugar and cholesterol is to stop the medications for a period of time.  This exposes him to manic risk but there is no chance of finding an adequate mood stabilizer that will satisfy him until we can prove whether or  not these meds are in fact affecting his blood sugar, cholesterol, blood pressure, and pulse. Therefore he will stop the loxapine which is at a low dose. We will check labs in 3 weeks.  We will attempt follow-up in 4 weeks.  If he gets manic he will let us know.   08/13/2020 appointment with the following noted: Blood sugar is pretty much the same off the loxapine but BP and pulse improved from 110 to 70 range.  But had started BP med about mid September. Started counselor Illene Labrador at [facility] and started nutritionsist.  Has changed and restricted diet. Sees PCP 07/24/20.  Endo is managing DM.   Mood has been really good.  Counseling helped the anxiety.  Getting 10 hours of sleep.  PCP suggested changing time going to bed earlier  and it's helping.   Sometimes has racing thoughts and gets comments from others while playing video games that he needs to take a break. Patient reports stable mood and deniesr irritable moods.  Denies appetite disturbance.  Patient reports that energy and motivation have been good.  Patient denies any difficulty with concentration.  Patient denies any suicidal ideation.  Admits he's easily stressed.   10/09/20 appt with following noted: Getting manic as hell and depressed as hell. Talking too much and obsessing over things.  Talking too loud and doing things extremely fast.  Only depressed a couple of days ago.  Manic sx are brief and not all the time. Sleep 7-8 hours and sleep schedule is more normal. Stayed on Caplyta and pleased overall with SE. BS is better but hypertension is ongoing. Low TSH. Still counseling. No delusions hallucinations since off drugs. Give samples of Caplyta 42 mg once daily. Prefer no change befor ethe holidays bc he is not continuously manic or depressed. Likely switch to Jordan after the holidays.  11/19/2020 appointment with the following noted: Best I've ever been except some controllable manic.  Able to do  things fast and easily.  Anxiety medicine and counseling has helped.  Better function.  No SE. Not depressed in a while.  But not sure the Capylta helps mania.   Lost from 230# to 189# with diet and exercise over a long period.   Sleep 4-8 hours, but not that I can't sleep but don't feel like sleeping.  Not itching any more.  I'm a a whole new person.   I can't keep up the mania.  Driving my parents and friends crazy. Blood sugar better and off insulin.  Only taking metformin. Can't afford Caplyta. So feels he must change. Hydroxyzine really helped anxiety. Plan: DC Caplyta DT mania.  switch to Latuda 40 then 80 mg with food.  Pick up samples.  12/03/2020 phone call from parents:Parents called and want dr. Jennelle Human to know that Cody Short is maniac and he is having physical aggression and all he does is play video games 24 hours a day and is not sleeping. Parents feel like Cody Short is not giving you the whole picture and needs an adjustment to his medicine. MD response:atient acknowledged that his appointment on January 24 that he was manic while taking Caplyta.  He also acknowledged that his family saw him as being manic.  We discontinued Caplyta and switched him to Jordan.  If he made the switch and increase the dose in a timely manner he should have been on 80 mg daily for a week now.  His parents are complaining that he is still manic.  Provided he is tolerating the Latuda reasonably well have him increase to 120 mg daily.  He can take 1-1/2 of the 80 mg tablets until he runs out and then we can send in a refill for 120 mg daily.  Remind him he needs to take that with at least 350 cal. Phone call with patient from nurse: Patient rtc and he did report he's doing great on the medication but is still having mania. Reports staying up all night playing video games then asleep about 5 am and back up at noon. He is taking medication with 350 calories also. Advised him to increase to 120 mg Latuda daily. He reports  having only 40 mg tablets at home so instructed him to take 3 tablets and I would get more samples out for him to pick up tomorrow. He  agreed and verbalized understanding.  12/07/2020 phone call: Patient called again about his Latuda. He states that it makes him really hungry after he takes it, he takes a nap and wakes up hungry again. He would like to know would it be okay to take before bed instead. MD response: He is unlikely to comply with recommendations about routine sleep and wake times.  He is in a habit of playing video games late into the night.  If he prefers he can split the dosage of the Latuda between breakfast and another meal.  But it is very important that he get the full dose in every day and take it with 350 cal. Nursing phone call with patient:Rtc to patient and he reports he has to take it all at hs because it makes him sleepy, I advised him to continue that regimen. He also reports not sleeping well but he's been drinking caffeine later in the day. Advised him to not drink any caffeine past 2-3 pm. He agreed. He reports his blood sugars have been elevated some, not as bad but it is up some.   12/20/2020 appointment with the following noted: Mania tremendously better but occ anger outbursts anger.  6 hours of sleep with melatonin, hydroxyzine and Latuda 120 mg for 2 weeks.  Staying up all night playing video games. Kasandra Knudsen does make him hungry right after he takes it so takes it before bedtime.  Taking with food. Hyperactivity and loud talking is better.  I can function fast.  Parents see benefit with mania but are concerned about his anger outbursts but he things she takes stress out on him.  Blood sugar is generally good except when eats too much.  Taking it with dinner and HS.   Able to drive around town.  Can cook and clean.  Less ruminating on his past.  Less need for direction.   Counseling is helping at [facility] counseling. Hydroxyzine helped itching and anxiety.  Plan:  Continue Latuda 120 mg PM.  Is getting benefit but only been on it for a couple of weeks.   01/17/2021 appointment with following noted: So so.  Some anger outburts a couple of times.  Eats too much.  Blood sugar is pretty bad.  Up at night playing video games and then sleeps too late in the day.  Takes Latuda at evening meal.  Wants to blame meds for these behaviors. Otherwise mood is "good I guess".  No serious highs or lows.  Can get in arguments with mother when she tries to correct him and may feel guilty afterwards. Takes hydroxyzine q 6 hours because of anxiety and bc dx thyroid inflamed, thyroiditis.  Asks if there's anything to help with overeating. Lost weight from thyroid.   Plan: Continue Latuda 120 mg as he is tolerating it and it appears to be controlling mania better than did the Caplyta and he is tolerating it better.  02/12/2021 appointment with the following noted: Perfectly good with mood and depression.  Needs hydroxyzine for itching and anxiety.  Energy poor with thyroiditis. Will sleep too much too bc gets bored and no physical activity. Varies 9-16 hours daily.  Can nap easily.  Not spacy.   No anger lately. Tolerating Latuda well.   Still eats too many sweets late at night and gets high blood sugars.   Plan: Increase  topiramate 50 mg BID off label for weight loss and disc SE.  He wants to try something.  03/20/2021 appointment with the following  noted: Taking meds.  A little benefit with increase topiramate. Mood is perfectly fine without mania nor depression. Blood sugar is a bit high. Claims doctor thinks rash could be related to [facility] but he sleeps all the time bc hypothyroidism. Has folliculitis Started treatment for it and it is helping.   Thyroid problem is a wait and see.  Could take a year to get better. Doesn't think topiramate added tiredness. Not taking hydroxyzine much about once daily. Dealing with difficult GM threatening sui if left alone.  Plan:  Continue Latuda 120 mg PM.  Is getting benefit.. Better tolerated than others so far but he does say it makes him hungry but he has chronic impulse control problems including overeating.  05/06/2021 appointment with the following noted: Quit topiramate DT nausea and not helping at all. Rash resolved. Lipids not terrible but a little up.  TSH normalized. HGBA1C 9.3 Not exercising. Not doing much re: work etc other than video games. Not manic lately. Patient reports stable mood and denies depressed or irritable moods.  Patient denies any recent difficulty with anxiety.   Denies appetite disturbance.  Patient reports that energy and motivation have been good.  Patient denies any difficulty with concentration.  Patient denies any suicidal ideation. Energy is better than it was but still sleeping 12 hours daily and up at night playing videos with friends. Plan: Reduce Latuda to 80 mg to reduce excessive sleepiness.  If mania then increase it back up 120 mg PM.  Is getting benefit..  06/20/21 TC:  Pt stated the cramps have spread to his arms and legs.He has been on latuda 80 mg qd for awhile now.He stated he is also going to see a cardiologist due to heart spasms. MD response:  The cramps are not likely related to Jordan.  Usually it is related to dehydration although I see from chart review that he had elevated creatinine kinase from a statin and that was probably causing some of his problem.  He needs to make sure he drinks lots of water.  Especially because he is diabetic and that will make him prone to dehydration if he is not controlling his blood sugar. However the sleepiness might be related to Jordan.  He had previously been on 120 mg daily and we reduced it to 80 mg daily and if he is done okay with his mood we can try reducing it 1 more time to 60 mg.  I will send in a prescription for the 60 mg tablet and he can reduce it if he would like to see if the sleepiness is related to Jordan.  If Kasandra Knudsen  is contributing to sleepiness it will get better if he reduces the dose.  If the sleepiness does not get better with the dosage reduction then Latuda is not the cause Reduce Latuda to 60 mg daily  07/30/2021 appt noted: Not much difference in alertness Mania since reducing Latuda. Hypoactive bc thryroid problems and may be the cause of the cramps per the other doctors. Itches badly without hydroxyzine. Spent $500 on gambling game and adeleted.  Past history of drug induced gambling but no major drugs in 15 years. Still exhausted and sleep all day.  But is taying up at night.  Sleeping 16 hours daily.  Wonders if hydroxyzine 25 is making him tired. Itching worse with stress and stress of GM and B crazy. Asks about bipolar shot. Side sleeper and as far as he knows no excessive snoring.  08/23/2021 phone call  from patient's father stating that Cody Short had "gone off the rails" and it spent $1000 on the night prior doing online gambling and had gone back to staying up all night and sleeping during the day.  He had also been more verbally abusive.  It was suggested that the Latuda dose be increased from 60 to 80 mg in the evening.  10/09/2021 appointment with the following noted: Sleeping a lot and easily exhausted even after med were changed.   SE a little constipation. History of problems with gambling in the past but not current.  Not thinking of it now. No other manic sx currently.   On Latuda 80 daily now.  Reduced hydroxyzine to 10 and still sleepy.  No other psych meds  Works with [employer].  Prior psychiatric medications: risperidone with cognitive side effects, Abilify NR,  Zyprexa SE glucose, Seroquel 600 mg SE wt, perphenazineSE, Saphris 20 partial response but SE increase glucose , haloperidol EPS, Geodon EPS,  Vraylar, loxapine SE, Caplyta NR, Latuda 120 He's prone to EPS. Depakote (Note patient had severe pancreatitis from Depakote.) , Equetro, topiramate nausea Lithium was  stopped early April 2021 bc of diabetes insipidus.  N-acetylcysteine with minimal benefit, , Amantadine with vomiting.  Sertraline and lexapro sexual SE,   Propranolol for tremor, lithium 1500,     Review of Systems:  Review of Systems  Constitutional:  Positive for fatigue.  Endocrine: Positive for polydipsia and polyuria.  Genitourinary:  Negative for decreased urine volume and frequency.  Musculoskeletal:  Positive for myalgias.  Skin:  Positive for rash.       Itching resolved with hydroxyzine  Neurological:  Negative for tremors and weakness.  Psychiatric/Behavioral:  Positive for decreased concentration. Negative for agitation, behavioral problems, confusion, dysphoric mood, hallucinations, self-injury, sleep disturbance and suicidal ideas. The patient is hyperactive.    Medications: I have reviewed the patient's current medications.  Current Outpatient Medications  Medication Sig Dispense Refill   Blood Glucose Monitoring Suppl (FREESTYLE LITE) DEVI Use as instructed to check sugar 4 times daily 1 each 0   glucose blood (FREESTYLE LITE) test strip Use as instructed to check sugar 4 times daily 400 each 3   insulin aspart (NOVOLOG FLEXPEN) 100 UNIT/ML FlexPen Inject 35-45 Units into the skin in the morning, at noon, in the evening, and at bedtime. 45 mL 11   Insulin Pen Needle (BD PEN NEEDLE NANO U/F) 32G X 4 MM MISC USE 4 TIMES DAILY WITH INSULIN PENS 400 each 0   LANTUS SOLOSTAR 100 UNIT/ML Solostar Pen Inject 54 Units into the skin daily. 60 mL 3   metFORMIN (GLUCOPHAGE-XR) 500 MG 24 hr tablet TAKE 4 TABLETS (2,000 MG TOTAL) BY MOUTH DAILY WITH SUPPER. 360 tablet 2   OneTouch Delica Lancets 30G MISC 1 each by Does not apply route 4 (four) times daily. Use Onetouch Delica lancets to check blood sugar 4 times daily. DX:E11.65 400 each 11   valsartan (DIOVAN) 160 MG tablet Take 160 mg by mouth daily.     Evolocumab (REPATHA) 140 MG/ML SOSY Inject 1 pen into the skin every 14  (fourteen) days. (Patient not taking: Reported on 07/30/2021) 6 mL 3   hydrOXYzine (ATARAX) 10 MG tablet Take 1 tablet (10 mg total) by mouth 3 (three) times daily as needed. 270 tablet 0   lurasidone (LATUDA) 80 MG TABS tablet Take 1 tablet (80 mg total) by mouth daily with breakfast. 90 tablet 0   Current Facility-Administered Medications  Medication Dose Route  Frequency Provider Last Rate Last Admin   Evolocumab SOAJ 140 mg  140 mg Subcutaneous Once Yates Decamp, MD        Medication Side Effects: Other: less tremor  Allergies:  Allergies  Allergen Reactions   Tricor [Fenofibrate] Other (See Comments)    Myopathy   Divalproex Sodium Other (See Comments)    unknown   Methylphenidate Hcl     Aggravate bipolar   Oxycodone Hcl     REACTION: hallucinations   Ozempic (0.25 Or 0.5 Mg-Dose) [Semaglutide(0.25 Or 0.5mg -Dos)] Nausea Only   Paroxetine     Aggravate bipolar disorder   Rosuvastatin Other (See Comments)    Myopathy -muscle cramps, elevated CK    Past Medical History:  Diagnosis Date   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 Recurs if cannot walk for a week.      TOBACCO ABUSE, HX OF 07/31/2009    Family History  Problem Relation Age of Onset   Diabetes Mother    Breast cancer Mother        was told not hereditary   Cirrhosis Mother        fatty liver   Diabetes Father    Melanoma Maternal Uncle    Colon cancer Neg Hx    Stomach cancer Neg Hx    Pancreatitis Neg Hx    Heart disease Neg Hx    Kidney disease Neg Hx    Liver disease Neg Hx     Social History   Socioeconomic History   Marital status: Single    Spouse name: Not on file   Number of children: 0   Years of education: Not on file   Highest education level: Not on file  Occupational History   Not  on file  Tobacco Use   Smoking status: Former    Packs/day: 2.50    Years: 10.00    Pack years: 25.00    Types: Cigarettes    Quit date: 10/27/2006    Years since quitting: 14.9   Smokeless tobacco: Never  Vaping Use   Vaping Use: Never used  Substance and Sexual Activity   Alcohol use: No    Alcohol/week: 0.0 standard drinks    Comment: none at all   Drug use: No   Sexual activity: Not on file  Other Topics Concern   Not on file  Social History Narrative   Single. Not dating currently. No kids.    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   Please see review of systems for further details on the patient's review from today.   Objective:   Physical Exam:  There were no vitals taken for this visit.  Physical Exam Neurological:     Mental Status: He is  alert and oriented to person, place, and time.     Cranial Nerves: No dysarthria.  Psychiatric:        Attention and Perception: Attention normal.        Mood and Affect: Mood is anxious. Mood is not depressed or elated.        Speech: Speech is not rapid and pressured or slurred.        Behavior: Behavior is not agitated, aggressive or hyperactive. Behavior is cooperative.        Thought Content: Thought content is not paranoid or delusional. Thought content does not include homicidal or suicidal ideation. Thought content does not include homicidal or suicidal plan.        Cognition and Memory: Cognition and memory normal.     Comments: No  paranoia.   Insight and judgment fair to poor. Chronically self-preoccupied and hyperthymic style a little better.  Less pressured and less manic presently.     Lab Review:     Component Value Date/Time   NA 139 05/17/2021 1952   K 3.8 05/17/2021 1952   CL 105 05/17/2021 1952   CO2 23 05/17/2021 1952   GLUCOSE 88 05/17/2021 1952   BUN 12 05/17/2021 1952   CREATININE 1.10 05/17/2021 1952   CREATININE 1.24 06/18/2018 1502   CALCIUM 9.2 05/17/2021 1952   PROT 7.5 05/17/2021 1952   ALBUMIN 4.7 05/17/2021 1952   AST 54 (H) 05/17/2021 1952   ALT 45 (H) 05/17/2021 1952   ALKPHOS 65 05/17/2021 1952   BILITOT 0.3 05/17/2021 1952   GFRNONAA >60 05/17/2021 1952   GFRNONAA 84 04/02/2018 1551   GFRAA 97 04/02/2018 1551       Component Value Date/Time   WBC 7.9 05/17/2021 1952   RBC 4.60 05/17/2021 1952   HGB 13.6 05/17/2021 1952   HCT 40.8 05/17/2021 1952   PLT 243 05/17/2021 1952   MCV 88.7 05/17/2021 1952   MCH 29.6 05/17/2021 1952   MCHC 33.3 05/17/2021 1952   RDW 13.2 05/17/2021 1952   LYMPHSABS 1.9 06/27/2016 1441   MONOABS 0.8 06/27/2016 1441   EOSABS 0.2 06/27/2016 1441   BASOSABS 0.1 06/27/2016 1441    Lithium Lvl  Date Value Ref Range Status  09/09/2019 1.1 0.6 - 1.2 mmol/L Final    12/14/19 lithium level 0.6 (trough 13 hours) , normal BMP and calcium   No results found for: PHENYTOIN, PHENOBARB, VALPROATE, CBMZ   .res Assessment: Plan:    Cody Short was seen today for follow-up and fatigue.  Diagnoses and all orders for this visit:  Bipolar I disorder, most recent episode (or current) manic (HCC) -     lurasidone (LATUDA) 80 MG TABS tablet; Take 1 tablet (80 mg total) by mouth daily with breakfast.  Generalized anxiety disorder -     lurasidone (LATUDA) 80 MG TABS tablet; Take 1 tablet (80 mg total) by mouth daily with breakfast.  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  Insomnia due to mental condition  Marijuana abuse in remission  Tremor due to multiple drugs  Addictive gambling  Itching -     hydrOXYzine (ATARAX) 10 MG tablet; Take 1 tablet (10 mg total) by mouth 3 (three) times daily as needed.  Cody Short has had significant disruptive mood and psychotic symptoms and substance abuse over the course of his treatment here.  It has been evident in chronic occupational dysfunction and some relational problems.  He is also been very prone to EPS and elevated blood sugars from atypicals making it difficult to get adequate mood stabilization.   Has a history of severe pancreatitis from Depakote.  He did not have an adequate response to carbamazepine.   lithium was insufficient to control his symptoms in monotherapy and he developed diabetes insipidus.  There are no available mood stabilizers that do not have a warning of elevating blood sugar and cholesterol.  He has tried all the mood stabilizers that do not have this warning.  All of the available mood stabilizers left that have any effectiveness are antipsychotics and the FDA has required a class warning on all antipsychotics about blood sugar and cholesterol effects.  This is true despite the fact that not all antipsychotics are equal and increasing these risks.  The patient's reading of these potential side effects is complicating finding an effective treatment.  The only way to prove whether or not these meds are elevating his blood sugar and cholesterol is to stop the medications for a period of time.  This exposes him to manic risk but there is no chance of finding an adequate mood stabilizer that will satisfy him until we can prove whether or not these meds are in fact affecting his blood sugar, cholesterol, blood pressure, and pulse.  In general he has not been able to afford branded medications which further limits options such as the use of Latuda which has  a lower metabolic risk than other antipsychotics.  He is also EPS prone which further limits options.  Latuda  Been the best med so far for him.     Sleepiness was not better with reduction in Latuda from 120 to 60 mg daily.  Continue Latuda 80 mg bc manic with less .  If recurrence of mania then increase it back up 120 mg PM.  Is getting benefit.. Better tolerated than others so far but he does say it makes him hungry but he has chronic impulse control problems including overeating.  Disc Abilify depot or risperidone or another depot .   Discussed potential metabolic side effects associated with atypical antipsychotics, as well as potential risk for movement side effects. Advised pt to contact office if movement side effects occur.   continue Hydroxyzine 10-20 mg QID prn to see if sleepiness is better than 25 mg.  This has been helpful for anxiety and itching and may have some mild antimanic effect.  Consider modafinil if necessary for function.  Disc risk of mania.  Disc SE.    He has a low stress tolerance.  He can be easily agitated in public. OK to schedule with Elio Forget for therapy  He has continued abstinence from marijuana is encouraged.  He has a history of cannabinoid hyperemesis which finally convinced him to discontinue the marijuana.  His mood disorder and thought disorganization have improved since being off the marijuana.  He has a very long history of heavy marijuana dependence.   Constipation management 1.  Lots of water 2.  Powdered fiber supplement such as MiraLAX, Citrucel, etc. preferably with a meal 3.  2 stool softeners a day 4.  Milk of magnesia or magnesium tablets if needed  Sleep hygiene. Disc poss OSA and how it affects alertness.  Follow-up 8 weeks.   Meredith Staggers, MD, DFAPA  Please see After Visit Summary for patient specific instructions.  Future Appointments  Date Time Provider  Department Center  10/10/2021  4:00 PM Carlus Pavlov, MD  [facility] None  11/25/2021  3:30 PM Yates Decamp, MD [facility] None    No orders of the defined types were placed in this encounter.     -------------------------------

## 2021-10-10 ENCOUNTER — Encounter: Payer: Self-pay | Admitting: Internal Medicine

## 2021-10-10 ENCOUNTER — Ambulatory Visit (INDEPENDENT_AMBULATORY_CARE_PROVIDER_SITE_OTHER): Payer: 59 | Admitting: Internal Medicine

## 2021-10-10 ENCOUNTER — Other Ambulatory Visit: Payer: Self-pay

## 2021-10-10 VITALS — BP 128/80 | HR 95 | Ht 69.0 in | Wt 239.0 lb

## 2021-10-10 DIAGNOSIS — E061 Subacute thyroiditis: Secondary | ICD-10-CM | POA: Diagnosis not present

## 2021-10-10 DIAGNOSIS — E669 Obesity, unspecified: Secondary | ICD-10-CM

## 2021-10-10 DIAGNOSIS — E782 Mixed hyperlipidemia: Secondary | ICD-10-CM | POA: Diagnosis not present

## 2021-10-10 DIAGNOSIS — E1165 Type 2 diabetes mellitus with hyperglycemia: Secondary | ICD-10-CM

## 2021-10-10 LAB — POCT GLYCOSYLATED HEMOGLOBIN (HGB A1C): Hemoglobin A1C: 9.1 % — AB (ref 4.0–5.6)

## 2021-10-10 MED ORDER — DAPAGLIFLOZIN PROPANEDIOL 5 MG PO TABS: 5.0000 mg | ORAL_TABLET | Freq: Every day | ORAL | 3 refills | Status: DC

## 2021-10-10 NOTE — Patient Instructions (Addendum)
Please continue: - Metformin ER 2000 mg with dinner - Novolog before meals: 35-45 units 4x a day  Please continue: - Lantus 60 units at bedtime   Try to start:  - Farxiga 5 mg in am, before b'fast  Please return in 3 months with your sugar log.

## 2021-10-10 NOTE — Progress Notes (Signed)
Patient ID: NAOD SWEETLAND, male   DOB: 12/05/1975, 45 y.o.   MRN: [id number]  This visit occurred during the SARS-CoV-2 public health emergency.  Safety protocols were in place, including screening questions prior to the visit, additional usage of staff PPE, and extensive cleaning of exam room while observing appropriate contact time as indicated for disinfecting solutions.   HPI: Cody Short is a 45 y.o.-year-old male, presenting for follow-up for DM2, dx in ~2010, insulin-dependent, uncontrolled, without long term complications. Last visit 5 months ago.  Interim history: No increased urination, blurry vision, nausea, chest pain. He continues Jordan.  He had to stop Topamax in the past due to nausea. Before he had muscle cramps/abdominal pain and lipase was found to be elevated again, at 67 (11-51) in 04/2021.  At that time, he also had transaminitis and an increase CK at 562 (49-347).   He also developed chest tightness 05/2021 and saw Dr. Jacinto Halim.  He had CAC score of 0 (07/11/2021). He recently was found to have worse lipids in 10/22.  He was started on Repatha >> now off. He and his entire family had Covid last month.  He had COVID, but this resolved.   After his birthday a month ago, he started to eat more sweets and increased snacks.  Sugars worsened significantly and they remain high throughout the holidays.  He also is trying to switch to a daytime circadian rhythm and was initially eating 4 meals a day and snacks but now reduced the meals to only 3 a day.  He still has some snacks which he is trying to cut out.  Sugars remain quite high especially after meals with sweets (300s).  DM2: Patient's diabetes is directly correlated with his bipolar disease treatment: In the past he had frequent urination resolved after stopping lithium.  While transitioning off lithium, his Saphris dose was increased the blood sugars started to increase afterwards.    He was then taken off  Saphris and started on Loxitane.  This was making him sleepier, but was not increasing his blood sugars.  In fact, sugars started to improve and he was able to reduce his insulin doses.   She had to stop Loxitane 2/2 increased BP and pulse.  In summer 2021, he switched to Caplyta >> sugars improved significantly on this.  In fact, he tells me that he was able to come off insulin completely while on this.  However, this did not work for him -was having anger outbursts on it.   He started on Latuda >> feeling better on this. However, on this, he was eating more >> sugars increased.  Also, he gained weight.   He was on an appetite suppressant (Topamax). This was causing nausea >> stopped.  Reviewed HbA1c levels: Lab Results  Component Value Date   HGBA1C 7.4 (A) 05/14/2021   HGBA1C 7.3 (A) 01/18/2021   HGBA1C 6.9 (A) 09/14/2020  05/07/2021: HbA1c 9.3% 12/22/2018: HbA1c 7.2%  Pt is on a regimen of: - Metformin ER 2000 mg at dinnertime - Novolog before meals: 0 >> 15-30 units before L and D >> 28-35 units 2-3x >> 35-45 units 3x a day before meals - Lantus 15 >> 0 >> 25 >> 40 >> 60-75 units at bedtime He could not tolerate Ozempic >> nausea, AP We had to stop Jardiance due to increased urination. He was previously on Lantus but stopped since sugars improved. He tried Glipizide >> hypoglycemia in the 40s repeatedly. Lowest: 25. Also, Glipizide 2.5 mg  in am >> nausea, vomiting, constipation We tried Invokana >> bothersome urination (when he was working) >> had to stop. He had pancreatitis 2/2 Depakote and HTG in the past. He stopped Actos b/c stomach pain. >>  We stopped Cycloset >> inefficient, could not use b/c price.  Pt checks his sugars 4 times a day per review of his log: - am:    114-175 >> 94-149, 192 >> 95, 110-197 >> 131-254 >> 119-208, 268 - 2h after b'fast: 247 >> n/c >> 100-166, 244 >> 101 >> 162, 173, 325 >> n/c - lunch:  n/c >> 219 >> 116, 166 >> 161, 253, 264 >> 172-259  >> 175-260, 343 - 2h after lunch:  71-132, 171 >> 140-262, 310 >> 110-202, 290 >> 143, 144 - before dinner:  102-188 >> 124-237 >> 114-337 >> 97, 106-291, 330 - 2h after dinner:  133--269 >> 87-134 >> 106-236 >> n/c >> 140-278, 340, 354 - bedtime:  119-207 >> 80-160, 190 >> 107-180 >> n/c - nighttime: 89-181 >> see above >> 134 >> 83, 95 >> n/c Lowest sugar was 50s >> 75 >> 71 >> 69 >> 90  >> 71; he has hypoglycemia awareness in the 70s. Highest sugar was 399 >> 468 >> 244 >> 310 >> 337 >> 354.  Glucometer: FreeStyle Lite (he likes this)  No history of CKD, last BUN/creatinine:  Lab Results  Component Value Date   BUN 12 05/17/2021   CREATININE 1.10 05/17/2021   He had MAU in the past, improved at last check: Lab Results  Component Value Date   MICRALBCREAT 35.2 (H) 05/10/2020   MICRALBCREAT 26.7 04/02/2018   MICRALBCREAT 3.7 02/10/2017   MICRALBCREAT 27.3 12/17/2015   MICRALBCREAT 4.0 02/22/2015  12/22/2018: 262 On Diovan.  He has mixed hyperlipidemia: Lab Results  Component Value Date   CHOL 232 (H) 07/31/2021   HDL 38 (L) 07/31/2021   LDLCALC 136 (H) 07/31/2021   LDLDIRECT 120.0 04/02/2018   TRIG 319 (H) 07/31/2021   CHOLHDL 4.6 08/06/2020  05/03/2020: 225/345/38/126 11/23/2019: 109/148/34/49 12/22/2018: 220/359/41/145 On fenofibrate 145.  He tried Repatha. Zetia, Rosuvastatin caused leg cramps, reportedly elevated CK (900s).  Most recent CK was 562.  - last eye exam was in 03/2017: No DR  -+ Numbness but no tingling in his feet  In 2010, he developed pancreatitis from Depakote.  He has a history of scrotal rash-improved on Nystatin + Triamcinolone.  He may need refills in the future. He has recurrent groin furunculosis.  Subclinical thyrotoxicosis: TSH levels were reviewed: 05/07/2021: TSH 1.21 Lab Results  Component Value Date   TSH <0.01 (L) 01/18/2021   TSH <0.01 (L) 12/11/2020   TSH 0.02 (L) 09/14/2020   TSH 4.03 12/17/2015   TSH 2.08 01/03/2014   TSH  5.15 11/20/2011   TSH 2.15 07/04/2010   TSH 3.82 12/12/2009   TSH 1.81 10/29/2007   FREET4 1.13 01/18/2021   FREET4 1.56 12/11/2020   FREET4 1.41 09/14/2020   T3FREE 3.5 01/18/2021   T3FREE 4.1 12/11/2020   T3FREE 3.9 09/14/2020  Previously: 08/06/2020: TSH 0.079 05/03/2020: TSH 0.528 12/22/2018: 3.648  We checked a thyroid uptake and scan (01/02/2021) and this was consistent with thyroiditis: The thyroid scan is unremarkable. No hot or cold thyroid nodules are identified. 4 hour I-123 uptake = 2.7% (normal 5-20%) 24 hour I-123 uptake = 5.4% (normal 10-30%)   IMPRESSION: Low 4 hour and 24 hour or I 123 uptake may suggest thyroiditis.   Pt denies: - feeling nodules in  neck - hoarseness - dysphagia - choking - SOB with lying down  He denies hypothyroid symptoms.  ROS:+ see HPI  I reviewed pt's medications, allergies, PMH, social hx, family hx, and changes were documented in the history of present illness. Otherwise, unchanged from my initial visit note.  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 Recurs if cannot walk for a week.      TOBACCO ABUSE, HX OF 07/31/2009   Past Surgical History:  Procedure Laterality Date   pilar cystectomy     History   Social History   Marital Status: Single    Spouse Name: N/A   Number of Children: 0   Occupational History      Social History Main Topics   Smoking status: Former Smoker - quit in 2010   Smokeless tobacco: Never Used   Alcohol Use: No     Comment: none at all   Drug Use: No   Current Outpatient Medications  Medication Sig Dispense Refill   Blood Glucose Monitoring Suppl (FREESTYLE LITE) DEVI Use as instructed to check sugar 4 times daily 1 each 0   Evolocumab (REPATHA) 140  MG/ML SOSY Inject 1 pen into the skin every 14 (fourteen) days. (Patient not taking: Reported on 07/30/2021) 6 mL 3   glucose blood (FREESTYLE LITE) test strip Use as instructed to check sugar 4 times daily 400 each 3   hydrOXYzine (ATARAX) 10 MG tablet Take 1 tablet (10 mg total) by mouth 3 (three) times daily as needed. 270 tablet 0   insulin aspart (NOVOLOG FLEXPEN) 100 UNIT/ML FlexPen Inject 35-45 Units into the skin in the morning, at noon, in the evening, and at bedtime. 45 mL 11   Insulin Pen Needle (BD PEN NEEDLE NANO U/F) 32G X 4 MM MISC USE 4 TIMES DAILY WITH INSULIN PENS 400 each 0   LANTUS SOLOSTAR 100 UNIT/ML Solostar Pen Inject 54 Units into the skin daily. 60 mL 3   lurasidone (LATUDA) 80 MG TABS tablet Take 1 tablet (80 mg total) by mouth daily with breakfast. 90 tablet 0   metFORMIN (GLUCOPHAGE-XR) 500 MG 24 hr tablet TAKE 4 TABLETS (2,000 MG TOTAL) BY MOUTH DAILY WITH SUPPER. 360 tablet 2   OneTouch Delica Lancets 30G MISC 1 each by Does not apply route 4 (four) times daily. Use Onetouch Delica lancets to check blood sugar 4 times daily. DX:E11.65 400 each 11   valsartan (DIOVAN) 160 MG tablet Take 160 mg by mouth daily.     Current Facility-Administered Medications  Medication Dose Route Frequency Provider Last Rate Last Admin   Evolocumab SOAJ 140 mg  140 mg Subcutaneous Once Yates Decamp, MD        No current facility-administered medications on file prior to visit.    Allergies  Allergen Reactions   Tricor [Fenofibrate] Other (See Comments)    Myopathy   Divalproex Sodium Other (See Comments)    unknown   Methylphenidate Hcl     Aggravate bipolar   Oxycodone Hcl     REACTION: hallucinations   Ozempic (0.25 Or 0.5 Mg-Dose) [Semaglutide(0.25 Or 0.5mg -Dos)] Nausea Only   Paroxetine     Aggravate bipolar disorder   Rosuvastatin Other (See Comments)    Myopathy -muscle cramps, elevated  CK   Family History  Problem Relation Age of Onset   Diabetes Mother    Breast  cancer Mother        was told not hereditary   Cirrhosis Mother        fatty liver   Diabetes Father    Melanoma Maternal Uncle    Colon cancer Neg Hx    Stomach cancer Neg Hx    Pancreatitis Neg Hx    Heart disease Neg Hx    Kidney disease Neg Hx    Liver disease Neg Hx    PE: BP 128/80 (BP Location: Right Arm, Patient Position: Sitting, Cuff Size: Normal)    Pulse 95    Ht 5\' 9"  (1.753 m)    Wt 239 lb (108.4 kg)    SpO2 94%    BMI 35.29 kg/m   Wt Readings from Last 3 Encounters:  10/10/21 239 lb (108.4 kg)  07/29/21 233 lb 6.4 oz (105.9 kg)  06/19/21 232 lb (105.2 kg)   Constitutional: overweight, in NAD Eyes: PERRLA, EOMI, no exophthalmos ENT: moist mucous membranes, no thyromegaly, no cervical lymphadenopathy Cardiovascular: tachycardia, RR, No MRG Respiratory: CTA B Gastrointestinal: abdomen soft, NT, ND, BS+ Musculoskeletal: no deformities, strength intact in all 4 Skin: moist, warm, no rashes Neurological: no tremor with outstretched hands, DTR normal in all 4  ASSESSMENT: 1. DM2, insulin-dependent, uncontrolled, without long term complications, but with hyperglycemia  His test were negative for type 1 diabetes: Component     Latest Ref Rng 05/28/2015  C-Peptide     0.80 - 3.90 ng/mL 2.88  Glucose, Fasting     65 - 99 mg/dL 07/28/2015 (H)  Glutamic Acid Decarb Ab     <5 IU/mL <5  Pancreatic Islet Cell Antibody     < 5 JDF Units <5   - His diabetes is difficult to manage because of multiple intolerances: - We tried to add Invokana but he did not tolerated due to increased urination.  - We cannot use a DPP 4 inhibitor or a GLP-1 receptor agonist due to his history of pancreatitis.  - He had to stop Actos >>  abd. Pain - we stopped Cycloset >> expensive and not effective - we tried Glipizide >> GI sxs: N/V/D - he tried the Dexcom G6 CGM but he felt that this was giving him a lot of errors and stopped  2. HTG  3.  Obesity class I  4.  Subacute thyroiditis  PLAN:   1. Patient with longstanding, uncontrolled, type 2 diabetes, with worsening control before last visit, but improved control up to approximately 1 month ago, around the time of his birthday.  At that time, he started to eat more sweets and snacks.  Sugars started to increase immediately and they fluctuate up to 200s and 300s he is also trying to switch to daytime circadian rhythm now with the purpose of finding a job, and started to eat more meals a day (4 meals compared to previously 2 meals when he was being up at night).  He lately decreased to only eat 3 meals a day.  He still has some snacks.  I advised him to stop them.   -He is using a higher dose of NovoLog and, comparatively, a lower dose of Lantus, but still higher than before.  At this visit, we discussed that after he improved his meals, we will most likely be able to reduce the NovoLog dose.  We also discussed the possibility of starting an  SGLT2 inhibitor.  He agrees to try a low-dose of Farxiga. -I advised him that if he does not get good results with this, we can also try acarbose.  I explained that this works by reducing how many carbs he is absorbing from his carbs.  This comes with the possible side effect of abdominal discomfort and gas, but he may be able to tolerate it well.  He is open to try it, and will let me know if he wants to do so. - I advised him to: Patient Instructions  Please continue: - Metformin ER 2000 mg with dinner - Novolog before meals: 35-45 units 4x a day  Please continue: - Lantus 60 units at bedtime   Try to start:  - Farxiga 5 mg in am, before b'fast  Please return in 3 months with your sugar log.  - we checked his HbA1c: 9.1% (higher) - advised to check sugars at different times of the day - 4x a day, rotating check times - advised for yearly eye exams >> he is not UTD - return to clinic in 3 months  2. HL -He has a history of hypertriglyceridemia Reviewed latest lipid panel from 07/2021: LDL  high, transcribes also high, HDL low: Lab Results  Component Value Date   CHOL 232 (H) 07/31/2021   HDL 38 (L) 07/31/2021   LDLCALC 136 (H) 07/31/2021   LDLDIRECT 120.0 04/02/2018   TRIG 319 (H) 07/31/2021   CHOLHDL 4.6 08/06/2020  - was on fenofibrate 145 mg daily without side effects.  I also recommended to look up the "portfolio diet" for further improvement in his lipids.  He did not try this yet. -he still has mm cramps and a recently checked CK was elevated -He will start Repatha 10/2021.  He had 1 dose which he tolerated well.  3.  Obesity class I -He gained a significant amount of weight on Latuda -At last visit, he was preparing to start exercise. -Unfortunately, he was sick since then and was not able to start -We are not able to start Ozempic due to history of pancreatitis.  He discussed about trying a low dose and watching him closely, but he was reticent to start -he gained 11 lbs since last OV -We discussed about the importance of cutting down sweets and stopping snacks.  4.  Subacute thyroiditis -TSI's were normal -Diagnosis of thyroiditis was established by thyroid uptake and scan -Most recent TFTs from a week prior to our last appointment were normal per patient's report -We will repeat his TFTs now  Component     Latest Ref Rng & Units 10/10/2021          TSH     0.35 - 5.50 uIU/mL 1.11  T4,Free(Direct)     0.60 - 1.60 ng/dL 9.93  Triiodothyronine,Free,Serum     2.3 - 4.2 pg/mL 3.7  Normal TFTs.   Carlus Pavlov, MD PhD [facility]

## 2021-10-11 ENCOUNTER — Encounter: Payer: Self-pay | Admitting: Internal Medicine

## 2021-10-11 LAB — TSH: TSH: 1.11 u[IU]/mL (ref 0.35–5.50)

## 2021-10-11 LAB — T3, FREE: T3, Free: 3.7 pg/mL (ref 2.3–4.2)

## 2021-10-11 LAB — T4, FREE: Free T4: 0.76 ng/dL (ref 0.60–1.60)

## 2021-10-29 ENCOUNTER — Encounter: Payer: Self-pay | Admitting: Cardiology

## 2021-10-31 NOTE — Telephone Encounter (Signed)
From pt

## 2021-11-04 ENCOUNTER — Other Ambulatory Visit: Payer: Self-pay

## 2021-11-04 DIAGNOSIS — E7800 Pure hypercholesterolemia, unspecified: Secondary | ICD-10-CM

## 2021-11-04 DIAGNOSIS — G720 Drug-induced myopathy: Secondary | ICD-10-CM

## 2021-11-04 MED ORDER — REPATHA 140 MG/ML ~~LOC~~ SOSY: 1.0000 "pen " | PREFILLED_SYRINGE | SUBCUTANEOUS | 3 refills | Status: DC

## 2021-11-10 ENCOUNTER — Encounter: Payer: Self-pay | Admitting: Cardiology

## 2021-11-11 ENCOUNTER — Encounter: Payer: Self-pay | Admitting: Internal Medicine

## 2021-11-11 NOTE — Telephone Encounter (Signed)
From pt

## 2021-11-12 ENCOUNTER — Telehealth: Payer: Self-pay | Admitting: Pharmacist

## 2021-11-12 NOTE — Telephone Encounter (Signed)
He has statin associated rhabdomyolysis. So unable to use statins

## 2021-11-13 ENCOUNTER — Encounter: Payer: Self-pay | Admitting: Cardiology

## 2021-11-13 NOTE — Telephone Encounter (Signed)
Labs 11/13/2021:  Iron, folate and B12 levels normal.  Total cholesterol 242, triglycerides 383, HDL 34, LDL 131.  Hb 14.1/HCT 41.9, platelets 201.  Normal indicis.  Sodium 4.7, BUN 13, creatinine 1.18, EGFR >60 mL, serum glucose 166.  CMP otherwise normal.

## 2021-11-13 NOTE — Telephone Encounter (Signed)
From patient.

## 2021-11-14 ENCOUNTER — Telehealth: Payer: Self-pay | Admitting: Psychiatry

## 2021-11-14 NOTE — Telephone Encounter (Signed)
Please review and advise.

## 2021-11-14 NOTE — Telephone Encounter (Signed)
Have him get me a copy of the last CK levels.  The last one I can see in epic is in July.  Apparently he has had further blood tests since then.  They are not visible to me.  We will not make any med change until I can review those lab tests.

## 2021-11-14 NOTE — Telephone Encounter (Signed)
Reviewed with pt. Pt reports that he has previously tried and failed zetia (elevated CK w/i 1 week of new start), crestor (myalgia) symptoms, fenofibrate (elevated CK?). Recent CK levels trending down (925 (04/26/21) >>562 (05/17/21) >>338 (11/13/21)) since stopping zetia and fenofibrate. Pt reports that he has been working on increasing his fluid intake. Still having moderate muscle aches and pain complains.   Pt scheduled to follow up w/ PCP later today. Will discuss timeline to repeat CK labs. Pt open to starting Welchol 625 mg BID once CK levels are back WNL. May consider also adding nexlitol.   Pt willing to also focus on heart healthy diet, improving his physical activity (once muscle aches and pain improves) in the meantime. Working with his psychiatrist to review possible cardiometabolic side effects of his antipsychotics.   Pt to call back to review plan moving forward of when he plans on getting his CK levels rechecked and if WNL, adding welchol and/or nexlitol. Nexlitol would require PA and appeal. Welchol in pt's formulary.

## 2021-11-14 NOTE — Telephone Encounter (Signed)
Pt LVM stating that he had been to his cardiologist recently. The cardiologist told him that Latuda can increase CK levels and his CK levels have been going up.  He has been experiencing severe muscle cramps and want to know what Dr. Jennelle Human thinks.  Next appt 2/15

## 2021-11-15 NOTE — Telephone Encounter (Signed)
Patient's PCP is Maryelizabeth Rowan. He has asked that she send [place] lab results.

## 2021-11-18 ENCOUNTER — Telehealth: Payer: Self-pay | Admitting: Psychiatry

## 2021-11-18 ENCOUNTER — Encounter: Payer: Self-pay | Admitting: Cardiology

## 2021-11-18 NOTE — Telephone Encounter (Signed)
Pt LVM stating that his doc has sent the CK Levels to you a few days ago.  He would like to know your thoughts based on the results.   Next appt 2/15

## 2021-11-18 NOTE — Telephone Encounter (Signed)
TC to Dr. Maryelizabeth Rowan PCP.  Left message regarding patient's elevated CK levels which have been noted to be elevated for at least a year.

## 2021-11-18 NOTE — Telephone Encounter (Signed)
From patient.

## 2021-11-19 NOTE — Telephone Encounter (Signed)
Please review

## 2021-11-20 NOTE — Telephone Encounter (Signed)
I have spoken with his doctor, Dr. Duanne Guess and she and I both agree that his CK levels are improving and he can continue the medications the way he is taking them

## 2021-11-20 NOTE — Telephone Encounter (Signed)
Pt informed.He also wanted me to let you know he had an explosive "bipolar fight " with his mom and said some bad things to the point she may not speak to him again.

## 2021-11-21 ENCOUNTER — Encounter: Payer: Self-pay | Admitting: Internal Medicine

## 2021-11-21 NOTE — Telephone Encounter (Signed)
Have discussed the elevated CK levels with his primary care doctor, Dr. Duanne Guess.  She notes that his CK levels were actually higher last year and have been gradually declining is a trend.  We discussed that the patient has taken 14 different mood stabilizers and all the remaining options have a risk of elevating CK levels.  The patient is not acutely distressed and does not have NMS.  She agrees that it is reasonable to continue to monitor his levels as well as his clinical status without change in medicine at this time

## 2021-11-22 ENCOUNTER — Encounter: Payer: Self-pay | Admitting: Cardiology

## 2021-11-22 ENCOUNTER — Encounter: Payer: Self-pay | Admitting: Internal Medicine

## 2021-11-25 ENCOUNTER — Encounter: Payer: Self-pay | Admitting: Cardiology

## 2021-11-25 ENCOUNTER — Ambulatory Visit: Payer: 59 | Admitting: Cardiology

## 2021-11-25 ENCOUNTER — Other Ambulatory Visit: Payer: Self-pay

## 2021-11-25 VITALS — BP 146/98 | HR 86 | Temp 97.6°F | Resp 16 | Ht 69.0 in | Wt 231.6 lb

## 2021-11-25 DIAGNOSIS — E7800 Pure hypercholesterolemia, unspecified: Secondary | ICD-10-CM

## 2021-11-25 DIAGNOSIS — I10 Essential (primary) hypertension: Secondary | ICD-10-CM

## 2021-11-25 MED ORDER — COLESEVELAM HCL 625 MG PO TABS: 625.0000 mg | ORAL_TABLET | Freq: Two times a day (BID) | ORAL | 3 refills | Status: DC

## 2021-11-25 NOTE — Progress Notes (Signed)
Primary Physician/Referring:  Fanny Bien, MD  Patient ID: Cody Short, male    DOB: 28-Jun-1976, 46 y.o.   MRN: [id number]  Chief Complaint  Patient presents with   Hyperlipidemia   Chest Pain   Follow-up    6 weeks   HPI:    Cody Short  is a 46 y.o. Caucasian male patient with bipolar disorder, hypertension, mixed hyperlipidemia, type 2 diabetes in young which is uncontrolled, obesity, statin intolerance and was on Zetia and fenofibric acid but developed myalgias and elevated CK in July 2022 and fenofibric acid was discontinued. He quit smoking cigarettes in 2010, has now been concerned about cardiac issues and he is trying to lose weight and change his diet.  Due to elevated CK and myalgias patient has been unable to tolerate Zetia, fenofibric acid, statin therapy.  Attempted to start patient on Repatha, however despite appeals this was not covered by insurance.  Patient's PCP has been monitoring CK levels, most recently total CK at 338 on 11/13/2021.  Patient's myalgias have improved since stopping Zetia, however he does continue to have mild myalgias.  Patient has had no recurrence of chest pain.  He states he is working to make diet and lifestyle modifications.  He is interviewing for shelf stocking position tomorrow which will increase physical activity.  He also recently started [place] and has lost approximately 6 pounds in the last 3 weeks.  Patient has previously undergone echocardiogram which was without significant abnormalities, stress test which was overall low risk, and coronary calcium scoring with total score of 0.  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 Recurs if cannot walk for a week.      TOBACCO  ABUSE, HX OF 07/31/2009   Past Surgical History:  Procedure Laterality Date   pilar cystectomy     Family History  Problem Relation Age of Onset   Diabetes Mother    Breast cancer Mother        was told not hereditary   Cirrhosis Mother        fatty liver   Diabetes Father    Melanoma Maternal Uncle    Colon cancer Neg Hx    Stomach cancer Neg Hx    Pancreatitis Neg Hx    Heart disease Neg Hx    Kidney disease Neg Hx    Liver disease Neg Hx     Social History   Tobacco Use   Smoking status: Former    Packs/day: 2.50    Years: 10.00    Pack years: 25.00    Types: Cigarettes    Quit date: 10/27/2006    Years since quitting: 15.0   Smokeless tobacco: Never  Substance Use Topics   Alcohol use: No    Alcohol/week: 0.0 standard drinks    Comment: none at all   Marital Status: Single  ROS  Review of Systems  Constitutional: Positive for weight loss.  Cardiovascular:  Negative for chest pain, dyspnea on exertion and leg swelling.  Gastrointestinal:  Negative for melena.  Psychiatric/Behavioral:  Positive for depression. The patient is nervous/anxious.   Objective  Blood pressure (!) 146/98, pulse 86, temperature 97.6 F (36.4 C), temperature source Temporal, resp. rate 16, height 5\' 9"  (1.753  m), weight 231 lb 9.6 oz (105.1 kg), SpO2 97 %. Body mass index is 34.2 kg/m.  Vitals with BMI 11/25/2021 11/25/2021 10/10/2021  Height - 5\' 9"  5\' 9"   Weight - 231 lbs 10 oz 239 lbs  BMI - [id number]  Systolic [id number]  Diastolic 98 78 80  Pulse - 86 95  Some encounter information is confidential and restricted. Go to Review Flowsheets activity to see all data.    Physical Exam Constitutional:      Appearance: He is obese.  Neck:     Vascular: No carotid bruit or JVD.  Cardiovascular:     Rate and Rhythm: Normal rate and regular rhythm.     Pulses: Intact distal pulses.     Heart sounds: Normal heart sounds. No murmur heard.   No gallop.  Pulmonary:     Effort:  Pulmonary effort is normal.     Breath sounds: Normal breath sounds.  Abdominal:     General: Bowel sounds are normal.     Palpations: Abdomen is soft.  Musculoskeletal:        General: No swelling.  Physical exam unchanged compared to previous office visit.  Laboratory examination:   Recent Labs    05/17/21 1952  NA 139  K 3.8  CL 105  CO2 23  GLUCOSE 88  BUN 12  CREATININE 1.10  CALCIUM 9.2  GFRNONAA >60   CrCl cannot be calculated (Patient's most recent lab result is older than the maximum 21 days allowed.).  CMP Latest Ref Rng & Units 05/17/2021 06/18/2018 04/02/2018  Glucose 70 - 99 mg/dL 88 129(H) 166(H)  BUN 6 - 20 mg/dL 12 14 12   Creatinine 0.61 - 1.24 mg/dL 1.10 1.24 1.10  Sodium 135 - 145 mmol/L 139 140 137  Potassium 3.5 - 5.1 mmol/L 3.8 4.5 4.4  Chloride 98 - 111 mmol/L 105 107 108  CO2 22 - 32 mmol/L 23 19(L) 20  Calcium 8.9 - 10.3 mg/dL 9.2 10.3 10.2  Total Protein 6.5 - 8.1 g/dL 7.5 8.3(H) 7.9  Total Bilirubin 0.3 - 1.2 mg/dL 0.3 0.4 0.4  Alkaline Phos 38 - 126 U/L 65 - -  AST 15 - 41 U/L 54(H) 26 35  ALT 0 - 44 U/L 45(H) 24 53(H)   CBC Latest Ref Rng & Units 05/17/2021 06/27/2016 12/17/2015  WBC 4.0 - 10.5 K/uL 7.9 11.3(H) 7.2  Hemoglobin 13.0 - 17.0 g/dL 13.6 13.5 12.9(L)  Hematocrit 39.0 - 52.0 % 40.8 39.0 38.6(L)  Platelets 150 - 400 K/uL 243 248.0 231.0    Lipid Panel Recent Labs    07/31/21 0810  CHOL 232*  TRIG 319*  LDLCALC 136*  HDL 38*   Lipid Panel     Component Value Date/Time   CHOL 232 (H) 07/31/2021 0810   TRIG 319 (H) 07/31/2021 0810   HDL 38 (L) 07/31/2021 0810   CHOLHDL 4.6 08/06/2020 1017   CHOLHDL 6 04/02/2018 1551   VLDL 68.2 (H) 04/02/2018 1551   LDLCALC 136 (H) 07/31/2021 0810   LDLDIRECT 120.0 04/02/2018 1551   LABVLDL 58 (H) 07/31/2021 0810     HEMOGLOBIN A1C Lab Results  Component Value Date   HGBA1C 9.1 (A) 10/10/2021   TSH Recent Labs    12/11/20 1455 01/18/21 1549 10/10/21 1625  TSH <0.01* <0.01* 1.11   01/18/2021: Free T4 1.13, normal and free T3 3.5, normal.  Component Ref Range & Units 05/17/2021  Total CK 49 - 397 U/L 562 High  External labs:  11/13/2021: Total CK 338 (normal)  Labs 03/14/2021:  Total cholesterol: 226, triglycerides: 41, LDL 128.  Non-HDL cholesterol 185.  Total CK3 124.  Labs 05/01/2021: Total CK 429.   CPK 925 on 04/26/2021.  Labs 12/22/2018:  Total cholesterol 220, triglycerides 359, HDL 41, LDL 145.  Allergies   Allergies  Allergen Reactions   Tricor [Fenofibrate] Other (See Comments)    Myopathy   Divalproex Sodium Other (See Comments)    unknown   Methylphenidate Hcl     Aggravate bipolar   Oxycodone Hcl     REACTION: hallucinations   Ozempic (0.25 Or 0.5 Mg-Dose) [Semaglutide(0.25 Or 0.5mg -Dos)] Nausea Only   Paroxetine     Aggravate bipolar disorder   Rosuvastatin Other (See Comments)    Myopathy -muscle cramps, elevated CK    Medication prior to this encounter:   Outpatient Medications Prior to Visit  Medication Sig Dispense Refill   Blood Glucose Monitoring Suppl (FREESTYLE LITE) DEVI Use as instructed to check sugar 4 times daily 1 each 0   dapagliflozin propanediol (FARXIGA) 5 MG TABS tablet Take 1 tablet (5 mg total) by mouth daily before breakfast. 90 tablet 3   glucose blood (FREESTYLE LITE) test strip Use as instructed to check sugar 4 times daily 400 each 3   hydrOXYzine (ATARAX) 10 MG tablet Take 1 tablet (10 mg total) by mouth 3 (three) times daily as needed. 270 tablet 0   insulin aspart (NOVOLOG FLEXPEN) 100 UNIT/ML FlexPen Inject 35-45 Units into the skin in the morning, at noon, in the evening, and at bedtime. (Patient taking differently: Inject 10 Units into the skin 3 (three) times daily with meals.) 45 mL 11   Insulin Pen Needle (BD PEN NEEDLE NANO U/F) 32G X 4 MM MISC USE 4 TIMES DAILY WITH INSULIN PENS 400 each 0   LANTUS SOLOSTAR 100 UNIT/ML Solostar Pen Inject 54 Units into the skin daily. (Patient taking  differently: Inject 45 Units into the skin daily.) 60 mL 3   lurasidone (LATUDA) 80 MG TABS tablet Take 1 tablet (80 mg total) by mouth daily with breakfast. 90 tablet 0   metFORMIN (GLUCOPHAGE-XR) 500 MG 24 hr tablet TAKE 4 TABLETS (2,000 MG TOTAL) BY MOUTH DAILY WITH SUPPER. 360 tablet 2   OneTouch Delica Lancets 99991111 MISC 1 each by Does not apply route 4 (four) times daily. Use Onetouch Delica lancets to check blood sugar 4 times daily. DX:E11.65 400 each 11   valsartan (DIOVAN) 160 MG tablet Take 160 mg by mouth daily.     No facility-administered medications prior to visit.    Medication list after today's encounter   Current Outpatient Medications  Medication Instructions   Blood Glucose Monitoring Suppl (FREESTYLE LITE) DEVI Use as instructed to check sugar 4 times daily   colesevelam (WELCHOL) 625 mg, Oral, 2 times daily with meals   dapagliflozin propanediol (FARXIGA) 5 mg, Oral, Daily before breakfast   glucose blood (FREESTYLE LITE) test strip Use as instructed to check sugar 4 times daily   hydrOXYzine (ATARAX) 10 mg, Oral, 3 times daily PRN   Insulin Pen Needle (BD PEN NEEDLE NANO U/F) 32G X 4 MM MISC USE 4 TIMES DAILY WITH INSULIN PENS   Lantus SoloStar 54 Units, Subcutaneous, Daily   lurasidone (LATUDA) 80 mg, Oral, Daily with breakfast   metFORMIN (GLUCOPHAGE-XR) 2,000 mg, Oral, Daily with supper   NovoLOG FlexPen 35-45 Units, Subcutaneous, 4 times daily   OneTouch Delica Lancets 99991111 MISC 1 each, Does  not apply, 4 times daily, Use Onetouch Delica lancets to check blood sugar 4 times daily. DX:E11.65   valsartan (DIOVAN) 160 mg, Oral, Daily    Radiology:   No results found.  Cardiac Studies:   Coronary calcium score 07/09/2021: Coronary calcium score of 0.  Ascending and descending thoracic aorta within normal limits.  No significant extracardiac abnormality.  PCV ECHOCARDIOGRAM COMPLETE 07/17/2021 Left ventricle cavity is normal in size and wall thickness. Normal  global wall motion. Normal LV systolic function with visual EF 50-55%. Doppler evidence of grade I (impaired) diastolic dysfunction, normal LAP. Thin interatrial septum without 2D or color Doppler evidence of shunting. Mild tricuspid regurgitation. Estimated pulmonary artery systolic pressure 31 mmHg.    PCV MYOCARDIAL PERFUSION WO LEXISCAN 07/17/2021 Exercise nuclear stress test was performed using Bruce protocol. Patient reached 10 METS, and 92% of age predicted maximum heart rate. Exercise capacity was good. No chest pain reported. Heart rate and hemodynamic response were normal. Stress EKG revealed no ischemic changes. Normal myocardial perfusion. Stress LVEF 66%. Low risk study.    EKG:   EKG 06/19/2021: Normal sinus rhythm at rate of 75 bpm, normal axis.  Poor R progression, probably normal variant.  No evidence of ischemia, normal EKG.   Assessment     ICD-10-CM   1. Hypercholesteremia  E78.00     2. Primary hypertension  I10      There are no discontinued medications.  Meds ordered this encounter  Medications   colesevelam (WELCHOL) 625 MG tablet    Sig: Take 1 tablet (625 mg total) by mouth 2 (two) times daily with a meal.    Dispense:  60 tablet    Refill:  3    No orders of the defined types were placed in this encounter.  Recommendations:   Cody Short is a 46 y.o. Caucasian male patient with bipolar disorder, hypertension, mixed hyperlipidemia, type 2 diabetes in young which is uncontrolled, obesity, statin intolerance and was on Zetia and fenofibric acid but developed myalgias and elevated CK in July 2022 (Zetia was added prior to elevation in CK) and fenofibric acid was discontinued  Due to elevated CK and myalgias patient has been unable to tolerate Zetia, fenofibric acid, statin therapy.  Attempted to start patient on Repatha, however despite appeals this was not covered by insurance.  Patient's PCP has been monitoring CK levels, most recently total CK  at 338 on 11/13/2021.    As both patient's CK level and myalgias have improved he is normally to take WelChlor 625 mg p.o. twice daily.  Patient will notify our office if he develops side effects.  We will plan to repeat lipid profile testing with PCP in 2 to 3 months if he tolerates this medication without issue.  Could consider addition of Nexletol if lipids remain uncontrolled, or if he is unable to tolerate WelChlor.   Patient has had no recurrence of chest pain symptoms since last office visit, and these were likely musculoskeletal.  His blood pressure was initially elevated in the office today, improved upon recheck.  Although it is mildly elevated above goal, blood pressure is within acceptable range.  Encourage patient to monitor blood pressure on a regular basis at home and notify our office if it remains >130/80 mmHg.  Patient's blood pressure is typically well controlled, therefore will not make changes to medications at this time.  Again reiterated the importance of diet and lifestyle modifications and weight loss.  Patient is otherwise stable from a  cardiovascular standpoint.  Follow-up in 4 months, sooner if needed for hyperlipidemia.   Alethia Berthold, PA-C 11/25/2021, 3:59 PM Office: [phone]

## 2021-11-25 NOTE — Telephone Encounter (Signed)
From pt

## 2021-11-28 ENCOUNTER — Encounter: Payer: Self-pay | Admitting: Cardiology

## 2021-11-28 DIAGNOSIS — E7800 Pure hypercholesterolemia, unspecified: Secondary | ICD-10-CM

## 2021-11-28 DIAGNOSIS — G720 Drug-induced myopathy: Secondary | ICD-10-CM

## 2021-11-28 NOTE — Telephone Encounter (Signed)
From patient.

## 2021-11-29 MED ORDER — FENOFIBRATE 145 MG PO TABS: 145.0000 mg | ORAL_TABLET | Freq: Every day | ORAL | 2 refills | Status: DC

## 2021-11-29 NOTE — Telephone Encounter (Signed)
ICD-10-CM   1. Hypercholesteremia  E78.00 fenofibrate (TRICOR) 145 MG tablet    CK    2. 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    Dispense:  30 tablet    Refill:  2

## 2021-12-10 ENCOUNTER — Encounter: Payer: Self-pay | Admitting: Cardiology

## 2021-12-10 ENCOUNTER — Encounter: Payer: Self-pay | Admitting: Internal Medicine

## 2021-12-11 ENCOUNTER — Other Ambulatory Visit: Payer: Self-pay | Admitting: Internal Medicine

## 2021-12-11 ENCOUNTER — Telehealth (INDEPENDENT_AMBULATORY_CARE_PROVIDER_SITE_OTHER): Payer: 59 | Admitting: Psychiatry

## 2021-12-11 ENCOUNTER — Encounter: Payer: Self-pay | Admitting: Psychiatry

## 2021-12-11 DIAGNOSIS — F5113 Hypersomnia due to other mental disorder: Secondary | ICD-10-CM | POA: Diagnosis not present

## 2021-12-11 DIAGNOSIS — F1211 Cannabis abuse, in remission: Secondary | ICD-10-CM

## 2021-12-11 DIAGNOSIS — F411 Generalized anxiety disorder: Secondary | ICD-10-CM

## 2021-12-11 DIAGNOSIS — F5105 Insomnia due to other mental disorder: Secondary | ICD-10-CM

## 2021-12-11 DIAGNOSIS — F3110 Bipolar disorder, current episode manic without psychotic features, unspecified: Secondary | ICD-10-CM | POA: Diagnosis not present

## 2021-12-11 DIAGNOSIS — F4001 Agoraphobia with panic disorder: Secondary | ICD-10-CM

## 2021-12-11 MED ORDER — LURASIDONE HCL 80 MG PO TABS: 80.0000 mg | ORAL_TABLET | Freq: Every day | ORAL | 0 refills | Status: DC

## 2021-12-11 MED ORDER — INSULIN GLARGINE-YFGN 100 UNIT/ML ~~LOC~~ SOPN: 60.0000 [IU] | PEN_INJECTOR | Freq: Every day | SUBCUTANEOUS | 3 refills | Status: DC

## 2021-12-11 NOTE — Progress Notes (Signed)
Cody Short [id number] [birth year] 46 y.o.   Virtual Visit via Telephone Note  I connected with pt by telephone and verified that I am speaking with the correct person using two identifiers.   I discussed the limitations, risks, security and privacy concerns of performing an evaluation and management service by telephone and the availability of in person appointments. I also discussed with the patient that there may be a patient responsible charge related to this service. The patient expressed understanding and agreed to proceed.  I discussed the assessment and treatment plan with the patient. The patient was provided an opportunity to ask questions and all were answered. The patient agreed with the plan and demonstrated an understanding of the instructions.   The patient was advised to call back or seek an in-person evaluation if the symptoms worsen or if the condition fails to improve as anticipated.  I provided 30 minutes of non-face-to-face time during this encounter. The patient was located at home and the provider was located office.  Session from  330 until 400   Subjective:   Patient ID:  Cody Short is a 46 y.o. (DOB [birth year]) male.  Chief Complaint:  Chief Complaint  Patient presents with   Follow-up    Bipolar I disorder, most recent episode (or current) manic (HCC)   Manic Behavior   Medication Reaction     Cody Short presents to the office today for follow-up of several psychiatric diagnoses ...    seen August 2020.  For obsessive anxiety  Switched Lexapro 5 mg daily on May 18.  But he stated it made him hungry and so it was stopped in August.  He continued on Saphris 10 mg nightly, lithium 1200 mg daily, propranolol as needed tremor.  Lithium level was ordered.  He called Sept saying he was manic.  Had a lithium level 0.7 and so dose was increased to 1500 mg daily.    seen December 21, 2019.  He had some ongoing manic symptoms and  Saphris was increased to 15 mg nightly.  02/07/2020 appointment with the following noted: He has made multiple phone calls to the office since that date.  He decided he wanted to stop lithium because of urinary problems.  He was warned this could lead to mania but he wanted to pursue it anyway.  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  Cody Short had been having bedwetting episodes which she attributed to the lithium.  He has not consistently taken Saphris 15 mg at bedtime since the office visit in February. He completely stopped lithium January 30, 2020 U frequency is much better and less thirsty.  Bedwetting resolved.  Reduced BM to 1-2 times daily.  Gassy.  Glucose is high.  B is hospitalized with unknown disease.  Insulin adjusted. Mental status is best it's ever been.  Less mania.  Less obsessing.  No worsening mania. Is taking Saphris 15 mg HS> Plan no med changes.  04/10/2020 appointment with the following noted: No longer on lithium.  Still frequent urination and thirst DT DM poorly controlled despite meds.  Endo says little else to take bc history of pancreatitis.  Says he goes to urinate q 15 mins. Plan: Wean Saphris in crossover to loxapine 150 mg HS over 2 weeks bc he and endocrinologist believe it's likely Saphris is cause of the inability to get his glucose to acceptable levels.   06/07/20 appt with the following noted: He's called  a couple of times CO sleepiness and reduced loxapine to 50 mg HS. Started Vegan diet and BS is better 4 weeks ago.  Thinks glucose is better off the Saphris. Lost 6 #.   CO sleepiness and sleep 16 hours   He thinks it also drove up pulse and blood pressure. Asks about taking Saphris 10 and a second medication. Mood has been fine.   Plan: He wants to restart Saphris 10 mg HS and use a 2nd mood stabilizer. Start Saphris and reduce Loxapine  To 10 mg at night DT prior failure of 10 Mg Saphris with  lithium.  06/27/2020 phone call from patient: Pt called to report Loxapine & Saphris is causing his BP to be very high and Saphris is causing blood sugar to be high. Apt 10/18. Need changes before then. Call back @ [phone]  MD response: He had wanted to go back on Saphris and reduce loxapine which we did.  Tell him to stop the Saphris and increase loxapine to 20 mg nightly.  He did not tolerate 50 mg of loxapine very well. Nurse TC withpt: Spoke with Cody Short and he will stop the Saphris, but feels the increase in the Loxapine to 20 mg will increase his blood sugars more. Explained that with coming off the Saphris he needs to increase it a bit or he will not be stable with his moods. Advised him to stop the Saphris and would check with Dr. Jennelle Human about any other recommendations.   07/17/20 appt with the following noted: Doing terrible.  Needs melatonin to sleep.  Claims loxapine is elevating BP, pulse, TG, cholesterol, glucose.  Claimed Saphris did it too.   Sees PCP at end of October.   Started Vegan diet about 6 weeks ago and exercising.   A/P: There are no available mood stabilizers that do not have a warning of elevating blood sugar and cholesterol.  He has tried all the mood stabilizers that do not have this warning.  All of the available mood stabilizers left that have any effectiveness are antipsychotics and the FDA has required a class warning on all antipsychotics about blood sugar and cholesterol effects.  This is true despite the fact that not all antipsychotics are equal at increasing these risks.  The patient's reading of these potential side effects is complicating finding an effective treatment.  The only way to prove whether or not these meds are elevating his blood sugar and cholesterol is to stop the medications for a period of time.  This exposes him to manic risk but there is no chance of finding an adequate mood stabilizer that will satisfy him until we can prove whether or not these  meds are in fact affecting his blood sugar, cholesterol, blood pressure, and pulse. Therefore he will stop the loxapine which is at a low dose. We will check labs in 3 weeks.  We will attempt follow-up in 4 weeks.  If he gets manic he will let us know.   08/13/2020 appointment with the following noted: Blood sugar is pretty much the same off the loxapine but BP and pulse improved from 110 to 70 range.  But had started BP med about mid September. Started counselor Illene Labrador at [facility] and started nutritionsist.  Has changed and restricted diet. Sees PCP 07/24/20.  Endo is managing DM.   Mood has been really good.  Counseling helped the anxiety.  Getting 10 hours of sleep.  PCP suggested changing time going to bed earlier  and it's helping.   Sometimes has racing thoughts and gets comments from others while playing video games that he needs to take a break. Patient reports stable mood and deniesr irritable moods.  Denies appetite disturbance.  Patient reports that energy and motivation have been good.  Patient denies any difficulty with concentration.  Patient denies any suicidal ideation.  Admits he's easily stressed.   10/09/20 appt with following noted: Getting manic as hell and depressed as hell. Talking too much and obsessing over things.  Talking too loud and doing things extremely fast.  Only depressed a couple of days ago.  Manic sx are brief and not all the time. Sleep 7-8 hours and sleep schedule is more normal. Stayed on Caplyta and pleased overall with SE. BS is better but hypertension is ongoing. Low TSH. Still counseling. No delusions hallucinations since off drugs. Give samples of Caplyta 42 mg once daily. Prefer no change befor ethe holidays bc he is not continuously manic or depressed. Likely switch to Jordan after the holidays.  11/19/2020 appointment with the following noted: Best I've ever been except some controllable manic.  Able to do things fast  and easily.  Anxiety medicine and counseling has helped.  Better function.  No SE. Not depressed in a while.  But not sure the Capylta helps mania.   Lost from 230# to 189# with diet and exercise over a long period.   Sleep 4-8 hours, but not that I can't sleep but don't feel like sleeping.  Not itching any more.  I'm a a whole new person.   I can't keep up the mania.  Driving my parents and friends crazy. Blood sugar better and off insulin.  Only taking metformin. Can't afford Caplyta. So feels he must change. Hydroxyzine really helped anxiety. Plan: DC Caplyta DT mania.  switch to Latuda 40 then 80 mg with food.  Pick up samples.  12/03/2020 phone call from parents:Parents called and want dr. Jennelle Human to know that Cody Short is maniac and he is having physical aggression and all he does is play video games 24 hours a day and is not sleeping. Parents feel like Cody Short is not giving you the whole picture and needs an adjustment to his medicine. MD response:atient acknowledged that his appointment on January 24 that he was manic while taking Caplyta.  He also acknowledged that his family saw him as being manic.  We discontinued Caplyta and switched him to Jordan.  If he made the switch and increase the dose in a timely manner he should have been on 80 mg daily for a week now.  His parents are complaining that he is still manic.  Provided he is tolerating the Latuda reasonably well have him increase to 120 mg daily.  He can take 1-1/2 of the 80 mg tablets until he runs out and then we can send in a refill for 120 mg daily.  Remind him he needs to take that with at least 350 cal. Phone call with patient from nurse: Patient rtc and he did report he's doing great on the medication but is still having mania. Reports staying up all night playing video games then asleep about 5 am and back up at noon. He is taking medication with 350 calories also. Advised him to increase to 120 mg Latuda daily. He reports having only 40 mg  tablets at home so instructed him to take 3 tablets and I would get more samples out for him to pick up tomorrow. He  agreed and verbalized understanding.  12/07/2020 phone call: Patient called again about his Latuda. He states that it makes him really hungry after he takes it, he takes a nap and wakes up hungry again. He would like to know would it be okay to take before bed instead. MD response: He is unlikely to comply with recommendations about routine sleep and wake times.  He is in a habit of playing video games late into the night.  If he prefers he can split the dosage of the Latuda between breakfast and another meal.  But it is very important that he get the full dose in every day and take it with 350 cal. Nursing phone call with patient:Rtc to patient and he reports he has to take it all at hs because it makes him sleepy, I advised him to continue that regimen. He also reports not sleeping well but he's been drinking caffeine later in the day. Advised him to not drink any caffeine past 2-3 pm. He agreed. He reports his blood sugars have been elevated some, not as bad but it is up some.   12/20/2020 appointment with the following noted: Mania tremendously better but occ anger outbursts anger.  6 hours of sleep with melatonin, hydroxyzine and Latuda 120 mg for 2 weeks.  Staying up all night playing video games. Cody Short does make him hungry right after he takes it so takes it before bedtime.  Taking with food. Hyperactivity and loud talking is better.  I can function fast.  Parents see benefit with mania but are concerned about his anger outbursts but he things she takes stress out on him.  Blood sugar is generally good except when eats too much.  Taking it with dinner and HS.   Able to drive around town.  Can cook and clean.  Less ruminating on his past.  Less need for direction.   Counseling is helping at [facility] counseling. Hydroxyzine helped itching and anxiety.  Plan: Continue Latuda 120 mg  PM.  Is getting benefit but only been on it for a couple of weeks.   01/17/2021 appointment with following noted: So so.  Some anger outburts a couple of times.  Eats too much.  Blood sugar is pretty bad.  Up at night playing video games and then sleeps too late in the day.  Takes Latuda at evening meal.  Wants to blame meds for these behaviors. Otherwise mood is "good I guess".  No serious highs or lows.  Can get in arguments with mother when she tries to correct him and may feel guilty afterwards. Takes hydroxyzine q 6 hours because of anxiety and bc dx thyroid inflamed, thyroiditis.  Asks if there's anything to help with overeating. Lost weight from thyroid.   Plan: Continue Latuda 120 mg as he is tolerating it and it appears to be controlling mania better than did the Caplyta and he is tolerating it better.  02/12/2021 appointment with the following noted: Perfectly good with mood and depression.  Needs hydroxyzine for itching and anxiety.  Energy poor with thyroiditis. Will sleep too much too bc gets bored and no physical activity. Varies 9-16 hours daily.  Can nap easily.  Not spacy.   No anger lately. Tolerating Latuda well.   Still eats too many sweets late at night and gets high blood sugars.   Plan: Increase  topiramate 50 mg BID off label for weight loss and disc SE.  He wants to try something.  03/20/2021 appointment with the following  noted: Taking meds.  A little benefit with increase topiramate. Mood is perfectly fine without mania nor depression. Blood sugar is a bit high. Claims doctor thinks rash could be related to [facility] but he sleeps all the time bc hypothyroidism. Has folliculitis Started treatment for it and it is helping.   Thyroid problem is a wait and see.  Could take a year to get better. Doesn't think topiramate added tiredness. Not taking hydroxyzine much about once daily. Dealing with difficult GM threatening sui if left alone.  Plan: Continue Latuda 120 mg  PM.  Is getting benefit.. Better tolerated than others so far but he does say it makes him hungry but he has chronic impulse control problems including overeating.  05/06/2021 appointment with the following noted: Quit topiramate DT nausea and not helping at all. Rash resolved. Lipids not terrible but a little up.  TSH normalized. HGBA1C 9.3 Not exercising. Not doing much re: work etc other than video games. Not manic lately. Patient reports stable mood and denies depressed or irritable moods.  Patient denies any recent difficulty with anxiety.   Denies appetite disturbance.  Patient reports that energy and motivation have been good.  Patient denies any difficulty with concentration.  Patient denies any suicidal ideation. Energy is better than it was but still sleeping 12 hours daily and up at night playing videos with friends. Plan: Reduce Latuda to 80 mg to reduce excessive sleepiness.  If mania then increase it back up 120 mg PM.  Is getting benefit..  06/20/21 TC:  Pt stated the cramps have spread to his arms and legs.He has been on latuda 80 mg qd for awhile now.He stated he is also going to see a cardiologist due to heart spasms. MD response:  The cramps are not likely related to Jordan.  Usually it is related to dehydration although I see from chart review that he had elevated creatinine kinase from a statin and that was probably causing some of his problem.  He needs to make sure he drinks lots of water.  Especially because he is diabetic and that will make him prone to dehydration if he is not controlling his blood sugar. However the sleepiness might be related to Jordan.  He had previously been on 120 mg daily and we reduced it to 80 mg daily and if he is Short okay with his mood we can try reducing it 1 more time to 60 mg.  I will send in a prescription for the 60 mg tablet and he can reduce it if he would like to see if the sleepiness is related to Jordan.  If Cody Short is contributing to  sleepiness it will get better if he reduces the dose.  If the sleepiness does not get better with the dosage reduction then Latuda is not the cause Reduce Latuda to 60 mg daily  07/30/2021 appt noted: Not much difference in alertness Mania since reducing Latuda. Hypoactive bc thryroid problems and may be the cause of the cramps per the other doctors. Itches badly without hydroxyzine. Spent $500 on gambling game and adeleted.  Past history of drug induced gambling but no major drugs in 15 years. Still exhausted and sleep all day.  But is taying up at night.  Sleeping 16 hours daily.  Wonders if hydroxyzine 25 is making him tired. Itching worse with stress and stress of GM and B crazy. Asks about bipolar shot. Side sleeper and as far as he knows no excessive snoring.  08/23/2021 phone call  from patient's father stating that Cody Short had "gone off the rails" and it spent $1000 on the night prior doing online gambling and had gone back to staying up all night and sleeping during the day.  He had also been more verbally abusive.  It was suggested that the Latuda dose be increased from 60 to 80 mg in the evening.  10/09/2021 appointment with the following noted: Sleeping a lot and easily exhausted even after med were changed.   SE a little constipation. History of problems with gambling in the past but not current.  Not thinking of it now. No other manic sx currently.   On Latuda 80 daily now.  Reduced hydroxyzine to 10 and still sleepy.  No other psych meds Plan: Continue Latuda 80 mg bc manic with less .  If recurrence of mania then increase it back up 120 mg PM.  Is getting benefit..  11/18/2021 phone call: Patient had complained of elevated CK levels and wondered if it could be related to Latuda.  Dr. Jennelle Human called Dr. Duanne Guess and discussed this issue.  It appears that his CK levels have actually been declining over the last several months and are not at a critical level.  It was agreed that this could  be monitored over time and no immediate med change was necessary.  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   1 episode when got angy and said awful things to mother but then apologized.  Only 1 bad day. Otherwise mood has been pretty stable lately. Constipation and can't take miralax bc gives him diarrhea.   Sleep normalized to 8 hours since started his job.  So the med was not the source of the sleepiness. Counselor next Tuesday Only hydroxyzine is 10 mg HS Only other psych med is Latuda 80 mg daily  Works with [employer].  Prior psychiatric medications: risperidone with cognitive side effects, Abilify NR,  Zyprexa SE glucose, Seroquel 600 mg SE wt, perphenazineSE, Saphris 20 partial response but SE increase glucose , haloperidol EPS, Geodon EPS,  Vraylar, loxapine SE, Caplyta NR, Latuda 120 He's prone to EPS. Depakote (Note patient had severe pancreatitis from Depakote.) , Equetro, topiramate nausea Lithium was stopped early April 2021 bc of diabetes insipidus.  N-acetylcysteine with minimal benefit, , Amantadine with vomiting.  Sertraline and lexapro sexual SE,   Propranolol for tremor, lithium 1500,     Review of Systems:  Review of Systems  Constitutional:  Positive for fatigue.  Endocrine: Positive for polydipsia and polyuria.  Genitourinary:  Negative for decreased urine volume and frequency.  Musculoskeletal:  Positive for myalgias.  Skin:  Positive for rash.       Itching resolved with hydroxyzine  Neurological:  Negative for tremors and weakness.  Psychiatric/Behavioral:  Positive for decreased concentration. Negative for agitation, behavioral problems, confusion,  dysphoric mood, hallucinations, self-injury, sleep disturbance and suicidal ideas. The patient is hyperactive.    Medications: I have reviewed the patient's current medications.  Current Outpatient Medications  Medication Sig Dispense Refill   Blood Glucose Monitoring Suppl (FREESTYLE LITE) DEVI Use as instructed to check sugar 4 times daily 1 each 0   colesevelam (WELCHOL) 625 MG tablet Take  1 tablet (625 mg total) by mouth 2 (two) times daily with a meal. 60 tablet 3   dapagliflozin propanediol (FARXIGA) 5 MG TABS tablet Take 1 tablet (5 mg total) by mouth daily before breakfast. 90 tablet 3   fenofibrate (TRICOR) 145 MG tablet Take 1 tablet (145 mg total) by mouth daily. 30 tablet 2   glucose blood (FREESTYLE LITE) test strip Use as instructed to check sugar 4 times daily 400 each 3   hydrOXYzine (ATARAX) 10 MG tablet Take 1 tablet (10 mg total) by mouth 3 (three) times daily as needed. (Patient taking differently: Take 10 mg by mouth daily.) 270 tablet 0   insulin aspart (NOVOLOG FLEXPEN) 100 UNIT/ML FlexPen Inject 35-45 Units into the skin in the morning, at noon, in the evening, and at bedtime. (Patient taking differently: Inject 10 Units into the skin 3 (three) times daily with meals.) 45 mL 11   insulin glargine-yfgn (SEMGLEE) 100 UNIT/ML Pen Inject 60 Units into the skin at bedtime. 45 mL 3   Insulin Pen Needle (BD PEN NEEDLE NANO U/F) 32G X 4 MM MISC USE 4 TIMES DAILY WITH INSULIN PENS 400 each 0   metFORMIN (GLUCOPHAGE-XR) 500 MG 24 hr tablet TAKE 4 TABLETS (2,000 MG TOTAL) BY MOUTH DAILY WITH SUPPER. 360 tablet 2   OneTouch Delica Lancets 30G MISC 1 each by Does not apply route 4 (four) times daily. Use Onetouch Delica lancets to check blood sugar 4 times daily. DX:E11.65 400 each 11   valsartan (DIOVAN) 160 MG tablet Take 160 mg by mouth daily.     lurasidone (LATUDA) 80 MG TABS tablet Take 1 tablet (80 mg total) by mouth daily with breakfast. 90 tablet 0   No current  facility-administered medications for this visit.    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 Recurs if cannot walk for a week.      TOBACCO ABUSE, HX OF 07/31/2009    Family History  Problem Relation Age of Onset   Diabetes Mother    Breast cancer Mother        was told not hereditary   Cirrhosis Mother        fatty liver   Diabetes Father    Melanoma Maternal Uncle    Colon cancer Neg Hx    Stomach cancer Neg Hx    Pancreatitis Neg Hx    Heart disease Neg Hx    Kidney disease Neg Hx    Liver disease Neg Hx     Social History   Socioeconomic History   Marital status: Single    Spouse name: Not on file   Number of children: 0   Years of education: Not on  file   Highest education level: Not on file  Occupational History   Not on file  Tobacco Use   Smoking status: Former    Packs/day: 2.50    Years: 10.00    Pack years: 25.00    Types: Cigarettes    Quit date: 10/27/2006    Years since quitting: 15.1   Smokeless tobacco: Never  Vaping Use   Vaping Use: Never used  Substance and Sexual Activity   Alcohol use: No    Alcohol/week: 0.0 standard drinks    Comment: none at all   Drug use: No   Sexual activity: Not on file  Other Topics  Concern   Not on file  Social History Narrative   Single. Not dating currently. No kids.    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[employer], for dad's company- [employer]      Hobbies: video games PC   Social Determinants of Corporate investment bankerHealth   Financial Resource Strain: Not on [employer] Corporationfile  Food Insecurity: Not on file  Transportation Needs: Not on file  Physical Activity: Not on file  Stress: Not on file  Social Connections: Not on file  Intimate Partner Violence: Not on file    Past Medical History, Surgical history, Social history, and Family history were reviewed and updated as appropriate.   Please see review of systems for further details on the patient's review from today.   Objective:   Physical Exam:  There were no vitals taken for this visit.  Physical Exam Neurological:     Mental Status: He is alert and oriented to person, place, and time.     Cranial Nerves: No dysarthria.  Psychiatric:        Attention and Perception: Attention normal.        Mood and Affect: Mood is anxious. Mood is not depressed or elated.        Speech: Speech is not rapid and pressured or slurred.        Behavior: Behavior is not agitated, aggressive or hyperactive. Behavior is cooperative.        Thought Content: Thought content is not paranoid or delusional. Thought content does not include homicidal or suicidal ideation. Thought content does not include suicidal plan.        Cognition and Memory: Cognition and memory normal.     Comments: No paranoia.   Insight and judgment fair to poor. Chronically self-preoccupied and hyperthymic style a little better.  Less pressured and less manic presently.     Lab Review:     Component Value Date/Time   NA 139 05/17/2021 1952   K 3.8 05/17/2021 1952   CL 105 05/17/2021 1952   CO2 23 05/17/2021 1952   GLUCOSE 88 05/17/2021 1952   BUN 12 05/17/2021 1952   CREATININE 1.10 05/17/2021 1952   CREATININE 1.24 06/18/2018 1502    CALCIUM 9.2 05/17/2021 1952   PROT 7.5 05/17/2021 1952   ALBUMIN 4.7 05/17/2021 1952   AST 54 (H) 05/17/2021 1952   ALT 45 (H) 05/17/2021 1952   ALKPHOS 65 05/17/2021 1952   BILITOT 0.3 05/17/2021 1952   GFRNONAA >60 05/17/2021 1952   GFRNONAA 84 04/02/2018 1551   GFRAA 97 04/02/2018 1551       Component Value Date/Time   WBC 7.9 05/17/2021 1952   RBC 4.60 05/17/2021 1952   HGB 13.6 05/17/2021 1952   HCT 40.8 05/17/2021 1952   PLT 243 05/17/2021  1952   MCV 88.7 05/17/2021 1952   MCH 29.6 05/17/2021 1952   MCHC 33.3 05/17/2021 1952   RDW 13.2 05/17/2021 1952   LYMPHSABS 1.9 06/27/2016 1441   MONOABS 0.8 06/27/2016 1441   EOSABS 0.2 06/27/2016 1441   BASOSABS 0.1 06/27/2016 1441    Lithium Lvl  Date Value Ref Range Status  09/09/2019 1.1 0.6 - 1.2 mmol/L Final    12/14/19 lithium level 0.6 (trough 13 hours) , normal BMP and calcium   No results found for: PHENYTOIN, PHENOBARB, VALPROATE, CBMZ   .res Assessment: Plan:    Cody Short was seen today for follow-up, manic behavior and medication reaction.  Diagnoses and all orders for this visit:  Bipolar I disorder, most recent episode (or current) manic (HCC) -     lurasidone (LATUDA) 80 MG TABS tablet; Take 1 tablet (80 mg total) by mouth daily with breakfast.  Generalized anxiety disorder -     lurasidone (LATUDA) 80 MG TABS tablet; Take 1 tablet (80 mg total) by mouth daily with breakfast.  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  Marijuana abuse in remission  Insomnia due to mental condition   Cody Fickleaul has had significant disruptive mood and psychotic symptoms and substance abuse over the course of his treatment here.  It has been evident in chronic occupational dysfunction and some relational problems.  He is also been very prone to EPS and elevated blood sugars from atypicals  making it difficult to get adequate mood stabilization.   Has a history of severe pancreatitis from Depakote.  He did not have an adequate response to carbamazepine.   lithium was insufficient to control his symptoms in monotherapy and he developed diabetes insipidus.  There are no available mood stabilizers that do not have a warning of elevating blood sugar and cholesterol.  He has tried all the mood stabilizers that do not have this warning.  All of the available mood stabilizers left that have any effectiveness are antipsychotics and the FDA has required a class warning on all antipsychotics about blood sugar and cholesterol effects.  This is true despite the fact that not all antipsychotics are equal and increasing these risks.  The patient's reading of these potential side effects is complicating finding an effective treatment.  The only way to prove whether or not these meds are elevating his blood sugar and cholesterol is to stop the medications for a period of time.  This exposes him to manic risk but there is no chance of finding an adequate mood stabilizer that will satisfy him until we can prove whether or not these meds are in fact affecting his blood sugar, cholesterol, blood pressure, and pulse.  In general he has not been able to afford branded medications which further limits options such as the use of Latuda which has a lower metabolic risk than other antipsychotics.  He is also EPS prone which further limits options.  Latuda  Been the best med so far for him.     Sleepiness was not better with reduction in Latuda from 120 to 60 mg daily. Continue Latuda 80 mg bc manic with less .  Option split dose to help sleepiness .  If recurrence of mania then increase it back up 120 mg PM.  Is getting benefit.. Better tolerated than others so far but he does say it  makes him hungry but he has chronic impulse control problems including overeating.  Disc Abilify depot or risperidone or another  depot .   Discussed potential metabolic side effects associated with atypical antipsychotics, as well as potential risk for movement side effects. Advised pt to contact office if movement side effects occur.   continue Hydroxyzine 10-20 mg QID prn to see if sleepiness is better than 25 mg.  This has been helpful for anxiety and itching and may have some mild antimanic effect.  Consider modafinil if necessary for function.  Disc risk of mania.  Disc SE.    He has a low stress tolerance.  He can be easily agitated in public. OK to schedule with Elio Forget for therapy  He has continued abstinence from marijuana is encouraged.  He has a history of cannabinoid hyperemesis which finally convinced him to discontinue the marijuana.  His mood disorder and thought disorganization have improved since being off the marijuana.  He has a very long history of heavy marijuana dependence.   Constipation management 1.  Lots of water 2.  Powdered fiber supplement such as MiraLAX, Citrucel, etc. preferably with a meal 3.  2 stool softeners a day 4.  Milk of magnesia or magnesium tablets if needed Try lower dose miralax.  Sleep hygiene. Disc poss OSA and how it affects alertness.  Disc elevated CK levels with Dr. Duanne Guess and they are trending positive or improving now.  Follow-up 12 weeks bc chronically unstable and typical phone calls between appts.    Meredith Staggers, MD, DFAPA  Please see After Visit Summary for patient specific instructions.  Future Appointments  Date Time Provider Department Center  12/17/2021  3:00 PM Waldron Session, [facility] None  01/09/2022  2:40 PM Carlus Pavlov, MD [facility] None  02/03/2022  3:00 PM Dohmeier, Porfirio Mylar, MD [facility] None  02/20/2022  3:00 PM Yates Decamp, MD [facility] None    No orders of the defined types were placed in this encounter.      -------------------------------

## 2021-12-11 NOTE — Telephone Encounter (Signed)
From patient.

## 2021-12-17 ENCOUNTER — Other Ambulatory Visit: Payer: Self-pay

## 2021-12-17 ENCOUNTER — Ambulatory Visit (INDEPENDENT_AMBULATORY_CARE_PROVIDER_SITE_OTHER): Payer: 59 | Admitting: Mental Health

## 2021-12-17 DIAGNOSIS — F3110 Bipolar disorder, current episode manic without psychotic features, unspecified: Secondary | ICD-10-CM | POA: Diagnosis not present
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
## 2022-01-02 ENCOUNTER — Encounter: Payer: Self-pay | Admitting: Cardiology

## 2022-01-02 ENCOUNTER — Encounter: Payer: Self-pay | Admitting: Internal Medicine

## 2022-01-02 DIAGNOSIS — G720 Drug-induced myopathy: Secondary | ICD-10-CM

## 2022-01-02 DIAGNOSIS — T466X5A Adverse effect of antihyperlipidemic and antiarteriosclerotic drugs, initial encounter: Secondary | ICD-10-CM

## 2022-01-03 ENCOUNTER — Other Ambulatory Visit: Payer: Self-pay | Admitting: Internal Medicine

## 2022-01-03 MED ORDER — EMPAGLIFLOZIN 10 MG PO TABS
10.0000 mg | ORAL_TABLET | Freq: Every day | ORAL | 5 refills | Status: DC
Start: 2022-01-03 — End: 2022-03-26

## 2022-01-03 NOTE — Telephone Encounter (Signed)
From pt

## 2022-01-06 ENCOUNTER — Encounter: Payer: Self-pay | Admitting: Internal Medicine

## 2022-01-06 DIAGNOSIS — E1165 Type 2 diabetes mellitus with hyperglycemia: Secondary | ICD-10-CM

## 2022-01-06 MED ORDER — BD PEN NEEDLE NANO U/F 32G X 4 MM MISC: 0 refills | Status: DC

## 2022-01-06 NOTE — Telephone Encounter (Signed)
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 K35.4S5K   ?  ? ?Orders Placed This Encounter  ?Procedures  ? CK  ?  ?

## 2022-01-06 NOTE — Telephone Encounter (Signed)
From pt

## 2022-01-09 ENCOUNTER — Ambulatory Visit: Payer: 59 | Admitting: Internal Medicine

## 2022-01-09 ENCOUNTER — Other Ambulatory Visit: Payer: Self-pay | Admitting: Psychiatry

## 2022-01-13 ENCOUNTER — Encounter: Payer: Self-pay | Admitting: Internal Medicine

## 2022-01-20 ENCOUNTER — Encounter: Payer: Self-pay | Admitting: Internal Medicine

## 2022-01-22 ENCOUNTER — Encounter: Payer: Self-pay | Admitting: Cardiology

## 2022-01-22 LAB — CK: Total CK: 267 U/L (ref 49–439)

## 2022-01-22 NOTE — Telephone Encounter (Signed)
From patient.

## 2022-01-23 ENCOUNTER — Ambulatory Visit (INDEPENDENT_AMBULATORY_CARE_PROVIDER_SITE_OTHER): Payer: 59 | Admitting: Mental Health

## 2022-01-23 DIAGNOSIS — F3110 Bipolar disorder, current episode manic without psychotic features, unspecified: Secondary | ICD-10-CM | POA: Diagnosis not present
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
## 2022-01-23 NOTE — Telephone Encounter (Signed)
From patient.

## 2022-01-25 ENCOUNTER — Other Ambulatory Visit: Payer: Self-pay | Admitting: Internal Medicine

## 2022-01-25 DIAGNOSIS — E1165 Type 2 diabetes mellitus with hyperglycemia: Secondary | ICD-10-CM

## 2022-01-31 ENCOUNTER — Encounter: Payer: Self-pay | Admitting: Cardiology

## 2022-02-03 ENCOUNTER — Ambulatory Visit: Payer: 59 | Admitting: Neurology

## 2022-02-03 ENCOUNTER — Encounter: Payer: Self-pay | Admitting: Neurology

## 2022-02-03 VITALS — BP 169/79 | HR 99 | Ht 69.0 in | Wt 230.0 lb

## 2022-02-03 DIAGNOSIS — E119 Type 2 diabetes mellitus without complications: Secondary | ICD-10-CM | POA: Insufficient documentation

## 2022-02-03 DIAGNOSIS — R5382 Chronic fatigue, unspecified: Secondary | ICD-10-CM

## 2022-02-03 DIAGNOSIS — E0969 Drug or chemical induced diabetes mellitus with other specified complication: Secondary | ICD-10-CM

## 2022-02-03 DIAGNOSIS — Z6833 Body mass index (BMI) 33.0-33.9, adult: Secondary | ICD-10-CM

## 2022-02-03 DIAGNOSIS — E661 Drug-induced obesity: Secondary | ICD-10-CM | POA: Diagnosis not present

## 2022-02-03 DIAGNOSIS — G478 Other sleep disorders: Secondary | ICD-10-CM

## 2022-02-03 DIAGNOSIS — R5381 Other malaise: Secondary | ICD-10-CM

## 2022-02-03 DIAGNOSIS — Z794 Long term (current) use of insulin: Secondary | ICD-10-CM

## 2022-02-03 DIAGNOSIS — G2581 Restless legs syndrome: Secondary | ICD-10-CM | POA: Diagnosis not present

## 2022-02-03 DIAGNOSIS — F3132 Bipolar disorder, current episode depressed, moderate: Secondary | ICD-10-CM

## 2022-02-03 DIAGNOSIS — R0683 Snoring: Secondary | ICD-10-CM

## 2022-02-03 DIAGNOSIS — F250 Schizoaffective disorder, bipolar type: Secondary | ICD-10-CM

## 2022-02-03 NOTE — Patient Instructions (Signed)
Restless Legs Syndrome ?Restless legs syndrome is a condition that causes uncomfortable feelings or sensations in the legs, especially while sitting or lying down. The sensations usually cause an overwhelming urge to move the legs. The arms can also sometimes be affected. ?The condition can range from mild to severe. The symptoms often interfere with a person's ability to sleep. ?What are the causes? ?The cause of this condition is not known. ?What increases the risk? ?The following factors may make you more likely to develop this condition: ?Being older than 50. ?Pregnancy. ?Being a woman. In general, the condition is more common in women than in men. ?A family history of the condition. ?Having iron deficiency. ?Overuse of caffeine, nicotine, or alcohol. ?Certain medical conditions, such as kidney disease, Parkinson's disease, or nerve damage. ?Certain medicines, such as those for high blood pressure, nausea, colds, allergies, depression, and some heart conditions. ?What are the signs or symptoms? ?The main symptom of this condition is uncomfortable sensations in the legs, such as: ?Pulling. ?Tingling. ?Prickling. ?Throbbing. ?Crawling. ?Burning. ?Usually, the sensations: ?Affect both sides of the body. ?Are worse when you sit or lie down. ?Are worse at night. These may make it difficult to fall asleep. ?Make you have a strong urge to move your legs. ?Are temporarily relieved by moving your legs or standing. ?The arms can also be affected, but this is rare. People who have this condition often have tiredness during the day because of their lack of sleep at night. ?How is this diagnosed? ?This condition may be diagnosed based on: ?Your symptoms. ?Blood tests. ?In some cases, you may be monitored in a sleep lab by a specialist (a sleep study). This can detect any disruptions in your sleep. ?How is this treated? ?This condition is treated by managing the symptoms. This may include: ?Lifestyle changes, such as  exercising, using relaxation techniques, and avoiding caffeine, alcohol, or tobacco. ?Iron supplements. ?Medicines. Parkinson's medications may be tried first. Anti-seizure medications can also be helpful. ?Follow these instructions at home: ?General instructions ?Take over-the-counter and prescription medicines only as told by your health care provider. ?Use methods to help relieve the uncomfortable sensations, such as: ?Massaging your legs. ?Walking or stretching. ?Taking a cold or hot bath. ?Keep all follow-up visits. This is important. ?Lifestyle ?  ?Practice good sleep habits. For example, go to bed and get up at the same time every day. Most adults should get 7-9 hours of sleep each night. ?Exercise regularly. Try to get at least 30 minutes of exercise most days of the week. ?Practice ways of relaxing, such as yoga or meditation. ?Avoid caffeine and alcohol. ?Do not use any products that contain nicotine or tobacco. These products include cigarettes, chewing tobacco, and vaping devices, such as e-cigarettes. If you need help quitting, ask your health care provider. ?Where to find more information ?National Institute of Neurological Disorders and Stroke: www.ninds.nih.gov ?Contact a health care provider if: ?Your symptoms get worse or they do not improve with treatment. ?Summary ?Restless legs syndrome is a condition that causes uncomfortable feelings or sensations in the legs, especially while sitting or lying down. ?The symptoms often interfere with your ability to sleep. ?This condition is treated by managing the symptoms. You may need to make lifestyle changes or take medicines. ?This information is not intended to replace advice given to you by your health care provider. Make sure you discuss any questions you have with your health care provider. ?Document Revised: 05/26/2021 Document Reviewed: 05/26/2021 ?Elsevier Patient Education ? 2022   Elsevier Inc[employer] ?Quality Sleep Information, Adult ?Quality sleep is  important for your mental and physical health. It also improves your quality of life. Quality sleep means you: ?Are asleep for most of the time you are in bed. ?Fall asleep within 30 minutes. ?Wake up no more than once a night.  ?Are awake for no longer than 20 minutes if you do wake up during the night. ?Most adults need 7-8 hours of quality sleep each night. ?How can poor sleep affect me? ?If you do not get enough quality sleep, you may have: ?Mood swings. ?Daytime sleepiness. ?Confusion. ?Decreased reaction time. ?Sleep disorders, such as insomnia and sleep apnea. ?Difficulty with: ?Solving problems. ?Coping with stress. ?Paying attention. ?These issues may affect your performance and productivity at work, school, and at home. Lack of sleep may also put you at higher risk for accidents, suicide, and risky behaviors. ?If you do not get quality sleep you may also be at higher risk for several health problems, including: ?Infections. ?Type 2 diabetes. ?Heart disease. ?High blood pressure. ?Obesity. ?Worsening of long-term conditions, like arthritis, kidney disease, depression, Parkinson's disease, and epilepsy. ?What actions can I take to get more quality sleep? ?  ?Stick to a sleep schedule. Go to sleep and wake up at about the same time each day. Do not try to sleep less on weekdays and make up for lost sleep on weekends. This does not work. ?Try to get about 30 minutes of exercise on most days. Do not exercise 2-3 hours before going to bed. ?Limit naps during the day to 30 minutes or less. ?Do not use any products that contain nicotine or tobacco, such as cigarettes or e-cigarettes. If you need help quitting, ask your health care provider. ?Do not drink caffeinated beverages for at least 8 hours before going to bed. Coffee, tea, and some sodas contain caffeine. ?Do not drink alcohol close to bedtime. ?Do not eat large meals close to bedtime. ?Do not take naps in the late afternoon. ?Try to get at least 30 minutes  of sunlight every day. Morning sunlight is best. ?Make time to relax before bed. Reading, listening to music, or taking a hot bath promotes quality sleep. ?Make your bedroom a place that promotes quality sleep. Keep your bedroom dark, quiet, and at a comfortable room temperature. Make sure your bed is comfortable. Take out sleep distractions like TV, a computer, smartphone, and bright lights. ?If you are lying awake in bed for longer than 20 minutes, get up and do a relaxing activity until you feel sleepy. ?Work with your health care provider to treat medical conditions that may affect sleeping, such as: ?Nasal obstruction. ?Snoring. ?Sleep apnea and other sleep disorders. ?Talk to your health care provider if you think any of your prescription medicines may cause you to have difficulty falling or staying asleep. ?If you have sleep problems, talk with a sleep consultant. If you think you have a sleep disorder, talk with your health care provider about getting evaluated by a specialist. ?Where to find more information ?[facility] website: https://sleepfoundation.org ?National Heart, Lung, and [place] ([place]): http://www.saunders.info/.pdf ?Centers for Disease Control and Prevention (CDC): LearningDermatology.pl ?Contact a health care provider if you: ?Have trouble getting to sleep or staying asleep. ?Often wake up very early in the morning and cannot get back to sleep. ?Have daytime sleepiness. ?Have daytime sleep attacks of suddenly falling asleep and sudden muscle weakness (narcolepsy). ?Have a tingling sensation in your legs with a strong urge to move  your legs (restless legs syndrome). ?Stop breathing briefly during sleep (sleep apnea). ?Think you have a sleep disorder or are taking a medicine that is affecting your quality of sleep. ?Summary ?Most adults need 7-8 hours of quality sleep each night. ?Getting enough quality sleep is an important part of  health and well-being. ?Make your bedroom a place that promotes quality sleep and avoid things that may cause you to have poor sleep, such as alcohol, caffeine, smoking, and large meals. ?Talk to your health car

## 2022-02-03 NOTE — Progress Notes (Signed)
? ? ?SLEEP MEDICINE CLINIC ?  ? ?Provider:  Melvyn Novas, MD  ?Primary Care Physician:  Lewis Moccasin, MD ?[address] ?[place] [address]  ? ?  ?Referring Provider: Lewis Moccasin, Md ?[address] ?[place],  [place] [address]  ?  ?  ?    ?Chief Complaint according to patient   ?Patient presents with:  ?  ? New Patient (Initial Visit)  ?     ?  ?  ?HISTORY OF PRESENT ILLNESS:  ? ?02-03-2022:  ?Cody Short is a 46 y.o. year old White or Caucasian male patient seen here as a referral on 02/03/2022 from [facility] office  for a sleep consultation .  ?Chief concern according to patient :  " always tired non restorative sleep-referred for snoring after weight gain on Latuda. Bipolar condition better  controlled on medication, weight gain of 40 pounds". I am so fatigued and sleepy all he time, I wake up after naps and feel more exhausted, insomnia is no longer a problem.  ?  ?I have the pleasure of seeing Cody Short on 02-03-2022, a right -handed White or Caucasian male with a possible sleep disorder.  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 ?  ?  ?Sleep relevant medical history: Nocturia only one time, can go back to sleep-,No TBI, no MVA, No ENT surgery.  ?  ? Family medical /sleep history: father  is oxygen dependent-  ? Mother has insomnia, no sleep walkers. nephew with night terrors, age 20 .  ?  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  ?Tobacco use: 15 years ago.   ?ETOH use none ,  ?Caffeine intake in form of Coffee( /) Soda( mountain dew 1-2 at work) Tea ( one cup breakfast ) or energy drinks. ?Regular  exercise in form of walking- too fatigued to do anything else. .   ? ?  ?  ?Sleep habits are as follows: The patient's dinner time is between  5-6 PM. The patient needs stimulation to not go to sleep- he goes to bed at 9-11 PM and continues to sleep for 8 hours, wakes for one bathroom breaks, the first time at 2-3 AM.   ?The preferred sleep position is side and prone-, with the support of 1 pillow. Flat bed-  ? Dreams are reportedly rare now- on medications. ?6 AM is the usual rise time. The patient wakes up spontaneously/ 6 AM with an alarm.  ?He reports not feeling refreshed or restored in AM, with symptoms such as dry mouth, stiffness, back pain, and residual fatigue.  ?Naps are taken frequently, " I fall asleep, can't help it "  ?lasting from 60-120  minutes and are ess refreshing than nocturnal sleep.  ?  ?Review of Systems: ?Out of a complete 14 system review, the patient complains of only the following symptoms, and all other reviewed systems are negative.:  ? ?Cody Short is a 46 year old gentleman who has had struggles with insomnia and daytime fatigue as well as sleepiness in the past related to an underlying mood disorder.  At this time now he is feeling that his anxiety and stressors have  been better controlled and his insomnia is no longer an issue he is on [place] now.  ?He has RLS now on latuda , possible that his tremor is also related to medication.  ?Leg and body cramping reported, he started on magnesium.  ? ?2)  He has taken this medication for several years now it has caused however weight gain and in response to this and elevation of blood pressure, loud snoring and possible sleep apnea.  He feels all day fatigued and not restored after sleep in the morning.  If he is not stimulated or physically active and at rest he can fall asleep easily.  He often plays video games to stay awake. ?3) fatigue is excessively high, too.  ?Fatigue, sleepiness , snoring, fragmented sleep, ?cyclic Insomnia ,  knee pain, ankle swelling. ?Weight gain, non restorative sleep, hypersomnia, and high fatigue.  ? ?  ?How likely are you to doze in the following situations: ?0 = not likely, 1 = slight chance, 2 = moderate chance, 3 = high chance ?  ?Sitting and Reading? ?Watching Television? ?Sitting inactive in a public place (theater or meeting)? ?As a passenger in a car for an hour without a break? ?Lying down in the afternoon when circumstances permit? ?Sitting and talking to someone? ?Sitting quietly after lunch without alcohol? ?In a car, while stopped for a few minutes in traffic? ?  ?Total = 4-12/ 24 points - mood related.  ? FSS endorsed at 60/ 63 points.  ? ?Social History  ? ?Socioeconomic History  ? Marital status: Single  ?  Spouse name: Not on file  ? Number of children: 0  ? Years of education: Not on file  ? Highest education level: Bachelor's degree (e.g., BA, AB, BS)  ?Occupational History  ? Not on file  ?Tobacco Use  ? Smoking status: Former  ?  Packs/day: 2.50  ?  Years: 10.00  ?  Pack years: 25.00  ?  Types: Cigarettes  ?  Quit date: 10/27/2006  ?  Years since quitting: 15.2  ? Smokeless tobacco: Never  ?Vaping Use  ? Vaping Use: Never used  ?Substance and Sexual Activity  ? Alcohol use: Not Currently  ?  Comment: none at all  ? Drug use: No  ? Sexual activity: Not on file  ?Other Topics Concern  ? Not on file  ?Social History Narrative  ? Single. Not dating currently. No kids.   ? Lives with mom and dad  ?   ? 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 Hobbies: video games PC  ? Caffeine: 2 C a day  ? ?Social Determinants of Health  ? ?Financial Resource Strain: Not on file  ?Food Insecurity: Not on file  ?Transportation Needs: Not on file  ?Physical Activity: Not on file  ?Stress: Not on file  ?Social Connections: Not on file  ? ? ?Family History  ?Problem Relation Age of Onset  ? Diabetes Mother   ? Breast cancer Mother   ?     was told not hereditary  ? Cirrhosis  Mother   ?     fatty liver  ? High blood pressure Mother   ? High blood pressure Father   ? Diabetes Father   ? Melanoma Maternal Uncle   ? Colon cancer Neg Hx   ? Stomach cancer Neg Hx   ? Pancreatitis Neg Hx   ? Heart disease Neg Hx   ? Kidney disease Neg  Hx   ? Liver disease Neg Hx   ? ? ?Past Medical History:  ?Diagnosis Date  ? 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 Anxiety   ? Bipolar affective disorder (HCC)   ? Depression   ? 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 High cholesterol   ? Hypertension   ? HYPERTRIGLYCERIDEMIA 10/14/2010  ? Nocturia 07/04/2010  ? PILAR CYST 08/03/2007  ? Cyst in groin-states comes up when blood sugar gets high. Recurs if cannot walk for a week.     ? TOBACCO ABUSE, HX OF 07/31/2009  ? ? ?Past Surgical History:  ?Procedure Laterality Date  ? pilar cystectomy    ?  ? ?Current Outpatient Medications on File Prior to Visit  ?Medication Sig Dispense Refill  ? Blood Glucose Monitoring Suppl (FREESTYLE LITE) DEVI Use as instructed to check sugar 4 times daily 1 each 0  ? empagliflozin (JARDIANCE) 10 MG TABS tablet Take 1 tablet (10 mg total) by mouth daily. 30 tablet 5  ? fenofibrate (TRICOR) 145 MG tablet Take 1 tablet (145 mg total) by mouth daily. 30 tablet 2  ? glucose blood (FREESTYLE LITE) test strip Use as instructed to check sugar 4 times daily 400 each 3  ? hydrOXYzine (ATARAX) 10 MG tablet TAKE 1 TABLET BY MOUTH THREE TIMES A DAY AS NEEDED 270 tablet 0  ? insulin aspart (NOVOLOG FLEXPEN) 100 UNIT/ML FlexPen Inject 35-45 Units into the skin in the morning, at noon, in the evening, and at bedtime. (Patient taking differently: Inject 10 Units into the skin 3 (three) times daily with meals.) 45 mL 11  ? insulin glargine-yfgn (SEMGLEE) 100 UNIT/ML Pen Inject 60 Units into the skin at bedtime. 45 mL 3  ? Insulin Pen Needle (BD PEN NEEDLE NANO U/F) 32G X 4 MM MISC USE 4 TIMES DAILY WITH INSULIN PENS 400 each 0  ? lurasidone (LATUDA) 80 MG TABS tablet Take 1 tablet (80 mg total) by mouth  daily with breakfast. 90 tablet 0  ? metFORMIN (GLUCOPHAGE-XR) 500 MG 24 hr tablet TAKE 4 TABLETS (2,000 MG TOTAL) BY MOUTH DAILY WITH SUPPER. 360 tablet 0  ? OneTouch Delica Lancets 30G MISC 1 each by Do

## 2022-02-03 NOTE — Telephone Encounter (Signed)
From patient.

## 2022-02-03 NOTE — Telephone Encounter (Signed)
From pt

## 2022-02-07 ENCOUNTER — Encounter: Payer: Self-pay | Admitting: Cardiology

## 2022-02-09 ENCOUNTER — Encounter: Payer: Self-pay | Admitting: Cardiology

## 2022-02-10 ENCOUNTER — Other Ambulatory Visit: Payer: Self-pay

## 2022-02-10 DIAGNOSIS — E7800 Pure hypercholesterolemia, unspecified: Secondary | ICD-10-CM

## 2022-02-11 LAB — LIPID PANEL WITH LDL/HDL RATIO
Cholesterol, Total: 225 mg/dL — ABNORMAL HIGH (ref 100–199)
HDL: 37 mg/dL — ABNORMAL LOW (ref >39)
LDL Chol Calc (NIH): 145 mg/dL — ABNORMAL HIGH (ref 0–99)
LDL/HDL Ratio: 3.9 ratio — ABNORMAL HIGH (ref 0.0–3.6)
Triglycerides: 237 mg/dL — ABNORMAL HIGH (ref 0–149)
VLDL Cholesterol Cal: 43 mg/dL — ABNORMAL HIGH (ref 5–40)

## 2022-02-18 ENCOUNTER — Encounter: Payer: Self-pay | Admitting: Neurology

## 2022-02-20 ENCOUNTER — Other Ambulatory Visit: Payer: Self-pay | Admitting: Cardiology

## 2022-02-20 ENCOUNTER — Ambulatory Visit: Payer: 59 | Admitting: Cardiology

## 2022-02-20 ENCOUNTER — Encounter: Payer: Self-pay | Admitting: Cardiology

## 2022-02-20 ENCOUNTER — Ambulatory Visit (INDEPENDENT_AMBULATORY_CARE_PROVIDER_SITE_OTHER): Payer: 59 | Admitting: Mental Health

## 2022-02-20 VITALS — BP 126/76 | HR 79 | Temp 98.5°F | Resp 17 | Ht 69.0 in | Wt 234.8 lb

## 2022-02-20 DIAGNOSIS — E7800 Pure hypercholesterolemia, unspecified: Secondary | ICD-10-CM

## 2022-02-20 DIAGNOSIS — F3110 Bipolar disorder, current episode manic without psychotic features, unspecified: Secondary | ICD-10-CM | POA: Diagnosis not present

## 2022-02-20 DIAGNOSIS — G720 Drug-induced myopathy: Secondary | ICD-10-CM

## 2022-02-20 DIAGNOSIS — R748 Abnormal levels of other serum enzymes: Secondary | ICD-10-CM

## 2022-02-20 DIAGNOSIS — I10 Essential (primary) hypertension: Secondary | ICD-10-CM

## 2022-02-20 MED ORDER — EZETIMIBE 10 MG PO TABS: 10.0000 mg | ORAL_TABLET | Freq: Every day | ORAL | 2 refills | Status: DC

## 2022-02-20 MED ORDER — PRALUENT 150 MG/ML ~~LOC~~ SOAJ: 1.0000 mL | SUBCUTANEOUS | 3 refills | Status: DC
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
## 2022-02-20 NOTE — Telephone Encounter (Signed)
Called patient to schedule his sleep study. However, pt states he will call back after he gets off of work. ?

## 2022-02-20 NOTE — Progress Notes (Signed)
? ?Primary Physician/Referring:  Fanny Bien, MD ? ?Patient ID: Cody Short, male    DOB: [birth year], 46 y.o.   MRN: [id number] ? ?Chief Complaint  ?Patient presents with  ? hld  ? Hypertension  ?  4 month  ? ?HPI:   ? ?Cody Short  is a 46 y.o. Caucasian male patient with bipolar disorder, hypertension, mixed hyperlipidemia, type 2 diabetes in young which is uncontrolled, obesity, statin intolerance and was on Zetia and fenofibric acid but developed myalgias and elevated CK in July 2022 and fenofibric acid was discontinued. He quit smoking cigarettes in 2010, has now been concerned about cardiac issues and he is trying to lose weight and change his diet. ? ?Due to elevated CK and myalgias patient has been unable to tolerate Zetia, fenofibric acid, statin therapy.  Attempted to start patient on Repatha, however despite appeals this was not covered by insurance.  Patient's PCP has been monitoring CK levels, most recently total CK at 338 on 11/13/2021.  Patient's myalgias have improved since stopping Zetia, however he does continue to have mild myalgias. ? ?Patient has had no recurrence of chest pain.  He states he is working to make diet and lifestyle modifications.  Unfortunately no change in weight, in fact has gained 4 Lbs. ?Past Medical History:  ?Diagnosis Date  ? 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 Anxiety   ? Bipolar affective disorder ([place])   ? Depression   ? DM w/o Complication Type II [id number]  ? High cholesterol   ? Hypertension   ? HYPERTRIGLYCERIDEMIA 10/14/2010  ? Nocturia 07/04/2010  ? PILAR CYST 08/03/2007  ? Cyst in groin-states comes up when blood sugar gets high. Recurs if cannot walk for a week.     ? TOBACCO ABUSE, HX OF 07/31/2009  ? ?  ?Social History  ? ?Tobacco Use  ? Smoking status: Former  ?  Packs/day: 2.50  ?  Years: 10.00  ?  Pack years: 25.00  ?  Types: Cigarettes  ?  Quit date: 10/27/2006  ?  Years since quitting: 15.3  ? Smokeless tobacco: Never  ?Substance  Use Topics  ? Alcohol use: Not Currently  ?  Comment: none at all  ? ?Marital Status: Single  ?ROS  ?Review of Systems  ?Constitutional: Positive for weight loss.  ?Cardiovascular:  Negative for chest pain, dyspnea on exertion and leg swelling.  ?Gastrointestinal:  Negative for melena.  ?Psychiatric/Behavioral:  Positive for depression. The patient is nervous/anxious.   ?Objective  ?Blood pressure 126/76, pulse 79, temperature 98.5 ?F (36.9 ?C), temperature source Temporal, resp. rate 17, height 5\' 9"  (1.753 m), weight 234 lb 12.8 oz (106.5 kg), SpO2 99 %. Body mass index is 34.67 kg/m?.  ? ?  02/20/2022  ?  2:51 PM 02/03/2022  ?  3:31 PM 11/25/2021  ?  3:43 PM  ?Vitals with BMI  ?Height 5\' 9"  5\' 9"    ?Weight 234 lbs 13 oz 230 lbs   ?BMI 34.66 33.95   ?Systolic 123XX123 [id number]  ?Diastolic 76 79 98  ?Pulse 79 99   ?  ?Physical Exam ?Constitutional:   ?   Appearance: He is obese.  ?Neck:  ?   Vascular: No carotid bruit or JVD.  ?Cardiovascular:  ?   Rate and Rhythm: Normal rate and regular rhythm.  ?   Pulses: Intact distal pulses.  ?   Heart sounds: Normal heart sounds. No murmur heard. ?  No gallop.  ?Pulmonary:  ?   Effort:  Pulmonary effort is normal.  ?   Breath sounds: Normal breath sounds.  ?Abdominal:  ?   General: Bowel sounds are normal.  ?   Palpations: Abdomen is soft.  ?Musculoskeletal:     ?   General: No swelling.  ?Physical exam unchanged compared to previous office visit. ? ?Laboratory examination:  ? ?Recent Labs  ?  05/17/21 ?1952  ?NA 139  ?K 3.8  ?CL 105  ?CO2 23  ?GLUCOSE 88  ?BUN 12  ?CREATININE 1.10  ?CALCIUM 9.2  ?GFRNONAA >60  ? ?CrCl cannot be calculated (Patient's most recent lab result is older than the maximum 21 days allowed.).  ? ?  Latest Ref Rng & Units 05/17/2021  ?  7:52 PM 06/18/2018  ?  3:02 PM 04/02/2018  ?  3:51 PM  ?CMP  ?Glucose 70 - 99 mg/dL 88   129   166    ?BUN 6 - 20 mg/dL 12   14   12     ?Creatinine 0.61 - 1.24 mg/dL 1.10   1.24   1.10    ?Sodium 135 - 145 mmol/L 139   140   137     ?Potassium 3.5 - 5.1 mmol/L 3.8   4.5   4.4    ?Chloride 98 - 111 mmol/L 105   107   108    ?CO2 22 - 32 mmol/L 23   19   20     ?Calcium 8.9 - 10.3 mg/dL 9.2   10.3   10.2    ?Total Protein 6.5 - 8.1 g/dL 7.5   8.3   7.9    ?Total Bilirubin 0.3 - 1.2 mg/dL 0.3   0.4   0.4    ?Alkaline Phos 38 - 126 U/L 65      ?AST 15 - 41 U/L 54   26   35    ?ALT 0 - 44 U/L 45   24   53    ? ? ?  Latest Ref Rng & Units 05/17/2021  ?  7:52 PM 06/27/2016  ?  2:41 PM 12/17/2015  ? 10:14 AM  ?CBC  ?WBC 4.0 - 10.5 K/uL 7.9   11.3   7.2    ?Hemoglobin 13.0 - 17.0 g/dL 13.6   13.5   12.9    ?Hematocrit 39.0 - 52.0 % 40.8   39.0   38.6    ?Platelets 150 - 400 K/uL 243   248.0   231.0    ? ? ?Lipid Panel ?Recent Labs  ?  07/31/21 ?[id number] 02/10/22 ?1008  ?CHOL 232* 225*  ?TRIG 319* 237*  ?LDLCALC 136* 145*  ?HDL 38* 37*  ? ?Lipid Panel  ?   ?Component Value Date/Time  ? CHOL 225 (H) 02/10/2022 1008  ? TRIG 237 (H) 02/10/2022 1008  ? HDL 37 (L) 02/10/2022 1008  ? CHOLHDL 4.6 08/06/2020 1017  ? CHOLHDL 6 04/02/2018 1551  ? VLDL 68.2 (H) 04/02/2018 1551  ? LDLCALC 145 (H) 02/10/2022 1008  ? LDLDIRECT 120.0 04/02/2018 1551  ? LABVLDL 43 (H) 02/10/2022 1008  ?  ? ?HEMOGLOBIN A1C ?Lab Results  ?Component Value Date  ? HGBA1C 9.1 (A) 10/10/2021  ? ?TSH ?Recent Labs  ?  10/10/21 ?1625  ?TSH 1.11  ?01/18/2021: Free T4 1.13, normal and free T3 3.5, normal. ? ?Component Ref Range & Units 05/17/2021  ?Total CK 49 - 397 U/L 562 High    ?  ? ?External labs:  ?11/13/2021: ?Total CK 338 (normal) ? ?Labs 03/14/2021: ? ?  Total cholesterol: 226, triglycerides: 41, LDL 128.  Non-HDL cholesterol 185. ? ?Total CK3 124. ? ?Labs 05/01/2021: Total CK 429.   ?CPK 925 on 04/26/2021. ? ?Labs 12/22/2018: ? ?Total cholesterol 220, triglycerides 359, HDL 41, LDL 145. ? ?Allergies  ? ?Allergies  ?Allergen Reactions  ? Tricor [Fenofibrate] Other (See Comments)  ?  Myopathy  ? Divalproex Sodium Other (See Comments)  ?  unknown  ? Methylphenidate Hcl   ?  Aggravate bipolar  ? Oxycodone Hcl    ?  REACTION: hallucinations  ? Ozempic (0.25 Or 0.5 Mg-Dose) [Semaglutide(0.25 Or 0.5mg -Dos)] Nausea Only  ? Paroxetine   ?  Aggravate bipolar disorder  ? Rosuvastatin Other (See Comments)  ?  Myopathy -muscle cramps, elevated CK  ?  ?Medication prior to this encounter:  ? ?Medication list after today's encounter  ? ? ?Current Outpatient Medications:  ?  Adapalene-Benzoyl Peroxide 0.1-2.5 % gel, SMARTSIG:1 Application Topical Every Evening, Disp: , Rfl:  ?  Blood Glucose Monitoring Suppl (FREESTYLE LITE) DEVI, Use as instructed to check sugar 4 times daily, Disp: 1 each, Rfl: 0 ?  empagliflozin (JARDIANCE) 10 MG TABS tablet, Take 1 tablet (10 mg total) by mouth daily., Disp: 30 tablet, Rfl: 5 ?  Evolocumab (REPATHA SURECLICK) [id number] MG/ML SOAJ, Inject 1 pen. into the skin every 14 (fourteen) days., Disp: 6 mL, Rfl: 3 ?  ezetimibe (ZETIA) 10 MG tablet, Take 1 tablet (10 mg total) by mouth daily after supper., Disp: 30 tablet, Rfl: 2 ?  famotidine (PEPCID) 40 MG tablet, Take 40 mg by mouth 2 (two) times daily., Disp: , Rfl:  ?  fluticasone (FLONASE) 50 MCG/ACT nasal spray, Place 2 sprays into both nostrils daily., Disp: , Rfl:  ?  glucose blood (FREESTYLE LITE) test strip, Use as instructed to check sugar 4 times daily, Disp: 400 each, Rfl: 3 ?  hydrOXYzine (ATARAX) 10 MG tablet, TAKE 1 TABLET BY MOUTH THREE TIMES A DAY AS NEEDED, Disp: 270 tablet, Rfl: 0 ?  insulin aspart (NOVOLOG FLEXPEN) 100 UNIT/ML FlexPen, Inject 35-45 Units into the skin in the morning, at noon, in the evening, and at bedtime. (Patient taking differently: Inject 10 Units into the skin 3 (three) times daily with meals.), Disp: 45 mL, Rfl: 11 ?  insulin glargine-yfgn (SEMGLEE) 100 UNIT/ML Pen, Inject 60 Units into the skin at bedtime., Disp: 45 mL, Rfl: 3 ?  Insulin Pen Needle (BD PEN NEEDLE NANO U/F) 32G X 4 MM MISC, USE 4 TIMES DAILY WITH INSULIN PENS, Disp: 400 each, Rfl: 0 ?  lurasidone (LATUDA) 80 MG TABS tablet, Take 1 tablet (80 mg total) by  mouth daily with breakfast., Disp: 90 tablet, Rfl: 0 ?  metFORMIN (GLUCOPHAGE-XR) 500 MG 24 hr tablet, TAKE 4 TABLETS (2,000 MG TOTAL) BY MOUTH DAILY WITH SUPPER., Disp: 360 tablet, Rfl: 0 ?  OneTouch

## 2022-02-21 NOTE — Telephone Encounter (Signed)
Pharmacy is requesting alternative

## 2022-02-22 ENCOUNTER — Encounter: Payer: Self-pay | Admitting: Cardiology

## 2022-02-22 MED ORDER — REPATHA SURECLICK 140 MG/ML ~~LOC~~ SOAJ: 1.0000 "pen " | SUBCUTANEOUS | 3 refills | Status: DC

## 2022-02-22 NOTE — Telephone Encounter (Signed)
Ask patient to call his insurance and ask if Repatha or Praluent is covered

## 2022-02-28 ENCOUNTER — Encounter: Payer: Self-pay | Admitting: Neurology

## 2022-03-03 ENCOUNTER — Encounter: Payer: Self-pay | Admitting: Cardiology

## 2022-03-03 NOTE — Telephone Encounter (Signed)
From patient.

## 2022-03-06 ENCOUNTER — Ambulatory Visit: Payer: 59 | Admitting: Internal Medicine

## 2022-03-06 ENCOUNTER — Encounter: Payer: Self-pay | Admitting: Internal Medicine

## 2022-03-06 VITALS — BP 114/70 | HR 79 | Ht 69.0 in | Wt 231.0 lb

## 2022-03-06 DIAGNOSIS — E669 Obesity, unspecified: Secondary | ICD-10-CM

## 2022-03-06 DIAGNOSIS — E782 Mixed hyperlipidemia: Secondary | ICD-10-CM

## 2022-03-06 DIAGNOSIS — E061 Subacute thyroiditis: Secondary | ICD-10-CM | POA: Diagnosis not present

## 2022-03-06 DIAGNOSIS — E1165 Type 2 diabetes mellitus with hyperglycemia: Secondary | ICD-10-CM | POA: Diagnosis not present

## 2022-03-06 LAB — POCT GLYCOSYLATED HEMOGLOBIN (HGB A1C): Hemoglobin A1C: 7.4 % — AB (ref 4.0–5.6)

## 2022-03-06 NOTE — Progress Notes (Addendum)
Patient ID: Cody Short, male   DOB: 28-Nov-1975, 46 y.o.   MRN: [id number] ? ?This visit occurred during the SARS-CoV-2 public health emergency.  Safety protocols were in place, including screening questions prior to the visit, additional usage of staff PPE, and extensive cleaning of exam room while observing appropriate contact time as indicated for disinfecting solutions.  ? ?HPI: ?Cody Short is a 46 y.o.-year-old male, presenting for follow-up for DM2, dx in ~2010, insulin-dependent, uncontrolled, without long term complications. Last visit 5 months ago. ? ?Interim history: ?No increased urination, blurry vision, nausea, chest pain. ?He continues JordanLatuda.  He had to stop Topamax in the past due to nausea. ?He had problems with muscle cramps/abdominal pain and lipase was found to be elevated again, at 67 (11-51) in 04/2021.  At that time, he also had transaminitis and an increase CK at 562 (49-347).   ?No cramps in last 3 weeks. ?He still has nausea  - on Pepcid >> helping. ?He has constipation - on Colace >> helping well. 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 ?DM2: ?Patient's diabetes is directly correlated with his bipolar disease treatment: ?In the past he had frequent urination resolved after stopping lithium.  While transitioning off lithium, his Saphris dose was increased the blood sugars started to increase afterwards.   ? ?He was then taken off Saphris and started on Loxitane.  This was making him sleepier, but was not increasing his blood sugars.  In fact, sugars started to improve and he was able to reduce his insulin doses.  ? ?She had to stop Loxitane 2/2 increased BP and pulse. ? ?In summer 2021, he switched to Caplyta >> sugars improved significantly on this.  In fact, he tells me that he was able to come off insulin completely while on this. ? ?However, this did not work for him -was having anger outbursts on it.  ? ?He started on Latuda >> feeling better on  this. However, on this, he was eating more >> sugars increased.  Also, he gained weight.  ? ?He was on an appetite suppressant (Topamax). This was causing nausea >> stopped. ? ?Reviewed HbA1c levels: ?Lab Results  ?Component Value Date  ? HGBA1C 9.1 (A) 10/10/2021  ? HGBA1C 7.4 (A) 05/14/2021  ? HGBA1C 7.3 (A) 01/18/2021  ?05/07/2021: HbA1c 9.3% ?12/22/2018: HbA1c 7.2% ? ?Pt is on a regimen of: ?- Metformin ER 2000 mg at dinnertime ?- Jardiance 10 mg daily in am ?- Novolog before meals: 0 >> 15-30 units before L and D >> 28-35 units 2-3x >> 35-45 >> 5-10 (15) units 3x a day before meals ?- Lantus 15 >> 0 >> 25 >> 40 >> 60-75 >> 60 units at bedtime ?He could not tolerate Ozempic >> nausea, AP ?We had to stop Jardiance due to increased urination. ?He was previously on Lantus but stopped since sugars improved. ?He tried Glipizide >> hypoglycemia in the 40s repeatedly. Lowest: 25. Also, Glipizide 2.5 mg in am >> nausea, vomiting, constipation ?We tried Invokana >> bothersome urination (when he was working) >> had to stop. ?He had pancreatitis 2/2 Depakote and HTG in the past. ?He stopped Actos b/c stomach pain. >>  ?We stopped Cycloset >> inefficient, could not use b/c price. ? ?Pt checks his sugars 4 times a day per review of his log: ?- am:    114-175 >> 94-149, 192 >> 95, 110-197 >> 131-254 >> 119-208, 268 >> 10 4-178, 181 ?- 2h after b'fast: 247 >>  n/c >> 100-166, 244 >> 101 >> 162, 173, 325 >> n/c  ?- lunch:  n/c >> 219 >> 116, 166 >> 161, 253, 264 >> 172-259 >> 175-260, 343 >> 67, 85-120 ?- 2h after lunch:  71-132, 171 >> 140-262, 310 >> 110-202, 290 >> 143, 144 >> n/c ?- before dinner:  102-188 >> 124-237 >> 114-337 >> 97, 106-291, 330 >> 64-176, 196 ?- 2h after dinner:  133--269 >> 87-134 >> 106-236 >> n/c >> 140-278, 340, 354 >> 76, 80, 160-201, 267 ?- bedtime:  119-207 >> 80-160, 190 >> 107-180 >> n/c ?- nighttime: 89-181 >> see above >> 134 >> 83, 95 >> n/c ?Lowest sugar was 50s >> 75 >> 71 >> 69 >> 90  >>  71; he has hypoglycemia awareness in the 70s. ?Highest sugar was 399 >> 468 >> 244 >> 310 >> 337 >> 354. ? ?Glucometer: [employer] (he likes this) ? ?No history of CKD, last BUN/creatinine:  ?Lab Results  ?Component Value Date  ? BUN 12 05/17/2021  ? CREATININE 1.10 05/17/2021  ? ?He had MAU in the past, improved at last check: ?Lab Results  ?Component Value Date  ? MICRALBCREAT 35.2 (H) 05/10/2020  ? MICRALBCREAT 26.7 04/02/2018  ? MICRALBCREAT 3.7 02/10/2017  ? MICRALBCREAT 27.3 12/17/2015  ? MICRALBCREAT 4.0 02/22/2015  ?12/22/2018: 262 ?On Diovan. ? ?He has mixed hyperlipidemia: ?Lab Results  ?Component Value Date  ? CHOL 225 (H) 02/10/2022  ? HDL 37 (L) 02/10/2022  ? LDLCALC 145 (H) 02/10/2022  ? LDLDIRECT 120.0 04/02/2018  ? TRIG 237 (H) 02/10/2022  ? CHOLHDL 4.6 08/06/2020  ?05/03/2020: 225/345/38/126 ?11/23/2019: 109/148/34/49 ?12/22/2018: 220/359/41/145 ?On fenofibrate 145.  He tried Repatha. ?Zetia, Rosuvastatin caused leg cramps, reportedly elevated CK (900s).  Most recent CK was 562. ? ?- last eye exam was in 03/2017: No DR ? ?-+ Numbness but no tingling in his feet ? ?He developed chest tightness 05/2021 and saw Dr. Jacinto Halim.  He had CAC score of 0 (07/11/2021). ?He has a history of scrotal rash-improved on Nystatin + Triamcinolone.  He may need refills in the future. He has recurrent groin furunculosis. ?In 2010, he developed pancreatitis from Depakote. ? ?Subclinical thyrotoxicosis: ?TSH levels were reviewed: ? ?Lab Results  ?Component Value Date  ? TSH 1.11 10/10/2021  ? TSH <0.01 (L) 01/18/2021  ? TSH <0.01 (L) 12/11/2020  ? TSH 0.02 (L) 09/14/2020  ? TSH 4.03 12/17/2015  ? TSH 2.08 01/03/2014  ? TSH 5.15 11/20/2011  ? TSH 2.15 07/04/2010  ? TSH 3.82 12/12/2009  ? TSH 1.81 10/29/2007  ? FREET4 0.76 10/10/2021  ? FREET4 1.13 01/18/2021  ? FREET4 1.56 12/11/2020  ? FREET4 1.41 09/14/2020  ? T3FREE 3.7 10/10/2021  ? T3FREE 3.5 01/18/2021  ? T3FREE 4.1 12/11/2020  ? T3FREE 3.9 09/14/2020  ?05/07/2021: TSH  1.21 ?08/06/2020: TSH 0.079 ?05/03/2020: TSH 0.528 ?12/22/2018: 3.648 ? ?We checked a thyroid uptake and scan (01/02/2021) and this was consistent with thyroiditis: ?The thyroid scan is unremarkable. No hot or cold thyroid nodules are identified. ?4 hour I-123 uptake = 2.7% (normal 5-20%) ?24 hour I-123 uptake = 5.4% (normal 10-30%) ?  ?IMPRESSION: ?Low 4 hour and 24 hour or I 123 uptake may suggest thyroiditis. ?  ?Pt denies: ?- feeling nodules in neck ?- hoarseness ?- dysphagia ?- choking ?- SOB with lying down ? ?ROS:+ see HPI ? ?I reviewed pt's medications, allergies, PMH, social hx, family hx, and changes were documented in the history of present illness. Otherwise, unchanged from  my initial visit note. ? ?Past Medical History:  ?Diagnosis Date  ? 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 Anxiety   ? Bipolar affective disorder ([facility])   ? Depression   ? 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 High cholesterol   ? Hypertension   ? HYPERTRIGLYCERIDEMIA 10/14/2010  ? Nocturia 07/04/2010  ? PILAR CYST 08/03/2007  ? Cyst in groin-states comes up when blood sugar gets high. Recurs if cannot walk for a week.     ? TOBACCO ABUSE, HX OF 07/31/2009  ? ?Past Surgical History:  ?Procedure Laterality Date  ? pilar cystectomy    ? ?History  ? ?Social History  ? Marital Status: Single  ?  Spouse Name: N/A  ? Number of Children: 0  ? ?Occupational History  ?   ? ?Social History Main Topics  ? Smoking status: Former Smoker - quit in 2010  ? Smokeless tobacco: Never Used  ? Alcohol Use: No  ?   Comment: none at all  ? Drug Use: No  ? ?Current Outpatient Medications  ?Medication Sig Dispense Refill  ? Adapalene-Benzoyl Peroxide 0.1-2.5 % gel SMARTSIG:1 Application Topical Every Evening    ? Blood Glucose Monitoring Suppl (FREESTYLE LITE) DEVI Use as instructed to check sugar 4 times daily 1 each 0  ? empagliflozin (JARDIANCE) 10 MG TABS tablet Take 1 tablet (10 mg total) by mouth daily. 30 tablet 5  ? Evolocumab (REPATHA SURECLICK) 140 MG/ML  SOAJ Inject 1 pen. into the skin every 14 (fourteen) days. 6 mL 3  ? ezetimibe (ZETIA) 10 MG tablet Take 1 tablet (10 mg total) by mouth daily after supper. 30 tablet 2  ? famotidine (PEPCID) 40 MG ta

## 2022-03-06 NOTE — Patient Instructions (Addendum)
Please continue: ?- Metformin ER 2000 mg with dinner ?- Jardiance 10 mg before breakfast ?- Novolog before meals: 5-15 units 4x a day ?- Lantus 60 units at bedtime ? ?Please return in 4 months with your sugar log. ?

## 2022-03-12 ENCOUNTER — Telehealth (INDEPENDENT_AMBULATORY_CARE_PROVIDER_SITE_OTHER): Payer: 59 | Admitting: Psychiatry

## 2022-03-12 ENCOUNTER — Encounter: Payer: Self-pay | Admitting: Psychiatry

## 2022-03-12 DIAGNOSIS — F1211 Cannabis abuse, in remission: Secondary | ICD-10-CM

## 2022-03-12 DIAGNOSIS — F3110 Bipolar disorder, current episode manic without psychotic features, unspecified: Secondary | ICD-10-CM

## 2022-03-12 DIAGNOSIS — L299 Pruritus, unspecified: Secondary | ICD-10-CM

## 2022-03-12 DIAGNOSIS — F411 Generalized anxiety disorder: Secondary | ICD-10-CM

## 2022-03-12 DIAGNOSIS — F5113 Hypersomnia due to other mental disorder: Secondary | ICD-10-CM

## 2022-03-12 DIAGNOSIS — F4001 Agoraphobia with panic disorder: Secondary | ICD-10-CM

## 2022-03-12 DIAGNOSIS — F5105 Insomnia due to other mental disorder: Secondary | ICD-10-CM

## 2022-03-12 MED ORDER — LURASIDONE HCL 80 MG PO TABS: 80.0000 mg | ORAL_TABLET | Freq: Every day | ORAL | 1 refills | Status: DC

## 2022-03-12 NOTE — Progress Notes (Signed)
Cody Short ?[id number] ?10-19-1976 ?46 y.o.  ? ?Virtual Visit via Telephone Note ? ?I connected with pt by telephone and verified that I am speaking with the correct person using two identifiers. ?  ?I discussed the limitations, risks, security and privacy concerns of performing an evaluation and management service by telephone and the availability of in person appointments. I also discussed with the patient that there may be a patient responsible charge related to this service. The patient expressed understanding and agreed to proceed. ? ?I discussed the assessment and treatment plan with the patient. The patient was provided an opportunity to ask questions and all were answered. The patient agreed with the plan and demonstrated an understanding of the instructions. ?  ?The patient was advised to call back or seek an in-person evaluation if the symptoms worsen or if the condition fails to improve as anticipated. ? ?I provided 30 minutes of non-face-to-face time during this encounter. The patient was located at home and the provider was located office.  ?Session from  330 until 400 ? ? ?Subjective:  ? ?Patient ID:  Cody Short is a 46 y.o. (DOB [birth year]) male. ? ?Chief Complaint:  ?Chief Complaint  ?Patient presents with  ? Follow-up  ? Manic Behavior  ? Medication Reaction  ? ? ? ?Cody Short presents to the office today for follow-up of several psychiatric diagnoses ...   ? ?seen August 2020.  For obsessive anxiety  Switched Lexapro 5 mg daily on May 18.  But he stated it made him hungry and so it was stopped in August.  He continued on Saphris 10 mg nightly, lithium 1200 mg daily, propranolol as needed tremor.  Lithium level was ordered. ? ?He called Sept saying he was manic.  Had a lithium level 0.7 and so dose was increased to 1500 mg daily.   ? ?seen December 21, 2019.  He had some ongoing manic symptoms and Saphris was increased to 15 mg nightly. ? ?02/07/2020 appointment  with the following noted: ?He has made multiple phone calls to the office since that date.  He decided he wanted to stop lithium because of urinary problems.  He was warned this could lead to mania but he wanted to pursue it anyway.  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  Cody Short had been having bedwetting episodes which she attributed to the lithium.  He has not consistently taken Saphris 15 mg at bedtime since the office visit in February. ?He completely stopped lithium January 30, 2020 ?U frequency is much better and less thirsty.  Bedwetting resolved.  Reduced BM to 1-2 times daily.  Gassy.  Glucose is high.  B is hospitalized with unknown disease.  Insulin adjusted. ?Mental status is best it's ever been.  Less mania.  Less obsessing.  No worsening mania. ?Is taking Saphris 15 mg HS> ?Plan no med changes. ? ?04/10/2020 appointment with the following noted: ?No longer on lithium.  Still frequent urination and thirst DT DM poorly controlled despite meds.  Endo says little else to take bc history of pancreatitis.  Says he goes to urinate q 15 mins. ?Plan: Wean Saphris in crossover to loxapine 150 mg HS over 2 weeks bc he and endocrinologist believe it's likely Saphris is cause of the inability to get his glucose to acceptable levels.  ? ?06/07/20 appt with the following noted: ?He's called a couple of times CO sleepiness and reduced loxapine to 50 mg HS. ?  Started Vegan diet and BS is better 4 weeks ago.  Thinks glucose is better off the Saphris. Lost 6 #.   ?CO sleepiness and sleep 16 hours   He thinks it also drove up pulse and blood pressure. ?Asks about taking Saphris 10 and a second medication. ?Mood has been fine.   ?Plan: He wants to restart Saphris 10 mg HS and use a 2nd mood stabilizer. ?Start Saphris and reduce Loxapine  To 10 mg at night DT prior failure of 10 Mg Saphris with lithium. ? ?06/27/2020 phone call from patient: ?Pt called to report Loxapine &  Saphris is causing his BP to be very high and Saphris is causing blood sugar to be high. Apt 10/18. Need changes before then. Call back @ [phone]  ?MD response: He had wanted to go back on Saphris and reduce loxapine which we did.  Tell him to stop the Saphris and increase loxapine to 20 mg nightly.  He did not tolerate 50 mg of loxapine very well. ?Nurse TC withpt: Spoke with Cody Short and he will stop the Saphris, but feels the increase in the Loxapine to 20 mg will increase his blood sugars more. Explained that with coming off the Saphris he needs to increase it a bit or he will not be stable with his moods. Advised him to stop the Saphris and would check with Cody Short about any other recommendations.  ? ?07/17/20 appt with the following noted: ?Doing terrible.  Needs melatonin to sleep.  Claims loxapine is elevating BP, pulse, TG, cholesterol, glucose.  Claimed Saphris did it too.   ?Sees PCP at end of October.   ?Started Vegan diet about 6 weeks ago and exercising.   ?A/P: There are no available mood stabilizers that do not have a warning of elevating blood sugar and cholesterol.  He has tried all the mood stabilizers that do not have this warning.  All of the available mood stabilizers left that have any effectiveness are antipsychotics and the FDA has required a class warning on all antipsychotics about blood sugar and cholesterol effects.  This is true despite the fact that not all antipsychotics are equal at increasing these risks.  The patient's reading of these potential side effects is complicating finding an effective treatment.  The only way to prove whether or not these meds are elevating his blood sugar and cholesterol is to stop the medications for a period of time.  This exposes him to manic risk but there is no chance of finding an adequate mood stabilizer that will satisfy him until we can prove whether or not these meds are in fact affecting his blood sugar, cholesterol, blood pressure, and  pulse. ?Therefore he will stop the loxapine which is at a low dose. ?We will check labs in 3 weeks.  We will attempt follow-up in 4 weeks.  If he gets manic he will let us know.  ? ?08/13/2020 appointment with the following noted: ?Blood sugar is pretty much the same off the loxapine but BP and pulse improved from 110 to 70 range.  But had started BP med about mid September. ?Started counselor Illene Labrador at [facility] and started nutritionsist.  Has changed and restricted diet. ?Sees PCP 07/24/20.  Endo is managing DM.   ?Mood has been really good.  Counseling helped the anxiety.  Getting 10 hours of sleep.  PCP suggested changing time going to bed earlier and it's helping.   ?Sometimes has racing thoughts and gets comments from  others while playing video games that he needs to take a break. ?Patient reports stable mood and deniesr irritable moods.  Denies appetite disturbance.  Patient reports that energy and motivation have been good.  Patient denies any difficulty with concentration.  Patient denies any suicidal ideation.  Admits he's easily stressed.  ? ?10/09/20 appt with following noted: ?Getting manic as hell and depressed as hell. Talking too much and obsessing over things.  Talking too loud and doing things extremely fast.  Only depressed a couple of days ago.  Manic sx are brief and not all the time. ?Sleep 7-8 hours and sleep schedule is more normal. ?Stayed on Caplyta and pleased overall with SE. ?BS is better but hypertension is ongoing. ?Low TSH. ?Still counseling. ?No delusions hallucinations since off drugs. ?Give samples of Caplyta 42 mg once daily. ?Prefer no change befor ethe holidays bc he is not continuously manic or depressed. ?Likely switch to JordanLatuda after the holidays. ? ?11/19/2020 appointment with the following noted: ?Best I've ever been except some controllable manic.  Able to do things fast and easily.  Anxiety medicine and counseling has helped.  Better function.   No SE. ?Not depressed in a while.  But not sure the Capylta helps mania.   ?Lost from 230# to 189# with diet and exercise over a long period.   ?Sleep 4-8 hours, but not that I can't sleep but don't feel like slee

## 2022-03-19 ENCOUNTER — Ambulatory Visit (INDEPENDENT_AMBULATORY_CARE_PROVIDER_SITE_OTHER): Payer: 59 | Admitting: Neurology

## 2022-03-19 DIAGNOSIS — G478 Other sleep disorders: Secondary | ICD-10-CM

## 2022-03-19 DIAGNOSIS — G2581 Restless legs syndrome: Secondary | ICD-10-CM

## 2022-03-19 DIAGNOSIS — F3132 Bipolar disorder, current episode depressed, moderate: Secondary | ICD-10-CM

## 2022-03-19 DIAGNOSIS — R0683 Snoring: Secondary | ICD-10-CM

## 2022-03-19 DIAGNOSIS — G4730 Sleep apnea, unspecified: Secondary | ICD-10-CM

## 2022-03-19 DIAGNOSIS — Z794 Long term (current) use of insulin: Secondary | ICD-10-CM

## 2022-03-19 DIAGNOSIS — R5381 Other malaise: Secondary | ICD-10-CM

## 2022-03-19 DIAGNOSIS — E0969 Drug or chemical induced diabetes mellitus with other specified complication: Secondary | ICD-10-CM

## 2022-03-19 DIAGNOSIS — F250 Schizoaffective disorder, bipolar type: Secondary | ICD-10-CM

## 2022-03-19 DIAGNOSIS — E661 Drug-induced obesity: Secondary | ICD-10-CM

## 2022-03-19 DIAGNOSIS — G4710 Hypersomnia, unspecified: Secondary | ICD-10-CM

## 2022-03-25 ENCOUNTER — Encounter: Payer: Self-pay | Admitting: Internal Medicine

## 2022-03-26 ENCOUNTER — Other Ambulatory Visit: Payer: Self-pay

## 2022-03-26 DIAGNOSIS — E1165 Type 2 diabetes mellitus with hyperglycemia: Secondary | ICD-10-CM

## 2022-03-26 MED ORDER — BD PEN NEEDLE NANO U/F 32G X 4 MM MISC: 0 refills | Status: DC

## 2022-03-26 MED ORDER — CONTOUR NEXT MONITOR W/DEVICE KIT: PACK | 0 refills | Status: DC

## 2022-03-26 MED ORDER — CONTOUR NEXT TEST VI STRP
ORAL_STRIP | 12 refills | Status: DC
Start: 2022-03-26 — End: 2022-07-02

## 2022-03-26 MED ORDER — EMPAGLIFLOZIN 10 MG PO TABS: 10.0000 mg | ORAL_TABLET | Freq: Every day | ORAL | 5 refills | Status: DC

## 2022-03-27 ENCOUNTER — Encounter: Payer: Self-pay | Admitting: Internal Medicine

## 2022-04-06 ENCOUNTER — Other Ambulatory Visit: Payer: Self-pay | Admitting: Psychiatry

## 2022-04-06 DIAGNOSIS — L299 Pruritus, unspecified: Secondary | ICD-10-CM

## 2022-04-08 ENCOUNTER — Ambulatory Visit (INDEPENDENT_AMBULATORY_CARE_PROVIDER_SITE_OTHER): Payer: 59 | Admitting: Mental Health

## 2022-04-08 DIAGNOSIS — F3110 Bipolar disorder, current episode manic without psychotic features, unspecified: Secondary | ICD-10-CM
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
## 2022-04-09 ENCOUNTER — Telehealth: Payer: Self-pay

## 2022-04-09 NOTE — Telephone Encounter (Signed)
Would defer to primary care regarding recommendations for constipation.

## 2022-04-09 NOTE — Telephone Encounter (Signed)
Pt called and stated that he has been taking two stool softeners in the morning and he is still struggling to use the bathroom. He wants to know if he should take a third one or if you have any recommendations. Please advise.  Call Pt after 3:30 pm

## 2022-04-09 NOTE — Telephone Encounter (Signed)
Called pt, no answer. Left vm requesting call back?

## 2022-04-11 NOTE — Telephone Encounter (Signed)
Pt left another vm and stated that he left this message regarding the stool softener to the wrong office. He had meant to call his PCP.

## 2022-04-13 NOTE — Procedures (Signed)
PATIENT'S NAME:  Cody Short, Cody Short DOB:      Jan 20, 1976      MR#:    [id number]     DATE OF RECORDING: 03/19/2022 REFERRING M.D.:  Maryelizabeth Rowan, MD.   Dr Jacinto Halim Study Performed:   Baseline Polysomnogram HISTORY:  Cody Short is a year old Caucasian male patient seen here upon referral on 02/03/2022 from Dr [facility] office for a sleep consultation .  Chief concern according to patient :  "I feel always tired, have poor sleep- " non restorative sleep-referred for snoring after weight gain on Latuda. Bipolar condition better controlled on medication, but weight gain of 40 pounds.  "I am so fatigued and sleepy all the time, I wake up after hour long naps and feel more exhausted, yet insomnia is no longer a problem".  The patient endorsed the Epworth Sleepiness Scale at 12/24 points. RLS endorsed.  FSS at 60/63 points.  The patient's weight 230 pounds with a height of 69 (inches), resulting in a BMI of 34. kg/m2. The patient's neck circumference measured 18.2 inches.  CURRENT MEDICATIONS: [place] Pepper, Tricor, Atarax, Novolog, Semglee, Latuda, Glucophage, Diovan   PROCEDURE:  This is a multichannel digital polysomnogram utilizing the Somnostar 11.2 system.  Electrodes and sensors were applied and monitored per AASM Specifications.   EEG, EOG, Chin and Limb EMG, were sampled at 200 Hz.  ECG, Snore and Nasal Pressure, Thermal Airflow, Respiratory Effort, CPAP Flow and Pressure, Oximetry was sampled at 50 Hz. Digital video and audio were recorded.      BASELINE STUDY: Lights Out was at 22:01 and Lights On at 05:11.  Total recording time (TRT) was 430.5 minutes, with a total sleep time (TST) of 334 minutes.   The patient's sleep latency was 49.5 minutes.  REM latency was 157 minutes.  The sleep efficiency was 77.6 %.     SLEEP ARCHITECTURE: WASO (Wake after sleep onset) was 63.5 minutes.  There were 30 minutes in Stage N1, 167 minutes Stage N2, 63 minutes Stage N3 and 74 minutes in Stage  REM.  The percentage of Stage N1 was 9.%, Stage N2 was 50%, Stage N3 was 18.9% and Stage R (REM sleep) was 22.2%.   RESPIRATORY ANALYSIS:  There were a total of 88 respiratory events:  39 obstructive apneas, 0 central apneas and 10 mixed apneas with a total of 49 apneas and an apnea index (AI) of 8.8 /hour. There were 39 hypopneas with a hypopnea index of 7.0 /hour. The patient also had some snoring related arousals (RERAs).      The total APNEA/HYPOPNEA INDEX (AHI) was 15.8/h. 56 events occurred in REM sleep and 44 events in NREM.  The REM AHI was 45.4 /hour, versus a non-REM AHI of 7.4/h. The patient spent 208.5 minutes of total sleep time in the supine position and 126 minutes in non-supine.  The supine AHI was 20.1/h versus a non-supine AHI of 8.6.  OXYGEN SATURATION & C02:  The Wake baseline 02 saturation was 94%, with the lowest being 82%. Time spent below 89% saturation equaled 9 minutes.    PERIODIC LIMB MOVEMENTS:   The patient had a total of 43 Periodic Limb Movements. The Periodic Limb Movement (PLM) Arousal index was 0.5/hour. The arousals were noted as: 50 were spontaneous, 3 were associated with PLMs, 20 were associated with respiratory events.  Audio and video analysis did not show any abnormal or unusual movements, behaviors, phonations or vocalizations.   Snoring was noted. EKG was in keeping with normal  sinus rhythm (NSR).  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  Mild Periodic Limb Movement Disorder (PLMD) Moderately loud Snoring   RECOMMENDATIONS:  REM dependent apnea and hypopnea requires PAP therapy. It will also help to avoid supine sleep and to lose weight. Advise full night, attended, CPAP titration study to optimize therapy.   Plan B would be an at home autotitration with a ResMed auto CPAP device, set between 5- 15 cm water pressure, 2 cm EPR, with heated humidification and with  an interface of patient's comfort and choice to be provided.    I certify that I have reviewed the entire raw data recording prior to the issuance of this report in accordance with the Standards of Accreditation of the American Academy of Sleep Medicine (AASM)   Melvyn Novas, MD Medical Director, [facility]. Diplomat, Biomedical engineer of Neurology and Sleep Medicine (Neurology and Sleep Medicine)

## 2022-04-13 NOTE — Addendum Note (Signed)
Addended by: Melvyn Novas on: 04/13/2022 03:00 PM   Modules accepted: Orders

## 2022-04-14 ENCOUNTER — Telehealth: Payer: Self-pay | Admitting: Neurology

## 2022-04-14 NOTE — Telephone Encounter (Signed)
[employer] pending faxed notes 

## 2022-04-14 NOTE — Telephone Encounter (Signed)
I called pt. I advised pt that Dr. Vickey Huger reviewed their sleep study results and found that mild sleep apnea and recommends that pt be treated with a cpap. Dr. Vickey Huger recommends that pt return for a repeat sleep study in order to properly titrate the cpap and ensure a good mask fit. Pt is agreeable to returning for a titration study. I advised pt that our sleep lab will file with pt's insurance and call pt to schedule the sleep study when we hear back from the pt's insurance regarding coverage of this sleep study. Pt verbalized understanding of results. Pt had no questions at this time but was encouraged to call back if questions arise.  I was able to transfer to sleep lab to go ahead and get scheduled

## 2022-04-14 NOTE — Telephone Encounter (Signed)
CPAP Titration [employer] auth: [id number] (exp. 04/14/22 to 07/15/22). Patient is scheduled at [facility] for 04/20/22 at 9pm.

## 2022-04-14 NOTE — Telephone Encounter (Signed)
-----   Message from Melvyn Novas, MD sent at 04/13/2022  3:00 PM EDT ----- 1. 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  2. Mild Periodic Limb Movement Disorder (PLMD) 3. Moderately loud Snoring   RECOMMENDATIONS:  1. REM dependent apnea and hypopnea requires PAP therapy. It will also help to avoid supine sleep and to lose weight. 2. Advise full night, attended, CPAP titration study to optimize therapy.   3. Plan B would be an at home autotitration with a ResMed auto CPAP device, set between 5- 15 cm water pressure, 2 cm EPR, with heated humidification and with an interface of patient's comfort and choice to be provided.    I certify that I have reviewed the entire raw data recording prior to the issuance of this report in accordance with the Standards of Accreditation of the American Academy of Sleep Medicine (AASM)

## 2022-04-16 ENCOUNTER — Encounter: Payer: Self-pay | Admitting: Cardiology

## 2022-04-20 ENCOUNTER — Ambulatory Visit (INDEPENDENT_AMBULATORY_CARE_PROVIDER_SITE_OTHER): Payer: 59 | Admitting: Neurology

## 2022-04-20 DIAGNOSIS — R5381 Other malaise: Secondary | ICD-10-CM

## 2022-04-20 DIAGNOSIS — F3132 Bipolar disorder, current episode depressed, moderate: Secondary | ICD-10-CM

## 2022-04-20 DIAGNOSIS — G4733 Obstructive sleep apnea (adult) (pediatric): Secondary | ICD-10-CM

## 2022-04-20 DIAGNOSIS — R0683 Snoring: Secondary | ICD-10-CM

## 2022-04-20 DIAGNOSIS — Z794 Long term (current) use of insulin: Secondary | ICD-10-CM

## 2022-04-20 DIAGNOSIS — E0969 Drug or chemical induced diabetes mellitus with other specified complication: Secondary | ICD-10-CM

## 2022-04-20 DIAGNOSIS — F250 Schizoaffective disorder, bipolar type: Secondary | ICD-10-CM

## 2022-04-20 DIAGNOSIS — G2581 Restless legs syndrome: Secondary | ICD-10-CM

## 2022-04-20 DIAGNOSIS — R5382 Chronic fatigue, unspecified: Secondary | ICD-10-CM

## 2022-04-20 DIAGNOSIS — E661 Drug-induced obesity: Secondary | ICD-10-CM

## 2022-04-21 ENCOUNTER — Encounter: Payer: Self-pay | Admitting: Neurology

## 2022-04-22 ENCOUNTER — Encounter: Payer: Self-pay | Admitting: Internal Medicine

## 2022-04-23 ENCOUNTER — Other Ambulatory Visit: Payer: Self-pay | Admitting: Internal Medicine

## 2022-04-23 DIAGNOSIS — E1165 Type 2 diabetes mellitus with hyperglycemia: Secondary | ICD-10-CM

## 2022-04-24 ENCOUNTER — Encounter: Payer: Self-pay | Admitting: Internal Medicine

## 2022-04-24 NOTE — Telephone Encounter (Signed)
Rx sent to preferred pharmacy.

## 2022-04-29 ENCOUNTER — Encounter: Payer: Self-pay | Admitting: Neurology

## 2022-05-05 ENCOUNTER — Other Ambulatory Visit: Payer: Self-pay | Admitting: Cardiology

## 2022-05-05 DIAGNOSIS — E7800 Pure hypercholesterolemia, unspecified: Secondary | ICD-10-CM

## 2022-05-06 ENCOUNTER — Ambulatory Visit (INDEPENDENT_AMBULATORY_CARE_PROVIDER_SITE_OTHER): Payer: 59 | Admitting: Mental Health

## 2022-05-06 DIAGNOSIS — F3110 Bipolar disorder, current episode manic without psychotic features, unspecified: Secondary | ICD-10-CM | POA: Diagnosis not present

## 2022-05-06 DIAGNOSIS — F411 Generalized anxiety disorder: Secondary | ICD-10-CM | POA: Diagnosis not present

## 2022-05-06 NOTE — Progress Notes (Signed)
[facility]  Name: Cody Short Date: 05/06/22 MRN: [id number] DOB: 21-Mar-1976 PCP: Fanny Bien, MD  Time spent:  56 minutes  Treatment:   ind. Therapy  Virtual Visit via Telehealth Note Connected with patient by a telemedicine/telehealth application via video, with their informed consent, and verified patient privacy and that I am speaking with the correct person using two identifiers. I discussed the limitations, risks, security and privacy concerns of performing psychotherapy and the availability of in person appointments. I also discussed with the patient that there may be a patient responsible charge related to this service. The patient expressed understanding and agreed to proceed. I discussed the treatment planning with the patient. The patient was provided an opportunity to ask questions and all were answered. The patient agreed with the plan and demonstrated an understanding of the instructions. The patient was advised to call  our office if  symptoms worsen or feel they are in a crisis state and need immediate contact.   Therapist Location: office Patient Location: home     Mental Status Exam:    Appearance:    Casual     Behavior:   Appropriate  Motor:   WNL  Speech/Language:    Clear and Coherent  Affect:   Full range   Mood:   Euthymic, anxious  Thought process:   Logical, linear, goal directed  Thought content:     WNL  Sensory/Perceptual disturbances:     none  Orientation:   x4  Attention:   Good  Concentration:   Good  Memory:   Intact  Fund of knowledge:    Consistent with age and development  Insight:     Good  Judgment:    Good  Impulse Control:   Good     Reported Symptoms:  anxiety, rumination, intermittent depressed mood, impulsivity  Risk Assessment: Danger to Self:  No Self-injurious Behavior: No Danger to Others: No Duty to Warn:no Physical Aggression / Violence:No  Access to Firearms a concern: No  Gang  Involvement:No  Patient / guardian was educated about steps to take if suicide or homicide risk level increases between visits: yes While future psychiatric events cannot be accurately predicted, the patient does not currently require acute inpatient psychiatric care and does not currently meet [facility] involuntary commitment criteria.  Subjective:  Patient engaged in telehealth session via video. patient shared progress. Disgust work experiences where he said they've been busy recently and he is to probably get more hours assigned over the next few weeks. He went on to focus on a friendship these had with an individual he met online for the past 15 years. He stated that for the last month he has been unable to contact her via email which is their primary way of communicating. He said they have some mutual friends online as well that also not been able to reach her. Patient Express concern, however, is unsure of her last name as he only knew her first name does not have her address. At this point, patient stated he is trying to find some sense of closure and while providing support facilitated his identifying thoughts with which to focus on toward making progress while also allowing himself to identify the emotions he's experiencing. Patient stated that he feels he is improving somewhat since it's been about 4 weeks and was able to identify some thoughts to keep in focus which were that he has she as well and values their past friendship. Explored collaboratively ways  to cope where patient continues to try and engage in interest which primarily consists of online gaming. The value of having his job, giving him a sense of financial security and also helps him stay busy.   Interventions:  motivational interviewing, CBT    Diagnoses:    ICD-10-CM   1. Bipolar I disorder, most recent episode (or current) manic ([place])  F31.10     2. Generalized anxiety disorder  F41.1          Plan: Patient to  utilize coping skills as discussed, continue his medication compliance to maintain mood stability.  Long-term goals:   Maintain symptom reduction: The patient will report sustained reduction in symptoms of anxiety using both CBT and mindfulness interventions for 3 consecutive months progressively.  Improve emotional regulation: The patient will learn and apply CBT and mindfulness-based strategies to regulate emotions, such as mindfulness-based stress reduction and cognitive restructuring, and report an improvement in emotional regulation for at least 3 consecutive months progressively.    Short-term goal:  The patient will learn and apply CBT and mindfulness-based coping skills for managing anxiety and practice using it between sessions.       2.   The patient will CBT and mindfulness-based interventions to increase awareness of negative thought patterns. 3.   The patient will keep and maintain employment to keep financial stress manageable       4.   The patient will decrease anxiety specifically when driving.         [place], [facility]

## 2022-05-14 ENCOUNTER — Encounter: Payer: Self-pay | Admitting: Internal Medicine

## 2022-05-20 ENCOUNTER — Ambulatory Visit: Payer: 59 | Admitting: Cardiology

## 2022-05-27 ENCOUNTER — Encounter: Payer: Self-pay | Admitting: Internal Medicine

## 2022-05-28 ENCOUNTER — Encounter: Payer: Self-pay | Admitting: Cardiology

## 2022-05-28 NOTE — Telephone Encounter (Signed)
From patient.

## 2022-05-29 ENCOUNTER — Encounter: Payer: Self-pay | Admitting: Neurology

## 2022-06-07 ENCOUNTER — Encounter: Payer: Self-pay | Admitting: Internal Medicine

## 2022-06-09 ENCOUNTER — Other Ambulatory Visit: Payer: Self-pay

## 2022-06-09 DIAGNOSIS — E1165 Type 2 diabetes mellitus with hyperglycemia: Secondary | ICD-10-CM

## 2022-06-09 MED ORDER — EMPAGLIFLOZIN 10 MG PO TABS: 10.0000 mg | ORAL_TABLET | Freq: Every day | ORAL | 5 refills | Status: DC

## 2022-06-10 ENCOUNTER — Encounter: Payer: Self-pay | Admitting: Internal Medicine

## 2022-06-12 ENCOUNTER — Telehealth: Payer: Self-pay | Admitting: Pharmacy Technician

## 2022-06-12 ENCOUNTER — Other Ambulatory Visit (HOSPITAL_COMMUNITY): Payer: Self-pay

## 2022-06-12 NOTE — Telephone Encounter (Signed)
Patient Advocate Encounter   Received notification from RMA/Office that prior authorization for jardiance is required/requested.  Per Test Claim: step therapy required.    PA submitted on 06/12/22 to [employer] via CoverMyMeds Key [id number] Status is pending  Pharmacy Patient Advocate Fax:  [phone]

## 2022-06-12 NOTE — Telephone Encounter (Signed)
-----   Message from Kenyon Ana, [place] sent at 06/10/2022  4:21 PM EDT ----- Regarding: PA Can we get a PA submitted for Jardiance. Thank you.

## 2022-06-13 ENCOUNTER — Other Ambulatory Visit (HOSPITAL_COMMUNITY): Payer: Self-pay

## 2022-06-16 ENCOUNTER — Encounter: Payer: Self-pay | Admitting: Internal Medicine

## 2022-06-16 ENCOUNTER — Telehealth: Payer: Self-pay | Admitting: Psychiatry

## 2022-06-16 NOTE — Telephone Encounter (Signed)
Patient Advocate Encounter  Prior Authorization for [place] Pepper has been approved.   Effective: 06/12/2022 to 06/13/2023 Determination letter added to patient chart.  Burnell Blanks, CPhT Rx Patient Advocate Specialist Phone: [phone]

## 2022-06-16 NOTE — Telephone Encounter (Signed)
Cody Short  has new insurance.  It is the [employer].  It is in the system.  Be sure to use that new plan when getting the PA.

## 2022-06-16 NOTE — Telephone Encounter (Signed)
Pt LVM @ 2:41p on 8/19.  He called the first time saying he needed the Lat[place] sent to new mail order pharmacy [facility], phone [phone].  He called back at 2:49p and LVM that he found out from his insurance he needs a PA.  The phone number to call for the PA is [phone].  Next appt 8/28

## 2022-06-18 ENCOUNTER — Telehealth: Payer: Self-pay

## 2022-06-18 ENCOUNTER — Encounter: Payer: Self-pay | Admitting: Internal Medicine

## 2022-06-18 ENCOUNTER — Other Ambulatory Visit: Payer: Self-pay

## 2022-06-18 DIAGNOSIS — F3110 Bipolar disorder, current episode manic without psychotic features, unspecified: Secondary | ICD-10-CM

## 2022-06-18 DIAGNOSIS — E1165 Type 2 diabetes mellitus with hyperglycemia: Secondary | ICD-10-CM

## 2022-06-18 DIAGNOSIS — F411 Generalized anxiety disorder: Secondary | ICD-10-CM

## 2022-06-18 DIAGNOSIS — F4001 Agoraphobia with panic disorder: Secondary | ICD-10-CM

## 2022-06-18 MED ORDER — LURASIDONE HCL 80 MG PO TABS: 80.0000 mg | ORAL_TABLET | Freq: Every day | ORAL | 0 refills | Status: DC

## 2022-06-18 NOTE — Telephone Encounter (Signed)
Noted - thanks for update 

## 2022-06-18 NOTE — Telephone Encounter (Signed)
Prior Authorization for LURASIDONE 80 MG initiated and submitted with [employer] pending response

## 2022-06-18 NOTE — Telephone Encounter (Signed)
His refill has been submitted to [facility] as well as a PA initiated, pending response

## 2022-06-19 MED ORDER — EMPAGLIFLOZIN 10 MG PO TABS: 10.0000 mg | ORAL_TABLET | Freq: Every day | ORAL | 1 refills | Status: DC

## 2022-06-19 NOTE — Telephone Encounter (Signed)
Prior Approval received effective 06/18/2022-06/18/2025 for LURASIDONE 80 MG with [facility].

## 2022-06-19 NOTE — Telephone Encounter (Signed)
Prior Approval received for LURASIDONE 80 MG effective 06/18/2022-06/18/2025 with [employer].

## 2022-06-23 ENCOUNTER — Telehealth (INDEPENDENT_AMBULATORY_CARE_PROVIDER_SITE_OTHER): Payer: 59 | Admitting: Psychiatry

## 2022-06-23 ENCOUNTER — Encounter: Payer: Self-pay | Admitting: Psychiatry

## 2022-06-23 DIAGNOSIS — F3110 Bipolar disorder, current episode manic without psychotic features, unspecified: Secondary | ICD-10-CM | POA: Diagnosis not present

## 2022-06-23 DIAGNOSIS — F411 Generalized anxiety disorder: Secondary | ICD-10-CM | POA: Diagnosis not present

## 2022-06-23 DIAGNOSIS — F4001 Agoraphobia with panic disorder: Secondary | ICD-10-CM

## 2022-06-23 DIAGNOSIS — F5105 Insomnia due to other mental disorder: Secondary | ICD-10-CM

## 2022-06-23 DIAGNOSIS — F1211 Cannabis abuse, in remission: Secondary | ICD-10-CM

## 2022-06-23 DIAGNOSIS — R69 Illness, unspecified: Secondary | ICD-10-CM | POA: Diagnosis not present

## 2022-06-23 NOTE — Progress Notes (Signed)
Cody Short [id number] 01-12-1976 46 y.o.   Virtual Visit via Telephone Note  I connected with pt by telephone and verified that I am speaking with the correct person using two identifiers.   I discussed the limitations, risks, security and privacy concerns of performing an evaluation and management service by telephone and the availability of in person appointments. I also discussed with the patient that there may be a patient responsible charge related to this service. The patient expressed understanding and agreed to proceed.  I discussed the assessment and treatment plan with the patient. The patient was provided an opportunity to ask questions and all were answered. The patient agreed with the plan and demonstrated an understanding of the instructions.   The patient was advised to call back or seek an in-person evaluation if the symptoms worsen or if the condition fails to improve as anticipated.  I provided 30 minutes of non-face-to-face time during this encounter. The patient was located at home and the provider was located office.  Session from  [id number]   Subjective:   Patient ID:  Cody Short is a 45 y.o. (DOB February 12, 1976) male.  Chief Complaint:  Chief Complaint  Patient presents with   Follow-up   Manic Behavior     Cody Short presents to the office today for follow-up of several psychiatric diagnoses ...    seen August 2020.  For obsessive anxiety  Switched Lexapro 5 mg daily on May 18.  But he stated it made him hungry and so it was stopped in August.  He continued on Saphris 10 mg nightly, lithium 1200 mg daily, propranolol as needed tremor.  Lithium level was ordered.  He called Sept saying he was manic.  Had a lithium level 0.7 and so dose was increased to 1500 mg daily.    seen December 21, 2019.  He had some ongoing manic symptoms and Saphris was increased to 15 mg nightly.  02/07/2020 appointment with the following noted: He  has made multiple phone calls to the office since that date.  He decided he wanted to stop lithium because of urinary problems.  He was warned this could lead to mania but he wanted to pursue it anyway.  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  Cody Short had been having bedwetting episodes which she attributed to the lithium.  He has not consistently taken Saphris 15 mg at bedtime since the office visit in February. He completely stopped lithium January 30, 2020 U frequency is much better and less thirsty.  Bedwetting resolved.  Reduced BM to 1-2 times daily.  Gassy.  Glucose is high.  B is hospitalized with unknown disease.  Insulin adjusted. Mental status is best it's ever been.  Less mania.  Less obsessing.  No worsening mania. Is taking Saphris 15 mg HS> Plan no med changes.  04/10/2020 appointment with the following noted: No longer on lithium.  Still frequent urination and thirst DT DM poorly controlled despite meds.  Endo says little else to take bc history of pancreatitis.  Says he goes to urinate q 15 mins. Plan: Wean Saphris in crossover to loxapine 150 mg HS over 2 weeks bc he and endocrinologist believe it's likely Saphris is cause of the inability to get his glucose to acceptable levels.   06/07/20 appt with the following noted: He's called a couple of times CO sleepiness and reduced loxapine to 50 mg HS. Started Vegan diet and BS is  better 4 weeks ago.  Thinks glucose is better off the Saphris. Lost 6 #.   CO sleepiness and sleep 16 hours   He thinks it also drove up pulse and blood pressure. Asks about taking Saphris 10 and a second medication. Mood has been fine.   Plan: He wants to restart Saphris 10 mg HS and use a 2nd mood stabilizer. Start Saphris and reduce Loxapine  To 10 mg at night DT prior failure of 10 Mg Saphris with lithium.  06/27/2020 phone call from patient: Pt called to report Loxapine & Saphris is causing his BP to be very  high and Saphris is causing blood sugar to be high. Apt 10/18. Need changes before then. Call back @ [phone]  MD response: He had wanted to go back on Saphris and reduce loxapine which we did.  Tell him to stop the Saphris and increase loxapine to 20 mg nightly.  He did not tolerate 50 mg of loxapine very well. Nurse TC withpt: Spoke with Cody Short and he will stop the Saphris, but feels the increase in the Loxapine to 20 mg will increase his blood sugars more. Explained that with coming off the [place] he needs to increase it a bit or he will not be stable with his moods. Advised him to stop the Saphris and would check with Dr. [name] about any other recommendations.   07/17/20 appt with the following noted: Doing terrible.  Needs melatonin to sleep.  Claims loxapine is elevating BP, pulse, TG, cholesterol, glucose.  Claimed Saphris did it too.   Sees PCP at end of October.   Started Vegan diet about 6 weeks ago and exercising.   A/P: There are no available mood stabilizers that do not have a warning of elevating blood sugar and cholesterol.  He has tried all the mood stabilizers that do not have this warning.  All of the available mood stabilizers left that have any effectiveness are antipsychotics and the FDA has required a class warning on all antipsychotics about blood sugar and cholesterol effects.  This is true despite the fact that not all antipsychotics are equal at increasing these risks.  The patient's reading of these potential side effects is complicating finding an effective treatment.  The only way to prove whether or not these meds are elevating his blood sugar and cholesterol is to stop the medications for a period of time.  This exposes him to manic risk but there is no chance of finding an adequate mood stabilizer that will satisfy him until we can prove whether or not these meds are in fact affecting his blood sugar, cholesterol, blood pressure, and pulse. Therefore he will stop the  loxapine which is at a low dose. We will check labs in 3 weeks.  We will attempt follow-up in 4 weeks.  If he gets manic he will let us know.   08/13/2020 appointment with the following noted: Blood sugar is pretty much the same off the loxapine but BP and pulse improved from 110 to 70 range.  But had started BP med about mid September. Started counselor Sonia Side at [facility] and started nutritionsist.  Has changed and restricted diet. Sees PCP 07/24/20.  Endo is managing DM.   Mood has been really good.  Counseling helped the anxiety.  Getting 10 hours of sleep.  PCP suggested changing time going to bed earlier and it's helping.   Sometimes has racing thoughts and gets comments from others while playing video games that  he needs to take a break. Patient reports stable mood and deniesr irritable moods.  Denies appetite disturbance.  Patient reports that energy and motivation have been good.  Patient denies any difficulty with concentration.  Patient denies any suicidal ideation.  Admits he's easily stressed.   10/09/20 appt with following noted: Getting manic as hell and depressed as hell. Talking too much and obsessing over things.  Talking too loud and doing things extremely fast.  Only depressed a couple of days ago.  Manic sx are brief and not all the time. Sleep 7-8 hours and sleep schedule is more normal. Stayed on Caplyta and pleased overall with SE. BS is better but hypertension is ongoing. Low TSH. Still counseling. No delusions hallucinations since off drugs. Give samples of Caplyta 42 mg once daily. Prefer no change befor ethe holidays bc he is not continuously manic or depressed. Likely switch to [place] after the holidays.  11/19/2020 appointment with the following noted: Best I've ever been except some controllable manic.  Able to do things fast and easily.  Anxiety medicine and counseling has helped.  Better function.  No SE. Not depressed in a while.   But not sure the Capylta helps mania.   Lost from 230# to 189# with diet and exercise over a long period.   Sleep 4-8 hours, but not that I can't sleep but don't feel like sleeping.  Not itching any more.  I'm a a whole new person.   I can't keep up the mania.  Driving my parents and friends crazy. Blood sugar better and off insulin.  Only taking metformin. Can't afford Caplyta. So feels he must change. Hydroxyzine really helped anxiety. Plan: DC Caplyta DT mania.  switch to Latuda 40 then 80 mg with food.  Pick up samples.  12/03/2020 phone call from parents:Parents called and want dr. [name] to know that Cody Short is maniac and he is having physical aggression and all he does is play video games 24 hours a day and is not sleeping. Parents feel like Cody Short is not giving you the whole picture and needs an adjustment to his medicine. MD response:atient acknowledged that his appointment on January 24 that he was manic while taking Caplyta.  He also acknowledged that his family saw him as being manic.  We discontinued Caplyta and switched him to [place].  If he made the switch and increase the dose in a timely manner he should have been on 80 mg daily for a week now.  His parents are complaining that he is still manic.  Provided he is tolerating the Latuda reasonably well have him increase to 120 mg daily.  He can take 1-1/2 of the 80 mg tablets until he runs out and then we can send in a refill for 120 mg daily.  Remind him he needs to take that with at least 350 cal. Phone call with patient from nurse: Patient rtc and he did report he's doing great on the medication but is still having mania. Reports staying up all night playing video games then asleep about 5 am and back up at noon. He is taking medication with 350 calories also. Advised him to increase to 120 mg Latuda daily. He reports having only 40 mg tablets at home so instructed him to take 3 tablets and I would get more samples out for him to pick up  tomorrow. He agreed and verbalized understanding.  12/07/2020 phone call: Patient called again about his Latuda. He states that it makes  him really hungry after he takes it, he takes a nap and wakes up hungry again. He would like to know would it be okay to take before bed instead. MD response: He is unlikely to comply with recommendations about routine sleep and wake times.  He is in a habit of playing video games late into the night.  If he prefers he can split the dosage of the Latuda between breakfast and another meal.  But it is very important that he get the full dose in every day and take it with 350 cal. Nursing phone call with patient:Rtc to patient and he reports he has to take it all at hs because it makes him sleepy, I advised him to continue that regimen. He also reports not sleeping well but he's been drinking caffeine later in the day. Advised him to not drink any caffeine past 2-3 pm. He agreed. He reports his blood sugars have been elevated some, not as bad but it is up some.   12/20/2020 appointment with the following noted: Mania tremendously better but occ anger outbursts anger.  6 hours of sleep with melatonin, hydroxyzine and Latuda 120 mg for 2 weeks.  Staying up all night playing video games. Anette Guarneri does make him hungry right after he takes it so takes it before bedtime.  Taking with food. Hyperactivity and loud talking is better.  I can function fast.  Parents see benefit with mania but are concerned about his anger outbursts but he things she takes stress out on him.  Blood sugar is generally good except when eats too much.  Taking it with dinner and HS.   Able to drive around town.  Can cook and clean.  Less ruminating on his past.  Less need for direction.   Counseling is helping at [facility] counseling. Hydroxyzine helped itching and anxiety.  Plan: Continue Latuda 120 mg PM.  Is getting benefit but only been on it for a couple of weeks.   01/17/2021 appointment with  following noted: So so.  Some anger outburts a couple of times.  Eats too much.  Blood sugar is pretty bad.  Up at night playing video games and then sleeps too late in the day.  Takes Latuda at evening meal.  Wants to blame meds for these behaviors. Otherwise mood is "good I guess".  No serious highs or lows.  Can get in arguments with mother when she tries to correct him and may feel guilty afterwards. Takes hydroxyzine q 6 hours because of anxiety and bc dx thyroid inflamed, thyroiditis.  Asks if there's anything to help with overeating. Lost weight from thyroid.   Plan: Continue Latuda 120 mg as he is tolerating it and it appears to be controlling mania better than did the Caplyta and he is tolerating it better.  02/12/2021 appointment with the following noted: Perfectly good with mood and depression.  Needs hydroxyzine for itching and anxiety.  Energy poor with thyroiditis. Will sleep too much too bc gets bored and no physical activity. Varies 9-16 hours daily.  Can nap easily.  Not spacy.   No anger lately. Tolerating Latuda well.   Still eats too many sweets late at night and gets high blood sugars.   Plan: Increase  topiramate 50 mg BID off label for weight loss and disc SE.  He wants to try something.  03/20/2021 appointment with the following noted: Taking meds.  A little benefit with increase topiramate. Mood is perfectly fine without mania nor depression. Blood  sugar is a bit high. Claims doctor thinks rash could be related to [facility] but he sleeps all the time bc hypothyroidism. Has folliculitis Started treatment for it and it is helping.   Thyroid problem is a wait and see.  Could take a year to get better. Doesn't think topiramate added tiredness. Not taking hydroxyzine much about once daily. Dealing with difficult GM threatening sui if left alone.  Plan: Continue Latuda 120 mg PM.  Is getting benefit.. Better tolerated than others so far but he does say it makes him hungry  but he has chronic impulse control problems including overeating.  05/06/2021 appointment with the following noted: Quit topiramate DT nausea and not helping at all. Rash resolved. Lipids not terrible but a little up.  TSH normalized. HGBA1C 9.3 Not exercising. Not doing much re: work etc other than video games. Not manic lately. Patient reports stable mood and denies depressed or irritable moods.  Patient denies any recent difficulty with anxiety.   Denies appetite disturbance.  Patient reports that energy and motivation have been good.  Patient denies any difficulty with concentration.  Patient denies any suicidal ideation. Energy is better than it was but still sleeping 12 hours daily and up at night playing videos with friends. Plan: Reduce Latuda to 80 mg to reduce excessive sleepiness.  If mania then increase it back up 120 mg PM.  Is getting benefit..  06/20/21 TC:  Pt stated the cramps have spread to his arms and legs.He has been on latuda 80 mg qd for awhile now.He stated he is also going to see a cardiologist due to heart spasms. MD response:  The cramps are not likely related to [place].  Usually it is related to dehydration although I see from chart review that he had elevated creatinine kinase from a statin and that was probably causing some of his problem.  He needs to make sure he drinks lots of water.  Especially because he is diabetic and that will make him prone to dehydration if he is not controlling his blood sugar. However the sleepiness might be related to [place].  He had previously been on 120 mg daily and we reduced it to 80 mg daily and if he is done okay with his mood we can try reducing it 1 more time to 60 mg.  I will send in a prescription for the 60 mg tablet and he can reduce it if he would like to see if the sleepiness is related to [place].  If Anette Guarneri is contributing to sleepiness it will get better if he reduces the dose.  If the sleepiness does not get better with the  dosage reduction then Latuda is not the cause Reduce Latuda to 60 mg daily  07/30/2021 appt noted: Not much difference in alertness Mania since reducing Latuda. Hypoactive bc thryroid problems and may be the cause of the cramps per the other doctors. Itches badly without hydroxyzine. Spent $500 on gambling game and adeleted.  Past history of drug induced gambling but no major drugs in 15 years. Still exhausted and sleep all day.  But is taying up at night.  Sleeping 16 hours daily.  Wonders if hydroxyzine 25 is making him tired. Itching worse with stress and stress of GM and B crazy. Asks about bipolar shot. Side sleeper and as far as he knows no excessive snoring.  08/23/2021 phone call from patient's father stating that Cody Short had "gone off the rails" and it spent $1000 on the night prior  doing online gambling and had gone back to staying up all night and sleeping during the day.  He had also been more verbally abusive.  It was suggested that the Latuda dose be increased from 60 to 80 mg in the evening.  10/09/2021 appointment with the following noted: Sleeping a lot and easily exhausted even after med were changed.   SE a little constipation. History of problems with gambling in the past but not current.  Not thinking of it now. No other manic sx currently.   On Latuda 80 daily now.  Reduced hydroxyzine to 10 and still sleepy.  No other psych meds Plan: Continue Latuda 80 mg bc manic with less .  If recurrence of mania then increase it back up 120 mg PM.  Is getting benefit..  11/18/2021 phone call: Patient had complained of elevated CK levels and wondered if it could be related to [place].  Dr. [name] called Dr. Ernie Hew and discussed this issue.  It appears that his CK levels have actually been declining over the last several months and are not at a critical level.  It was agreed that this could be monitored over time and no immediate med change was necessary.  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   1 episode when got angy and said awful things to mother but then apologized.  Only 1 bad day. Otherwise mood has been pretty stable lately. Constipation and can't take miralax bc gives him diarrhea.   Sleep normalized to 8 hours since started his job.  So the med was not the source of the sleepiness. Counselor next Tuesday Only hydroxyzine is 10 mg HS Only other psych med is Latuda 80 mg daily Plan: Latuda  Been the best med so far for him.     Sleepiness was not better with reduction in Latuda from 120 to 60 mg daily. Continue Latuda 80 mg bc manic with less .  Option split dose to help sleepiness .   03/12/22 appt noted: Taking hydroxyzine BID Sleep study next week. 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  At [employer]. Doing well emotionally. Constipation and GI doc tomorrow DT fissure. BS is better A1C 7.4 instead of 9+ Sleep 8 hours.   The only thing he doesn't like is 4 meals daily bc hard to lose weight. Not much mania nor depression. Still anxious and worse without hydroxyzine. Consistent with Latuda at bedtime with food.   Rash bettter .  Itching managed with BID hydroxyzine. Plan: Continue Latuda 80 mg bc manic with less .  Option split dose to help sleepiness .  If recurrence of mania then increase it back up 120 mg PM.  Is getting benefit..  06/23/22 appt noted: Rough 2 weeks.  [place] for 44 yo girl at work and was somewhat manic with her and she quit the job.  But is beginning to feel better now. Dx OSA CPAP and tremendous difference.  Not sleeping as much and more productive at work.  But still some initial sleepiness in the AM Not constipation or  diarrhea but slow to move bowels but takes several times in the AM. Started Colace  2 daily for  the last couple of days. Lost 10-15# and now stagnant. Working and eating less and weights off and on 3 weeks and trouble losing weight. At work asking too many questions and is too anxious.  Wonders about more hydroxyzine. Sober for 12 years. Can't split Latuda bc gets too sleepy and takes it too much at night. Had same job 6 mos.  Got employee of the month.   Prior psychiatric medications: risperidone with cognitive side effects, Abilify NR,  Zyprexa SE glucose, Seroquel 600 mg SE wt, perphenazineSE, Saphris 20 partial response but SE increase glucose , haloperidol EPS, Geodon EPS,  Vraylar, loxapine SE, Caplyta NR, Latuda 120 He's prone to EPS.  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 lithium 1500,   N-acetylcysteine with minimal benefit, , Amantadine with vomiting.  Sertraline and lexapro sexual SE,   Propranolol for tremor,     Review of Systems:  Review of Systems  Constitutional:  Positive for fatigue.  Gastrointestinal:  Positive for constipation and rectal pain.  Endocrine: Positive for polydipsia and polyuria.  Genitourinary:  Negative for decreased urine volume and frequency.  Musculoskeletal:  Positive for myalgias.  Skin:  Positive for rash.       Itching resolved with hydroxyzine  Neurological:  Negative for tremors and weakness.  Psychiatric/Behavioral:  Positive for decreased concentration. Negative for agitation, behavioral problems, confusion, dysphoric mood, hallucinations, self-injury, sleep disturbance and suicidal ideas. The patient is hyperactive.     Medications: I have reviewed the patient's current medications.  Current Outpatient Medications  Medication Sig Dispense Refill   Adapalene-Benzoyl Peroxide 0.1-2.5 % gel SMARTSIG:1 Application  Topical Every Evening     Blood Glucose Monitoring Suppl (CONTOUR NEXT MONITOR) w/Device KIT Check sugar 4 times daily 1 kit 0   Blood Glucose Monitoring Suppl (FREESTYLE LITE) DEVI Use as instructed to check sugar 4 times daily 1 each 0   Docusate Sodium (COLACE PO) Take 2 tablets by mouth daily.     empagliflozin (JARDIANCE) 10 MG TABS tablet Take 1 tablet (10 mg total) by mouth daily. 90 tablet 1   ezetimibe (ZETIA) 10 MG tablet TAKE 1 TABLET (10 MG TOTAL) BY MOUTH DAILY AFTER SUPPER. 90 tablet 0   famotidine (PEPCID) 40 MG tablet Take 40 mg by mouth 2 (two) times daily.     fenofibrate (TRICOR) 145 MG tablet TAKE 1 TABLET BY MOUTH EVERY DAY 90 tablet 0   glucose blood (CONTOUR NEXT TEST) test strip Check sugar 4 times daily 400 each 12   hydrOXYzine (ATARAX) 10 MG tablet TAKE 1 TABLET BY MOUTH THREE TIMES A DAY AS NEEDED (Patient taking differently: Take 10 mg by mouth at bedtime.) 270 tablet 0   insulin aspart (NOVOLOG FLEXPEN) 100 UNIT/ML FlexPen Inject 35-45 Units into the skin in the morning, at noon, in the evening, and at bedtime. (Patient taking differently: Inject 5-20 Units into the skin in the morning, at noon, in the evening, and at bedtime.) 45 mL 11   insulin glargine-yfgn (SEMGLEE) 100 UNIT/ML Pen Inject 60 Units into the skin at bedtime. 45 mL 3   Insulin Pen Needle (BD PEN NEEDLE NANO U/F) 32G X 4 MM MISC USE 4 TIMES DAILY WITH INSULIN PENS 400 each 0   lurasidone (LATUDA) 80 MG TABS tablet Take 1 tablet (80 mg total) by mouth daily with breakfast. 90 tablet 0   metFORMIN (GLUCOPHAGE-XR) 500 MG 24 hr tablet  TAKE 4 TABLETS (2,000 MG TOTAL) BY MOUTH DAILY WITH SUPPER. 360 tablet 0   OneTouch Delica Lancets 27O MISC 1 each by Does not apply route 4 (four) times daily. Use Onetouch Delica lancets to check blood sugar 4 times daily. DX:E11.65 400 each 11   valsartan (DIOVAN) 160 MG tablet Take 160 mg by mouth daily.     No current facility-administered medications for this visit.     Medication Side Effects: Other: less tremor  Allergies:  Allergies  Allergen Reactions   Tricor [Fenofibrate] Other (See Comments)    Myopathy   Divalproex Sodium Other (See Comments)    unknown   Methylphenidate Hcl     Aggravate bipolar   Oxycodone Hcl     REACTION: hallucinations   Ozempic (0.25 Or 0.5 Mg-Dose) [Semaglutide(0.25 Or 0.5m-Dos)] Nausea Only   Paroxetine     Aggravate bipolar disorder   Rosuvastatin Other (See Comments)    Myopathy -muscle cramps, elevated CK    Past Medical History:  Diagnosis Date   Acne varioliformis 07/04/2010   Anxiety    [place]    Depression    DM w/o Complication Type II [id number]  High cholesterol    Hypertension    HYPERTRIGLYCERIDEMIA 10/14/2010   Nocturia 07/04/2010   PILAR CYST 08/03/2007   Cyst in groin-states comes up when blood sugar gets high. Recurs if cannot walk for a week.      TOBACCO ABUSE, HX OF 07/31/2009    Family History  Problem Relation Age of Onset   Diabetes Mother    Breast cancer Mother        was told not hereditary   Cirrhosis Mother        fatty liver   High blood pressure Mother    High blood pressure Father    Diabetes Father    Melanoma Maternal Uncle    Colon cancer Neg Hx    Stomach cancer Neg Hx    Pancreatitis Neg Hx    Heart disease Neg Hx    Kidney disease Neg Hx    Liver disease Neg Hx     Social History   Socioeconomic History   Marital status: Single    Spouse name: Not on file   Number of children: 0   Years of education: Not on file   Highest education level: Bachelor's degree (e.g., BA, AB, BS)  Occupational History   Not on file  Tobacco Use   Smoking status: Former    Packs/day: 2.50    Years: 10.00    Total pack years: 25.00    Types: Cigarettes    Quit date: 10/27/2006    Years since quitting: 15.6   Smokeless tobacco: Never  Vaping Use   Vaping Use: Never used  Substance and Sexual Activity   Alcohol use: Not Currently     Comment: none at all   Drug use: No   Sexual activity: Not on file  Other Topics Concern   Not on file  Social History Narrative   Single. Not dating currently. No kids.    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[employer] for [employer]   Caffeine: 2 C a day   Social Determinants of HRadio broadcast assistantStrain: Not on fComcastInsecurity: Not on file  Transportation Needs: Not on file  Physical Activity: Not on file  Stress: Not on file  Social Connections: Not on file  Intimate Partner Violence: Not on file    Past Medical History, Surgical history, Social history, and Family history were reviewed and updated as appropriate.   Please see review of systems for further details on the patient's review from today.   Objective:   Physical Exam:  There were no vitals taken for this visit.  Physical Exam Neurological:     Mental Status: He is alert and oriented to person, place, and time.     Cranial Nerves: No dysarthria.  Psychiatric:        Attention and Perception: Attention normal.        Mood and Affect: Mood is anxious. Mood is not depressed or elated.        Speech: Speech is not rapid and pressured or slurred.        Behavior: Behavior is not agitated, aggressive or hyperactive. Behavior is cooperative.        Thought Content: Thought content is not paranoid or delusional. Thought content does not include homicidal or suicidal ideation. Thought content does not include suicidal plan.        Cognition and Memory: Cognition and memory normal.     Comments: No paranoia.   Insight and judgment fair to poor. Chronically self-preoccupied and hyperthymic style better. Much Less pressured and less manic presently.  But varies      Lab Review:     Component Value Date/Time   NA 139 05/17/2021 1952   K 3.8 05/17/2021 1952   CL 105 05/17/2021 1952   CO2 23 05/17/2021 1952   GLUCOSE 88  05/17/2021 1952   BUN 12 05/17/2021 1952   CREATININE 1.10 05/17/2021 1952   CREATININE 1.24 06/18/2018 1502   CALCIUM 9.2 05/17/2021 1952   PROT 7.5 05/17/2021 1952   ALBUMIN 4.7 05/17/2021 1952   AST 54 (H) 05/17/2021 1952   ALT 45 (H) 05/17/2021 1952   ALKPHOS 65 05/17/2021 1952   BILITOT 0.3 05/17/2021 1952   GFRNONAA >60 05/17/2021 1952   GFRNONAA 84 04/02/2018 1551   GFRAA 97 04/02/2018 1551       Component Value Date/Time   WBC 7.9 05/17/2021 1952   RBC 4.60 05/17/2021 1952   HGB 13.6 05/17/2021 1952   HCT 40.8 05/17/2021 1952   PLT 243 05/17/2021 1952   MCV 88.7 05/17/2021 1952   MCH 29.6 05/17/2021 1952   MCHC 33.3 05/17/2021 1952   RDW 13.2 05/17/2021 1952   LYMPHSABS 1.9 06/27/2016 1441   MONOABS 0.8 06/27/2016 1441   EOSABS 0.2 06/27/2016 1441   BASOSABS 0.1 06/27/2016 1441    Lithium Lvl  Date Value Ref Range Status  09/09/2019 1.1 0.6 - 1.2 mmol/L Final    12/14/19 lithium level 0.6 (trough 13 hours) , normal BMP and calcium   No results found for: "PHENYTOIN", "PHENOBARB", "VALPROATE", "CBMZ"   .res Assessment: Plan:    Matheo was seen today for follow-up and manic behavior.  Diagnoses and all orders for this visit:  Bipolar I disorder, most recent episode (or current) manic ([place])  Generalized anxiety disorder  Panic disorder with agoraphobia  Marijuana abuse in remission  Insomnia due to mental condition   Cody Short has had significant disruptive mood and psychotic symptoms and substance abuse over the course of his treatment here.  It has been evident in chronic occupational dysfunction and some relational problems.  He is also been very prone to EPS and elevated blood sugars from  atypicals making it difficult to get adequate mood stabilization.   Has a history of severe pancreatitis from Depakote.  He did not have an adequate response to carbamazepine.   lithium was insufficient to control his symptoms in monotherapy and he developed diabetes  insipidus.  There are no available mood stabilizers that do not have a warning of elevating blood sugar and cholesterol.  He has tried all the mood stabilizers that do not have this warning.  All of the available mood stabilizers left that have any effectiveness are antipsychotics and the FDA has required a class warning on all antipsychotics about blood sugar and cholesterol effects.  This is true despite the fact that not all antipsychotics are equal and increasing these risks.  The patient's reading of these potential side effects is complicating finding an effective treatment.  The only way to prove whether or not these meds are elevating his blood sugar and cholesterol is to stop the medications for a period of time.  This exposes him to manic risk but there is no chance of finding an adequate mood stabilizer that will satisfy him until we can prove whether or not these meds are in fact affecting his blood sugar, cholesterol, blood pressure, and pulse.  Latuda  Been the best med so far for him.     Sleepiness was not better with reduction in Latuda from 120 to 60 mg daily. Continue Latuda 80 mg bc manic with less .  Option split dose to help sleepiness .  If recurrence of mania then increase it back up 120 mg PM.  Is getting benefit.. Better tolerated than others so far but he does say it makes him hungry but he has chronic impulse control problems including overeating. .   Discussed potential metabolic side effects associated with atypical antipsychotics, as well as potential risk for movement side effects. Advised pt to contact office if movement side effects occur.   continue Hydroxyzine 10-20 mg TID prn  This has been helpful for anxiety and itching and may have some mild antimanic effect.  Consider modafinil if necessary for function.  Disc risk of mania.  Disc SE.    He has a low stress tolerance.  He can be easily agitated in public. OK to schedule with Lanetta Inch for therapy  He has  continued abstinence from marijuana is encouraged.  He has a history of cannabinoid hyperemesis which finally convinced him to discontinue the marijuana.  His mood disorder and thought disorganization have improved since being off the marijuana.  He has a very long history of heavy marijuana dependence.   Constipation management 1.  Lots of water 2.  Powdered fiber supplement such as MiraLAX, Citrucel, etc. preferably with a meal 3.  2 stool softeners a day 4.  Milk of magnesia or magnesium tablets if needed Try lower dose miralax. He's going to see GI doc  Sleep hygiene. Disc OSA and how it affects alertness and started CPAP and much better.    Follow-up 12 weeks bc chronically unstable and typical phone calls between appts. But this has been better lately    Lynder Parents, MD, DFAPA  Please see After Visit Summary for patient specific instructions.  Future Appointments  Date Time Provider [place]  07/15/2022  4:00 PM Philemon Kingdom, MD [facility] None  09/17/2022  3:30 PM Dohmeier, Asencion Partridge, MD [facility] None    No orders of the defined types were placed in this encounter.      -------------------------------

## 2022-06-24 ENCOUNTER — Ambulatory Visit: Payer: 59 | Admitting: Mental Health

## 2022-06-26 DIAGNOSIS — E782 Mixed hyperlipidemia: Secondary | ICD-10-CM | POA: Diagnosis not present

## 2022-06-26 DIAGNOSIS — G4733 Obstructive sleep apnea (adult) (pediatric): Secondary | ICD-10-CM | POA: Diagnosis not present

## 2022-06-26 DIAGNOSIS — Z23 Encounter for immunization: Secondary | ICD-10-CM | POA: Diagnosis not present

## 2022-06-26 DIAGNOSIS — I10 Essential (primary) hypertension: Secondary | ICD-10-CM | POA: Diagnosis not present

## 2022-06-26 DIAGNOSIS — L708 Other acne: Secondary | ICD-10-CM | POA: Diagnosis not present

## 2022-06-26 DIAGNOSIS — Z7185 Encounter for immunization safety counseling: Secondary | ICD-10-CM | POA: Diagnosis not present

## 2022-06-26 DIAGNOSIS — M545 Low back pain, unspecified: Secondary | ICD-10-CM | POA: Diagnosis not present

## 2022-06-27 ENCOUNTER — Encounter: Payer: Self-pay | Admitting: Internal Medicine

## 2022-06-27 DIAGNOSIS — E1165 Type 2 diabetes mellitus with hyperglycemia: Secondary | ICD-10-CM

## 2022-07-02 ENCOUNTER — Ambulatory Visit: Payer: No Typology Code available for payment source | Admitting: Dermatology

## 2022-07-02 MED ORDER — ACCU-CHEK GUIDE VI STRP: ORAL_STRIP | 3 refills | Status: DC

## 2022-07-02 MED ORDER — ACCU-CHEK SOFTCLIX LANCETS MISC: 3 refills | Status: AC

## 2022-07-02 MED ORDER — BD PEN NEEDLE NANO U/F 32G X 4 MM MISC: 1 refills | Status: DC

## 2022-07-08 ENCOUNTER — Telehealth: Payer: Self-pay | Admitting: Psychiatry

## 2022-07-08 NOTE — Telephone Encounter (Signed)
Usually takes about 7-10 days for mania to improve after the dose increase.

## 2022-07-08 NOTE — Telephone Encounter (Signed)
Pt informed

## 2022-07-08 NOTE — Telephone Encounter (Signed)
Pt LVM @ 9:59a.  He said Dr Jennelle Human told him to take 1/4 extra Latuda if he was feeling mania.  He said he has done that for 3 days now and he wants to know how much longer he need to do that to help with the mania.  Next appt 11/30

## 2022-07-08 NOTE — Telephone Encounter (Signed)
Pt stated adding the extra 1/4 tab has helped a lot but he is still having some mania.He wants to know how much longer it will take for it to fully resolve

## 2022-07-15 ENCOUNTER — Encounter: Payer: Self-pay | Admitting: Internal Medicine

## 2022-07-15 ENCOUNTER — Ambulatory Visit: Payer: 59 | Admitting: Internal Medicine

## 2022-07-15 VITALS — BP 120/74 | HR 79 | Ht 69.0 in | Wt 230.6 lb

## 2022-07-15 DIAGNOSIS — E061 Subacute thyroiditis: Secondary | ICD-10-CM

## 2022-07-15 DIAGNOSIS — E782 Mixed hyperlipidemia: Secondary | ICD-10-CM | POA: Diagnosis not present

## 2022-07-15 DIAGNOSIS — E669 Obesity, unspecified: Secondary | ICD-10-CM

## 2022-07-15 DIAGNOSIS — E1165 Type 2 diabetes mellitus with hyperglycemia: Secondary | ICD-10-CM | POA: Diagnosis not present

## 2022-07-15 LAB — POCT GLYCOSYLATED HEMOGLOBIN (HGB A1C): Hemoglobin A1C: 7.4 % — AB (ref 4.0–5.6)

## 2022-07-15 MED ORDER — NOVOLOG FLEXPEN 100 UNIT/ML ~~LOC~~ SOPN: 5.0000 [IU] | PEN_INJECTOR | Freq: Four times a day (QID) | SUBCUTANEOUS | 3 refills | Status: DC

## 2022-07-15 MED ORDER — EMPAGLIFLOZIN 25 MG PO TABS: 25.0000 mg | ORAL_TABLET | Freq: Every day | ORAL | 3 refills | Status: DC

## 2022-07-15 MED ORDER — LEVEMIR FLEXPEN 100 UNIT/ML ~~LOC~~ SOPN: 45.0000 [IU] | PEN_INJECTOR | Freq: Every day | SUBCUTANEOUS | 3 refills | Status: DC

## 2022-07-15 NOTE — Patient Instructions (Addendum)
Please continue: - Metformin ER 2000 mg at dinnertime - Novolog before meals: 5-20 units 3x a day before meals - Semglee/Levemir 45 units at bedtime  Increase: - Jardiance 25 mg daily in am  Please return in 3-4 months with your sugar log.

## 2022-07-15 NOTE — Progress Notes (Signed)
Patient ID: Cody Short, male   DOB: May 04, 1976, 46 y.o.   MRN: [id number]  HPI: Cody Short is a 46 y.o.-year-old male, presenting for follow-up for DM2, dx in ~2010, insulin-dependent, uncontrolled, without long term complications. Last visit 5 months ago.  Interim history: He continues to have increased urination, but no blurry vision, nausea, chest pain. 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 He had a sleep study >> severe OSA >> started C PAP >> feels much better. Now he plans to restart exercise.  DM2: Patient's diabetes is directly correlated with his bipolar disease treatment: In the past he had frequent urination resolved after stopping lithium.  While transitioning off lithium, his Saphris dose was increased the blood sugars started to increase afterwards.    He was then taken off Saphris and started on Loxitane.  This was making him sleepier, but was not increasing his blood sugars.  In fact, sugars started to improve and he was able to reduce his insulin doses.   She had to stop Loxitane 2/2 increased BP and pulse.  In summer 2021, he switched to [place] >> sugars improved significantly on this.  In fact, he tells me that he was able to come off insulin completely while on this.  However, this did not work for him -was having anger outbursts on it.   He started on Latuda >> feeling better on this. However, on this, he was eating more >> sugars increased.  Also, he gained weight. He continues on this.  He was on an appetite suppressant (Topamax). This was causing nausea >> stopped.   Reviewed HbA1c levels: Lab Results  Component Value Date   HGBA1C 7.4 (A) 03/06/2022   HGBA1C 9.1 (A) 10/10/2021   HGBA1C 7.4 (A) 05/14/2021  05/07/2021: HbA1c 9.3% 12/22/2018: HbA1c 7.2%  Pt is on a regimen of: - Metformin ER 2000 mg at dinnertime - Jardiance 10 mg daily in am - Novolog before  meals:28-35 units 2-3x >> 35-45 >> 5-20 units 3x a day before meals - Lantus 15 >> 0 >> 25 >> 40 >> 60-75 >> 60 >> Semglee 45 units at bedtime He could not tolerate Ozempic >> nausea, AP We had to stop Jardiance due to increased urination. He was previously on Lantus but stopped since sugars improved. He tried Glipizide >> hypoglycemia in the 40s repeatedly. Lowest: 25. Also, Glipizide 2.5 mg in am >> nausea, vomiting, constipation We tried Invokana >> bothersome urination (when he was working) >> had to stop. He had pancreatitis 2/2 Depakote and HTG in the past. He stopped Actos b/c stomach pain. >>  We stopped Cycloset >> inefficient, could not use b/c price.  Pt checks his sugars 4 times a day per review of his log: - am:  119-208, 268 >> 104-178, 181 >> 105-156, 200, 242 - 2h after b'fast: 100-166, 244 >> 101 >> 162, 173, 325 >> n/c  - lunch:  172-259 >> 175-260, 343 >> 67, 85-120 >> 85-143, 154 - 2h after lunch: 140-262, 310 >> 110-202, 290 >> 143, 144 >> n/c - before dinner: 97, 106-291, 330 >> 64-176, 196 >> 81-144, 218, 224 - 2h after dinner: 140-278, 340, 354 >> 76, 80, 160-201, 267 >> 89-174, 232 - bedtime:  119-207 >> 80-160, 190 >> 107-180 >> n/c - nighttime: 89-181 >> see above >> 134 >> 83, 95 >> n/c Lowest sugar was 69 >> 90  >> 71 >> 81;  he has hypoglycemia awareness in the 70s. Highest sugar was 337 >> 354 >> 232 >> 242.  Glucometer: FreeStyle Lite (he likes this)  No history of CKD, last BUN/creatinine:  Lab Results  Component Value Date   BUN 12 05/17/2021   CREATININE 1.10 05/17/2021   He had MAU in the past, improved at last check: Lab Results  Component Value Date   MICRALBCREAT 35.2 (H) 05/10/2020   MICRALBCREAT 26.7 04/02/2018   MICRALBCREAT 3.7 02/10/2017   MICRALBCREAT 27.3 12/17/2015   MICRALBCREAT 4.0 02/22/2015  12/22/2018: 262 On Diovan.  He has mixed hyperlipidemia: Lab Results  Component Value Date   CHOL 225 (H) 02/10/2022   HDL 37 (L)  02/10/2022   LDLCALC 145 (H) 02/10/2022   LDLDIRECT 120.0 04/02/2018   TRIG 237 (H) 02/10/2022   CHOLHDL 4.6 08/06/2020  05/03/2020: 225/345/38/126 11/23/2019: 109/148/34/49 12/22/2018: 220/359/41/145 On fenofibrate 145 >> 160.   On Zetia.  He tried Repatha.  Rosuvastatin caused leg cramps, reportedly elevated CK (900s).  Most recent CK was 562.  - last eye exam was in 2023: No DR. Decreased vision - depth.  -+ Numbness but no tingling in his feet. Last foot exam 02/2022.  He developed chest tightness 05/2021 and saw Dr. Einar Gip.  He had CAC score of 0 (07/11/2021). He has a history of scrotal rash-improved on Nystatin + Triamcinolone.  He may need refills in the future. He has recurrent groin furunculosis. 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  Subclinical thyrotoxicosis: TSH levels were reviewed:  Lab Results  Component Value Date   TSH 1.11 10/10/2021   TSH <0.01 (L) 01/18/2021   TSH <0.01 (L) 12/11/2020   TSH 0.02 (L) 09/14/2020   TSH 4.03 12/17/2015   TSH 2.08 01/03/2014   TSH 5.15 11/20/2011   TSH 2.15 07/04/2010   TSH 3.82 12/12/2009   TSH 1.81 10/29/2007   FREET4 0.76 10/10/2021   FREET4 1.13 01/18/2021   FREET4 1.56 12/11/2020   FREET4 1.41 09/14/2020   T3FREE 3.7 10/10/2021   T3FREE 3.5 01/18/2021   T3FREE 4.1 12/11/2020   T3FREE 3.9 09/14/2020  05/07/2021: TSH 1.21 08/06/2020: TSH 0.079 05/03/2020: TSH 0.528 12/22/2018: 3.648  We checked a thyroid uptake and scan (01/02/2021) and this was consistent with thyroiditis: The thyroid scan is unremarkable. No hot or cold thyroid nodules are identified. 4 hour I-123 uptake = 2.7% (normal 5-20%) 24 hour I-123 uptake = 5.4% (normal 10-30%)   IMPRESSION: Low 4 hour and 24 hour or I 123 uptake may suggest thyroiditis.   Pt denies: - feeling nodules in neck - hoarseness - dysphagia - choking  He had problems with muscle cramps/abdominal pain and lipase was found to be elevated again, at 67 (11-51) in  04/2021.  At that time, he also had transaminitis and an increase CK at 562 (49-347).    ROS:+ see HPI  I reviewed pt's medications, allergies, PMH, social hx, family hx, and changes were documented in the history of present illness. Otherwise, unchanged from my initial visit note.  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 Recurs if cannot walk for a week.  TOBACCO ABUSE, HX OF 07/31/2009   Past Surgical History:  Procedure Laterality Date   pilar cystectomy     History   Social History   Marital Status: Single    Spouse Name: N/A   Number of Children: 0   Occupational History      Social History Main Topics   Smoking status: Former Smoker - quit in 2010   Smokeless tobacco: Never Used   Alcohol Use: No     Comment: none at all   Drug Use: No   Current Outpatient Medications  Medication Sig Dispense Refill   Accu-Chek Softclix Lancets lancets Use as instructed to check blood sugar 4 times daily. DX:E11.65 300 each 3   Adapalene-Benzoyl Peroxide 0.1-2.5 % gel SMARTSIG:1 Application Topical Every Evening     Blood Glucose Monitoring Suppl (CONTOUR NEXT MONITOR) w/Device KIT Check sugar 4 times daily 1 kit 0   Blood Glucose Monitoring Suppl (FREESTYLE LITE) DEVI Use as instructed to check sugar 4 times daily 1 each 0   Docusate Sodium (COLACE PO) Take 2 tablets by mouth daily.     empagliflozin (JARDIANCE) 10 MG TABS tablet Take 1 tablet (10 mg total) by mouth daily. 90 tablet 1   ezetimibe (ZETIA) 10 MG tablet TAKE 1 TABLET (10 MG TOTAL) BY MOUTH DAILY AFTER SUPPER. 90 tablet 0   famotidine (PEPCID) 40 MG tablet Take 40 mg by mouth 2 (two) times daily.      fenofibrate (TRICOR) 145 MG tablet TAKE 1 TABLET BY MOUTH EVERY DAY 90 tablet 0   glucose blood (ACCU-CHEK GUIDE) test strip Use as instructed to check blood sugar 4 times daily. DX:E11.65 300 each 3   hydrOXYzine (ATARAX) 10 MG tablet TAKE 1 TABLET BY MOUTH THREE TIMES A DAY AS NEEDED (Patient taking differently: Take 10 mg by mouth at bedtime.) 270 tablet 0   insulin aspart (NOVOLOG FLEXPEN) 100 UNIT/ML FlexPen Inject 35-45 Units into the skin in the morning, at noon, in the evening, and at bedtime. (Patient taking differently: Inject 5-20 Units into the skin in the morning, at noon, in the evening, and at bedtime.) 45 mL 11   insulin glargine-yfgn (SEMGLEE) 100 UNIT/ML Pen Inject 60 Units into the skin at bedtime. 45 mL 3   Insulin Pen Needle (BD PEN NEEDLE NANO U/F) 32G X 4 MM MISC USE 4 TIMES DAILY WITH INSULIN PENS 400 each 1   lurasidone (LATUDA) 80 MG TABS tablet Take 1 tablet (80 mg total) by mouth daily with breakfast. 90 tablet 0   metFORMIN (GLUCOPHAGE-XR) 500 MG 24 hr tablet TAKE 4 TABLETS (2,000 MG TOTAL) BY MOUTH DAILY WITH SUPPER. 360 tablet 0   valsartan (DIOVAN) 160 MG tablet Take 160 mg by mouth daily.     No current facility-administered medications for this visit.    No current facility-administered medications on file prior to visit.    Allergies  Allergen Reactions   Tricor [Fenofibrate] Other (See Comments)    Myopathy   Divalproex Sodium Other (See Comments)    unknown   Methylphenidate Hcl     Aggravate bipolar   Oxycodone Hcl     REACTION: hallucinations   Ozempic (0.25 Or 0.5 Mg-Dose) [Semaglutide(0.25 Or 0.52m-Dos)] Nausea Only   Paroxetine     Aggravate bipolar disorder   Rosuvastatin Other (See Comments)    Myopathy -muscle cramps, elevated CK   Family History  Problem Relation Age of Onset   Diabetes Mother    Breast cancer Mother  was told not hereditary   Cirrhosis Mother        fatty liver   High blood pressure Mother    High blood  pressure Father    Diabetes Father    Melanoma Maternal Uncle    Colon cancer Neg Hx    Stomach cancer Neg Hx    Pancreatitis Neg Hx    Heart disease Neg Hx    Kidney disease Neg Hx    Liver disease Neg Hx    PE: BP 120/74 (BP Location: Right Arm, Patient Position: Sitting, Cuff Size: Normal)   Pulse 79   Ht 5' 9"  (1.753 m)   Wt 230 lb 9.6 oz (104.6 kg)   SpO2 97%   BMI 34.05 kg/m   Wt Readings from Last 3 Encounters:  07/15/22 230 lb 9.6 oz (104.6 kg)  03/06/22 231 lb (104.8 kg)  02/20/22 234 lb 12.8 oz (106.5 kg)   Constitutional: overweight, in NAD Eyes: EOMI, no exophthalmos ENT:no thyromegaly, no cervical lymphadenopathy Cardiovascular: RRR, No MRG Respiratory: CTA B Musculoskeletal: no deformities Skin: moist, warm, no rashes Neurological: no tremor with outstretched hands   ASSESSMENT: 1. DM2, insulin-dependent, uncontrolled, without long term complications, but with hyperglycemia  His test were negative for type 1 diabetes: Component     Latest Ref Rng 05/28/2015  C-Peptide     0.80 - 3.90 ng/mL 2.88  Glucose, Fasting     65 - 99 mg/dL 120 (H)  Glutamic Acid Decarb Ab     <5 IU/mL <5  Pancreatic Islet Cell Antibody     < 5 JDF Units <5   - His diabetes is difficult to manage because of multiple intolerances: - We tried to add Invokana but he did not tolerated due to increased urination.  - We cannot use a DPP 4 inhibitor or a GLP-1 receptor agonist due to his history of pancreatitis.  - He had to stop Actos >>  abd. Pain - we stopped Cycloset >> expensive and not effective - we tried Glipizide >> GI sxs: N/V/D - he tried the Dexcom G6 CGM but he felt that this was giving him a lot of errors and stopped  2. HTG  3.  Obesity class I  4.  Subacute thyroiditis  PLAN:  1. Patient with oral antidiabetic regimen with metformin and SGLT2 inhibitor and also basal/bolus insulin regimen.  His sugars started to improve before last visit due to lows adding the  SGLT2 inhibitor and also starting a more active job, during the day.  He continues with the same schedule now.  At last visit HbA1c was 7.4%, improved. -Sugars appear to be mostly at goal but he does have hyperglycemic spikes usually after dietary indiscretions.  Particularly, he is drinking a [place] black currant syrup, Rybena, which we discussed he needs to stop. -I also recommended to try to increase his Jardiance to the target dose of 25 mg daily. -Per insurance preference, we need to switch from [facility] to Levemir.  I sent a prescription for this for him today. - I advised him to: Patient Instructions  Please continue: - Metformin ER 2000 mg at dinnertime - Novolog before meals: 5-20 units 3x a day before meals - Semglee/Levemir 45 units at bedtime  Increase: - Jardiance 25 mg daily in am  Please return in 3-4 months with your sugar log.   - we checked his HbA1c: 7.4% (stable) - advised to check sugars at different times of the day - 3-4x a day, rotating  check times - advised for yearly eye exams >> he is UTD - return to clinic in 4 months  2. HL -Predominant hypertriglyceridemia -Since last visit, she had another lipid panel with PCP.  He mentions that his triglycerides were still elevated.  His fenofibrate was increased.  I will try to obtain these records. -Reviewed latest lipid panel available for review - from 01/2022: All fractions abnormal: Lab Results  Component Value Date   CHOL 225 (H) 02/10/2022   HDL 37 (L) 02/10/2022   LDLCALC 145 (H) 02/10/2022   LDLDIRECT 120.0 04/02/2018   TRIG 237 (H) 02/10/2022   CHOLHDL 4.6 08/06/2020  -On fenofibrate 160 mg daily and Zetia 10 mg daily -he had mm cramps + and elevated CK >> normalized.  Muscle cramps improved.  3.  Obesity class I -He gained a significant amount of weight on Latuda -He was not able to start Ozempic due to history of pancreatitis.  We did discuss in the past about possibly trying on a low dose and  watching him closely but he was reticent to start. -Currently on Jardiance which should also help with weight loss -Not exercising now but plans to start now that he is feeling better, more energetic after starting a CPAP for newly diagnosed OSA -Unfortunately, he needs to get a snack with 350 cal at bedtime when he takes [place]. -He lost 1 pound since last visit  4.  Subacute thyroiditis -Diagnosed by thyroid uptake and scan -Resolved, TFTs were normal at last check -No signs or symptoms of thyrotoxicosis -he had labs checked by PCP recently.  We will try to obtain the results  Philemon Kingdom, MD PhD [facility]

## 2022-07-16 ENCOUNTER — Encounter: Payer: Self-pay | Admitting: Family Medicine

## 2022-07-17 ENCOUNTER — Encounter: Payer: Self-pay | Admitting: Internal Medicine

## 2022-07-21 ENCOUNTER — Other Ambulatory Visit: Payer: Self-pay | Admitting: Internal Medicine

## 2022-07-21 DIAGNOSIS — E1165 Type 2 diabetes mellitus with hyperglycemia: Secondary | ICD-10-CM

## 2022-07-21 MED ORDER — METFORMIN HCL ER 500 MG PO TB24: 1000.0000 mg | ORAL_TABLET | Freq: Two times a day (BID) | ORAL | 3 refills | Status: DC

## 2022-07-27 ENCOUNTER — Other Ambulatory Visit: Payer: Self-pay | Admitting: Psychiatry

## 2022-07-27 DIAGNOSIS — F411 Generalized anxiety disorder: Secondary | ICD-10-CM

## 2022-07-27 DIAGNOSIS — F4001 Agoraphobia with panic disorder: Secondary | ICD-10-CM

## 2022-07-27 DIAGNOSIS — F3110 Bipolar disorder, current episode manic without psychotic features, unspecified: Secondary | ICD-10-CM

## 2022-07-28 ENCOUNTER — Telehealth (INDEPENDENT_AMBULATORY_CARE_PROVIDER_SITE_OTHER): Payer: 59 | Admitting: Mental Health

## 2022-07-28 ENCOUNTER — Telehealth: Payer: Self-pay | Admitting: Neurology

## 2022-07-28 DIAGNOSIS — F3110 Bipolar disorder, current episode manic without psychotic features, unspecified: Secondary | ICD-10-CM | POA: Diagnosis not present

## 2022-07-28 DIAGNOSIS — R69 Illness, unspecified: Secondary | ICD-10-CM | POA: Diagnosis not present

## 2022-07-28 NOTE — Telephone Encounter (Signed)
LVM and sent mychart msg informing pt of need to reschedule 11/22 appointment - MD out 
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
## 2022-07-30 ENCOUNTER — Telehealth: Payer: Self-pay | Admitting: Psychiatry
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
## 2022-07-30 NOTE — Telephone Encounter (Signed)
Increase Latuda to 120 mg tablet 1 daily for manic symptoms.  Send in new prescription for 1 month supply with no refills.

## 2022-07-30 NOTE — Telephone Encounter (Signed)
Pt lvm that he is still manic ad doesn't know if he needs to increase medicine or maybe the generic doesn't work for him. Please call him back at [phone]

## 2022-07-31 ENCOUNTER — Other Ambulatory Visit: Payer: Self-pay

## 2022-07-31 ENCOUNTER — Telehealth: Payer: Self-pay | Admitting: Psychiatry

## 2022-07-31 DIAGNOSIS — F411 Generalized anxiety disorder: Secondary | ICD-10-CM

## 2022-07-31 DIAGNOSIS — F3110 Bipolar disorder, current episode manic without psychotic features, unspecified: Secondary | ICD-10-CM

## 2022-07-31 DIAGNOSIS — F4001 Agoraphobia with panic disorder: Secondary | ICD-10-CM

## 2022-07-31 MED ORDER — LURASIDONE HCL 120 MG PO TABS: 120.0000 mg | ORAL_TABLET | Freq: Every day | ORAL | 0 refills | Status: DC

## 2022-07-31 NOTE — Telephone Encounter (Signed)
[facility] just called and said that the Lurasidone 120 mg needs to be called in to [facility] and not [facility] [address]. He is requesting #90 pills also.   [facility] [place], [place] to [address] [place] [address]  Phone:  [phone]  Fax:  [phone]

## 2022-07-31 NOTE — Telephone Encounter (Signed)
Rx sent 

## 2022-07-31 NOTE — Telephone Encounter (Signed)
LVM rx sent 

## 2022-09-09 ENCOUNTER — Encounter: Payer: Self-pay | Admitting: Internal Medicine

## 2022-09-09 DIAGNOSIS — E1165 Type 2 diabetes mellitus with hyperglycemia: Secondary | ICD-10-CM

## 2022-09-09 MED ORDER — LEVEMIR FLEXPEN 100 UNIT/ML ~~LOC~~ SOPN: 30.0000 [IU] | PEN_INJECTOR | Freq: Every day | SUBCUTANEOUS | 2 refills | Status: DC

## 2022-09-10 ENCOUNTER — Ambulatory Visit (INDEPENDENT_AMBULATORY_CARE_PROVIDER_SITE_OTHER): Payer: 59 | Admitting: Mental Health

## 2022-09-10 DIAGNOSIS — R69 Illness, unspecified: Secondary | ICD-10-CM | POA: Diagnosis not present

## 2022-09-10 DIAGNOSIS — F3110 Bipolar disorder, current episode manic without psychotic features, unspecified: Secondary | ICD-10-CM

## 2022-09-10 NOTE — Progress Notes (Addendum)
[facility]  Name: Cody Short Date: 09/10/2022 MRN: [id number] DOB: October 28, 1975 PCP: Lewis Moccasin, MD  Time spent:  53 minutes  Treatment:   ind. Therapy  Virtual Visit via Telehealth Note Connected with patient by a telemedicine/telehealth application via video, with their informed consent, and verified patient privacy and that I am speaking with the correct person using two identifiers. I discussed the limitations, risks, security and privacy concerns of performing psychotherapy and the availability of in person appointments. I also discussed with the patient that there may be a patient responsible charge related to this service. The patient expressed understanding and agreed to proceed. I discussed the treatment planning with the patient. The patient was provided an opportunity to ask questions and all were answered. The patient agreed with the plan and demonstrated an understanding of the instructions. The patient was advised to call  our office if  symptoms worsen or feel they are in a crisis state and need immediate contact.   Therapist Location: office Patient Location: home     Mental Status Exam:    Appearance:    Casual     Behavior:   Appropriate  Motor:   WNL  Speech/Language:    Clear and Coherent  Affect:   Full range   Mood:   Euthymic, anxious  Thought process:   Logical, linear, goal directed  Thought content:     WNL  Sensory/Perceptual disturbances:     none  Orientation:   x4  Attention:   Good  Concentration:   Good  Memory:   Intact  Fund of knowledge:    Consistent with age and development  Insight:     Good  Judgment:    Good  Impulse Control:   Good     Reported Symptoms:  anxiety, rumination, intermittent depressed mood, impulsivity  Risk Assessment: Danger to Self:  No Self-injurious Behavior: No Danger to Others: No Duty to Warn:no Physical Aggression / Violence:No  Access to Firearms a concern: No  Gang  Involvement:No  Patient / guardian was educated about steps to take if suicide or homicide risk level increases between visits: yes While future psychiatric events cannot be accurately predicted, the patient does not currently require acute inpatient psychiatric care and does not currently meet [facility] involuntary commitment criteria.  Subjective:  Patient engaged in telehealth session via video.  Patient shared recent events.  He stated that he has worked to move on from a girl that he has some interested romantically, following through on some content discussed, working to reframe thoughts.  He is has been helpful and he feels he is moving on from having feelings for her.  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  How to manage frustrations, feelings related to when customers can be rude and disrespectful was explored as he stated this can be distressing.  He shared how he handles these situations appropriately however, he continues to feel upset after.  We explore ways to cope, facilitating his identifying thoughts to remind himself of to manage in these situations.  He plans to continue to follow through on being mindful of  these ideas and thoughts that work toward avoiding any self blame or self-critical talk.   Interventions:  motivational interviewing, CBT    Diagnoses:    ICD-10-CM   1. Bipolar I disorder, most recent episode (or current) manic (HCC)  F31.10            Plan: Patient to utilize coping skills as discussed, continue his medication compliance to maintain mood stability.  Long-term goals:   Maintain symptom reduction: The patient will  report sustained reduction in symptoms of anxiety using both CBT and mindfulness interventions for 3 consecutive months progressively.  Improve emotional regulation: The patient will learn and apply CBT and mindfulness-based strategies to regulate emotions, such as mindfulness-based stress reduction and cognitive restructuring, and report an improvement in emotional regulation for at least 3 consecutive months progressively.    Short-term goal:  The patient will learn and apply CBT and mindfulness-based coping skills for managing anxiety and practice using it between sessions.       2.   The patient will CBT and mindfulness-based interventions to increase awareness of negative thought patterns. 3.   The patient will keep and maintain employment to keep financial stress manageable       4.   The patient will decrease anxiety specifically when driving.         Waldron Session, [facility]

## 2022-09-15 ENCOUNTER — Encounter: Payer: Self-pay | Admitting: Internal Medicine

## 2022-09-15 ENCOUNTER — Other Ambulatory Visit: Payer: Self-pay | Admitting: Psychiatry

## 2022-09-15 DIAGNOSIS — F4001 Agoraphobia with panic disorder: Secondary | ICD-10-CM

## 2022-09-15 DIAGNOSIS — F3110 Bipolar disorder, current episode manic without psychotic features, unspecified: Secondary | ICD-10-CM

## 2022-09-15 DIAGNOSIS — I10 Essential (primary) hypertension: Secondary | ICD-10-CM | POA: Diagnosis not present

## 2022-09-15 DIAGNOSIS — E1165 Type 2 diabetes mellitus with hyperglycemia: Secondary | ICD-10-CM

## 2022-09-15 DIAGNOSIS — F411 Generalized anxiety disorder: Secondary | ICD-10-CM

## 2022-09-15 DIAGNOSIS — E782 Mixed hyperlipidemia: Secondary | ICD-10-CM | POA: Diagnosis not present

## 2022-09-15 DIAGNOSIS — G4733 Obstructive sleep apnea (adult) (pediatric): Secondary | ICD-10-CM | POA: Diagnosis not present

## 2022-09-15 DIAGNOSIS — L709 Acne, unspecified: Secondary | ICD-10-CM | POA: Diagnosis not present

## 2022-09-15 MED ORDER — LEVEMIR FLEXPEN 100 UNIT/ML ~~LOC~~ SOPN: 30.0000 [IU] | PEN_INJECTOR | Freq: Every day | SUBCUTANEOUS | 2 refills | Status: DC

## 2022-09-17 ENCOUNTER — Ambulatory Visit: Payer: 59 | Admitting: Neurology

## 2022-09-25 ENCOUNTER — Telehealth (INDEPENDENT_AMBULATORY_CARE_PROVIDER_SITE_OTHER): Payer: 59 | Admitting: Psychiatry

## 2022-09-25 ENCOUNTER — Encounter: Payer: Self-pay | Admitting: Psychiatry

## 2022-09-25 DIAGNOSIS — F4001 Agoraphobia with panic disorder: Secondary | ICD-10-CM | POA: Diagnosis not present

## 2022-09-25 DIAGNOSIS — F1211 Cannabis abuse, in remission: Secondary | ICD-10-CM

## 2022-09-25 DIAGNOSIS — F5105 Insomnia due to other mental disorder: Secondary | ICD-10-CM | POA: Diagnosis not present

## 2022-09-25 DIAGNOSIS — F3110 Bipolar disorder, current episode manic without psychotic features, unspecified: Secondary | ICD-10-CM

## 2022-09-25 DIAGNOSIS — F411 Generalized anxiety disorder: Secondary | ICD-10-CM

## 2022-09-25 DIAGNOSIS — R69 Illness, unspecified: Secondary | ICD-10-CM | POA: Diagnosis not present

## 2022-09-25 MED ORDER — LURASIDONE HCL 120 MG PO TABS: 120.0000 mg | ORAL_TABLET | Freq: Every day | ORAL | 1 refills | Status: DC

## 2022-09-25 NOTE — Progress Notes (Signed)
Cody Short [id number] August 30, 1976 46 y.o.   Video Visit via My Chart  I connected with pt by video using My Chart and verified that I am speaking with the correct person using two identifiers.   I discussed the limitations, risks, security and privacy concerns of performing an evaluation and management service by My Chart  and the availability of in person appointments. I also discussed with the patient that there may be a patient responsible charge related to this service. The patient expressed understanding and agreed to proceed.  I discussed the assessment and treatment plan with the patient. The patient was provided an opportunity to ask questions and all were answered. The patient agreed with the plan and demonstrated an understanding of the instructions.   The patient was advised to call back or seek an in-person evaluation if the symptoms worsen or if the condition fails to improve as anticipated.  I provided 30 minutes of video time during this encounter.  The patient was located at home and the provider was located office.  Session from  [phone]   Subjective:   Patient ID:  Cody Short is a 46 y.o. (DOB Feb 12, 1976) male.  Chief Complaint:  Chief Complaint  Patient presents with   Follow-up    Bipolar I disorder, most recent episode (or current) manic ([name])   Anxiety     Cody Short presents to the office today for follow-up of several psychiatric diagnoses ...    seen August 2020.  For obsessive anxiety  Switched Lexapro 5 mg daily on May 18.  But he stated it made him hungry and so it was stopped in August.  He continued on Saphris 10 mg nightly, lithium 1200 mg daily, propranolol as needed tremor.  Lithium level was ordered.  He called Sept saying he was manic.  Had a lithium level 0.7 and so dose was increased to 1500 mg daily.    seen December 21, 2019.  He had some ongoing manic symptoms and Saphris was increased to 15 mg  nightly.  02/07/2020 appointment with the following noted: He has made multiple phone calls to the office since that date.  He decided he wanted to stop lithium because of urinary problems.  He was warned this could lead to mania but he wanted to pursue it anyway.  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  Cody Short had been having bedwetting episodes which she attributed to the lithium.  He has not consistently taken Saphris 15 mg at bedtime since the office visit in February. He completely stopped lithium January 30, 2020 U frequency is much better and less thirsty.  Bedwetting resolved.  Reduced BM to 1-2 times daily.  Gassy.  Glucose is high.  B is hospitalized with unknown disease.  Insulin adjusted. Mental status is best it's ever been.  Less mania.  Less obsessing.  No worsening mania. Is taking Saphris 15 mg HS> Plan no med changes.  04/10/2020 appointment with the following noted: No longer on lithium.  Still frequent urination and thirst DT DM poorly controlled despite meds.  Endo says little else to take bc history of pancreatitis.  Says he goes to urinate q 15 mins. Plan: Wean Saphris in crossover to loxapine 150 mg HS over 2 weeks bc he and endocrinologist believe it's likely Saphris is cause of the inability to get his glucose to acceptable levels.   06/07/20 appt with the following noted: He's called a  couple of times CO sleepiness and reduced loxapine to 50 mg HS. Started Vegan diet and BS is better 4 weeks ago.  Thinks glucose is better off the Saphris. Lost 6 #.   CO sleepiness and sleep 16 hours   He thinks it also drove up pulse and blood pressure. Asks about taking Saphris 10 and a second medication. Mood has been fine.   Plan: He wants to restart Saphris 10 mg HS and use a 2nd mood stabilizer. Start Saphris and reduce Loxapine  To 10 mg at night DT prior failure of 10 Mg Saphris with lithium.  06/27/2020 phone call from patient: Pt  called to report Loxapine & Saphris is causing his BP to be very high and Saphris is causing blood sugar to be high. Apt 10/18. Need changes before then. Call back @ [phone]  Cody Short: He had wanted to go back on Saphris and reduce loxapine which we did.  Tell him to stop the Saphris and increase loxapine to 20 mg nightly.  He did not tolerate 50 mg of loxapine very well. Nurse TC withpt: Spoke with Cody Short and he will stop the Saphris, but feels the increase in the Loxapine to 20 mg will increase his blood sugars more. Explained that with coming off the [name] he needs to increase it a bit or he will not be stable with his moods. Advised him to stop the Saphris and would check with Cody Short about any other recommendations.   07/17/20 appt with the following noted: Doing terrible.  Needs melatonin to sleep.  Claims loxapine is elevating BP, pulse, TG, cholesterol, glucose.  Claimed Saphris did it too.   Sees PCP at end of October.   Started Vegan diet about 6 weeks ago and exercising.   A/P: There are no available mood stabilizers that do not have a warning of elevating blood sugar and cholesterol.  He has tried all the mood stabilizers that do not have this warning.  All of the available mood stabilizers left that have any effectiveness are antipsychotics and the FDA has required a class warning on all antipsychotics about blood sugar and cholesterol effects.  This is true despite the fact that not all antipsychotics are equal at increasing these risks.  The patient's reading of these potential side effects is complicating finding an effective treatment.  The only way to prove whether or not these meds are elevating his blood sugar and cholesterol is to stop the medications for a period of time.  This exposes him to manic risk but there is no chance of finding an adequate mood stabilizer that will satisfy him until we can prove whether or not these meds are in fact affecting his blood sugar,  cholesterol, blood pressure, and pulse. Therefore he will stop the loxapine which is at a low dose. We will check labs in 3 weeks.  We will attempt follow-up in 4 weeks.  If he gets manic he will let us know.   08/13/2020 appointment with the following noted: Blood sugar is pretty much the same off the loxapine but BP and pulse improved from 110 to 70 range.  But had started BP med about mid September. Started counselor Sonia Side at [facility] and started nutritionsist.  Has changed and restricted diet. Sees PCP 07/24/20.  Endo is managing DM.   Mood has been really good.  Counseling helped the anxiety.  Getting 10 hours of sleep.  PCP suggested changing time going to bed earlier and  it's helping.   Sometimes has racing thoughts and gets comments from others while playing video games that he needs to take a break. Patient reports stable mood and deniesr irritable moods.  Denies appetite disturbance.  Patient reports that energy and motivation have been good.  Patient denies any difficulty with concentration.  Patient denies any suicidal ideation.  Admits he's easily stressed.   10/09/20 appt with following noted: Getting manic as hell and depressed as hell. Talking too much and obsessing over things.  Talking too loud and doing things extremely fast.  Only depressed a couple of days ago.  Manic sx are brief and not all the time. Sleep 7-8 hours and sleep schedule is more normal. Stayed on Caplyta and pleased overall with SE. BS is better but hypertension is ongoing. Low TSH. Still counseling. No delusions hallucinations since off drugs. Give samples of Caplyta 42 mg once daily. Prefer no change befor ethe holidays bc he is not continuously manic or depressed. Likely switch to [place] after the holidays.  11/19/2020 appointment with the following noted: Best I've ever been except some controllable manic.  Able to do things fast and easily.  Anxiety medicine and counseling  has helped.  Better function.  No SE. Not depressed in a while.  But not sure the Capylta helps mania.   Lost from 230# to 189# with diet and exercise over a long period.   Sleep 4-8 hours, but not that I can't sleep but don't feel like sleeping.  Not itching any more.  I'm a a whole new person.   I can't keep up the mania.  Driving my parents and friends crazy. Blood sugar better and off insulin.  Only taking metformin. Can't afford Caplyta. So feels he must change. Hydroxyzine really helped anxiety. Plan: DC Caplyta DT mania.  switch to Latuda 40 then 80 mg with food.  Pick up samples.  12/03/2020 phone call from parents:Parents called and want Cody Short to know that Cody Short is maniac and he is having physical aggression and all he does is play video games 24 hours a day and is not sleeping. Parents feel like Cody Short is not giving you the whole picture and needs an adjustment to his medicine. Cody Short:atient acknowledged that his appointment on January 24 that he was manic while taking Caplyta.  He also acknowledged that his family saw him as being manic.  We discontinued Caplyta and switched him to [place].  If he made the switch and increase the dose in a timely manner he should have been on 80 mg daily for a week now.  His parents are complaining that he is still manic.  Provided he is tolerating the Latuda reasonably well have him increase to 120 mg daily.  He can take 1-1/2 of the 80 mg tablets until he runs out and then we can send in a refill for 120 mg daily.  Remind him he needs to take that with at least 350 cal. Phone call with patient from nurse: Patient rtc and he did report he's doing great on the medication but is still having mania. Reports staying up all night playing video games then asleep about 5 am and back up at noon. He is taking medication with 350 calories also. Advised him to increase to 120 mg Latuda daily. He reports having only 40 mg tablets at home so instructed him to take 3  tablets and I would get more samples out for him to pick up tomorrow. He agreed  and verbalized understanding.  12/07/2020 phone call: Patient called again about his Latuda. He states that it makes him really hungry after he takes it, he takes a nap and wakes up hungry again. He would like to know would it be okay to take before bed instead. Cody Short: He is unlikely to comply with recommendations about routine sleep and wake times.  He is in a habit of playing video games late into the night.  If he prefers he can split the dosage of the Latuda between breakfast and another meal.  But it is very important that he get the full dose in every day and take it with 350 cal. Nursing phone call with patient:Rtc to patient and he reports he has to take it all at hs because it makes him sleepy, I advised him to continue that regimen. He also reports not sleeping well but he's been drinking caffeine later in the day. Advised him to not drink any caffeine past 2-3 pm. He agreed. He reports his blood sugars have been elevated some, not as bad but it is up some.   12/20/2020 appointment with the following noted: Mania tremendously better but occ anger outbursts anger.  6 hours of sleep with melatonin, hydroxyzine and Latuda 120 mg for 2 weeks.  Staying up all night playing video games. Anette Guarneri does make him hungry right after he takes it so takes it before bedtime.  Taking with food. Hyperactivity and loud talking is better.  I can function fast.  Parents see benefit with mania but are concerned about his anger outbursts but he things she takes stress out on him.  Blood sugar is generally good except when eats too much.  Taking it with dinner and HS.   Able to drive around town.  Can cook and clean.  Less ruminating on his past.  Less need for direction.   Counseling is helping at [facility] counseling. Hydroxyzine helped itching and anxiety.  Plan: Continue Latuda 120 mg PM.  Is getting benefit but only been on it  for a couple of weeks.   01/17/2021 appointment with following noted: So so.  Some anger outburts a couple of times.  Eats too much.  Blood sugar is pretty bad.  Up at night playing video games and then sleeps too late in the day.  Takes Latuda at evening meal.  Wants to blame meds for these behaviors. Otherwise mood is "good I guess".  No serious highs or lows.  Can get in arguments with mother when she tries to correct him and may feel guilty afterwards. Takes hydroxyzine q 6 hours because of anxiety and bc dx thyroid inflamed, thyroiditis.  Asks if there's anything to help with overeating. Lost weight from thyroid.   Plan: Continue Latuda 120 mg as he is tolerating it and it appears to be controlling mania better than did the Caplyta and he is tolerating it better.  02/12/2021 appointment with the following noted: Perfectly good with mood and depression.  Needs hydroxyzine for itching and anxiety.  Energy poor with thyroiditis. Will sleep too much too bc gets bored and no physical activity. Varies 9-16 hours daily.  Can nap easily.  Not spacy.   No anger lately. Tolerating Latuda well.   Still eats too many sweets late at night and gets high blood sugars.   Plan: Increase  topiramate 50 mg BID off label for weight loss and disc SE.  He wants to try something.  03/20/2021 appointment with the following noted:  Taking meds.  A little benefit with increase topiramate. Mood is perfectly fine without mania nor depression. Blood sugar is a bit high. Claims doctor thinks rash could be related to [facility] but he sleeps all the time bc hypothyroidism. Has folliculitis Started treatment for it and it is helping.   Thyroid problem is a wait and see.  Could take a year to get better. Doesn't think topiramate added tiredness. Not taking hydroxyzine much about once daily. Dealing with difficult GM threatening sui if left alone.  Plan: Continue Latuda 120 mg PM.  Is getting benefit.. Better tolerated  than others so far but he does say it makes him hungry but he has chronic impulse control problems including overeating.  05/06/2021 appointment with the following noted: Quit topiramate DT nausea and not helping at all. Rash resolved. Lipids not terrible but a little up.  TSH normalized. HGBA1C 9.3 Not exercising. Not doing much re: work etc other than video games. Not manic lately. Patient reports stable mood and denies depressed or irritable moods.  Patient denies any recent difficulty with anxiety.   Denies appetite disturbance.  Patient reports that energy and motivation have been good.  Patient denies any difficulty with concentration.  Patient denies any suicidal ideation. Energy is better than it was but still sleeping 12 hours daily and up at night playing videos with friends. Plan: Reduce Latuda to 80 mg to reduce excessive sleepiness.  If mania then increase it back up 120 mg PM.  Is getting benefit..  06/20/21 TC:  Pt stated the cramps have spread to his arms and legs.He has been on latuda 80 mg qd for awhile now.He stated he is also going to see a cardiologist due to heart spasms. Cody Short:  The cramps are not likely related to [place].  Usually it is related to dehydration although I see from chart review that he had elevated creatinine kinase from a statin and that was probably causing some of his problem.  He needs to make sure he drinks lots of water.  Especially because he is diabetic and that will make him prone to dehydration if he is not controlling his blood sugar. However the sleepiness might be related to [place].  He had previously been on 120 mg daily and we reduced it to 80 mg daily and if he is done okay with his mood we can try reducing it 1 more time to 60 mg.  I will send in a prescription for the 60 mg tablet and he can reduce it if he would like to see if the sleepiness is related to [place].  If Anette Guarneri is contributing to sleepiness it will get better if he reduces the  dose.  If the sleepiness does not get better with the dosage reduction then Latuda is not the cause Reduce Latuda to 60 mg daily  07/30/2021 appt noted: Not much difference in alertness Mania since reducing Latuda. Hypoactive bc thryroid problems and may be the cause of the cramps per the other doctors. Itches badly without hydroxyzine. Spent $500 on gambling game and adeleted.  Past history of drug induced gambling but no major drugs in 15 years. Still exhausted and sleep all day.  But is taying up at night.  Sleeping 16 hours daily.  Wonders if hydroxyzine 25 is making him tired. Itching worse with stress and stress of GM and B crazy. Asks about bipolar shot. Side sleeper and as far as he knows no excessive snoring.  08/23/2021 phone call from  patient's father stating that Cody Short had "gone off the rails" and it spent $1000 on the night prior doing online gambling and had gone back to staying up all night and sleeping during the day.  He had also been more verbally abusive.  It was suggested that the Latuda dose be increased from 60 to 80 mg in the evening.  10/09/2021 appointment with the following noted: Sleeping a lot and easily exhausted even after med were changed.   SE a little constipation. History of problems with gambling in the past but not current.  Not thinking of it now. No other manic sx currently.   On Latuda 80 daily now.  Reduced hydroxyzine to 10 and still sleepy.  No other psych meds Plan: Continue Latuda 80 mg bc manic with less .  If recurrence of mania then increase it back up 120 mg PM.  Is getting benefit..  11/18/2021 phone call: Patient had complained of elevated CK levels and wondered if it could be related to [place].  Cody Short called Dr. Ernie Hew and discussed this issue.  It appears that his CK levels have actually been declining over the last several months and are not at a critical level.  It was agreed that this could be monitored over time and no immediate med  change was necessary.  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   1 episode when got angy and said awful things to mother but then apologized.  Only 1 bad day. Otherwise mood has been pretty stable lately. Constipation and can't take miralax bc gives him diarrhea.   Sleep normalized to 8 hours since started his job.  So the med was not the source of the sleepiness. Counselor next Tuesday Only hydroxyzine is 10 mg HS Only other psych med is Latuda 80 mg daily Plan: Latuda  Been the best med so far for him.     Sleepiness was not better with reduction in Latuda from 120 to 60 mg daily. Continue Latuda 80 mg bc manic with less .  Option split dose to help sleepiness .   03/12/22 appt noted: Taking hydroxyzine BID Sleep study next week. 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  At [employer]. Doing well emotionally. Constipation and GI doc tomorrow DT fissure. BS is better A1C 7.4 instead of 9+ Sleep 8 hours.   The only thing he doesn't like is 4 meals daily bc hard to lose weight. Not much mania nor depression. Still anxious and worse without hydroxyzine. Consistent with Latuda at bedtime with food.   Rash bettter .  Itching managed with BID hydroxyzine. Plan: Continue Latuda 80 mg bc manic with less .  Option split dose to help sleepiness .  If recurrence of mania then increase it back up 120 mg PM.  Is getting benefit..  06/23/22 appt noted: Rough 2 weeks.  [place] for 44 yo girl at work and was somewhat manic with her and she quit the job.  But is beginning to feel better now. Dx OSA CPAP and tremendous difference.  Not sleeping as much and more productive at work.  But still some  initial sleepiness in the AM Not constipation or diarrhea  but slow to move bowels but takes several times in the AM. Started Colace  2 daily for the last couple of days. Lost 10-15# and now stagnant. Working and eating less and weights off and on 3 weeks and trouble losing weight. At work asking too many questions and is too anxious.  Wonders about more hydroxyzine. Sober for 12 years. Can't split Latuda bc gets too sleepy and takes it too much at night. Had same job 6 mos.  Got employee of the month. Plan: no changes  07/08/22 tC CO more manic sx 07/30/22 TC: Pt stated generic latuda is not working for him.He is having manic symptoms such as cleaning his whole house,getting into arguments with his mom and getting agitated very easily.He also gambled $300.He is taking 80 mg  Cody Short : incr Latuda to 120 mg daily  09/25/22 appt noted: It made all the difference in the world with incr Latuda to 120 mg daily.  No sig mania or depression now. Increased stool softeners to 3 twice daily.  BS better with Jardiance and wt loss 19#. Tolerating Latuda.   No delusions or visions like before and no trouble some intrusive thoughts. Rash is better with clindamycin solution. Busy with work.  [employer] employee of the month twice at [employer]  37 hours per month. Sleep 8-9 hours. Hydroxyzine 10 HS helps sleep and can't take in daytime. SE can't lose wt on Latuda with medical help.  Prior psychiatric medications: risperidone with cognitive side effects, Abilify NR,  Zyprexa SE glucose, Seroquel 600 mg SE wt, perphenazineSE, Saphris 20 partial Short but SE increase glucose , haloperidol EPS, Geodon EPS,  Vraylar, loxapine SE, Caplyta NR, Latuda 120 He's prone to EPS.  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 lithium 1500,   N-acetylcysteine with minimal benefit, , Amantadine  with vomiting.  Sertraline and lexapro sexual SE,   Propranolol for tremor,   Ozempic GI px   Review of Systems:  Review of Systems  Constitutional:  Positive for fatigue.  Gastrointestinal:  Positive for constipation and rectal pain.  Endocrine: Positive for polydipsia and polyuria.  Genitourinary:  Negative for decreased urine volume and frequency.  Musculoskeletal:  Positive for myalgias.  Skin:  Positive for rash.       Itching resolved with hydroxyzine  Neurological:  Negative for tremors and weakness.  Psychiatric/Behavioral:  Positive for decreased concentration. Negative for agitation, behavioral problems, confusion, dysphoric mood, hallucinations, self-injury, sleep disturbance and suicidal ideas. The patient is hyperactive.     Medications: I have reviewed the patient's current medications.  Current Outpatient Medications  Medication Sig Dispense Refill   Accu-Chek Softclix Lancets lancets Use as instructed to check blood sugar 4 times daily. DX:E11.65 300 each 3   Adapalene-Benzoyl Peroxide 0.1-2.5 % gel SMARTSIG:1 Application Topical Every Evening     Blood Glucose Monitoring Suppl (CONTOUR NEXT MONITOR) w/Device KIT Check sugar 4 times daily 1 kit 0   Blood Glucose Monitoring Suppl (FREESTYLE LITE) DEVI Use as instructed to check sugar 4 times daily 1 each 0   Docusate Sodium (COLACE PO) Take 2 tablets by mouth daily.     empagliflozin (JARDIANCE) 25 MG TABS tablet Take 1 tablet (25 mg total) by mouth daily before breakfast. 90 tablet 3   famotidine (PEPCID) 40 MG tablet Take 40 mg by mouth 2 (two) times daily.     fenofibrate (TRICOR) 145 MG  tablet TAKE 1 TABLET BY MOUTH EVERY DAY 90 tablet 0   glucose blood (ACCU-CHEK GUIDE) test strip Use as instructed to check blood sugar 4 times daily. DX:E11.65 300 each 3   hydrOXYzine (ATARAX) 10 MG tablet TAKE 1 TABLET BY MOUTH THREE TIMES A DAY AS NEEDED (Patient taking differently: Take 10 mg by mouth at bedtime.) 270 tablet 0    insulin aspart (NOVOLOG FLEXPEN) 100 UNIT/ML FlexPen Inject 5-20 Units into the skin in the morning, at noon, in the evening, and at bedtime. 30 mL 3   insulin detemir (LEVEMIR FLEXPEN) 100 UNIT/ML FlexPen Inject 30 Units into the skin daily. 30 mL 2   Insulin Pen Needle (BD PEN NEEDLE NANO U/F) 32G X 4 MM MISC USE 4 TIMES DAILY WITH INSULIN PENS 400 each 1   metFORMIN (GLUCOPHAGE-XR) 500 MG 24 hr tablet Take 2 tablets (1,000 mg total) by mouth 2 (two) times daily with a meal. 360 tablet 3   valsartan (DIOVAN) 160 MG tablet Take 160 mg by mouth daily.     ezetimibe (ZETIA) 10 MG tablet TAKE 1 TABLET (10 MG TOTAL) BY MOUTH DAILY AFTER SUPPER. 90 tablet 0   Lurasidone HCl 120 MG TABS Take 1 tablet (120 mg total) by mouth daily with breakfast. 90 tablet 1   No current facility-administered medications for this visit.    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 Recurs if cannot walk for a week.      TOBACCO ABUSE, HX OF 07/31/2009    Family History  Problem Relation Age of Onset   Diabetes Mother    Breast cancer Mother        was told not hereditary   Cirrhosis Mother        fatty liver    High blood pressure Mother    High blood pressure Father    Diabetes Father    Melanoma Maternal Uncle    Colon cancer Neg Hx    Stomach cancer Neg Hx    Pancreatitis Neg Hx    Heart disease Neg Hx    Kidney disease Neg Hx    Liver disease Neg Hx     Social History   Socioeconomic History   Marital status: Single    Spouse name: Not on file   Number of children: 0   Years of education: Not on file   Highest education level: Bachelor's degree (e.g., BA, AB, BS)  Occupational History   Not on file  Tobacco Use   Smoking status: Former    Packs/day: 2.50    Years: 10.00  Total pack years: 25.00    Types: Cigarettes    Quit date: 10/27/2006    Years since quitting: 15.9   Smokeless tobacco: Never  Vaping Use   Vaping Use: Never used  Substance and Sexual Activity   Alcohol use: Not Currently    Comment: none at all   Drug use: No   Sexual activity: Not on file  Other Topics Concern   Not on file  Social History Narrative   Single. Not dating currently. No kids.    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   Please see review of systems for further details on the patient's review from today.   Objective:   Physical Exam:  There were no vitals taken for this visit.  Physical Exam Neurological:     Mental Status: He is alert and oriented to person, place, and time.     Cranial Nerves: No dysarthria.  Psychiatric:        Attention and Perception: Attention  normal.        Mood and Affect: Mood is anxious. Mood is not depressed or elated.        Speech: Speech is not rapid and pressured or slurred.        Behavior: Behavior is not agitated, aggressive or hyperactive. Behavior is cooperative.        Thought Content: Thought content is not paranoid or delusional. Thought content does not include homicidal or suicidal ideation. Thought content does not include suicidal plan.        Cognition and Memory: Cognition and memory normal.     Comments: No paranoia.   Insight and judgment fair to poor. Chronically self-preoccupied and hyperthymic style better. Much Less pressured and less manic presently.  But varies       Lab Review:     Component Value Date/Time   NA 139 05/17/2021 1952   K 3.8 05/17/2021 1952   CL 105 05/17/2021 1952   CO2 23 05/17/2021 1952   GLUCOSE 88 05/17/2021 1952   BUN 12 05/17/2021 1952   CREATININE 1.10 05/17/2021 1952   CREATININE 1.24 06/18/2018 1502   CALCIUM 9.2 05/17/2021 1952   PROT 7.5 05/17/2021 1952   ALBUMIN 4.7 05/17/2021 1952   AST 54 (H) 05/17/2021 1952   ALT 45 (H) 05/17/2021 1952   ALKPHOS 65 05/17/2021 1952   BILITOT 0.3 05/17/2021 1952   GFRNONAA >60 05/17/2021 1952   GFRNONAA 84 04/02/2018 1551   GFRAA 97 04/02/2018 1551       Component Value Date/Time   WBC 7.9 05/17/2021 1952   RBC 4.60 05/17/2021 1952   HGB 13.6 05/17/2021 1952   HCT 40.8 05/17/2021 1952   PLT 243 05/17/2021 1952   MCV 88.7 05/17/2021 1952   MCH 29.6 05/17/2021 1952   MCHC 33.3 05/17/2021 1952   RDW 13.2 05/17/2021 1952   LYMPHSABS 1.9 06/27/2016 1441   MONOABS 0.8 06/27/2016 1441  EOSABS 0.2 06/27/2016 1441   BASOSABS 0.1 06/27/2016 1441    Lithium Lvl  Date Value Ref Range Status  09/09/2019 1.1 0.6 - 1.2 mmol/L Final    12/14/19 lithium level 0.6 (trough 13 hours) , normal BMP and calcium   No results found for: "PHENYTOIN", "PHENOBARB", "VALPROATE", "CBMZ"   .res Assessment: Plan:    Rylyn was  seen today for follow-up and anxiety.  Diagnoses and all orders for this visit:  Bipolar I disorder, most recent episode (or current) manic (HCC) -     Lurasidone HCl 120 MG TABS; Take 1 tablet (120 mg total) by mouth daily with breakfast.  Generalized anxiety disorder -     Lurasidone HCl 120 MG TABS; Take 1 tablet (120 mg total) by mouth daily with breakfast.  Panic disorder with agoraphobia -     Lurasidone HCl 120 MG TABS; Take 1 tablet (120 mg total) by mouth daily with breakfast.  Insomnia due to mental condition  Marijuana abuse in remission   Cody Short has had significant disruptive mood and psychotic symptoms and substance abuse over the course of his treatment here.  It has been evident in chronic occupational dysfunction and some relational problems.  He is also been very prone to EPS and elevated blood sugars from atypicals making it difficult to get adequate mood stabilization.   Has a history of severe pancreatitis from Depakote.  He did not have an adequate Short to carbamazepine.   lithium was insufficient to control his symptoms in monotherapy and he developed diabetes insipidus.  There are no available mood stabilizers that do not have a warning of elevating blood sugar and cholesterol.  He has tried all the mood stabilizers that do not have this warning.  All of the available mood stabilizers left that have any effectiveness are antipsychotics and the FDA has required a class warning on all antipsychotics about blood sugar and cholesterol effects.  This is true despite the fact that not all antipsychotics are equal and increasing these risks.  The patient's reading of these potential side effects is complicating finding an effective treatment.  The only way to prove whether or not these meds are elevating his blood sugar and cholesterol is to stop the medications for a period of time.  This exposes him to manic risk but there is no chance of finding an adequate mood stabilizer  that will satisfy him until we can prove whether or not these meds are in fact affecting his blood sugar, cholesterol, blood pressure, and pulse.  Latuda  Been the best med so far for him.    Answered questions with no other good options obvious. Sleepiness was not better with reduction in Latuda from 120 to 60 mg daily. Continue Latuda 120 mg PM.  Is getting benefit.Marland Kitchen He still thinks it affects BS but it is controlled.   .   Discussed potential metabolic side effects associated with atypical antipsychotics, as well as potential risk for movement side effects. Advised pt to contact office if movement side effects occur.   continue Hydroxyzine 10 mg HS prn  This has been helpful for anxiety and itching and may have some mild antimanic effect.  Consider modafinil if necessary for function.  Disc risk of mania.  Disc SE.    He has a low stress tolerance.  He can be easily agitated in public. OK to schedule with Lanetta Inch for therapy  He has continued abstinence from marijuana is encouraged.  He has a history  of cannabinoid hyperemesis which finally convinced him to discontinue the marijuana.  His mood disorder and thought disorganization have improved since being off the marijuana.  He has a very long history of heavy marijuana dependence.   Constipation management 1.  Lots of water 2.  Powdered fiber supplement such as MiraLAX, Citrucel, etc. preferably with a meal 3.  2 stool softeners a day 4.  Milk of magnesia or magnesium tablets if needed Try lower dose miralax. He's going to see GI doc  Sleep hygiene. Disc OSA and how it affects alertness and started CPAP and much better.    Follow-up 12 weeks bc chronically unstable and typical phone calls between appts. But this has been better lately    Lynder Parents, Cody, DFAPA  Please see After Visit Summary for patient specific instructions.  Future Appointments  Date Time Provider [place]  10/09/2022  5:00 PM Anson [place], [facility] None  11/24/2022  4:00 PM Philemon Kingdom, [place] [facility] None  12/08/2022  3:30 PM Dohmeier, Asencion Partridge, Cody [facility] None    No orders of the defined types were placed in this encounter.      -------------------------------

## 2022-09-25 NOTE — Telephone Encounter (Signed)
Appt today

## 2022-09-29 ENCOUNTER — Ambulatory Visit: Payer: 59 | Admitting: Neurology

## 2022-10-08 ENCOUNTER — Encounter: Payer: Self-pay | Admitting: Internal Medicine

## 2022-10-09 ENCOUNTER — Ambulatory Visit (INDEPENDENT_AMBULATORY_CARE_PROVIDER_SITE_OTHER): Payer: 59 | Admitting: Mental Health

## 2022-10-09 DIAGNOSIS — F3110 Bipolar disorder, current episode manic without psychotic features, unspecified: Secondary | ICD-10-CM

## 2022-10-09 DIAGNOSIS — R69 Illness, unspecified: Secondary | ICD-10-CM | POA: Diagnosis not present

## 2022-10-09 NOTE — Progress Notes (Signed)
[facility] Note  Name: Cody Short Date: 09/10/2022 MRN: [id number] DOB: August 07, 1976 PCP: Lewis Moccasin, MD  Time spent:  53 minutes  Treatment:   ind. Therapy  Virtual Visit via Telehealth Note Connected with patient by a telemedicine/telehealth application via video, with their informed consent, and verified patient privacy and that I am speaking with the correct person using two identifiers. I discussed the limitations, risks, security and privacy concerns of performing psychotherapy and the availability of in person appointments. I also discussed with the patient that there may be a patient responsible charge related to this service. The patient expressed understanding and agreed to proceed. I discussed the treatment planning with the patient. The patient was provided an opportunity to ask questions and all were answered. The patient agreed with the plan and demonstrated an understanding of the instructions. The patient was advised to call  our office if  symptoms worsen or feel they are in a crisis state and need immediate contact.   Therapist Location: office Patient Location: home     Mental Status Exam:    Appearance:    Casual     Behavior:   Appropriate  Motor:   WNL  Speech/Language:    Clear and Coherent  Affect:   Full range   Mood:   Euthymic, anxious  Thought process:   Logical, linear, goal directed  Thought content:     WNL  Sensory/Perceptual disturbances:     none  Orientation:   x4  Attention:   Good  Concentration:   Good  Memory:   Intact  Fund of knowledge:    Consistent with age and development  Insight:     Good  Judgment:    Good  Impulse Control:   Good     Reported Symptoms:  anxiety, rumination, intermittent depressed mood, impulsivity  Risk Assessment: Danger to Self:  No Self-injurious Behavior: No Danger to Others: No Duty to Warn:no Physical Aggression / Violence:No  Access to Firearms a concern: No  Gang  Involvement:No  Patient / guardian was educated about steps to take if suicide or homicide risk level increases between visits: yes While future psychiatric events cannot be accurately predicted, the patient does not currently require acute inpatient psychiatric care and does not currently meet [facility] involuntary commitment criteria.  Subjective:  Patient engaged in telehealth session via video.  Patient shared progress since last session. 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 He said they had a lot in common and has difficulty getting his mind off of her at times. Facilitate his identifying thoughts to focus on as a way to reframe his thinking where he was able to do this in session. He expresses wanting to be able to "move on"without the frequent reminders which occur next days. Explored interest, provided support and encouragement for him to engage in pleasurable interest which he stated consists of video games and talking to a few friends he has online.    Interventions:  motivational interviewing, CBT    Diagnoses:  No diagnosis found.        Plan: Patient to utilize coping skills as discussed, continue his  medication compliance to maintain mood stability.  Long-term goals:   Maintain symptom reduction: The patient will report sustained reduction in symptoms of anxiety using both CBT and mindfulness interventions for 3 consecutive months progressively.  Improve emotional regulation: The patient will learn and apply CBT and mindfulness-based strategies to regulate emotions, such as mindfulness-based stress reduction and cognitive restructuring, and  report an improvement in emotional regulation for at least 3 consecutive months progressively.    Short-term goal:  The patient will learn and apply CBT and mindfulness-based coping skills for managing anxiety and practice using it between sessions.       2.   The patient will CBT and mindfulness-based interventions to increase awareness of negative thought patterns. 3.   The patient will keep and maintain employment to keep financial stress manageable       4.   The patient will decrease anxiety specifically when driving.         Waldron Session, [facility]

## 2022-10-22 ENCOUNTER — Other Ambulatory Visit: Payer: Self-pay | Admitting: Psychiatry

## 2022-10-22 DIAGNOSIS — F411 Generalized anxiety disorder: Secondary | ICD-10-CM

## 2022-10-22 DIAGNOSIS — F4001 Agoraphobia with panic disorder: Secondary | ICD-10-CM

## 2022-10-22 DIAGNOSIS — F3110 Bipolar disorder, current episode manic without psychotic features, unspecified: Secondary | ICD-10-CM

## 2022-10-23 ENCOUNTER — Encounter: Payer: Self-pay | Admitting: Family Medicine

## 2022-11-20 ENCOUNTER — Telehealth: Payer: Self-pay | Admitting: Psychiatry

## 2022-11-20 NOTE — Telephone Encounter (Signed)
Pt LVM @ 10:32a.  He said he received jury summons for 3/13.  He's wanting to know if he can get a note because he feels he cannot serve with his condition.  Next appt 4/10

## 2022-11-20 NOTE — Telephone Encounter (Signed)
Notified patient we needed a copy of the summons. He said if his fax machine is working he will fax it, otherwise he will mail it.

## 2022-11-24 ENCOUNTER — Encounter: Payer: Self-pay | Admitting: Internal Medicine

## 2022-11-24 ENCOUNTER — Ambulatory Visit: Payer: 59 | Admitting: Internal Medicine

## 2022-11-24 VITALS — BP 120/70 | HR 91 | Ht 69.0 in | Wt 225.0 lb

## 2022-11-24 DIAGNOSIS — E1165 Type 2 diabetes mellitus with hyperglycemia: Secondary | ICD-10-CM

## 2022-11-24 DIAGNOSIS — E782 Mixed hyperlipidemia: Secondary | ICD-10-CM | POA: Diagnosis not present

## 2022-11-24 DIAGNOSIS — E061 Subacute thyroiditis: Secondary | ICD-10-CM | POA: Diagnosis not present

## 2022-11-24 LAB — POCT GLYCOSYLATED HEMOGLOBIN (HGB A1C): Hemoglobin A1C: 7.3 % — AB (ref 4.0–5.6)

## 2022-11-24 NOTE — Patient Instructions (Signed)
Please continue: - Metformin ER 2000 mg at dinnertime - Jardiance 25 mg daily in am - Novolog before meals: 5-20 units 3x a day before meals - Semglee/Levemir 15 units at bedtime  Please return in 3-4 months with your sugar log.

## 2022-11-24 NOTE — Progress Notes (Signed)
Patient ID: Cody Short, male   DOB: 18-Aug-1976, 47 y.o.   MRN: [id number]  HPI: Cody Short is a 47 y.o.-year-old male, presenting for follow-up for DM2, dx in ~2010, insulin-dependent, uncontrolled, without long term complications. Last visit 4 months ago.  Interim history: No increased urination, but no blurry vision, nausea, chest pain.  He feels he gained weight as he had to increase the Latuda dose since last visit.  DM2: Patient's diabetes is directly correlated with his bipolar disease treatment: In the past he had frequent urination resolved after stopping lithium.  While transitioning off lithium, his Saphris dose was increased the blood sugars started to increase afterwards.    He was then taken off Saphris and started on Loxitane.  This was making him sleepier, but was not increasing his blood sugars.  In fact, sugars started to improve and he was able to reduce his insulin doses.   She had to stop Loxitane 2/2 increased BP and pulse.  In summer 2021, he switched to [place] >> sugars improved significantly on this.  In fact, he tells me that he was able to come off insulin completely while on this.  However, this did not work for him -was having anger outbursts on it.   He started on Latuda >> feeling better on this. However, on this, he was eating more >> sugars increased.  Also, he gained weight. He continues on this.  He was on an appetite suppressant (Topamax). This was causing nausea >> stopped.   Reviewed HbA1c levels: Lab Results  Component Value Date   HGBA1C 7.4 (A) 07/15/2022   HGBA1C 7.4 (A) 03/06/2022   HGBA1C 9.1 (A) 10/10/2021  05/07/2021: HbA1c 9.3% 12/22/2018: HbA1c 7.2%  Pt is on a regimen of: - Metformin ER 2000 mg at dinnertime - Jardiance 10 >> 25 mg daily in am - Novolog before meals:28-35 units 2-3x >> 35-45 >> 5-20 >> 5-15 units 3x a day before meals - Lantus 15 >> 0 >> 25 >> 40 >> 60-75 >> 60 >> Semglee/ Lantus 45 >> 15  units at bedtime He could not tolerate Ozempic >> nausea, AP We had to stop Jardiance due to increased urination. He was previously on Lantus but stopped since sugars improved. He tried Glipizide >> hypoglycemia in the 40s repeatedly. Lowest: 25. Also, Glipizide 2.5 mg in am >> nausea, vomiting, constipation We tried Invokana >> bothersome urination (when he was working) >> had to stop. He had pancreatitis 2/2 Depakote and HTG in the past. He stopped Actos b/c stomach pain. >>  We stopped Cycloset >> inefficient, could not use b/c price.  Pt checks his sugars 4 times a day per review of his log: - am:  119-208, 268 >> 104-178, 181 >> 105-156, 200, 242 >> 108-144, 157, 175 - 2h after b'fast: 100-166, 244 >> 101 >> 162, 173, 325 >> n/c  - lunch:  172-259 >> 175-260, 343 >> 67, 85-120 >> 85-143, 154 >> n/c - 2h after lunch: 140-262, 310 >> 110-202, 290 >> 143, 144 >> n/c - before dinner: 97, 106-291, 330 >> 64-176, 196 >> 81-144, 218, 224 >> 94-153, 244 - 2h after dinner: 140-278, 340, 354 >> 76, 80, 160-201, 267 >> 89-174, 232 >> n/c - bedtime:  119-207 >> 80-160, 190 >> 107-180 >> n/c >> 91-232 - nighttime: 89-181 >> see above >> 134 >> 83, 95 >> n/c Lowest sugar was 69 >> 90  >> 71 >> 81 >> 80; he has hypoglycemia awareness in  the 70s. Highest sugar was 337 >> 354 >> 232 >> 242 >> 239.  Glucometer: FreeStyle Lite (he likes this)  No history of CKD, last BUN/creatinine:  09/10/2022: 20/1.3, GFR 69.5, glucose 127 Lab Results  Component Value Date   BUN 12 05/17/2021   CREATININE 1.10 05/17/2021   He had MAU in the past, improved at last check: Lab Results  Component Value Date   MICRALBCREAT 35.2 (H) 05/10/2020   MICRALBCREAT 26.7 04/02/2018   MICRALBCREAT 3.7 02/10/2017   MICRALBCREAT 27.3 12/17/2015   MICRALBCREAT 4.0 02/22/2015  12/22/2018: 262 On Diovan.  He has mixed hyperlipidemia: 09/10/2022: 173/199/40/93 06/23/2022: 188/347/41/78 Lab Results  Component Value Date    CHOL 225 (H) 02/10/2022   HDL 37 (L) 02/10/2022   LDLCALC 145 (H) 02/10/2022   LDLDIRECT 120.0 04/02/2018   TRIG 237 (H) 02/10/2022   CHOLHDL 4.6 08/06/2020  05/03/2020: 225/345/38/126 11/23/2019: 109/148/34/49 12/22/2018: 220/359/41/145 On fenofibrate 145 >> 160.   On Zetia.  He tried Repatha.  Rosuvastatin caused leg cramps, reportedly elevated CK (900s).  Most recent CK was 562.  - last eye exam was in 2023: No DR. Decreased vision - depth.  -+ Numbness but no tingling in his feet. Last foot exam 03/06/2022.  He developed chest tightness 05/2021 and saw Dr. Jacinto Halim.  He had CAC score of 0 (07/11/2021). He has a history of scrotal rash-improved on Nystatin + Triamcinolone.  He may need refills in the future. He has recurrent groin furunculosis. 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  Subclinical thyrotoxicosis: TSH levels were reviewed:  06/23/2022: TSH 1.13 Lab Results  Component Value Date   TSH 1.11 10/10/2021   TSH <0.01 (L) 01/18/2021   TSH <0.01 (L) 12/11/2020   TSH 0.02 (L) 09/14/2020   TSH 4.03 12/17/2015   TSH 2.08 01/03/2014   TSH 5.15 11/20/2011   TSH 2.15 07/04/2010   TSH 3.82 12/12/2009   TSH 1.81 10/29/2007   FREET4 0.76 10/10/2021   FREET4 1.13 01/18/2021   FREET4 1.56 12/11/2020   FREET4 1.41 09/14/2020   T3FREE 3.7 10/10/2021   T3FREE 3.5 01/18/2021   T3FREE 4.1 12/11/2020   T3FREE 3.9 09/14/2020  05/07/2021: TSH 1.21 08/06/2020: TSH 0.079 05/03/2020: TSH 0.528 12/22/2018: 3.648  We checked a thyroid uptake and scan (01/02/2021) and this was consistent with thyroiditis: The thyroid scan is unremarkable. No hot or cold thyroid nodules are identified. 4 hour I-123 uptake = 2.7% (normal 5-20%) 24 hour I-123 uptake = 5.4% (normal 10-30%)   IMPRESSION: Low 4 hour and 24 hour or I 123 uptake may suggest thyroiditis.   Pt denies: - feeling nodules in neck - hoarseness - dysphagia - choking  He had problems with muscle cramps/abdominal  pain and lipase was found to be elevated again, at 67 (11-51) in 04/2021.  At that time, he also had transaminitis and an increase CK at 562 (49-347).    ROS:+ see HPI  I reviewed pt's medications, allergies, PMH, social hx, family hx, and changes were documented in the history of present illness. Otherwise, unchanged from my initial visit note.  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 Recurs if  cannot walk for a week.      TOBACCO ABUSE, HX OF 07/31/2009   Past Surgical History:  Procedure Laterality Date   pilar cystectomy     History   Social History   Marital Status: Single    Spouse Name: N/A   Number of Children: 0   Occupational History      Social History Main Topics   Smoking status: Former Smoker - quit in 2010   Smokeless tobacco: Never Used   Alcohol Use: No     Comment: none at all   Drug Use: No   Current Outpatient Medications  Medication Sig Dispense Refill   Accu-Chek Softclix Lancets lancets Use as instructed to check blood sugar 4 times daily. DX:E11.65 300 each 3   Adapalene-Benzoyl Peroxide 0.1-2.5 % gel SMARTSIG:1 Application Topical Every Evening     Blood Glucose Monitoring Suppl (CONTOUR NEXT MONITOR) w/Device KIT Check sugar 4 times daily 1 kit 0   Blood Glucose Monitoring Suppl (FREESTYLE LITE) DEVI Use as instructed to check sugar 4 times daily 1 each 0   Docusate Sodium (COLACE PO) Take 2 tablets by mouth daily.     empagliflozin (JARDIANCE) 25 MG TABS tablet Take 1 tablet (25 mg total) by mouth daily before breakfast. 90 tablet 3   ezetimibe (ZETIA) 10 MG tablet TAKE 1 TABLET (10 MG TOTAL) BY MOUTH DAILY AFTER SUPPER. 90 tablet 0    famotidine (PEPCID) 40 MG tablet Take 40 mg by mouth 2 (two) times daily.     fenofibrate (TRICOR) 145 MG tablet TAKE 1 TABLET BY MOUTH EVERY DAY 90 tablet 0   glucose blood (ACCU-CHEK GUIDE) test strip Use as instructed to check blood sugar 4 times daily. DX:E11.65 300 each 3   hydrOXYzine (ATARAX) 10 MG tablet TAKE 1 TABLET BY MOUTH THREE TIMES A DAY AS NEEDED (Patient taking differently: Take 10 mg by mouth at bedtime.) 270 tablet 0   insulin aspart (NOVOLOG FLEXPEN) 100 UNIT/ML FlexPen Inject 5-20 Units into the skin in the morning, at noon, in the evening, and at bedtime. 30 mL 3   insulin detemir (LEVEMIR FLEXPEN) 100 UNIT/ML FlexPen Inject 30 Units into the skin daily. 30 mL 2   Insulin Pen Needle (BD PEN NEEDLE NANO U/F) 32G X 4 MM MISC USE 4 TIMES DAILY WITH INSULIN PENS 400 each 1   Lurasidone HCl 120 MG TABS TAKE 1 TABLET DAILY WITH   BREAKFAST (DOSE CHANGE) 90 tablet 1   metFORMIN (GLUCOPHAGE-XR) 500 MG 24 hr tablet Take 2 tablets (1,000 mg total) by mouth 2 (two) times daily with a meal. 360 tablet 3   valsartan (DIOVAN) 160 MG tablet Take 160 mg by mouth daily.     No current facility-administered medications for this visit.    No current facility-administered medications on file prior to visit.    Allergies  Allergen Reactions   Tricor [Fenofibrate] Other (See Comments)    Myopathy   Divalproex Sodium Other (See Comments)    unknown   Methylphenidate Hcl     Aggravate bipolar   Oxycodone Hcl     REACTION: hallucinations   Ozempic (0.25 Or 0.5 Mg-Dose) [Semaglutide(0.25 Or 0.5mg -Dos)] Nausea Only   Paroxetine     Aggravate bipolar disorder   Rosuvastatin Other (See Comments)    Myopathy -muscle cramps, elevated CK   Family History  Problem Relation Age of Onset   Diabetes Mother    Breast cancer Mother        was  told not hereditary   Cirrhosis Mother        fatty liver   High blood pressure Mother    High blood pressure Father    Diabetes Father    Melanoma  Maternal Uncle    Colon cancer Neg Hx    Stomach cancer Neg Hx    Pancreatitis Neg Hx    Heart disease Neg Hx    Kidney disease Neg Hx    Liver disease Neg Hx    PE: BP 120/70 (BP Location: Left Arm, Patient Position: Sitting, Cuff Size: Normal)   Pulse 91   Ht 5\' 9"  (1.753 m)   Wt 225 lb (102.1 kg)   SpO2 97%   BMI 33.23 kg/m   Wt Readings from Last 3 Encounters:  11/24/22 225 lb (102.1 kg)  07/15/22 230 lb 9.6 oz (104.6 kg)  03/06/22 231 lb (104.8 kg)   Constitutional: overweight, in NAD Eyes: EOMI, no exophthalmos ENT:no thyromegaly, no cervical lymphadenopathy Cardiovascular: RRR, No MRG Respiratory: CTA B Musculoskeletal: no deformities Skin: moist, warm, no rashes Neurological: no tremor with outstretched hands  ASSESSMENT: 1. DM2, insulin-dependent, uncontrolled, without long term complications, but with hyperglycemia  His test were negative for type 1 diabetes: Component     Latest Ref Rng 05/28/2015  C-Peptide     0.80 - 3.90 ng/mL 2.88  Glucose, Fasting     65 - 99 mg/dL 07/28/2015 (H)  Glutamic Acid Decarb Ab     <5 IU/mL <5  Pancreatic Islet Cell Antibody     < 5 JDF Units <5   - His diabetes is difficult to manage because of multiple intolerances: - We tried to add Invokana but he did not tolerated due to increased urination.  - We cannot use a DPP 4 inhibitor or a GLP-1 receptor agonist due to his history of pancreatitis.  - He had to stop Actos >>  abd. Pain - we stopped Cycloset >> expensive and not effective - we tried Glipizide >> GI sxs: N/V/D - he tried the Dexcom G6 CGM but he felt that this was giving him a lot of errors and stopped  2. HTG  3.  Obesity class I  4.  Subacute thyroiditis  PLAN:  1. Patient with uncontrolled diabetes type 2, on oral antidiabetic regimen with metformin and SGLT2 inhibitor and also basal-bolus insulin regimen, with improving control after starting Jardiance.  At last visit, sugars appears to be mostly at goal but  with hyperglycemic spikes usually after dietary indiscretions especially after drinking black current syrup.  We discussed that he needed to stop this.  We increased his Jardiance at that time to 25 mg daily. -At today's visit, sugars are mostly in target range but with occasional hyperglycemic spikes, especially at bedtime.  We discussed that for the most rich meals, he may need to take slightly more NovoLog.  He tells me that he cannot appreciate how much he plans to eat so sometimes he underdoses NovoLog.  I advised him to continue to work on anticipating the insulin need for the meals but will not change the regimen as I feel as of now he is doing a good job with this.  Will continue Jardiance and metformin.  Also, we can continue his basal insulin.  He was able to reduce it significantly since last visit, currently only taking 15 units.  Since sugars in the morning are at goal, will not change this dose. - I advised him to: Patient Instructions  Please  continue: - Metformin ER 2000 mg at dinnertime - Jardiance 25 mg daily in am - Novolog before meals: 5-20 units 3x a day before meals - Semglee/Levemir 15 units at bedtime  Please return in 3-4 months with your sugar log.   - we checked his HbA1c: 7.3% (slightly better) - advised to check sugars at different times of the day - 1x a day, rotating check times - advised for yearly eye exams >> he is UTD - return to clinic in 3-4 months  2. HL -Predominant hypertriglyceridemia -Reviewed latest lipid panel from 06/15/2022 and 09/15/2022.  Triglycerides only slightly elevated, at 199 on the last lipid panel and LDL under 100, at 93. -On fenofibrate 160 mg daily and Zetia 10 mg daily -he had mm cramps + and elevated CK >> normalized.  Muscle cramps improved.  3.  Obesity class I -He gained a significant amount of weight on Latuda -He was not able to start Ozempic due to history of pancreatitis.  We did discuss in the past about possibly trying  on a low dose and watching him closely but he was reticent to start. -Currently on Jardiance which should also help with weight loss -He is exercising -He is also using the CPAP for OSA, diagnosed before last visit -Unfortunately, he needs to get a snack with 350 cal at bedtime when he takes [place]. -He lost 5 pound before last visit  4.  Subacute thyroiditis -Diagnosed by uptake and scan -Resolved.  TSH was normal 06/23/2022: 1.13 -No signs or symptoms of thyrotoxicosis  Cody Kingdom, MD PhD [facility]

## 2022-12-08 ENCOUNTER — Encounter: Payer: Self-pay | Admitting: Neurology

## 2022-12-08 ENCOUNTER — Ambulatory Visit: Payer: 59 | Admitting: Neurology

## 2022-12-08 VITALS — BP 126/77 | HR 75 | Ht 70.0 in | Wt 218.0 lb

## 2022-12-08 DIAGNOSIS — G4733 Obstructive sleep apnea (adult) (pediatric): Secondary | ICD-10-CM

## 2022-12-08 DIAGNOSIS — R252 Cramp and spasm: Secondary | ICD-10-CM | POA: Diagnosis not present

## 2022-12-08 DIAGNOSIS — T733XXS Exhaustion due to excessive exertion, sequela: Secondary | ICD-10-CM

## 2022-12-08 MED ORDER — MAGNESIUM CITRATE PO SOLN: 1.0000 | Freq: Once | ORAL | 5 refills | Status: AC

## 2022-12-08 NOTE — Progress Notes (Signed)
Provider:  Larey Seat, Short  Primary Care Physician:  Cody Bien, Short [address] [place] [address]     Referring Provider: Fanny Bien, Short [address] [place],  [place] [address]          Chief Complaint according to patient   Patient presents with:     New Patient (Initial Visit)     Cody Short, patient has had to buy his CPAP machine, needs supplies.       HISTORY OF PRESENT ILLNESS:  12-08-2022:  Cody Short is a 47 y.o. male patient who is here for revisit 12/08/2022 for  his CPAP compliance visit:  Summary: Pleasure for the second time to meet with Cody Short who prefers to go by his middle name Cody Short. He was diagnosed with obstructive sleep apnea at an AHI of 15.8/h, strongly REM sleep dependent apnea- and associated with loud snoring- after initially presenting for workup of restless legs.   His initial visit showed that the patient was excessively daytime sleepy and had high risks factors for obstructive sleep apnea which was not confirmed by a sleep test which I have quoted below.  At the time he was insured in a different way and his insurance would not cover the CPAP and before he could switch insurance companies he decided to rather pay out right over $800 for the machine as he felt he could not wait. He is using an auto titration ResMed Airview AutoSet 10 with a setting between 7 and 15 cm water pressure and 2 cm expiratory pressure relief.  He is 100% compliant by days and 97% compliant by hours highly compliant.  Average user time is 7 hours 29 minutes each night.  His residual AHI is now 1.7/h and the 95th percentile pressure is 14.4 cm water.  There have been no high air leaks so his current interface is working well but he states it is already 3 months old.  So since he is not have insurance covering his machine but paid out right there has been no automatic refill of supplies and I will have to order new supplies  for him today.  With this compliance however I have no hesitation.  He reports that he has no excessive daytime sleepiness per se but he was very fatigued in the past 2 days Epworth score is endorsed at 3 points and his fatigue severity scale was endorsed at 9 / 63 points.  :        DATE OF RECORDING: 04/20/2022 REFERRING M.D.:  Cody Short Study Performed:  CPAP  Titration HISTORY:  Cody Short is a 47 year old Caucasian male patient of Cody Cody Short, who returns following a PSG at [facility] sleep from 03-20-2022, diagnosing him with OSA at an AHI of 15.8/h with strong REM sleep dependence, mild PLMD, Loud snoring.  The patient endorsed the Epworth Sleepiness Scale at 4/24 points and the Fatigue Score at -60/63 points.  RLS endorsed.  The patient's weight 229 pounds with a height of 69 (inches), resulting in a BMI of 34. kg/m2. The patient's neck circumference measured 18 inches.  CPAP was initiated at 7 cmH20 with a FFM , medium Simplus mask, heated humidity per AASM standards and pressure was advanced to 13/cmH20 because of hypopneas, apneas and desaturations.  At a PAP pressure of 13 cmH20, there was a reduction of the AHI to 0.0/h with improvement of sleep apnea. The  patient slept 79 minutes and was part of that time in REM sleep.  Lights Out was at 22:01 and Lights On at 05:06. Total recording time (TRT) was 425.5 minutes, with a total sleep time (TST) of 299 minutes. The patient's sleep latency was 39 minutes. REM latency was 38 minutes.  The sleep efficiency was 70.3 %.    SLEEP ARCHITECTURE: WASO (Wake after sleep onset)  was 97.5 minutes.  There were 23 minutes in Stage N1, 143.5 minutes Stage N2, 62 minutes Stage N3 and 70.5 minutes in Stage REM.  The percentage of Stage N1 was 7.7%, Stage N2 was 48.%, Stage N3 was 20.7% and Stage R (REM sleep) was 23.6%. The sleep architecture was notable for REM rebound.   RESPIRATORY ANALYSIS:  There was a total of 23 respiratory  events: 0 obstructive apneas, 1 central apneas and 0 mixed apneas with a total of 1 apneas and an apnea index (AHI) of .2 /hour. There were 22 hypopneas with a hypopnea index of 4.4/hour. The patient also had 0 respiratory event related arousals (RERAs).      The total APNEA/HYPOPNEA INDEX  (AHI) was 4.6 /hour and the total RESPIRATORY DISTURBANCE INDEX was 4.6 /hour  2 events occurred in REM sleep and 21 events in NREM. The REM AHI was 1.7 /hour versus a non-REM AHI of 5.5 /hour.  The patient spent 142.5 minutes of total sleep time in the supine position and 157 minutes in non-supine. The supine AHI was 5.0, versus a non-supine AHI of 4.2.  OXYGEN SATURATION & C02:  The baseline 02 saturation was 96%, with the lowest being 91%. Time spent below 89% saturation equaled 0 minutes.  PERIODIC LIMB MOVEMENTS:  The patient had a total of 79 Periodic Limb Movements. The Periodic Limb Movement (PLM) Arousal index was 0 .2 /hour.  The arousals were noted as: 39 were spontaneous, 1 were associated with PLMs, 3 were associated with respiratory events.  Audio and video analysis did not show any abnormal or unusual movements, behaviors, phonations or vocalizations.  Snoring was controlled. 2 bathroom visits were seen. Single channel EKG was in regular rhythm.   DIAGNOSIS Obstructive Sleep Apnea responded well to 13 cm water pressure CPAP, using a FFM Simplus medium size.   PLANS/RECOMMENDATIONS: starting auto CPAP from 7 through 15 cm water, 2 cm EPR and a FFM Simplus medium size.   PAP therapy compliance is defined as 4 hours or more of nightly use.  Any apnea patient should avoid sedatives, hypnotics, and alcohol consumption at bedtime.   02-03-2022:  Cody Short is a 47 y.o. year old White or Caucasian male patient seen here as a referral on 02/03/2022 from Cody Cody Short office  for a sleep consultation .  Chief concern according to patient :  " always tired non restorative sleep-referred for  snoring after weight gain on Latuda. Bipolar condition better  controlled on medication, weight gain of 40 pounds". I am so fatigued and sleepy all he time, I wake up after naps and feel more exhausted, insomnia is no longer a problem.    I have the pleasure of seeing Cody Short on 02-03-2022, a right -handed White or Caucasian male with a possible sleep disorder.  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  Sleep relevant medical history: Nocturia only one time, can go back to sleep-,No TBI, no MVA, No ENT surgery.     Family medical /sleep history: father  is oxygen dependent-   Mother has insomnia, no sleep walkers. nephew with night terrors, age 85 .    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  Tobacco use: 15 years ago.   ETOH use none ,  Caffeine intake in form of Coffee( /) Soda( mountain dew 1-2 at work) Tea ( one cup breakfast ) or energy drinks. Regular exercise in form of walking- no longer too fatigued to do anything else.      Review of Systems: Out of a complete 14 system review, the patient complains of only the following symptoms, and all other reviewed systems are negative.:  No more Fatigue, sleepiness , no longer snoring,  non-fragmented sleep, improved  RLS, Nocturia is improved, to one time.   Down from 3 times.    How likely are you to doze in the following situations: 0 = not likely, 1 = slight chance, 2 = moderate chance, 3 = high chance   Sitting and Reading? Watching Television? Sitting inactive in a public place (theater or meeting)? As a passenger in a car for an hour without a  break? Lying down in the afternoon when circumstances permit? Sitting and talking to someone? Sitting quietly after lunch without alcohol? In a car, while stopped for a few minutes in traffic?    He reports that he has no excessive daytime sleepiness per se but he was very fatigued in the past 2 days Epworth score is endorsed at 3 points and his fatigue severity scale was endorsed at 9 / 63 points.   FSS endorsed at 60/ 63 points  before CPAP !  Blood sugar is better controlled, he lost 20 pounds since CPAP, he is doing regularly exercises. 6-15.000 steps a day.   Social History   Socioeconomic History   Marital status: Single    Spouse name: Not on file   Number of children: 0   Years of education: Not on file   Highest education level: Bachelor's degree (e.g., BA, AB, BS)  Occupational History   Not on file  Tobacco Use   Smoking status: Former    Packs/day: 2.50    Years: 10.00    Total pack years: 25.00    Types: Cigarettes    Quit date: 10/27/2006    Years since quitting: 16.1   Smokeless tobacco: Never  Vaping Use   Vaping Use: Never used  Substance and Sexual Activity   Alcohol use: Not Currently    Comment: none at all   Drug use: No   Sexual activity: Not on file  Other Topics Concern   Not on file  Social History Narrative   Single. Not dating currently. No kids.    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   Caffeine: 2 C a day   Social Determinants of Radio broadcast assistant Strain: Not on [employer] Insecurity: Not on file  Transportation Needs: Not on file  Physical Activity: Not on file  Stress: Not on file  Social Connections: Not on file    Family History  Problem Relation  Age of Onset   Diabetes Mother    Breast cancer Mother        was told not hereditary   Cirrhosis Mother        fatty liver   High blood pressure Mother    High blood  pressure Father    Diabetes Father    Melanoma Maternal Uncle    Colon cancer Neg Hx    Stomach cancer Neg Hx    Pancreatitis Neg Hx    Heart disease Neg Hx    Kidney disease Neg Hx    Liver disease Neg Hx     Past Medical History:  Diagnosis Date   Acne varioliformis 07/04/2010   Anxiety    Bipolar affective disorder ([place])    Depression    DM w/o Complication Type II A999333   High cholesterol    Hypertension    HYPERTRIGLYCERIDEMIA 10/14/2010   Nocturia 07/04/2010   PILAR CYST 08/03/2007   Cyst in groin-states comes up when blood sugar gets high. Recurs if cannot walk for a week.      TOBACCO ABUSE, HX OF 07/31/2009    Past Surgical History:  Procedure Laterality Date   pilar cystectomy       Current Outpatient Medications on File Prior to Visit  Medication Sig Dispense Refill   Accu-Chek Softclix Lancets lancets Use as instructed to check blood sugar 4 times daily. DX:E11.65 300 each 3   Adapalene-Benzoyl Peroxide 0.1-2.5 % gel SMARTSIG:1 Application Topical Every Evening     Blood Glucose Monitoring Suppl (CONTOUR NEXT MONITOR) w/Device KIT Check sugar 4 times daily 1 kit 0   Blood Glucose Monitoring Suppl (FREESTYLE LITE) DEVI Use as instructed to check sugar 4 times daily 1 each 0   Docusate Sodium (COLACE PO) Take 2 tablets by mouth daily.     empagliflozin (JARDIANCE) 25 MG TABS tablet Take 1 tablet (25 mg total) by mouth daily before breakfast. 90 tablet 3   famotidine (PEPCID) 40 MG tablet Take 40 mg by mouth 2 (two) times daily.     fenofibrate (TRICOR) 145 MG tablet TAKE 1 TABLET BY MOUTH EVERY DAY 90 tablet 0   glucose blood (ACCU-CHEK GUIDE) test strip Use as instructed to check blood sugar 4 times daily. DX:E11.65 300 each 3   hydrOXYzine (ATARAX) 10 MG tablet TAKE 1 TABLET BY MOUTH THREE TIMES A DAY AS NEEDED (Patient taking differently: Take 10 mg by mouth at bedtime.) 270 tablet 0   insulin aspart (NOVOLOG FLEXPEN) 100 UNIT/ML FlexPen Inject 5-20 Units  into the skin in the morning, at noon, in the evening, and at bedtime. 30 mL 3   insulin detemir (LEVEMIR FLEXPEN) 100 UNIT/ML FlexPen Inject 30 Units into the skin daily. 30 mL 2   Insulin Pen Needle (BD PEN NEEDLE NANO U/F) 32G X 4 MM MISC USE 4 TIMES DAILY WITH INSULIN PENS 400 each 1   Lurasidone HCl 120 MG TABS TAKE 1 TABLET DAILY WITH   BREAKFAST (DOSE CHANGE) 90 tablet 1   metFORMIN (GLUCOPHAGE-XR) 500 MG 24 hr tablet Take 2 tablets (1,000 mg total) by mouth 2 (two) times daily with a meal. 360 tablet 3   valsartan (DIOVAN) 160 MG tablet Take 160 mg by mouth daily.     ezetimibe (ZETIA) 10 MG tablet TAKE 1 TABLET (10 MG TOTAL) BY MOUTH DAILY AFTER SUPPER. 90 tablet 0   No current facility-administered medications on file prior to visit.    Allergies  Allergen Reactions  Divalproex Sodium Other (See Comments)    unknown   Methylphenidate Hcl     Aggravate bipolar   Oxycodone Hcl     REACTION: hallucinations   Ozempic (0.25 Or 0.5 Mg-Dose) [Semaglutide(0.25 Or 0.59m-Dos)] Nausea Only   Paroxetine     Aggravate bipolar disorder   Rosuvastatin Other (See Comments)    Myopathy -muscle cramps, elevated CK     DIAGNOSTIC DATA (LABS, IMAGING, TESTING) - I reviewed patient records, labs, notes, testing and imaging myself where available.  Lab Results  Component Value Date   WBC 7.9 05/17/2021   HGB 13.6 05/17/2021   HCT 40.8 05/17/2021   MCV 88.7 05/17/2021   PLT 243 05/17/2021      Component Value Date/Time   NA 139 05/17/2021 1952   K 3.8 05/17/2021 1952   CL 105 05/17/2021 1952   CO2 23 05/17/2021 1952   GLUCOSE 88 05/17/2021 1952   BUN 12 05/17/2021 1952   CREATININE 1.10 05/17/2021 1952   CREATININE 1.24 06/18/2018 1502   CALCIUM 9.2 05/17/2021 1952   PROT 7.5 05/17/2021 1952   ALBUMIN 4.7 05/17/2021 1952   AST 54 (H) 05/17/2021 1952   ALT 45 (H) 05/17/2021 1952   ALKPHOS 65 05/17/2021 1952   BILITOT 0.3 05/17/2021 1952   GFRNONAA >60 05/17/2021 1952    GFRNONAA 84 04/02/2018 1551   GFRAA 97 04/02/2018 1551   Lab Results  Component Value Date   CHOL 225 (H) 02/10/2022   HDL 37 (L) 02/10/2022   LDLCALC 145 (H) 02/10/2022   LDLDIRECT 120.0 04/02/2018   TRIG 237 (H) 02/10/2022   CHOLHDL 4.6 08/06/2020   Lab Results  Component Value Date   HGBA1C 7.3 (A) 11/24/2022   No results found for: "VITAMINB12" Lab Results  Component Value Date   TSH 1.11 10/10/2021    PHYSICAL EXAM:  Today's Vitals   12/08/22 1542  BP: 126/77  Pulse: 75  Weight: 218 lb (98.9 kg)  Height: 5' 10"$  (1.778 m)   Body mass index is 31.28 kg/m.   Wt Readings from Last 3 Encounters:  12/08/22 218 lb (98.9 kg)  11/24/22 225 lb (102.1 kg)  07/15/22 230 lb 9.6 oz (104.6 kg)     Ht Readings from Last 3 Encounters:  12/08/22 5' 10"$  (1.778 m)  11/24/22 5' 9"$  (1.753 m)  07/15/22 5' 9"$  (1.753 m)      General: The patient is awake, alert and appears not in acute distress. The patient is well groomed. Head: Normocephalic, atraumatic. Neck is supple.  Cardiovascular:  Regular rate and cardiac rhythm by pulse,  without distended neck veins. Respiratory: Lungs are clear to auscultation.  Skin:  Without evidence of ankle edema, or rash. Trunk: The patient's posture is erect.   NEUROLOGIC EXAM: The patient is awake and alert, oriented to place and time.   Memory subjective described as intact.  Attention span & concentration ability appears normal.  Speech is fluent,  without  dysarthria, dysphonia or aphasia.  Mood and affect are appropriate.   Cranial nerves: no loss of smell or taste reported  Pupils are equal and briskly reactive to light. Funduscopic exam deferred.  Extraocular movements in vertical and horizontal planes were intact and without nystagmus. No Diplopia. Visual fields by finger perimetry are intact. Hearing was intact to soft voice and finger rubbing.    Facial sensation intact to fine touch.  Facial motor strength is symmetric and  tongue and uvula move midline.  Neck ROM : rotation, tilt  and flexion extension were normal for age and shoulder shrug was symmetrical.    Motor exam:  Symmetric bulk, tone and ROM.   Normal tone without cog wheeling, symmetric grip strength .        ASSESSMENT AND PLAN 47 y.o. year old male  here with:  Excellent resolution  of OSA , REM sleep dependent on auto titration CPAP.  Feels much better since starting  CPAP, better controlled HTN, DM and more exercise , less fatigue.  He has no longer RLS and he lost 20 pounds Body weight.     1) OSA on CPAP is well controlled, he is a highly compliant user. Needs a FFM and supplies replaced.   2) No more RLS on magnesium , may have been caused by Latuda- Also no more fatigue, no more headaches and no nocturia- solved by CPAP   3) RV in 12 months with NP.         I plan to follow up either personally or through our NP within 12 months.   I would like to thank Cody Bien, Short and Cody Bien, Short [address] [place],  [place] [address] for allowing me to meet with and to take care of this pleasant patient.   CC: I will share my notes with pcp.  After spending a total time of  19  minutes face to face and additional time for physical and neurologic examination, review of laboratory studies,  personal review of imaging studies, reports and results of other testing and review of referral information / records as far as provided in visit,   Electronically signed by: Cody Seat, Short 12/08/2022 4:04 PM  [facility] Board certified by The [employer] of Sleep Medicine and Diplomate of the [employer] of Sleep Medicine. Board certified In Neurology through the [place], Fellow of the [employer] of Neurology. Medical Director of [facility].

## 2022-12-08 NOTE — Patient Instructions (Signed)
KEEPING IT CLEAN: CPAP HYGIENE PROPER UPKEEP OF YOUR CPAP MACHINE CAN HELP ENSURE THE DEVICE FUNCTIONS PROPERLY CPAP CLEANING INSTRUCTIONS Along with proper CPAP cleaning it is recommended that you replace your mask, tubing and filters once very 3 months and more frequently if you are sick.   DAILY CLEANING Do not use moisturizing soaps, bleach, scented oils, chlorine, or alcohol-based solutions to clean your supplies. These solutions may cause irritation to your skin and lungs and may reduce the life of your products. [employer] or Comparable works best for daily cleaning.  **If you've been sick, it's smart to wash your mask, tubing, humidifier and filter daily until your cold, flu or virus symptoms are gone. That can help reduce the amount of time you spend under the weather.  Before using your mask -wash your face daily with soap and water to remove excess facial oils. Wipe down your mask (including areas that come in contact with your skin) using a damp towel with soap and warm water. This will remove any oils, dead skin cells, and sweat on the mask that can affect the quality of the seal. Gently rinse with a clean towel and let the mask air-dry out of direct sunlight. You can also use unscented baby wipes or pre-moistened towels designed specifically for cleaning CPAP masks, which are available on-line. DO NOT USE CLOROX OR DISINFECTING WIPES. If your unit has a humidifier, empty any leftover water instead of letting in sit in the unit all day. Refill the humidifier with clean, distilled water right before bedtime for optimal use WEEKLY (OR MORE FREQUENT) CLEANING Your mask and tubing need a full bath at least once a week to keep it free of dust, bacteria, and germs. (During COVID-19 or any other flu/virus we recommend more frequent cleaning) Clean the CPAP tubing, nasal mask, and headgear in a bathroom sink filled with warm water and a few drops of ammonia-free, mild dish detergent. Avoid  using stronger cleaning products, as they may damage the mask or leave harmful residue. Swirl all parts around for about five minutes, rinse well and let air dry during the day. Hang the tubing over the shower rod, on a towel rack or in the laundry room to ensure all the water drips out. The mask and headgear can be air-dried on a towel or hung on a hook or hanger. You should also wipe down your CPAP machine with a damp cloth. Ensure the unit is unplugged. The towel shouldn't be too damp or wet, as water could get into the machine. Clean the filter by removing it and rinsing it in warm tap water. Run it under the water and squeeze to make sure there is no dust. Then blot down the filter with a towel. Do not wash your machine's white filter, if one is present--those are disposable and should be replaced every two weeks. If you are recovering from being sick, we recommend changing the filter sooner. If your CPAP has a humidifier, that also needs to be cleaned weekly. Empty any remaining water and then wash the water chamber in the sink with warm soapy water. Rinse well and drain out as much of the water as possible. Let the chamber air-dry before placing it back into the CPAP unit. Every other week you should disinfect the humidifier. Do that by soaking it in a solution of one-part vinegar to five parts water for 30 minutes, thoroughly rinsing and then placing in your dishwasher's top rack for washing. And keep  it clean by using only distilled water to prevent mineral deposits that can build up and cause damage to your machine. IMPORTANT TIPS Make caring for your CPAP equipment part of your morning routine. Keep machine and accessories out of direct sunlight to avoid damaging them. Never use bleach to clean accessories. Place machine on a level surface and away from curtains that may interfere with the air intake. Keep track of when you should order replacement parts for your mask and accessories so that  you always get the most out of your CPAP. You can also sign up for [employer] by contacting our DME department at [email or web address] **The following are examples of soap that may be used: [employer], [place] soap (plain).  With a little upkeep, your CPAP can continue to help you breathe better for a long time. Just a few minutes a day can help keep your CPAP running efficiently for years to come.  If you have a CPAP, but are struggling with compliance, check out our no mask oral appliance, for those with mild to moderate sleep apnea.  CPAP stands for "continuous positive airway pressure." With CPAP, the amount of pressure stays the same while you breathe in (inhale) and out (exhale). BIPAP stands for "bi-level positive airway pressure." With BIPAP, the amount of pressure will be higher when you inhale and lower when you exhale. This allows you to take larger breaths. CPAP or BIPAP may be used in the hospital, or your health care provider may want you to use it at home. You may need to have a sleep study before your health care provider can order a machine for you to use at home. What are the advantages? CPAP or BIPAP can be helpful if you have: Sleep apnea. Chronic obstructive pulmonary disease (COPD). Heart failure. Medical conditions that cause muscle weakness, including muscular dystrophy or amyotrophic lateral sclerosis (ALS). Other problems that cause breathing to be shallow, weak, abnormal, or difficult. CPAP and BIPAP are most commonly used for obstructive sleep apnea (OSA) to keep the airways from collapsing when the muscles relax during sleep. What are the risks? Generally, this is a safe treatment. However, problems may occur, including: Irritated skin or skin sores if the mask does not fit properly. Dry or stuffy nose or nosebleeds. Dry mouth. Feeling gassy or bloated. Sinus or lung infection if the equipment is not cleaned properly. When should CPAP or  BIPAP be used? In most cases, the mask only needs to be worn during sleep. Generally, the mask needs to be worn throughout the night and during any daytime naps. People with certain medical conditions may also need to wear the mask at other times, such as when they are awake. Follow instructions from your health care provider about when to use the machine. What happens during CPAP or BIPAP?  Both CPAP and BIPAP are provided by a small machine with a flexible plastic tube that attaches to a plastic mask that you wear. Air is blown through the mask into your nose or mouth. The amount of pressure that is used to blow the air can be adjusted on the machine. Your health care provider will set the pressure setting and help you find the best mask for you. Tips for using the mask Because the mask needs to be snug, some people feel trapped or closed-in (claustrophobic) when first using the mask. If you feel this way, you may need to get used to the mask. One way to do  this is to hold the mask loosely over your nose or mouth and then gradually apply the mask more snugly. You can also gradually increase the amount of time that you use the mask. Masks are available in various types and sizes. If your mask does not fit well, talk with your health care provider about getting a different one. Some common types of masks include: Full face masks, which fit over the mouth and nose. Nasal masks, which fit over the nose. Nasal pillow or prong masks, which fit into the nostrils. If you are using a mask that fits over your nose and you tend to breathe through your mouth, a chin strap may be applied to help keep your mouth closed. Use a skin barrier to protect your skin as told by your health care provider. Some CPAP and BIPAP machines have alarms that may sound if the mask comes off or develops a leak. If you have trouble with the mask, it is very important that you talk with your health care provider about finding a way to  make the mask easier to tolerate. Do not stop using the mask. There could be a negative impact on your health if you stop using the mask. Tips for using the machine Place your CPAP or BIPAP machine on a secure table or stand near an electrical outlet. Know where the on/off switch is on the machine. Follow instructions from your health care provider about how to set the pressure on your machine and when you should use it. Do not eat or drink while the CPAP or BIPAP machine is on. Food or fluids could get pushed into your lungs by the pressure of the CPAP or BIPAP. For home use, CPAP and BIPAP machines can be rented or purchased through home health care companies. Many different brands of machines are available. Renting a machine before purchasing may help you find out which particular machine works well for you. Your health insurance company may also decide which machine you may get. Keep the CPAP or BIPAP machine and attachments clean. Ask your health care provider for specific instructions. Check the humidifier if you have a dry stuffy nose or nosebleeds. Make sure it is working correctly. Follow these instructions at home: Take over-the-counter and prescription medicines only as told by your health care provider. Ask if you can take sinus medicine if your sinuses are blocked. Do not use any products that contain nicotine or tobacco. These products include cigarettes, chewing tobacco, and vaping devices, such as e-cigarettes. If you need help quitting, ask your health care provider. Keep all follow-up visits. This is important. Contact a health care provider if: You have redness or pressure sores on your head, face, mouth, or nose from the mask or head gear. You have trouble using the CPAP or BIPAP machine. You cannot tolerate wearing the CPAP or BIPAP mask. Someone tells you that you snore even when wearing your CPAP or BIPAP. Get help right away if: You have trouble breathing. You feel  confused. Summary CPAP and BIPAP are methods that use air pressure to keep your airways open and to help you breathe well. If you have trouble with the mask, it is very important that you talk with your health care provider about finding a way to make the mask easier to tolerate. Do not stop using the mask. There could be a negative impact to your health if you stop using the mask. Follow instructions from your health care provider about when to  use the machine. This information is not intended to replace advice given to you by your health care provider. Make sure you discuss any questions you have with your health care provider. Document Revised: 05/22/2021 Document Reviewed: 09/21/2020 Elsevier Patient Education  [place] Sleep Information, Adult Quality sleep is important for your mental and physical health. It also improves your quality of life. Quality sleep means you: Are asleep for most of the time you are in bed. Fall asleep within 30 minutes. Wake up no more than once a night. Are awake for no longer than 20 minutes if you do wake up during the night. Most adults need 7-8 hours of quality sleep each night. How can poor sleep affect me? If you do not get enough quality sleep, you may have: Mood swings. Daytime sleepiness. Decreased alertness, reaction time, and concentration. Sleep disorders, such as insomnia and sleep apnea. Difficulty with: Solving problems. Coping with stress. Paying attention. These issues may affect your performance and productivity at work, school, and home. Lack of sleep may also put you at higher risk for accidents, suicide, and risky behaviors. If you do not get quality sleep, you may also be at higher risk for several health problems, including: Infections. Type 2 diabetes. Heart disease. High blood pressure. Obesity. Worsening of long-term conditions, like arthritis, kidney disease, depression, Parkinson's disease, and  epilepsy. What actions can I take to get more quality sleep? Sleep schedule and routine Stick to a sleep schedule. Go to sleep and wake up at about the same time each day. Do not try to sleep less on weekdays and make up for lost sleep on weekends. This does not work. Limit naps during the day to 30 minutes or less. Do not take naps in the late afternoon. Make time to relax before bed. Reading, listening to music, or taking a hot bath promotes quality sleep. Make your bedroom a place that promotes quality sleep. Keep your bedroom dark, quiet, and at a comfortable room temperature. Make sure your bed is comfortable. Avoid using electronic devices that give off bright blue light for 30 minutes before bedtime. Your brain perceives bright blue light as sunlight. This includes television, phones, and computers. If you are lying awake in bed for longer than 20 minutes, get up and do a relaxing activity until you feel sleepy. Lifestyle     Try to get at least 30 minutes of exercise on most days. Do not exercise 2-3 hours before going to bed. Do not use any products that contain nicotine or tobacco. These products include cigarettes, chewing tobacco, and vaping devices, such as e-cigarettes. If you need help quitting, ask your health care provider. Do not drink caffeinated beverages for at least 8 hours before going to bed. Coffee, tea, and some sodas contain caffeine. Do not drink alcohol or eat large meals close to bedtime. Try to get at least 30 minutes of sunlight every day. Morning sunlight is best. Medical concerns Work with your health care provider to treat medical conditions that may affect sleeping, such as: Nasal obstruction. Snoring. Sleep apnea and other sleep disorders. Talk to your health care provider if you think any of your prescription medicines may cause you to have difficulty falling or staying asleep. If you have sleep problems, talk with a sleep consultant. If you think you  have a sleep disorder, talk with your health care provider about getting evaluated by a specialist. Where to find more information Sleep Foundation: sleepfoundation.[place] of  Sleep Medicine: aasm.org Centers for Disease Control and Prevention (CDC): StoreMirror.com.cy Contact a health care provider if: You have trouble getting to sleep or staying asleep. You often wake up very early in the morning and cannot get back to sleep. You have daytime sleepiness. You have daytime sleep attacks of suddenly falling asleep and sudden muscle weakness (narcolepsy). You have a tingling sensation in your legs with a strong urge to move your legs (restless legs syndrome). You stop breathing briefly during sleep (sleep apnea). You think you have a sleep disorder or are taking a medicine that is affecting your quality of sleep. Summary Most adults need 7-8 hours of quality sleep each night. Getting enough quality sleep is important for your mental and physical health. Make your bedroom a place that promotes quality sleep, and avoid things that may cause you to have poor sleep, such as alcohol, caffeine, smoking, or large meals. Talk to your health care provider if you have trouble falling asleep or staying asleep. This information is not intended to replace advice given to you by your health care provider. Make sure you discuss any questions you have with your health care provider. Document Revised: 02/05/2022 Document Reviewed: 02/05/2022 Elsevier Patient Education  [place].

## 2022-12-09 ENCOUNTER — Telehealth: Payer: Self-pay | Admitting: Psychiatry

## 2022-12-09 NOTE — Telephone Encounter (Signed)
Sent MyChart message and LVM that message had been sent.

## 2022-12-09 NOTE — Telephone Encounter (Signed)
Pt LVM @ 1:04p.  He said is getting manic. He is extremely irritable and angry.  He wants to know what he can do.  Next appt 4/10

## 2022-12-10 NOTE — Telephone Encounter (Signed)
I'm out of town.  Only option I can give now is to increase the Latuda to 1 and 1/2 of the 120 mg tabs temporarily until mania resolves

## 2022-12-10 NOTE — Telephone Encounter (Signed)
Patient is taking hydroxyzine and Latuda. He says he takes as prescribed, taking with enough calories. There was a work situation that caused a lot of stress and he was able to talk to someone to calm him down and provide clarity on the situation. He said that he spent $1000 on a gambling game that didn't even provide any pay out. He also said he got into an argument with 2 different customers and that he doesn't usually do this. He is also describing depression. He said he is sleeping 9 hours a day. Patient does not have pressured or rapid speech, seems to have a calm affect, but is concerned.

## 2022-12-10 NOTE — Telephone Encounter (Signed)
Patient notified of recommendations. Information also sent via [place]. He has an appt with Gerald Stabs tomorrow.

## 2022-12-11 ENCOUNTER — Ambulatory Visit (INDEPENDENT_AMBULATORY_CARE_PROVIDER_SITE_OTHER): Payer: 59 | Admitting: Mental Health

## 2022-12-11 DIAGNOSIS — F3110 Bipolar disorder, current episode manic without psychotic features, unspecified: Secondary | ICD-10-CM

## 2022-12-11 NOTE — Progress Notes (Signed)
[facility] Note  Name: Cody Short Date: 12/11/22 MRN: [id number] DOB: Jan 11, 1976 PCP: Fanny Bien, MD  Time spent:  51 minutes  Treatment:   ind. Therapy    Mental Status Exam:    Appearance:    Casual     Behavior:   Appropriate  Motor:   WNL  Speech/Language:    Clear and Coherent  Affect:   Full range   Mood:   Euthymic, anxious  Thought process:   Logical, linear, goal directed  Thought content:     WNL  Sensory/Perceptual disturbances:     none  Orientation:   x4  Attention:   Good  Concentration:   Good  Memory:   Intact  Fund of knowledge:    Consistent with age and development  Insight:     Good  Judgment:    Good  Impulse Control:   Good     Reported Symptoms:  anxiety, rumination, intermittent depressed mood, impulsivity  Risk Assessment: Danger to Self:  No Self-injurious Behavior: No Danger to Others: No Duty to Warn:no Physical Aggression / Violence:No  Access to Firearms a concern: No  Gang Involvement:No  Patient / guardian was educated about steps to take if suicide or homicide risk level increases between visits: yes While future psychiatric events cannot be accurately predicted, the patient does not currently require acute inpatient psychiatric care and does not currently meet [facility] involuntary commitment criteria.  Subjective:  Patient engaged in telehealth session.  Assessed progress.  Patient shared ongoing challenges still thinking about the girl from several months ago with him he works.  Explored his follow through with trying to utilize thought blocking as discussed in previous sessions and reframing with a focus on identifying specific thoughts with which to remind himself to continue to work on managing his tendency to ruminate.  He was able to identify a few thoughts in session related to how he does not have contact with her which has been true for the last several months, how she is significantly  younger than he is which he feels is also a barrier and also that she is dating someone else.  Other issues related to family were identified, his concerns about his brother's ongoing drug use and stress he puts on the family specifically his parents.  Provide support and understanding as he processed feelings related.  Explored ways to set some interpersonal boundaries to avoid this increasing his own stress levels where patient identified acknowledging what he can and cannot control, meaning his brother has to make his own decisions as well as his own parents in terms of how they provide ongoing support.  Interventions:  motivational interviewing, CBT    Diagnoses:    ICD-10-CM   1. Bipolar I disorder, most recent episode (or current) manic ([place])  F31.10             Plan: Patient to utilize coping skills as discussed, continue his medication compliance to maintain mood stability.  Long-term goals:   Maintain symptom reduction: The patient will report sustained reduction in symptoms of anxiety using both CBT and mindfulness interventions for 3 consecutive months progressively.  Improve emotional regulation: The patient will learn and apply CBT and mindfulness-based strategies to regulate emotions, such as mindfulness-based stress reduction and cognitive restructuring, and report an improvement in emotional regulation for at least 3 consecutive months progressively.    Short-term goal:  The patient will learn and apply CBT and mindfulness-based coping skills for managing  anxiety and practice using it between sessions.       2.   The patient will CBT and mindfulness-based interventions to increase awareness of negative thought patterns. 3.   The patient will keep and maintain employment to keep financial stress manageable       4.   The patient will decrease anxiety specifically when driving.         [place], [facility]

## 2022-12-24 ENCOUNTER — Encounter: Payer: Self-pay | Admitting: Neurology

## 2023-01-06 ENCOUNTER — Telehealth: Payer: Self-pay | Admitting: Psychiatry

## 2023-01-06 NOTE — Telephone Encounter (Addendum)
Pt called left message stating he go into an argument with customer 5 days after starting Lurasidone 120 mg asking if he should continue taking? He doesn't think he was manic just irritated with customer. Contact # [phone]

## 2023-01-06 NOTE — Telephone Encounter (Signed)
Patient called 2/13 c/o of similar symptoms. At that time Latuda was increased to 180 mg. The latest event occurred 5 days after the increase. He denied any manic behavior, denied gambling since February note. Rates depression as 7/10, anxiety as 8/10 due to having to deal with his brother and grandmother. He only takes the hydroxyzine at night because it makes him drowsy.   He had a visit with Gerald Stabs on 2/15. He has an appt with Gerald Stabs 3/14 and an appt with you on 4/10

## 2023-01-06 NOTE — Telephone Encounter (Signed)
Anyone can lose their temper at times.  We should not change the dosage.

## 2023-01-07 NOTE — Telephone Encounter (Signed)
LVM with info per DPR.  

## 2023-01-07 NOTE — Telephone Encounter (Signed)
Pt LVM @ 9:27a.  He said he got Bridget's voice mail, but he needs to know if he should stay on '120mg'$  or go to '180mg'$ .  He is going to run out of meds soon if he stays on '180mg'$ ..  Next appt 3/14

## 2023-01-07 NOTE — Telephone Encounter (Signed)
Per Dr. Casimiro Needle instructions previously, he was to increase Latuda to 1-1/2 tabs until mania resolved. He will drop back down to 120. He will run out of medication sooner due to increasing. He has a 30-day supply left. Told him to call when he has a week left and we could send in a Rx.

## 2023-01-08 ENCOUNTER — Ambulatory Visit (INDEPENDENT_AMBULATORY_CARE_PROVIDER_SITE_OTHER): Payer: 59 | Admitting: Mental Health

## 2023-01-08 DIAGNOSIS — F3110 Bipolar disorder, current episode manic without psychotic features, unspecified: Secondary | ICD-10-CM | POA: Diagnosis not present

## 2023-01-08 NOTE — Progress Notes (Signed)
[facility] Note  Name: Cody Short Date:  01/08/23 MRN: [id number] DOB: [birth year] PCP: Fanny Bien, MD  Time spent:  50 minutes  Treatment:   ind. Therapy    Mental Status Exam:    Appearance:    Casual     Behavior:   Appropriate  Motor:   WNL  Speech/Language:    Clear and Coherent  Affect:   Full range   Mood:   Euthymic, anxious  Thought process:   Logical, linear, goal directed  Thought content:     WNL  Sensory/Perceptual disturbances:     none  Orientation:   x4  Attention:   Good  Concentration:   Good  Memory:   Intact  Fund of knowledge:    Consistent with age and development  Insight:     Good  Judgment:    Good  Impulse Control:   Good     Reported Symptoms:  anxiety, rumination, intermittent depressed mood, impulsivity  Risk Assessment: Danger to Self:  No Self-injurious Behavior: No Danger to Others: No Duty to Warn:no Physical Aggression / Violence:No  Access to Firearms a concern: No  Gang Involvement:No  Patient / guardian was educated about steps to take if suicide or homicide risk level increases between visits: yes While future psychiatric events cannot be accurately predicted, the patient does not currently require acute inpatient psychiatric care and does not currently meet [facility] involuntary commitment criteria.  Subjective:  Patient engaged in telehealth session.  Assessed progress. The patient continues to struggle with anxiety, particularly with obsessive and compulsive thinking patterns revolving around a past relationship with a girl he worked with several months ago. He reports persistent rumination about the relationship despite recognizing its unrealistic nature and identifying various barriers, including the lack of contact for several months, significant age difference, and potential substance abuse issues on her part. The patient also acknowledges a pattern of obsessing over women and other  activities throughout the years. patient demonstrated insight into his obsessive thinking patterns and actively engaged in identifying and challenging his thoughts from previous sessions. He acknowledged the significant progress he has made in his professional life, earning increasing responsibilities at work. Initially, he tended to give credit to others for his achievements, but he is now recognizing his own contributions and growth. We explored strategies to manage obsessive thinking, including grounding techniques to anchor himself in the present moment. The patient was encouraged to assess the realism and healthiness of his thoughts and desires, particularly in relation to relationships. Problem-solving techniques were also utilized to address the patient's anxiety related to driving, a separate source of distress for him.    Interventions:  motivational interviewing, CBT    Diagnoses:    ICD-10-CM   1. Bipolar I disorder, most recent episode (or current) manic ([place])  F31.10              Plan: Patient to utilize coping skills as discussed, continue his medication compliance to maintain mood stability. patient will continue to work on identifying and challenging his obsessive thoughts, particularly regarding relationships, and practicing grounding techniques to manage his anxiety. He will also be encouraged to celebrate his achievements at work and continue to take ownership of his successes.  Long-term goals:   Maintain symptom reduction: The patient will report sustained reduction in symptoms of anxiety using both CBT and mindfulness interventions for 3 consecutive months progressively.  Improve emotional regulation: The patient will learn and apply CBT and mindfulness-based strategies  to regulate emotions, such as mindfulness-based stress reduction and cognitive restructuring, and report an improvement in emotional regulation for at least 3 consecutive months progressively.     Short-term goal:  The patient will learn and apply CBT and mindfulness-based coping skills for managing anxiety and practice using it between sessions.       2.   The patient will CBT and mindfulness-based interventions to increase awareness of negative thought patterns. 3.   The patient will keep and maintain employment to keep financial stress manageable       4.   The patient will decrease anxiety specifically when driving.         [place], [facility]

## 2023-01-09 ENCOUNTER — Other Ambulatory Visit: Payer: Self-pay | Admitting: Internal Medicine

## 2023-01-09 DIAGNOSIS — E1165 Type 2 diabetes mellitus with hyperglycemia: Secondary | ICD-10-CM

## 2023-01-13 DIAGNOSIS — G4733 Obstructive sleep apnea (adult) (pediatric): Secondary | ICD-10-CM | POA: Diagnosis not present

## 2023-01-14 DIAGNOSIS — J309 Allergic rhinitis, unspecified: Secondary | ICD-10-CM | POA: Diagnosis not present

## 2023-01-14 DIAGNOSIS — I10 Essential (primary) hypertension: Secondary | ICD-10-CM | POA: Diagnosis not present

## 2023-01-14 DIAGNOSIS — E781 Pure hyperglyceridemia: Secondary | ICD-10-CM | POA: Diagnosis not present

## 2023-01-14 DIAGNOSIS — L709 Acne, unspecified: Secondary | ICD-10-CM | POA: Diagnosis not present

## 2023-02-04 ENCOUNTER — Ambulatory Visit (INDEPENDENT_AMBULATORY_CARE_PROVIDER_SITE_OTHER): Payer: 59 | Admitting: Psychiatry

## 2023-02-04 ENCOUNTER — Encounter: Payer: Self-pay | Admitting: Psychiatry

## 2023-02-04 DIAGNOSIS — F1211 Cannabis abuse, in remission: Secondary | ICD-10-CM

## 2023-02-04 DIAGNOSIS — F5105 Insomnia due to other mental disorder: Secondary | ICD-10-CM | POA: Diagnosis not present

## 2023-02-04 DIAGNOSIS — F4001 Agoraphobia with panic disorder: Secondary | ICD-10-CM | POA: Diagnosis not present

## 2023-02-04 DIAGNOSIS — F411 Generalized anxiety disorder: Secondary | ICD-10-CM

## 2023-02-04 DIAGNOSIS — F3110 Bipolar disorder, current episode manic without psychotic features, unspecified: Secondary | ICD-10-CM | POA: Diagnosis not present

## 2023-02-04 DIAGNOSIS — G251 Drug-induced tremor: Secondary | ICD-10-CM | POA: Diagnosis not present

## 2023-02-04 MED ORDER — LURASIDONE HCL 120 MG PO TABS: ORAL_TABLET | ORAL | 1 refills | Status: DC

## 2023-02-04 NOTE — Progress Notes (Signed)
Cody Short [id number] 1976/08/20 47 y.o.    Subjective:   Patient ID:  Cody Short is a 47 y.o. (DOB 1976/08/20) male.  Chief Complaint:  Chief Complaint  Patient presents with   Follow-up    Mood and anxiety   Medication Reaction   Sleeping Problem     Cody Short presents to the office today for follow-up of several psychiatric diagnoses ...    seen August 2020.  For obsessive anxiety  Switched Lexapro 5 mg daily on May 18.  But he stated it made him hungry and so it was stopped in August.  He continued on Saphris 10 mg nightly, lithium 1200 mg daily, propranolol as needed tremor.  Lithium level was ordered.  He called Sept saying he was manic.  Had a lithium level 0.7 and so dose was increased to 1500 mg daily.    seen December 21, 2019.  He had some ongoing manic symptoms and Saphris was increased to 15 mg nightly.  02/07/2020 appointment with the following noted: He has made multiple phone calls to the office since that date.  He decided he wanted to stop lithium because of urinary problems.  He was warned this could lead to mania but he wanted to pursue it anyway.  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  Cody Short had been having bedwetting episodes which she attributed to the lithium.  He has not consistently taken Saphris 15 mg at bedtime since the office visit in February. He completely stopped lithium January 30, 2020 U frequency is much better and less thirsty.  Bedwetting resolved.  Reduced BM to 1-2 times daily.  Gassy.  Glucose is high.  B is hospitalized with unknown disease.  Insulin adjusted. Mental status is best it's ever been.  Less mania.  Less obsessing.  No worsening mania. Is taking Saphris 15 mg HS> Plan no med changes.  04/10/2020 appointment with the following noted: No longer on lithium.  Still frequent urination and thirst DT DM poorly controlled despite meds.  Endo says  little else to take bc history of pancreatitis.  Says he goes to urinate q 15 mins. Plan: Wean Saphris in crossover to loxapine 150 mg HS over 2 weeks bc he and endocrinologist believe it's likely Saphris is cause of the inability to get his glucose to acceptable levels.   06/07/20 appt with the following noted: He's called a couple of times CO sleepiness and reduced loxapine to 50 mg HS. Started Vegan diet and BS is better 4 weeks ago.  Thinks glucose is better off the Saphris. Lost 6 #.   CO sleepiness and sleep 16 hours   He thinks it also drove up pulse and blood pressure. Asks about taking Saphris 10 and a second medication. Mood has been fine.   Plan: He wants to restart Saphris 10 mg HS and use a 2nd mood stabilizer. Start Saphris and reduce Loxapine  To 10 mg at night DT prior failure of 10 Mg Saphris with lithium.  06/27/2020 phone call from patient: Pt called to report Loxapine & Saphris is causing his BP to be very high and Saphris is causing blood sugar to be high. Apt 10/18. Need changes before then. Call back @ [phone]  MD response: He had wanted to go back on Saphris and reduce loxapine which we did.  Tell him to stop the Saphris and increase loxapine to 20 mg nightly.  He did not tolerate  50 mg of loxapine very well. Nurse TC withpt: Spoke with Cody Short and he will stop the Saphris, but feels the increase in the Loxapine to 20 mg will increase his blood sugars more. Explained that with coming off the Saphris he needs to increase it a bit or he will not be stable with his moods. Advised him to stop the Saphris and would check with Dr. Jennelle Human about any other recommendations.   07/17/20 appt with the following noted: Doing terrible.  Needs melatonin to sleep.  Claims loxapine is elevating BP, pulse, TG, cholesterol, glucose.  Claimed Saphris did it too.   Sees PCP at end of October.   Started Vegan diet about 6 weeks ago and exercising.   A/P: There are no available mood stabilizers  that do not have a warning of elevating blood sugar and cholesterol.  He has tried all the mood stabilizers that do not have this warning.  All of the available mood stabilizers left that have any effectiveness are antipsychotics and the FDA has required a class warning on all antipsychotics about blood sugar and cholesterol effects.  This is true despite the fact that not all antipsychotics are equal at increasing these risks.  The patient's reading of these potential side effects is complicating finding an effective treatment.  The only way to prove whether or not these meds are elevating his blood sugar and cholesterol is to stop the medications for a period of time.  This exposes him to manic risk but there is no chance of finding an adequate mood stabilizer that will satisfy him until we can prove whether or not these meds are in fact affecting his blood sugar, cholesterol, blood pressure, and pulse. Therefore he will stop the loxapine which is at a low dose. We will check labs in 3 weeks.  We will attempt follow-up in 4 weeks.  If he gets manic he will let us know.   08/13/2020 appointment with the following noted: Blood sugar is pretty much the same off the loxapine but BP and pulse improved from 110 to 70 range.  But had started BP med about mid September. Started counselor Illene Labrador at [facility] and started nutritionsist.  Has changed and restricted diet. Sees PCP 07/24/20.  Endo is managing DM.   Mood has been really good.  Counseling helped the anxiety.  Getting 10 hours of sleep.  PCP suggested changing time going to bed earlier and it's helping.   Sometimes has racing thoughts and gets comments from others while playing video games that he needs to take a break. Patient reports stable mood and deniesr irritable moods.  Denies appetite disturbance.  Patient reports that energy and motivation have been good.  Patient denies any difficulty with concentration.  Patient  denies any suicidal ideation.  Admits he's easily stressed.   10/09/20 appt with following noted: Getting manic as hell and depressed as hell. Talking too much and obsessing over things.  Talking too loud and doing things extremely fast.  Only depressed a couple of days ago.  Manic sx are brief and not all the time. Sleep 7-8 hours and sleep schedule is more normal. Stayed on Caplyta and pleased overall with SE. BS is better but hypertension is ongoing. Low TSH. Still counseling. No delusions hallucinations since off drugs. Give samples of Caplyta 42 mg once daily. Prefer no change befor ethe holidays bc he is not continuously manic or depressed. Likely switch to Jordan after the holidays.  11/19/2020 appointment  with the following noted: Best I've ever been except some controllable manic.  Able to do things fast and easily.  Anxiety medicine and counseling has helped.  Better function.  No SE. Not depressed in a while.  But not sure the Capylta helps mania.   Lost from 230# to 189# with diet and exercise over a long period.   Sleep 4-8 hours, but not that I can't sleep but don't feel like sleeping.  Not itching any more.  I'm a a whole new person.   I can't keep up the mania.  Driving my parents and friends crazy. Blood sugar better and off insulin.  Only taking metformin. Can't afford Caplyta. So feels he must change. Hydroxyzine really helped anxiety. Plan: DC Caplyta DT mania.  switch to Latuda 40 then 80 mg with food.  Pick up samples.  12/03/2020 phone call from parents:Parents called and want dr. Jennelle Human to know that Cody Short is maniac and he is having physical aggression and all he does is play video games 24 hours a day and is not sleeping. Parents feel like Cody Short is not giving you the whole picture and needs an adjustment to his medicine. MD response:atient acknowledged that his appointment on January 24 that he was manic while taking Caplyta.  He also acknowledged that his family saw him  as being manic.  We discontinued Caplyta and switched him to Jordan.  If he made the switch and increase the dose in a timely manner he should have been on 80 mg daily for a week now.  His parents are complaining that he is still manic.  Provided he is tolerating the Latuda reasonably well have him increase to 120 mg daily.  He can take 1-1/2 of the 80 mg tablets until he runs out and then we can send in a refill for 120 mg daily.  Remind him he needs to take that with at least 350 cal. Phone call with patient from nurse: Patient rtc and he did report he's doing great on the medication but is still having mania. Reports staying up all night playing video games then asleep about 5 am and back up at noon. He is taking medication with 350 calories also. Advised him to increase to 120 mg Latuda daily. He reports having only 40 mg tablets at home so instructed him to take 3 tablets and I would get more samples out for him to pick up tomorrow. He agreed and verbalized understanding.  12/07/2020 phone call: Patient called again about his Latuda. He states that it makes him really hungry after he takes it, he takes a nap and wakes up hungry again. He would like to know would it be okay to take before bed instead. MD response: He is unlikely to comply with recommendations about routine sleep and wake times.  He is in a habit of playing video games late into the night.  If he prefers he can split the dosage of the Latuda between breakfast and another meal.  But it is very important that he get the full dose in every day and take it with 350 cal. Nursing phone call with patient:Rtc to patient and he reports he has to take it all at hs because it makes him sleepy, I advised him to continue that regimen. He also reports not sleeping well but he's been drinking caffeine later in the day. Advised him to not drink any caffeine past 2-3 pm. He agreed. He reports his blood sugars have been elevated  some, not as bad but it is up  some.   12/20/2020 appointment with the following noted: Mania tremendously better but occ anger outbursts anger.  6 hours of sleep with melatonin, hydroxyzine and Latuda 120 mg for 2 weeks.  Staying up all night playing video games. Kasandra Knudsen does make him hungry right after he takes it so takes it before bedtime.  Taking with food. Hyperactivity and loud talking is better.  I can function fast.  Parents see benefit with mania but are concerned about his anger outbursts but he things she takes stress out on him.  Blood sugar is generally good except when eats too much.  Taking it with dinner and HS.   Able to drive around town.  Can cook and clean.  Less ruminating on his past.  Less need for direction.   Counseling is helping at [facility] counseling. Hydroxyzine helped itching and anxiety.  Plan: Continue Latuda 120 mg PM.  Is getting benefit but only been on it for a couple of weeks.   01/17/2021 appointment with following noted: So so.  Some anger outburts a couple of times.  Eats too much.  Blood sugar is pretty bad.  Up at night playing video games and then sleeps too late in the day.  Takes Latuda at evening meal.  Wants to blame meds for these behaviors. Otherwise mood is "good I guess".  No serious highs or lows.  Can get in arguments with mother when she tries to correct him and may feel guilty afterwards. Takes hydroxyzine q 6 hours because of anxiety and bc dx thyroid inflamed, thyroiditis.  Asks if there's anything to help with overeating. Lost weight from thyroid.   Plan: Continue Latuda 120 mg as he is tolerating it and it appears to be controlling mania better than did the Caplyta and he is tolerating it better.  02/12/2021 appointment with the following noted: Perfectly good with mood and depression.  Needs hydroxyzine for itching and anxiety.  Energy poor with thyroiditis. Will sleep too much too bc gets bored and no physical activity. Varies 9-16 hours daily.  Can nap easily.   Not spacy.   No anger lately. Tolerating Latuda well.   Still eats too many sweets late at night and gets high blood sugars.   Plan: Increase  topiramate 50 mg BID off label for weight loss and disc SE.  He wants to try something.  03/20/2021 appointment with the following noted: Taking meds.  A little benefit with increase topiramate. Mood is perfectly fine without mania nor depression. Blood sugar is a bit high. Claims doctor thinks rash could be related to [facility] but he sleeps all the time bc hypothyroidism. Has folliculitis Started treatment for it and it is helping.   Thyroid problem is a wait and see.  Could take a year to get better. Doesn't think topiramate added tiredness. Not taking hydroxyzine much about once daily. Dealing with difficult GM threatening sui if left alone.  Plan: Continue Latuda 120 mg PM.  Is getting benefit.. Better tolerated than others so far but he does say it makes him hungry but he has chronic impulse control problems including overeating.  05/06/2021 appointment with the following noted: Quit topiramate DT nausea and not helping at all. Rash resolved. Lipids not terrible but a little up.  TSH normalized. HGBA1C 9.3 Not exercising. Not doing much re: work etc other than video games. Not manic lately. Patient reports stable mood and denies depressed or irritable moods.  Patient  denies any recent difficulty with anxiety.   Denies appetite disturbance.  Patient reports that energy and motivation have been good.  Patient denies any difficulty with concentration.  Patient denies any suicidal ideation. Energy is better than it was but still sleeping 12 hours daily and up at night playing videos with friends. Plan: Reduce Latuda to 80 mg to reduce excessive sleepiness.  If mania then increase it back up 120 mg PM.  Is getting benefit..  06/20/21 TC:  Pt stated the cramps have spread to his arms and legs.He has been on latuda 80 mg qd for awhile now.He stated he  is also going to see a cardiologist due to heart spasms. MD response:  The cramps are not likely related to Jordan.  Usually it is related to dehydration although I see from chart review that he had elevated creatinine kinase from a statin and that was probably causing some of his problem.  He needs to make sure he drinks lots of water.  Especially because he is diabetic and that will make him prone to dehydration if he is not controlling his blood sugar. However the sleepiness might be related to Jordan.  He had previously been on 120 mg daily and we reduced it to 80 mg daily and if he is done okay with his mood we can try reducing it 1 more time to 60 mg.  I will send in a prescription for the 60 mg tablet and he can reduce it if he would like to see if the sleepiness is related to Jordan.  If Kasandra Knudsen is contributing to sleepiness it will get better if he reduces the dose.  If the sleepiness does not get better with the dosage reduction then Latuda is not the cause Reduce Latuda to 60 mg daily  07/30/2021 appt noted: Not much difference in alertness Mania since reducing Latuda. Hypoactive bc thryroid problems and may be the cause of the cramps per the other doctors. Itches badly without hydroxyzine. Spent $500 on gambling game and adeleted.  Past history of drug induced gambling but no major drugs in 15 years. Still exhausted and sleep all day.  But is taying up at night.  Sleeping 16 hours daily.  Wonders if hydroxyzine 25 is making him tired. Itching worse with stress and stress of GM and B crazy. Asks about bipolar shot. Side sleeper and as far as he knows no excessive snoring.  08/23/2021 phone call from patient's father stating that Cody Short had "gone off the rails" and it spent $1000 on the night prior doing online gambling and had gone back to staying up all night and sleeping during the day.  He had also been more verbally abusive.  It was suggested that the Latuda dose be increased from 60 to 80  mg in the evening.  10/09/2021 appointment with the following noted: Sleeping a lot and easily exhausted even after med were changed.   SE a little constipation. History of problems with gambling in the past but not current.  Not thinking of it now. No other manic sx currently.   On Latuda 80 daily now.  Reduced hydroxyzine to 10 and still sleepy.  No other psych meds Plan: Continue Latuda 80 mg bc manic with less .  If recurrence of mania then increase it back up 120 mg PM.  Is getting benefit..  11/18/2021 phone call: Patient had complained of elevated CK levels and wondered if it could be related to Latuda.  Dr. Jennelle Human called Dr.  Duanne Guess and discussed this issue.  It appears that his CK levels have actually been declining over the last several months and are not at a critical level.  It was agreed that this could be monitored over time and no immediate med change was necessary.  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   1 episode when got angy and said awful things to mother but then apologized.  Only 1 bad day. Otherwise mood has been pretty stable lately. Constipation and can't take miralax bc gives him diarrhea.   Sleep normalized to 8 hours since started his job.  So the med was not the source of the sleepiness. Counselor next Tuesday Only hydroxyzine is 10 mg HS Only other psych med is Latuda 80 mg daily Plan: Latuda  Been the best med so far for him.     Sleepiness was not better with reduction in Latuda from 120 to 60 mg daily. Continue Latuda 80 mg bc manic with less .  Option split dose to help sleepiness .   03/12/22 appt noted: Taking hydroxyzine BID Sleep study next  week. 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  At [employer]. Doing well emotionally. Constipation and GI doc tomorrow DT fissure. BS is better A1C 7.4 instead of 9+ Sleep 8 hours.   The only thing he doesn't like is 4 meals daily bc hard to lose weight. Not much mania nor depression. Still anxious and worse without hydroxyzine. Consistent with Latuda at bedtime with food.   Rash bettter .  Itching managed with BID hydroxyzine. Plan: Continue Latuda 80 mg bc manic with less .  Option split dose to help sleepiness .  If recurrence of mania then increase it back up 120 mg PM.  Is getting benefit..  06/23/22 appt noted: Rough 2 weeks.  Larey Seat for 45 yo girl at work and was somewhat manic with her and she quit the job.  But is beginning to feel better now. Dx OSA CPAP and tremendous difference.  Not sleeping as much and more productive at work.  But still some initial sleepiness in the AM Not constipation or diarrhea but slow to move bowels but takes several times in the AM. Started Colace  2 daily for the last couple of days. Lost 10-15# and now stagnant. Working and eating less and weights off and on 3 weeks and trouble losing weight. At work asking too many questions and is too anxious.  Wonders about more hydroxyzine. Sober for 12 years. Can't split Latuda bc gets too sleepy and takes it too much at night. Had same job 6 mos.  Got employee of the month. Plan: no changes  07/08/22 tC CO more manic sx 07/30/22 TC: Pt stated generic latuda is not working for him.He is having manic symptoms such as cleaning his whole house,getting into arguments with his mom and getting agitated very easily.He also gambled $300.He is taking 80 mg  MD response : incr Latuda to 120 mg daily  09/25/22 appt noted: It made all the difference in the world with incr Latuda to 120 mg daily.  No sig mania or depression now. Increased stool softeners to 3 twice daily.  BS  better with Jardiance and wt loss  19#. Tolerating Latuda.   No delusions or visions like before and no trouble some intrusive thoughts. Rash is better with clindamycin solution. Busy with work.  [employer] employee of the month twice at [employer].  37 hours per month. Sleep 8-9 hours. Hydroxyzine 10 HS helps sleep and can't take in daytime. SE can't lose wt on Latuda with medical help. Plan no med changes  02/04/23 TC: Patient is taking hydroxyzine and Latuda. He says he takes as prescribed, taking with enough calories. There was a work situation that caused a lot of stress and he was able to talk to someone to calm him down and provide clarity on the situation. He said that he spent $1000 on a gambling game that didn't even provide any pay out. He also said he got into an argument with 2 different customers and that he doesn't usually do this. He is also describing depression. He said he is sleeping 9 hours a day. Patient does not have pressured or rapid speech, seems to have a calm affect, but is concerned.     MD resp:  I'm out of town.  Only option I can give now is to increase the Latuda to 1 and 1/2 of the 120 mg tabs temporarily until mania resolves     02/04/23 appt noted: in person Increased Latuda to 180 mg daily since ehre bc manic sx. Hydroxyzine 10 HS hangover so taking 10 HS Now also Lautda 120 mg daily. Doing good overall.  No sig mania lately. Few days mild dep but not bad.   Some constipation issues.  Takign stool softeners.   Therapy with Elio [facility] is helping a lot. Wonders if Jordan speeds up sex response.  No compulsive sex behaviors. Lost $7000 on gambling game.  Got banned from game and no other gambling problems now.   B drug addict and causes problems and asking for money and lying.   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   Sleep 8 hours.   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 lithium 1500,   N-acetylcysteine with minimal benefit, , Amantadine with vomiting.  Sertraline and lexapro sexual SE,   Propranolol for tremor,   Ozempic GI px   Review of Systems:  Review of Systems  Constitutional:  Positive for fatigue.  Gastrointestinal:  Positive for constipation and rectal pain.  Endocrine: Positive for polydipsia and polyuria.  Genitourinary:  Negative for decreased urine volume and frequency.  Musculoskeletal:  Positive for myalgias.  Skin:  Positive for rash.       Itching resolved with hydroxyzine  Neurological:  Negative for tremors.  Psychiatric/Behavioral:  Positive for decreased concentration. Negative for agitation, behavioral problems, confusion, dysphoric mood, hallucinations, self-injury, sleep disturbance and suicidal ideas. The patient is hyperactive.     Medications: I have reviewed the patient's current medications.  Current Outpatient Medications  Medication Sig Dispense Refill   Accu-Chek Softclix Lancets lancets Use as instructed to check blood sugar 4 times daily. DX:E11.65 300 each  3   Adapalene-Benzoyl Peroxide 0.1-2.5 % gel SMARTSIG:1 Application Topical Every Evening     Blood Glucose Monitoring Suppl (CONTOUR NEXT MONITOR) w/Device KIT Check sugar 4 times daily 1 kit 0   Blood Glucose Monitoring Suppl (FREESTYLE LITE) DEVI Use as instructed to check sugar 4 times daily 1 each 0   Docusate Sodium (COLACE PO) Take 2 tablets by mouth daily.     empagliflozin (JARDIANCE) 25 MG TABS tablet Take 1 tablet (25 mg total) by mouth daily before  breakfast. 90 tablet 3   famotidine (PEPCID) 40 MG tablet Take 40 mg by mouth 2 (two) times daily.     fenofibrate (TRICOR) 145 MG tablet TAKE 1 TABLET BY MOUTH EVERY DAY 90 tablet 0   glucose blood (ACCU-CHEK GUIDE) test strip Use as instructed to check blood sugar 4 times daily. DX:E11.65 300 each 3   hydrOXYzine (ATARAX) 10 MG tablet TAKE 1 TABLET BY MOUTH THREE TIMES A DAY AS NEEDED (Patient taking differently: Take 10 mg by mouth at bedtime.) 270 tablet 0   insulin aspart (NOVOLOG FLEXPEN) 100 UNIT/ML FlexPen Inject 5-20 Units into the skin in the morning, at noon, in the evening, and at bedtime. 30 mL 3   insulin detemir (LEVEMIR FLEXPEN) 100 UNIT/ML FlexPen Inject 30 Units into the skin daily. 30 mL 2   Insulin Pen Needle (BD PEN NEEDLE NANO 2ND GEN) 32G X 4 MM MISC USE 4 TIMES A DAY WITH     INSULIN PENS 360 each 3   Lurasidone HCl 120 MG TABS TAKE 1 TABLET DAILY WITH   BREAKFAST (DOSE CHANGE) 90 tablet 1   metFORMIN (GLUCOPHAGE-XR) 500 MG 24 hr tablet Take 2 tablets (1,000 mg total) by mouth 2 (two) times daily with a meal. 360 tablet 3   valsartan (DIOVAN) 160 MG tablet Take 160 mg by mouth daily.     ezetimibe (ZETIA) 10 MG tablet TAKE 1 TABLET (10 MG TOTAL) BY MOUTH DAILY AFTER SUPPER. 90 tablet 0   No current facility-administered medications for this visit.    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 Recurs if cannot walk for a week.      TOBACCO ABUSE, HX OF 07/31/2009    Family History  Problem Relation Age of Onset   Diabetes Mother    Breast cancer Mother        was told not hereditary   Cirrhosis Mother        fatty liver   High blood pressure Mother    High blood pressure Father    Diabetes Father    Melanoma Maternal Uncle    Colon cancer Neg Hx  Stomach cancer Neg Hx    Pancreatitis Neg Hx    Heart disease Neg Hx    Kidney disease Neg Hx    Liver disease Neg Hx     Social History   Socioeconomic History   Marital status: Single    Spouse name: Not on file   Number of children: 0   Years of education: Not on file   Highest education level: Bachelor's degree (e.g., BA, AB, BS)  Occupational History   Not on file  Tobacco Use   Smoking status: Former    Packs/day: 2.50    Years: 10.00    Additional pack years: 0.00    Total pack years: 25.00    Types: Cigarettes    Quit date: 10/27/2006    Years since quitting: 16.2   Smokeless tobacco: Never  Vaping Use   Vaping Use: Never used  Substance and Sexual Activity   Alcohol use: Not Currently    Comment: none at all   Drug use: No   Sexual activity: Not on file  Other Topics Concern   Not on file  Social History Narrative   Single. Not dating currently. No kids.    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   Please see review of systems for further details on the patient's review from today.   Objective:   Physical Exam:  There were no vitals taken for this visit.  Physical Exam Constitutional:      General: He is not in acute distress. Musculoskeletal:        General: No deformity.  Neurological:     Mental Status: He is alert and oriented to person, place, and time.     Cranial Nerves: No dysarthria.     Coordination: Coordination normal.  Psychiatric:        Attention and Perception: Attention and perception normal. He does not perceive auditory or visual hallucinations.        Mood and Affect: Mood is anxious. Mood is not depressed or elated. Affect is not labile, blunt, angry or inappropriate.        Speech: Speech normal. Speech is not rapid and pressured or slurred.        Behavior: Behavior normal. Behavior is not agitated, aggressive or hyperactive. Behavior is cooperative.        Thought Content: Thought content normal. Thought content is not paranoid or delusional. Thought content does not include homicidal or suicidal ideation. Thought content does not include homicidal or suicidal plan.        Cognition and Memory: Cognition and memory normal.        Judgment: Judgment normal.     Comments: No paranoia.   Insight and judgment fair to poor. Chronically self-preoccupied and hyperthymic style better. Much Less pressured and less manic presently.  But varies Brief period of pressured speech.         Lab Review:  Component Value Date/Time   NA 139 05/17/2021 1952   K 3.8 05/17/2021 1952   CL 105 05/17/2021 1952   CO2 23 05/17/2021 1952   GLUCOSE 88 05/17/2021 1952   BUN 12 05/17/2021 1952   CREATININE 1.10 05/17/2021 1952   CREATININE 1.24 06/18/2018 1502   CALCIUM 9.2 05/17/2021 1952   PROT 7.5 05/17/2021 1952   ALBUMIN 4.7 05/17/2021 1952   AST 54 (H) 05/17/2021 1952   ALT 45 (H)  05/17/2021 1952   ALKPHOS 65 05/17/2021 1952   BILITOT 0.3 05/17/2021 1952   GFRNONAA >60 05/17/2021 1952   GFRNONAA 84 04/02/2018 1551   GFRAA 97 04/02/2018 1551       Component Value Date/Time   WBC 7.9 05/17/2021 1952   RBC 4.60 05/17/2021 1952   HGB 13.6 05/17/2021 1952   HCT 40.8 05/17/2021 1952   PLT 243 05/17/2021 1952   MCV 88.7 05/17/2021 1952   MCH 29.6 05/17/2021 1952   MCHC 33.3 05/17/2021 1952   RDW 13.2 05/17/2021 1952   LYMPHSABS 1.9 06/27/2016 1441   MONOABS 0.8 06/27/2016 1441   EOSABS 0.2 06/27/2016 1441   BASOSABS 0.1 06/27/2016 1441    Lithium Lvl  Date Value Ref Range Status  09/09/2019 1.1 0.6 - 1.2 mmol/L Final    12/14/19 lithium level 0.6 (trough 13 hours) , normal BMP and calcium   No results found for: "PHENYTOIN", "PHENOBARB", "VALPROATE", "CBMZ"   .res Assessment: Plan:    Cody Short was seen today for follow-up, medication reaction and sleeping problem.  Diagnoses and all orders for this visit:  Bipolar I disorder, most recent episode (or current) manic  Generalized anxiety disorder  Panic disorder with agoraphobia  Insomnia due to mental condition  Marijuana abuse in remission  Tremor due to multiple drugs   Cody Short has had significant disruptive mood and psychotic symptoms and substance abuse over the course of his treatment here.  It has been evident in chronic occupational dysfunction and some relational problems.  He is also been very prone to EPS and elevated blood sugars from atypicals making it difficult to get adequate mood stabilization.   Has a history of severe pancreatitis from Depakote.  He did not have an adequate response to carbamazepine.   lithium was insufficient to control his symptoms in monotherapy and he developed diabetes insipidus.  There are no available mood stabilizers that do not have a warning of elevating blood sugar and cholesterol.  He has tried all the mood stabilizers that do not have this warning.  All  of the available mood stabilizers left that have any effectiveness are antipsychotics and the FDA has required a class warning on all antipsychotics about blood sugar and cholesterol effects.  This is true despite the fact that not all antipsychotics are equal and increasing these risks.  The patient's reading of these potential side effects is complicating finding an effective treatment.  The only way to prove whether or not these meds are elevating his blood sugar and cholesterol is to stop the medications for a period of time.  This exposes him to manic risk but there is no chance of finding an adequate mood stabilizer that will satisfy him until we can prove whether or not these meds are in fact affecting his blood sugar, cholesterol, blood pressure, and pulse.  Latuda  Been the best med so far for him.    Answered questions with no other good options obvious. Sleepiness was not better with reduction in Latuda from  120 to 60 mg daily. Continue Latuda 120 mg PM.  Is getting benefit.Marland Kitchen He still thinks it affects BS but it is controlled.   .   Discussed potential metabolic side effects associated with atypical antipsychotics, as well as potential risk for movement side effects. Advised pt to contact office if movement side effects occur.   continue Hydroxyzine 10 mg HS prn  This has been helpful for anxiety and itching and may have some mild antimanic effect.  Consider modafinil if necessary for function.  Disc risk of mania.  Disc SE.    He has a low stress tolerance.  He can be easily agitated in public. OK to schedule with Elio Forget for therapy  He has continued abstinence from marijuana is encouraged.  He has a history of cannabinoid hyperemesis which finally convinced him to discontinue the marijuana.  His mood disorder and thought disorganization have improved since being off the marijuana.  He has a very long history of heavy marijuana dependence.   Constipation management 1.  Lots of  water 2.  Powdered fiber supplement such as MiraLAX, Citrucel, etc. preferably with a meal 3.  2 stool softeners a day 4.  Milk of magnesia or magnesium tablets if needed Try lower dose miralax. He's going to see GI doc  Sleep hygiene. Disc OSA and how it affects alertness and started CPAP and much better.    NO MED CHANGES INDICATED.  Follow-up 12 weeks bc chronically unstable and typical phone calls between appts. But this has been better lately    Meredith Staggers, MD, DFAPA  Please see After Visit Summary for patient specific instructions.  Future Appointments  Date Time Provider Department Center  02/11/2023  2:00 PM Waldron Session, [facility] None  03/11/2023  4:00 PM Waldron Session, [facility] None  04/27/2023  4:00 PM Carlus Pavlov, MD [facility] None  12/09/2023  3:00 PM Lomax, Amy, NP [facility] None    No orders of the defined types were placed in this encounter.      -------------------------------

## 2023-02-10 ENCOUNTER — Other Ambulatory Visit: Payer: Self-pay

## 2023-02-10 ENCOUNTER — Encounter: Payer: Self-pay | Admitting: Internal Medicine

## 2023-02-10 DIAGNOSIS — E1165 Type 2 diabetes mellitus with hyperglycemia: Secondary | ICD-10-CM

## 2023-02-10 MED ORDER — NOVOLOG FLEXPEN 100 UNIT/ML ~~LOC~~ SOPN: 5.0000 [IU] | PEN_INJECTOR | Freq: Four times a day (QID) | SUBCUTANEOUS | 3 refills | Status: DC

## 2023-02-11 ENCOUNTER — Ambulatory Visit (INDEPENDENT_AMBULATORY_CARE_PROVIDER_SITE_OTHER): Payer: 59 | Admitting: Mental Health

## 2023-02-11 DIAGNOSIS — F3110 Bipolar disorder, current episode manic without psychotic features, unspecified: Secondary | ICD-10-CM

## 2023-02-11 NOTE — Progress Notes (Signed)
[facility] Note  Name: Cody Short Date:  02/11/23 MRN: [id number] DOB: 1976/06/03 PCP: Lewis Moccasin, MD  Time spent:  53 minutes  Treatment:   ind. Therapy    Mental Status Exam:    Appearance:    Casual     Behavior:   Appropriate  Motor:   WNL  Speech/Language:    Clear and Coherent  Affect:   Full range   Mood:   Euthymic, anxious  Thought process:   Logical, linear, goal directed  Thought content:     WNL  Sensory/Perceptual disturbances:     none  Orientation:   x4  Attention:   Good  Concentration:   Good  Memory:   Intact  Fund of knowledge:    Consistent with age and development  Insight:     Good  Judgment:    Good  Impulse Control:   Good     Reported Symptoms:  anxiety, rumination, intermittent depressed mood, impulsivity  Risk Assessment: Danger to Self:  No Self-injurious Behavior: No Danger to Others: No Duty to Warn:no Physical Aggression / Violence:No  Access to Firearms a concern: No  Gang Involvement:No  Patient / guardian was educated about steps to take if suicide or homicide risk level increases between visits: yes While future psychiatric events cannot be accurately predicted, the patient does not currently require acute inpatient psychiatric care and does not currently meet [facility] involuntary commitment criteria.  Subjective:  Patient arrived on time.  Patient stated that he has had a challenging week, that his father was diagnosed with prostate cancer.  They await further testing to learn more about his condition and prognosis.  He went on to share other stressors related to family, namely his brother.  He stated that his brother, who has had substance abuse issues, continues to incessantly ask their parents for money daily.  Patient stated this has gone on for several years and how he recently confronted his brother again and they had an argument.  He went on to share the many challenges related to the  relationship with his brother over the years.  He continues to work his full-time job and would like to get a management position however, stated that he is being patient and just working hard until this opportunity presents itself.  He stated that he has been employed them out several times now. we explored continue to explore strategies to manage obsessive thinking continue to work with patient from a cognitive behavioral framework working with him to reframe thoughts associated with a girl he has been interested in for the past year yet they have no contact.  He acknowledges this pattern which has been present in other situations in the past.     Interventions:  motivational interviewing, CBT    Diagnoses:  No diagnosis found.     Plan: Patient to utilize coping skills as discussed, continue his medication compliance to maintain mood stability. patient will continue to work on identifying and challenging his obsessive thoughts, particularly regarding relationships, and practicing grounding techniques to manage his anxiety. He will also be encouraged to celebrate his achievements at work and continue to take ownership of his successes.  Long-term goals:   Maintain symptom reduction: The patient will report sustained reduction in symptoms of anxiety using both CBT and mindfulness interventions for 3 consecutive months progressively.  Improve emotional regulation: The patient will learn and apply CBT and mindfulness-based strategies to regulate emotions, such as mindfulness-based stress reduction and  cognitive restructuring, and report an improvement in emotional regulation for at least 3 consecutive months progressively.    Short-term goal:  The patient will learn and apply CBT and mindfulness-based coping skills for managing anxiety and practice using it between sessions.       2.   The patient will CBT and mindfulness-based interventions to increase awareness of negative thought  patterns. 3.   The patient will keep and maintain employment to keep financial stress manageable       4.   The patient will decrease anxiety specifically when driving.         Waldron Session, [facility]

## 2023-03-11 ENCOUNTER — Ambulatory Visit (INDEPENDENT_AMBULATORY_CARE_PROVIDER_SITE_OTHER): Payer: 59 | Admitting: Mental Health

## 2023-03-11 DIAGNOSIS — F3110 Bipolar disorder, current episode manic without psychotic features, unspecified: Secondary | ICD-10-CM | POA: Diagnosis not present

## 2023-03-11 NOTE — Progress Notes (Signed)
[facility] Note  Name: Cody Short Date:  03/11/23 MRN: [id number] DOB: 10-20-1976 PCP: Lewis Moccasin, MD  Time spent:  53 minutes  Treatment:   ind. Therapy    Mental Status Exam:    Appearance:    Casual     Behavior:   Appropriate  Motor:   WNL  Speech/Language:    Clear and Coherent  Affect:   Full range   Mood:   Euthymic, anxious  Thought process:   Logical, linear, goal directed  Thought content:     WNL  Sensory/Perceptual disturbances:     none  Orientation:   x4  Attention:   Good  Concentration:   Good  Memory:   Intact  Fund of knowledge:    Consistent with age and development  Insight:     Good  Judgment:    Good  Impulse Control:   Good     Reported Symptoms:  anxiety, rumination, intermittent depressed mood, impulsivity  Risk Assessment: Danger to Self:  No Self-injurious Behavior: No Danger to Others: No Duty to Warn:no Physical Aggression / Violence:No  Access to Firearms a concern: No  Gang Involvement:No  Patient / guardian was educated about steps to take if suicide or homicide risk level increases between visits: yes While future psychiatric events cannot be accurately predicted, the patient does not currently require acute inpatient psychiatric care and does not currently meet [facility] involuntary commitment criteria.  Subjective:  Patient arrived on time. Assessed progress where he stated that he learned recently that his grandmother is failing in health and will most likely pass soon.  He stated that she is 45 years old going on to share some family history.  He stated that her passing soon, along with his brother's ongoing substance use and stress on the family, he has felt more stress and anxiety recently.  He continues to take his medications consistently, although about 2 weeks ago he stopped his anxiety medication for about 2 or 3 days and determined that it was causing weight gain.  He stated that while  off the medication he felt more anxious and probably got back on his medication which he has found helpful.  He continues to ruminate about past relationships with girls, 1 of which he dated briefly when he was in his 45s.  He stated that he continues to ruminate at times about the girl that he works with a few months ago.  He stated he continues to follow through with trying to focus on realistic and logical thoughts versus the emotional thoughts to work on managing his anxiety more effectively.  Continue to explore with patient ways to frame this past relationship, to avoid blaming himself for anything he said or did that made the relationship discontinue.  Patient was reminded that he has stated in previous sessions that this girl left the job due to getting an internship and was getting engaged and how this can indicate that he is not the cause.  Patient attributes his anxiety to his tendency to obsess and have anxious thoughts and will continue to work on reframing them between sessions.  Interventions:  motivational interviewing, CBT    Diagnoses:    ICD-10-CM   1. Bipolar I disorder, most recent episode (or current) manic (HCC)  F31.10          Plan: Patient to utilize coping skills as discussed, continue his medication compliance to maintain mood stability. patient will continue to work on identifying and  challenging his obsessive thoughts, particularly regarding relationships, and practicing grounding techniques to manage his anxiety. He will also be encouraged to celebrate his achievements at work and continue to take ownership of his successes.  Long-term goals:   Maintain symptom reduction: The patient will report sustained reduction in symptoms of anxiety using both CBT and mindfulness interventions for 3 consecutive months progressively.  Improve emotional regulation: The patient will learn and apply CBT and mindfulness-based strategies to regulate emotions, such as  mindfulness-based stress reduction and cognitive restructuring, and report an improvement in emotional regulation for at least 3 consecutive months progressively.    Short-term goal:  The patient will learn and apply CBT and mindfulness-based coping skills for managing anxiety and practice using it between sessions.       2.   The patient will CBT and mindfulness-based interventions to increase awareness of negative thought patterns. 3.   The patient will keep and maintain employment to keep financial stress manageable       4.   The patient will decrease anxiety specifically when driving.         Waldron Session, [facility]

## 2023-04-04 ENCOUNTER — Other Ambulatory Visit: Payer: Self-pay | Admitting: Internal Medicine

## 2023-04-04 DIAGNOSIS — E1165 Type 2 diabetes mellitus with hyperglycemia: Secondary | ICD-10-CM

## 2023-04-07 DIAGNOSIS — L700 Acne vulgaris: Secondary | ICD-10-CM | POA: Diagnosis not present

## 2023-04-16 ENCOUNTER — Ambulatory Visit (INDEPENDENT_AMBULATORY_CARE_PROVIDER_SITE_OTHER): Payer: 59 | Admitting: Mental Health

## 2023-04-16 DIAGNOSIS — F3110 Bipolar disorder, current episode manic without psychotic features, unspecified: Secondary | ICD-10-CM | POA: Diagnosis not present

## 2023-04-16 NOTE — Progress Notes (Addendum)
[facility] Note  Name: Cody Short Date:  04/16/23 MRN: [id number] DOB: 24-Jul-1976 PCP: Lewis Moccasin, MD  Time in: 3: 00 PM time out: 3: 50 1 PM  Treatment:   ind. Therapy    Mental Status Exam:    Appearance:    Casual     Behavior:   Appropriate  Motor:   WNL  Speech/Language:    Clear and Coherent  Affect:   Full range   Mood:   Euthymic, anxious  Thought process:   Logical, linear, goal directed  Thought content:     WNL  Sensory/Perceptual disturbances:     none  Orientation:   x4  Attention:   Good  Concentration:   Good  Memory:   Intact  Fund of knowledge:    Consistent with age and development  Insight:     Good  Judgment:    Good  Impulse Control:   Good     Reported Symptoms:  anxiety, rumination, intermittent depressed mood, impulsivity  Risk Assessment: Danger to Self:  No Self-injurious Behavior: No Danger to Others: No Duty to Warn:no Physical Aggression / Violence:No  Access to Firearms a concern: No  Gang Involvement:No  Patient / guardian was educated about steps to take if suicide or homicide risk level increases between visits: yes While future psychiatric events cannot be accurately predicted, the patient does not currently require acute inpatient psychiatric care and does not currently meet [facility] involuntary commitment criteria.  Subjective:  Patient arrived on time.  Assessed progress where he shared how he continues to deal with stress at home.  His stated that his brother continues to have chronic medical issues along with his substance abuse issues.  He stated that he would like his parents to get a restraining order due to his brother often coming to their house, banging on the door, calling them at all hours of the night to try to solicit money from his parents.  Other stressors related to work were shared.  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  Continue to work with patient from a cognitive behavioral framework providing some psychoeducation related to thoughts and their impact on his mood, behaviors.  Explored various situations at work, how to work on Geneticist, molecular toward responding in a professional manner.  Interventions:  motivational interviewing, CBT    Diagnoses:    ICD-10-CM   1. Bipolar I disorder, most recent episode (or current) manic (HCC)  F31.10           Plan: Patient to utilize coping skills as discussed, continue his medication compliance to maintain mood stability. patient will continue to work on identifying and challenging his obsessive thoughts, particularly regarding relationships, and practicing grounding techniques to manage his anxiety. He will also be encouraged to celebrate his achievements at work and continue to take ownership of his successes.  Long-term goals:   Maintain symptom reduction: The patient will report sustained reduction in symptoms of anxiety using both CBT and mindfulness interventions for 3 consecutive months progressively.  Improve emotional regulation: The patient will learn and apply CBT and mindfulness-based strategies to regulate emotions, such as mindfulness-based stress reduction and cognitive restructuring, and report an improvement in emotional regulation for at least 3 consecutive months progressively.    Short-term goal:  The patient will learn and apply CBT and mindfulness-based coping skills  for managing anxiety and practice using it between sessions.       2.   The patient will CBT and mindfulness-based interventions to increase awareness of negative thought patterns. 3.   The patient will keep and maintain employment to keep financial stress manageable       4.   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         Waldron Session,  [facility]

## 2023-04-23 DIAGNOSIS — G4733 Obstructive sleep apnea (adult) (pediatric): Secondary | ICD-10-CM | POA: Diagnosis not present

## 2023-04-27 ENCOUNTER — Encounter: Payer: Self-pay | Admitting: Internal Medicine

## 2023-04-27 ENCOUNTER — Ambulatory Visit: Payer: 59 | Admitting: Internal Medicine

## 2023-04-27 VITALS — BP 124/80 | HR 88 | Ht 70.0 in | Wt 222.0 lb

## 2023-04-27 DIAGNOSIS — E669 Obesity, unspecified: Secondary | ICD-10-CM | POA: Diagnosis not present

## 2023-04-27 DIAGNOSIS — E66811 Obesity, class 1: Secondary | ICD-10-CM

## 2023-04-27 DIAGNOSIS — E119 Type 2 diabetes mellitus without complications: Secondary | ICD-10-CM

## 2023-04-27 DIAGNOSIS — E1165 Type 2 diabetes mellitus with hyperglycemia: Secondary | ICD-10-CM

## 2023-04-27 DIAGNOSIS — E782 Mixed hyperlipidemia: Secondary | ICD-10-CM | POA: Diagnosis not present

## 2023-04-27 DIAGNOSIS — Z794 Long term (current) use of insulin: Secondary | ICD-10-CM | POA: Diagnosis not present

## 2023-04-27 DIAGNOSIS — E061 Subacute thyroiditis: Secondary | ICD-10-CM

## 2023-04-27 DIAGNOSIS — Z7984 Long term (current) use of oral hypoglycemic drugs: Secondary | ICD-10-CM | POA: Diagnosis not present

## 2023-04-27 LAB — HEMOGLOBIN A1C: Hemoglobin A1C: 8.2

## 2023-04-27 NOTE — Progress Notes (Signed)
Patient ID: Cody Short, male   DOB: 1975/11/22, 47 y.o.   MRN: [id number]  HPI: Cody Short is a 47 y.o.-year-old male, presenting for follow-up for DM2, dx in ~2010, insulin-dependent, uncontrolled, without long term complications. Last visit 4 months ago.  Interim history: No increased urination, blurry vision, nausea, chest pain.  Before last visit, he gained weight as he had to increase the Latuda dose. He had a lot of stress, relaxed his diet.  Sugars are higher. He stopped exercise in the last few months >> restarted in last 5 days. He sees dermatology (Dr. Melida Quitter) and will need to start Accutane for acne rash caused by Latuda.  DM2: Patient's diabetes is directly correlated with his bipolar disease treatment: In the past he had frequent urination resolved after stopping lithium.  While transitioning off lithium, his Saphris dose was increased the blood sugars started to increase afterwards.    He was then taken off Saphris and started on Loxitane.  This was making him sleepier, but was not increasing his blood sugars.  In fact, sugars started to improve and he was able to reduce his insulin doses.   She had to stop Loxitane 2/2 increased BP and pulse.  In summer 2021, he switched to Caplyta >> sugars improved significantly on this.  In fact, he tells me that he was able to come off insulin completely while on this.  However, this did not work for him -was having anger outbursts on it.   He started on Latuda >> feeling better on this. However, on this, he was eating more >> sugars increased.  Also, he gained weight. He continues on this.  He was on an appetite suppressant (Topamax). This was causing nausea >> stopped.   Reviewed HbA1c levels: Lab Results  Component Value Date   HGBA1C 7.3 (A) 11/24/2022   HGBA1C 7.4 (A) 07/15/2022   HGBA1C 7.4 (A) 03/06/2022  05/07/2021: HbA1c 9.3% 12/22/2018: HbA1c 7.2%  Pt is on a regimen of: - Metformin ER 2000  mg at dinnertime - Jardiance 10 >> 25 mg daily in am - Novolog before meals:28-35 units 2-3x >> 35-45 >> 5-20 >> 5-15 >> (15-)20 units 3x a day before meals - Lantus 15 >> 0 >> 25 >> 40 >> 60-75 >> 60 >> Semglee/ Lantus 45 >> Levemir 15 >> 30-35 units at bedtime He could not tolerate Ozempic >> nausea, AP We had to stop Jardiance due to increased urination. He was previously on Lantus but stopped since sugars improved. He tried Glipizide >> hypoglycemia in the 40s repeatedly. Lowest: 25. Also, Glipizide 2.5 mg in am >> nausea, vomiting, constipation We tried Invokana >> bothersome urination (when he was working) >> had to stop. He had pancreatitis 2/2 Depakote and HTG in the past. He stopped Actos b/c stomach pain. >>  We stopped Cycloset >> inefficient, could not use b/c price.  Pt checks his sugars 4 times a day per review of his log: - am: 104-178, 181 >> 105-156, 200, 242 >> 108-144, 157, 175 >> 122-218 - 2h after b'fast: 100-166, 244 >> 101 >> 162, 173, 325 >> n/c  - lunch:  172-259 >> 175-260, 343 >> 67, 85-120 >> 85-143, 154 >> n/c - 2h after lunch: 140-262, 310 >> 110-202, 290 >> 143, 144 >> n/c - before dinner: 64-176, 196 >> 81-144, 218, 224 >> 94-153, 244 >> 96 - 2h after dinner: 76, 80, 160-201, 267 >> 89-174, 232 >> n/c - bedtime: 80-160, 190 >> 107-180 >>  n/c >> 91-232 >> 115-252, 263, 281 - nighttime: 89-181 >> see above >> 134 >> 83, 95 >> n/c Lowest sugar was 69... >> 80 >> 96; he has hypoglycemia awareness in the 70s. Highest sugar was 354 ...>> 239 >> 340.  Glucometer: FreeStyle Lite (he likes this)  No history of CKD, last BUN/creatinine:  09/10/2022: 20/1.3, GFR 69.5, glucose 127 Lab Results  Component Value Date   BUN 12 05/17/2021   CREATININE 1.10 05/17/2021   He had MAU in the past, improved at last check: Lab Results  Component Value Date   MICRALBCREAT 35.2 (H) 05/10/2020   MICRALBCREAT 26.7 04/02/2018   MICRALBCREAT 3.7 02/10/2017   MICRALBCREAT  27.3 12/17/2015   MICRALBCREAT 4.0 02/22/2015  12/22/2018: 262 On Diovan.  He has mixed hyperlipidemia: 09/10/2022: 173/199/40/93 06/23/2022: 188/347/41/78 Lab Results  Component Value Date   CHOL 225 (H) 02/10/2022   HDL 37 (L) 02/10/2022   LDLCALC 145 (H) 02/10/2022   LDLDIRECT 120.0 04/02/2018   TRIG 237 (H) 02/10/2022   CHOLHDL 4.6 08/06/2020  05/03/2020: 225/345/38/126 11/23/2019: 109/148/34/49 12/22/2018: 220/359/41/145 On fenofibrate 145 >> 160.   On Zetia.  He tried Repatha.  Rosuvastatin caused leg cramps, reportedly elevated CK (900s).  Most recent CK was 562.  - last eye exam was in 2023: No DR. Decreased vision - depth.  -+ Numbness but no tingling in his feet. Last foot exam 03/06/2022.  He developed chest tightness 05/2021 and saw Dr. Jacinto Halim.  He had CAC score of 0 (07/11/2021). He has a history of scrotal rash-improved on Nystatin + Triamcinolone.  He may need refills in the future. He has recurrent groin furunculosis. 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  Subclinical thyrotoxicosis: TSH levels were reviewed:  06/23/2022: TSH 1.13 Lab Results  Component Value Date   TSH 1.11 10/10/2021   TSH <0.01 (L) 01/18/2021   TSH <0.01 (L) 12/11/2020   TSH 0.02 (L) 09/14/2020   TSH 4.03 12/17/2015   TSH 2.08 01/03/2014   TSH 5.15 11/20/2011   TSH 2.15 07/04/2010   TSH 3.82 12/12/2009   TSH 1.81 10/29/2007   FREET4 0.76 10/10/2021   FREET4 1.13 01/18/2021   FREET4 1.56 12/11/2020   FREET4 1.41 09/14/2020   T3FREE 3.7 10/10/2021   T3FREE 3.5 01/18/2021   T3FREE 4.1 12/11/2020   T3FREE 3.9 09/14/2020  05/07/2021: TSH 1.21 08/06/2020: TSH 0.079 05/03/2020: TSH 0.528 12/22/2018: 3.648  We checked a thyroid uptake and scan (01/02/2021) and this was consistent with thyroiditis: The thyroid scan is unremarkable. No hot or cold thyroid nodules are identified. 4 hour I-123 uptake = 2.7% (normal 5-20%) 24 hour I-123 uptake = 5.4% (normal 10-30%)    IMPRESSION: Low 4 hour and 24 hour or I 123 uptake may suggest thyroiditis.   Pt denies: - feeling nodules in neck - hoarseness - dysphagia - choking  He had problems with muscle cramps/abdominal pain and lipase was found to be elevated again, at 67 (11-51) in 04/2021.  At that time, he also had transaminitis and an increase CK at 562 (49-347).    ROS:+ see HPI  I reviewed pt's medications, allergies, PMH, social hx, family hx, and changes were documented in the history of present illness. Otherwise, unchanged from my initial visit note.  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 Recurs if cannot walk for a week.      TOBACCO ABUSE, HX OF 07/31/2009   Past Surgical History:  Procedure Laterality Date   pilar cystectomy     History   Social History   Marital Status: Single    Spouse Name: N/A   Number of Children: 0   Occupational History      Social History Main Topics   Smoking status: Former Smoker - quit in 2010   Smokeless tobacco: Never Used   Alcohol Use: No     Comment: none at all   Drug Use: No   Current Outpatient Medications  Medication Sig Dispense Refill   Accu-Chek Softclix Lancets lancets Use as instructed to check blood sugar 4 times daily. DX:E11.65 300 each 3   Adapalene-Benzoyl Peroxide 0.1-2.5 % gel SMARTSIG:1 Application Topical Every Evening     Blood Glucose Monitoring Suppl (CONTOUR NEXT MONITOR) w/Device KIT Check sugar 4 times daily 1 kit 0   Blood Glucose Monitoring Suppl (FREESTYLE LITE) DEVI Use as instructed to check sugar 4 times daily 1 each 0   Docusate Sodium (COLACE PO) Take 2 tablets by mouth daily.     empagliflozin  (JARDIANCE) 25 MG TABS tablet TAKE 1 TABLET DAILY BEFORE BREAKFAST 90 tablet 0   ezetimibe (ZETIA) 10 MG tablet TAKE 1 TABLET (10 MG TOTAL) BY MOUTH DAILY AFTER SUPPER. 90 tablet 0   famotidine (PEPCID) 40 MG tablet Take 40 mg by mouth 2 (two) times daily.     fenofibrate (TRICOR) 145 MG tablet TAKE 1 TABLET BY MOUTH EVERY DAY 90 tablet 0   glucose blood (ACCU-CHEK GUIDE) test strip Use as instructed to check blood sugar 4 times daily. DX:E11.65 300 each 3   hydrOXYzine (ATARAX) 10 MG tablet TAKE 1 TABLET BY MOUTH THREE TIMES A DAY AS NEEDED (Patient taking differently: Take 10 mg by mouth at bedtime.) 270 tablet 0   insulin aspart (NOVOLOG FLEXPEN) 100 UNIT/ML FlexPen Inject 5-20 Units into the skin in the morning, at noon, in the evening, and at bedtime. 30 mL 3   insulin detemir (LEVEMIR FLEXPEN) 100 UNIT/ML FlexPen Inject 30 Units into the skin daily. 30 mL 2   Insulin Pen Needle (BD PEN NEEDLE NANO 2ND GEN) 32G X 4 MM MISC USE 4 TIMES A DAY WITH     INSULIN PENS 360 each 3   Lurasidone HCl 120 MG TABS TAKE 1 TABLET DAILY WITH   BREAKFAST (DOSE CHANGE) 90 tablet 1   metFORMIN (GLUCOPHAGE-XR) 500 MG 24 hr tablet TAKE 2 TABLETS (1,000MG     TOTAL) 2 TIMES DAILY WITH  MEALS 360 tablet 0   valsartan (DIOVAN) 160 MG tablet Take 160 mg by mouth daily.     No current facility-administered medications for this visit.    No current facility-administered medications on file prior to visit.    Allergies  Allergen Reactions   Divalproex Sodium Other (See Comments)    unknown   Methylphenidate Hcl     Aggravate bipolar   Oxycodone Hcl     REACTION: hallucinations   Ozempic (0.25 Or 0.5 Mg-Dose) [Semaglutide(0.25 Or 0.5mg -Dos)] Nausea Only   Paroxetine     Aggravate bipolar disorder   Rosuvastatin Other (See Comments)    Myopathy -muscle cramps, elevated CK   Family History  Problem Relation Age of Onset  Diabetes Mother    Breast cancer Mother        was told not hereditary   Cirrhosis  Mother        fatty liver   High blood pressure Mother    High blood pressure Father    Diabetes Father    Melanoma Maternal Uncle    Colon cancer Neg Hx    Stomach cancer Neg Hx    Pancreatitis Neg Hx    Heart disease Neg Hx    Kidney disease Neg Hx    Liver disease Neg Hx    PE: BP 124/80   Pulse 88   Ht 5\' 10"  (1.778 m)   Wt 222 lb (100.7 kg)   SpO2 97%   BMI 31.85 kg/m   Wt Readings from Last 3 Encounters:  04/27/23 222 lb (100.7 kg)  12/08/22 218 lb (98.9 kg)  11/24/22 225 lb (102.1 kg)   Constitutional: overweight, in NAD Eyes: EOMI, no exophthalmos ENT:no thyromegaly, no cervical lymphadenopathy Cardiovascular: RRR, No MRG Respiratory: CTA B Musculoskeletal: no deformities Skin:no rashes Neurological: no tremor with outstretched hands Diabetic Foot Exam - Simple   Simple Foot Form Diabetic Foot exam was performed with the following findings: Yes 04/27/2023  3:40 PM  Visual Inspection See comments: Yes Sensation Testing Intact to touch and monofilament testing bilaterally: Yes Pulse Check Posterior Tibialis and Dorsalis pulse intact bilaterally: Yes Comments Vertical 1cm scar on R heel - no erythema, bleeding    ASSESSMENT: 1. DM2, insulin-dependent, uncontrolled, without long term complications, but with hyperglycemia  His test were negative for type 1 diabetes: Component     Latest Ref Rng 05/28/2015  C-Peptide     0.80 - 3.90 ng/mL 2.88  Glucose, Fasting     65 - 99 mg/dL 161 (H)  Glutamic Acid Decarb Ab     <5 IU/mL <5  Pancreatic Islet Cell Antibody     < 5 JDF Units <5   - His diabetes is difficult to manage because of multiple intolerances: - We tried to add Invokana but he did not tolerated due to increased urination.  - We cannot use a DPP 4 inhibitor or a GLP-1 receptor agonist due to his history of pancreatitis.  - He had to stop Actos >>  abd. Pain - we stopped Cycloset >> expensive and not effective - we tried Glipizide >> GI sxs:  N/V/D - he tried the Dexcom G6 CGM but he felt that this was giving him a lot of errors and stopped  2. HTG  3.  Obesity class I  4.  Subacute thyroiditis  PLAN:  1. Patient with uncontrolled type 2 diabetes, on oral antidiabetic regimen with metformin and SGLT2 inhibitor and also basal/bolus insulin regimen, with improving control after starting Jardiance.  At last visit, sugars were mostly within target range with only occasional hyperglycemic spikes, especially at bedtime.  We discussed that for most rich meals, he may need to take a slightly higher dose of NovoLog.  He was mentioning that he sometimes underdosed his NovoLog as he was not sure how much he would eat.  We did not change his regimen since last visit.  At that time, he already reduced his insulin doses drastically and was doing a good job with this.  Sugars in the morning are at goal.  HbA1c was better, at 7.3%. -At today's visit, sugars are variable, but with many values above target in the morning and also at night, after dinner.  Upon questioning,  he relaxed his diet since last visit due to increased stress: He was snacking between meals and eating double portions.  In the last 5 days, he is trying to work on reducing his portions and try to replace snacking with fruit.  We also discussed about not having the snacks/candy in the house, if possible.  Also, it is very important to continue to exercise.  Otherwise, for now, we discussed about continue the current regimen but stay with the higher doses of basal and mealtime insulin.  We again discussed about GLP-1 receptor agonist, but he is not a good candidate for this due to history of pancreatitis.  He did retry Ozempic with abdominal pain and nausea, unfortunately. - I advised him to: Patient Instructions  Please continue: - Metformin ER 2000 mg at dinnertime - Jardiance 25 mg daily in am - Novolog before meals: 15-20 units 3x a day before meals - Semglee/Levemir 35 units at  bedtime  Try to reduce snacking and portions.  Please return in 3-4 months with your sugar log.   - we checked his HbA1c: 8.2% (higher) - advised to check sugars at different times of the day - 4x a day, rotating check times - advised for yearly eye exams >> he is UTD - return to clinic in 3-4 months  2. HL -With predominant hypertriglyceridemia -Reviewed latest lipid panel from 06/15/2022 and 09/15/2022: Triglycerides only slightly elevated, at 199 on the last lipid panel and LDL <100, at 93 -On fenofibrate 160 mg daily and Zetia 10 mg daily -He had muscle cramps and elevated CK previously but this normalized.  His muscle cramps improved.  3.  Obesity class I -He gained a significant amount of weight on Latuda -We cannot use Ozempic due to history of pancreatitis and GI symptoms when he actually tried it -[employer], which should also help with weight loss -He relaxed his diet greatly since last visit and just now he is starting to reduce portions and limit snacks and also to exercise. -He continues to use a CPAP for OSA -Unfortunately, he needs to get a snack with 350 cal at bedtime when he takes Jordan.  4.  Subacute thyroiditis -Diagnosed by uptake and scan -Resolved.  TSH was normal at last check in 05/2022, at 1.13 -No signs or symptoms of thyrotoxicosis  Carlus Pavlov, MD PhD [facility], [facility], [facility]

## 2023-04-27 NOTE — Patient Instructions (Addendum)
Please continue: - Metformin ER 2000 mg at dinnertime - Jardiance 25 mg daily in am - Novolog before meals: 15-20 units 3x a day before meals - Semglee/Levemir 35 units at bedtime  Try to reduce snacking and portions.  Please return in 3-4 months with your sugar log.

## 2023-05-07 ENCOUNTER — Telehealth: Payer: Self-pay | Admitting: Psychiatry

## 2023-05-07 DIAGNOSIS — L700 Acne vulgaris: Secondary | ICD-10-CM | POA: Diagnosis not present

## 2023-05-07 NOTE — Telephone Encounter (Signed)
Cody Short called at 3:34 to report that he is going to start taking Accutane.  He wants to make it is ok for him to take this medication.  Please call to discuss.

## 2023-05-07 NOTE — Telephone Encounter (Signed)
Patient wants to start Accutane for cystic acne and wants to know if okay to take with his MH meds. He is only taking Latuda and hydroxyzine.

## 2023-05-08 NOTE — Telephone Encounter (Signed)
Patient notified

## 2023-05-08 NOTE — Telephone Encounter (Signed)
Yes it is ok to take Accutane with meds.  Very very rarely Accutan can cause some depression but I think risk is low for him

## 2023-05-13 DIAGNOSIS — Z114 Encounter for screening for human immunodeficiency virus [HIV]: Secondary | ICD-10-CM | POA: Diagnosis not present

## 2023-05-13 DIAGNOSIS — Z13220 Encounter for screening for lipoid disorders: Secondary | ICD-10-CM | POA: Diagnosis not present

## 2023-05-13 DIAGNOSIS — Z125 Encounter for screening for malignant neoplasm of prostate: Secondary | ICD-10-CM | POA: Diagnosis not present

## 2023-05-13 DIAGNOSIS — Z0000 Encounter for general adult medical examination without abnormal findings: Secondary | ICD-10-CM | POA: Diagnosis not present

## 2023-05-14 ENCOUNTER — Ambulatory Visit (INDEPENDENT_AMBULATORY_CARE_PROVIDER_SITE_OTHER): Payer: 59 | Admitting: Mental Health

## 2023-05-14 DIAGNOSIS — F3110 Bipolar disorder, current episode manic without psychotic features, unspecified: Secondary | ICD-10-CM | POA: Diagnosis not present

## 2023-05-14 NOTE — Progress Notes (Signed)
[facility] Note  Name: Cody Short Date:  05/14/23 MRN: [id number] DOB: Jun 09, 1976 PCP: Lewis Moccasin, MD  Time in: 3: 00 PM time out: 3: 49 PM  Treatment:   ind. Therapy    Mental Status Exam:    Appearance:    Casual     Behavior:   Appropriate  Motor:   WNL  Speech/Language:    Clear and Coherent  Affect:   Full range   Mood:   Euthymic, anxious  Thought process:   Logical, linear, goal directed  Thought content:     WNL  Sensory/Perceptual disturbances:     none  Orientation:   x4  Attention:   Good  Concentration:   Good  Memory:   Intact  Fund of knowledge:    Consistent with age and development  Insight:     Good  Judgment:    Good  Impulse Control:   Good     Reported Symptoms:  anxiety, rumination, intermittent depressed mood, impulsivity  Risk Assessment: Danger to Self:  No Self-injurious Behavior: No Danger to Others: No Duty to Warn:no Physical Aggression / Violence:No  Access to Firearms a concern: No  Gang Involvement:No  Patient / guardian was educated about steps to take if suicide or homicide risk level increases between visits: yes While future psychiatric events cannot be accurately predicted, the patient does not currently require acute inpatient psychiatric care and does not currently meet [employer] involuntary commitment criteria.  Subjective:  Patient arrived on time.  Assessed since last visit.  Patient shared challenges with body image.  He stated this has been an issue for most of his life, sharing specific incidences that were hurtful in the past.  He identified wanting to work on the self depreciating narrative.  Facilitated his identifying ways to identify these thoughts that increase his negative self concept and work to reframe them.  Encouraged patient to consider going 2 weeks being more mindful of when this occurs and attending to the thoughts.  He continued to share stress related to family, his  brother causing significant stress for patient and his parents.  Ways he tries to set boundaries with his brother as needed was explored.  Interventions:  motivational interviewing, CBT    Diagnoses:    ICD-10-CM   1. Bipolar I disorder, most recent episode (or current) manic (HCC)  F31.10            Plan: Patient to utilize coping skills as discussed, continue his medication compliance to maintain mood stability. patient will continue to work on identifying and challenging his obsessive thoughts, particularly regarding relationships, and practicing grounding techniques to manage his anxiety. He will also be encouraged to celebrate his achievements at work and continue to take ownership of his successes.  Long-term goals:   Maintain symptom reduction: The patient will report sustained reduction in symptoms of anxiety using both CBT and mindfulness interventions for 3 consecutive months progressively.  Improve emotional regulation: The patient will learn and apply CBT and mindfulness-based strategies to regulate emotions, such as mindfulness-based stress reduction and cognitive restructuring, and report an improvement in emotional regulation for at least 3 consecutive months progressively.    Short-term goal:  The patient will learn and apply CBT and mindfulness-based coping skills for managing anxiety and practice using it between sessions.       2.   The patient will CBT and mindfulness-based interventions to increase awareness of negative thought patterns. 3.   The patient  will keep and maintain employment to keep financial stress manageable       4.   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         Waldron Session, [facility]

## 2023-05-18 DIAGNOSIS — Z0000 Encounter for general adult medical examination without abnormal findings: Secondary | ICD-10-CM | POA: Diagnosis not present

## 2023-05-25 ENCOUNTER — Other Ambulatory Visit: Payer: Self-pay | Admitting: Internal Medicine

## 2023-05-25 DIAGNOSIS — E1165 Type 2 diabetes mellitus with hyperglycemia: Secondary | ICD-10-CM

## 2023-05-27 ENCOUNTER — Other Ambulatory Visit: Payer: Self-pay | Admitting: Internal Medicine

## 2023-05-27 DIAGNOSIS — E1165 Type 2 diabetes mellitus with hyperglycemia: Secondary | ICD-10-CM

## 2023-06-01 ENCOUNTER — Encounter: Payer: Self-pay | Admitting: Nurse Practitioner

## 2023-06-09 DIAGNOSIS — K130 Diseases of lips: Secondary | ICD-10-CM | POA: Diagnosis not present

## 2023-06-09 DIAGNOSIS — L700 Acne vulgaris: Secondary | ICD-10-CM | POA: Diagnosis not present

## 2023-06-09 DIAGNOSIS — L853 Xerosis cutis: Secondary | ICD-10-CM | POA: Diagnosis not present

## 2023-06-10 NOTE — Telephone Encounter (Signed)
No action done

## 2023-06-17 ENCOUNTER — Ambulatory Visit (INDEPENDENT_AMBULATORY_CARE_PROVIDER_SITE_OTHER): Payer: 59 | Admitting: Mental Health

## 2023-06-17 DIAGNOSIS — F3110 Bipolar disorder, current episode manic without psychotic features, unspecified: Secondary | ICD-10-CM | POA: Diagnosis not present

## 2023-06-17 NOTE — Progress Notes (Signed)
[facility] Note  Name: Cody Short Date:  06/17/23 MRN: [id number] DOB: 09-24-1976 PCP: Lewis Moccasin, MD  Time in: 2: 00 PM time out: 2: 40 PM  Treatment:   ind. Therapy    Mental Status Exam:    Appearance:    Casual     Behavior:   Appropriate  Motor:   WNL  Speech/Language:    Clear and Coherent  Affect:   Full range   Mood:   Euthymic, anxious  Thought process:   Logical, linear, goal directed  Thought content:     WNL  Sensory/Perceptual disturbances:     none  Orientation:   x4  Attention:   Good  Concentration:   Good  Memory:   Intact  Fund of knowledge:    Consistent with age and development  Insight:     Good  Judgment:    Good  Impulse Control:   Good     Reported Symptoms:  anxiety, rumination, intermittent depressed mood, impulsivity  Risk Assessment: Danger to Self:  No Self-injurious Behavior: No Danger to Others: No Duty to Warn:no Physical Aggression / Violence:No  Access to Firearms a concern: No  Gang Involvement:No  Patient / guardian was educated about steps to take if suicide or homicide risk level increases between visits: yes While future psychiatric events cannot be accurately predicted, the patient does not currently require acute inpatient psychiatric care and does not currently meet [facility] involuntary commitment criteria.  Subjective:  Patient arrived on time.  Assessed progress since last visit.  Patient shared how he continues to work his full-time job, would like the opportunity for advancement however, this continues to be a challenge.  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  He has considered looking at other job options but is unsure of what other jobs he might be interested in at this time.  He shared the stressor with family, namely his brother who he stated has an ongoing  addiction issue, often asks his parents for money and will arrive at their house at all hours of the night making requests for money.  He continues to cope with negative thoughts about himself, is self-critical about his looks, shares how he has continued to try and work on reframing thoughts as discussed in previous sessions.  He stated this has been helpful but recently more challenging.  We encouraged him to identify other personal strengths and positive characteristics about himself as opposed to just physical attributes.   Interventions:  motivational interviewing, CBT    Diagnoses:    ICD-10-CM   1. Bipolar I disorder, most recent episode (or current) manic (HCC)  F31.10             Plan: Patient to utilize coping skills as discussed, continue his medication compliance to maintain mood stability. patient will continue to work on identifying and challenging his obsessive thoughts, particularly regarding relationships, and practicing grounding techniques to manage his anxiety. He will also be encouraged to celebrate his achievements at work and continue to take ownership of his successes.  Long-term goals:   Maintain symptom reduction: The patient will report sustained reduction in symptoms of anxiety using both CBT and mindfulness interventions for 3 consecutive months progressively.  Improve emotional regulation: The patient will learn and apply CBT and mindfulness-based strategies to regulate emotions, such as mindfulness-based stress reduction  and cognitive restructuring, and report an improvement in emotional regulation for at least 3 consecutive months progressively.    Short-term goal:  The patient will learn and apply CBT and mindfulness-based coping skills for managing anxiety and practice using it between sessions.       2.   The patient will CBT and mindfulness-based interventions to increase awareness of negative thought patterns. 3.   The patient will keep and maintain  employment to keep financial stress manageable       4.   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         Waldron Session, [facility]

## 2023-06-20 IMAGING — CT CT CARDIAC CORONARY ARTERY CALCIUM SCORE
3 series · 14 of 20 positions shown, 16 images · non-contrast
Comparison: None.

CLINICAL DATA: 44-year-old Caucasian male with history of
hyperlipidemia and family history of heart disease.

EXAM:
CT CARDIAC CORONARY ARTERY CALCIUM SCORE
TECHNIQUE: Non-contrast imaging through the heart was performed using
prospective ECG gating. Image post processing was performed on an
independent workstation, allowing for quantitative analysis of the
heart and coronary arteries. Note that this exam targets the heart
and the chest was not imaged in its entirety.

[Series 2: calcium scoring 2.00 qr36 bestdiast 71% hrt calciu · axial · 0.36mm/px · z∈[+1652,+1724]mm · 4 of 60 slices shown]
[im 12/60  vessel]
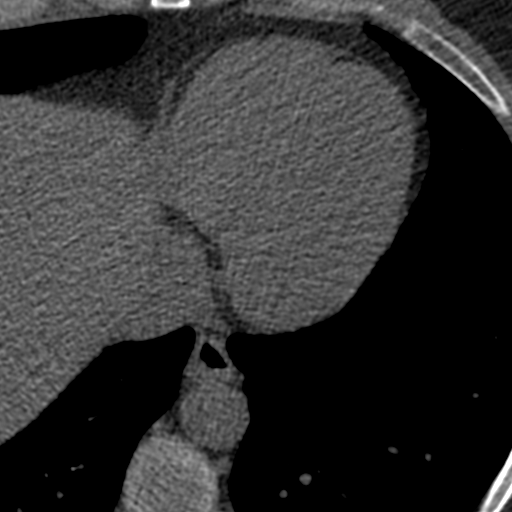
[im 24/60  vessel]
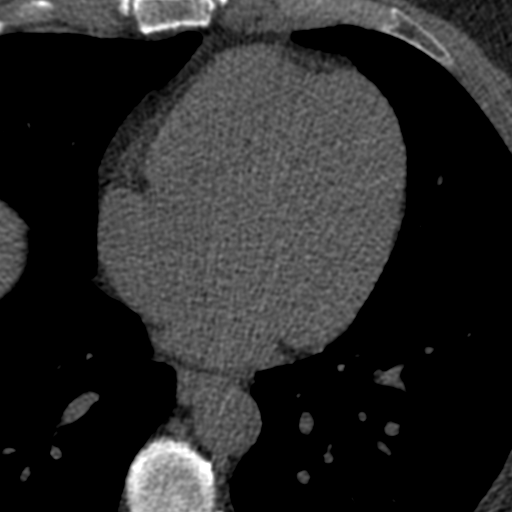
[im 36/60  vessel]
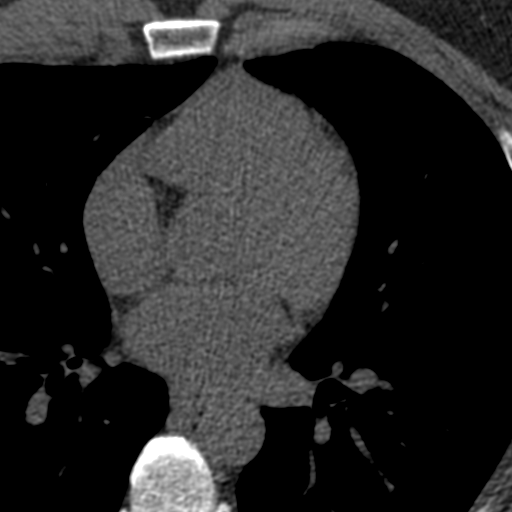
[im 48/60  vessel]
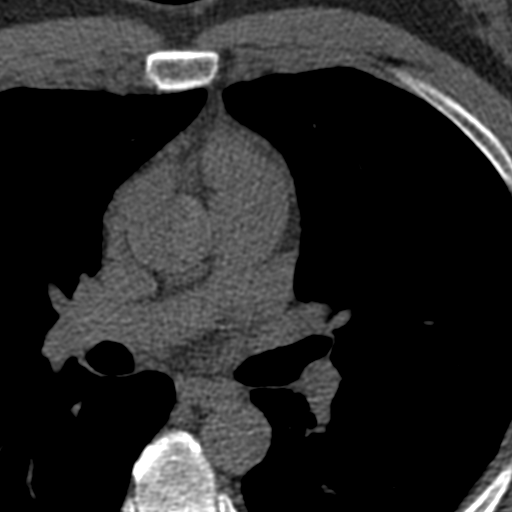

[Series 3: calcium scoring 2.00 br40 bestdiast 71% axial · axial · 0.60mm/px · z∈[+1648,+1728]mm · 5 of 60 slices shown, 7 images]
[im 10/60  vessel]
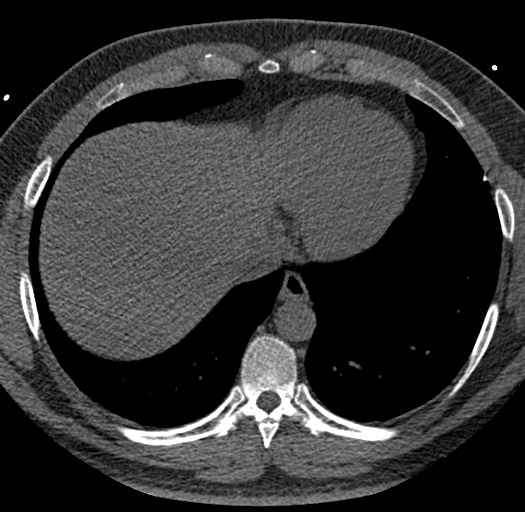
[im 10/60  lung]
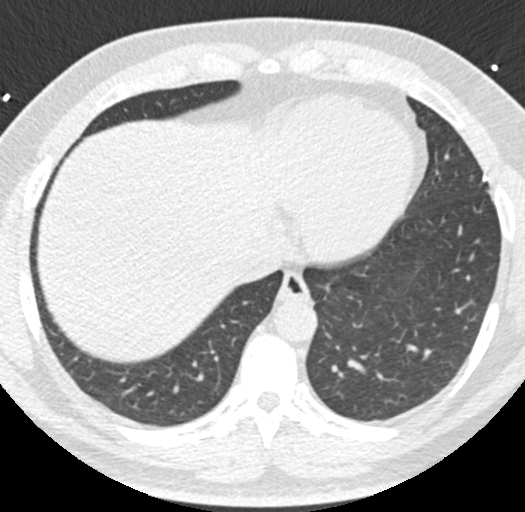
[im 20/60  vessel]
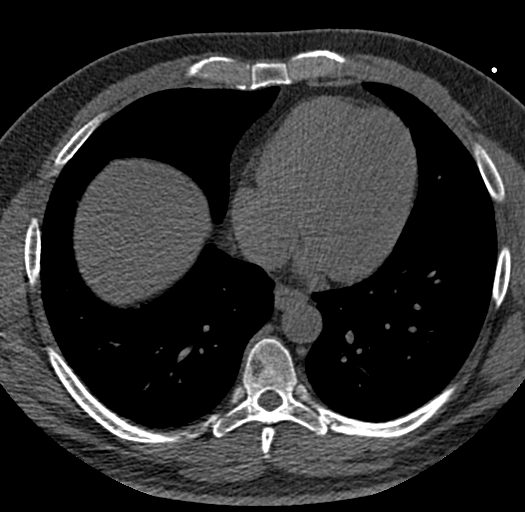
[im 30/60  vessel]
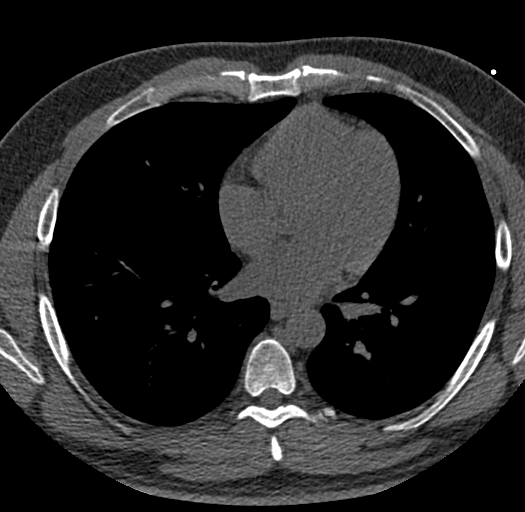
[im 40/60  vessel]
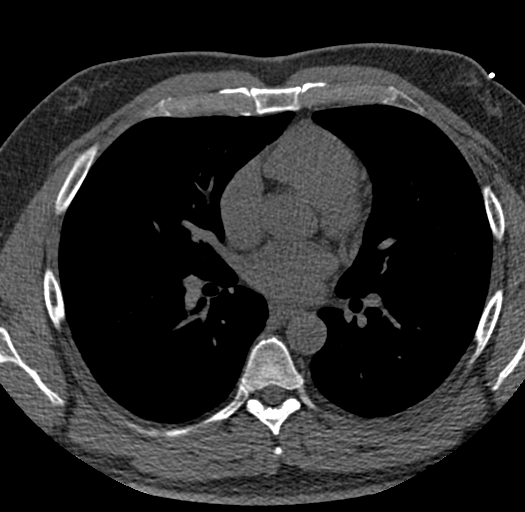
[im 50/60  vessel]
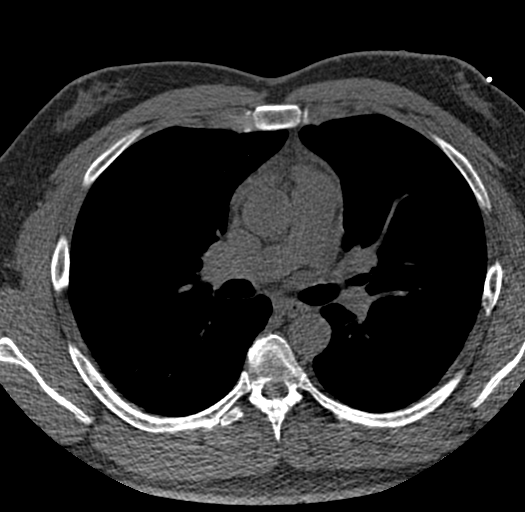
[im 50/60  lung]
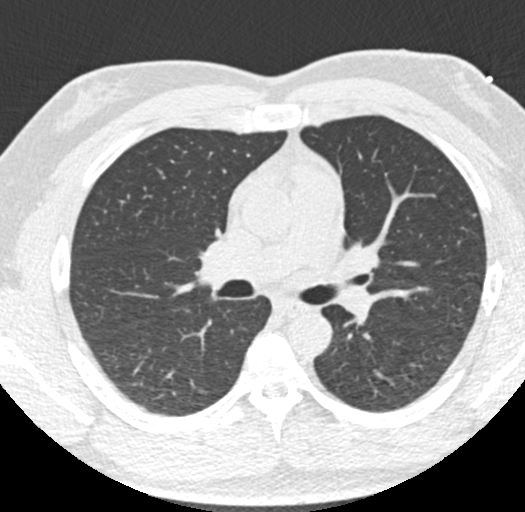

[Series 9: calcium scoring 2.00 br60 bestdiast 71% lungs · axial · 0.60mm/px · z∈[+1648,+1728]mm · 5 of 60 slices shown]
[im 10/60  vessel]
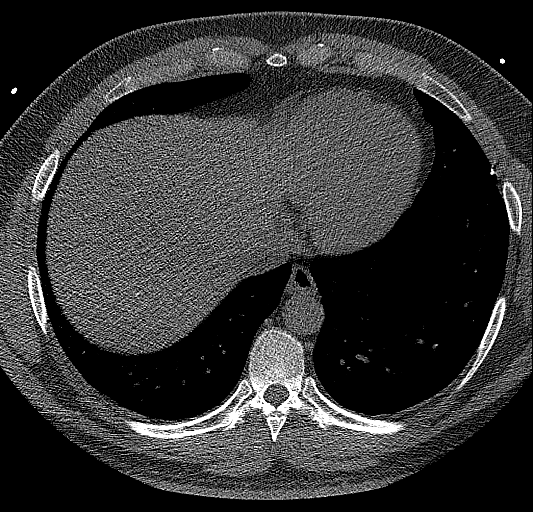
[im 20/60  vessel]
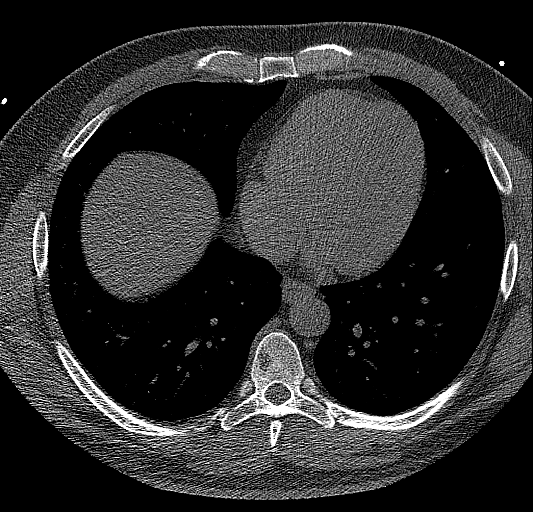
[im 30/60  vessel]
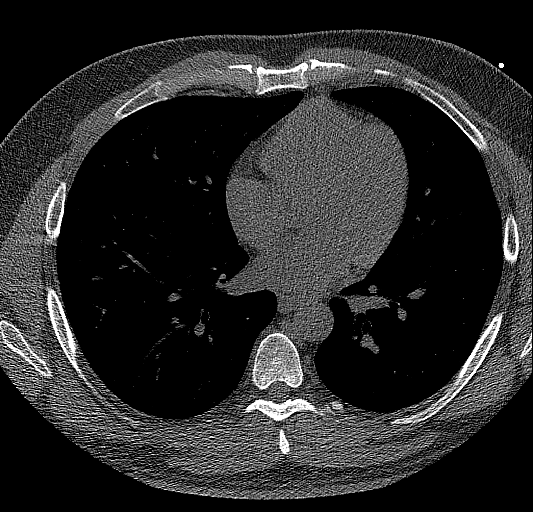
[im 40/60  vessel]
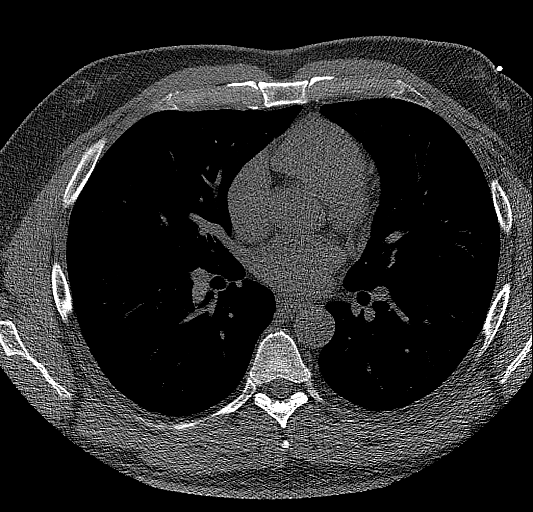
[im 50/60  vessel]
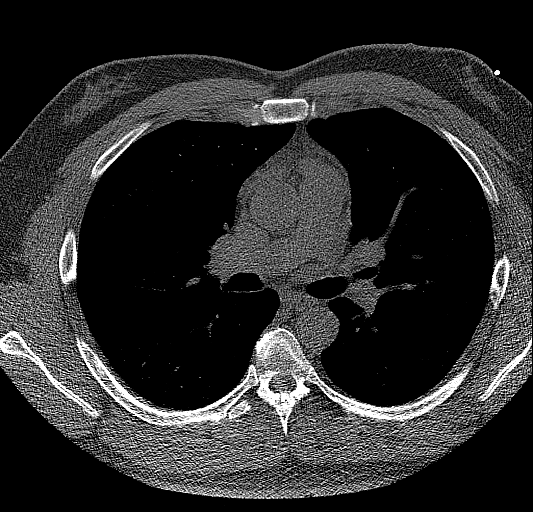

[14 of 20 positions shown; findings below may reference images not displayed]

FINDINGS: CORONARY CALCIUM SCORES:

Left Main: 0

LAD: 0

LCx: 0

RCA: 0

Total Agatston Score: 0

[HOSPITAL] percentile: 0

AORTA MEASUREMENTS:

Ascending Aorta: 28 mm

Descending Aorta: 24 mm

OTHER FINDINGS:

The heart size is within normal limits. No pericardial fluid is
identified. Visualized segments of the thoracic aorta and central
pulmonary arteries are normal in caliber. Visualized mediastinum and
hilar regions demonstrate no lymphadenopathy or masses. Visualized
lungs show no evidence of pulmonary edema, consolidation,
pneumothorax, nodule or pleural fluid. Visualized upper abdomen and
bony structures are unremarkable.
IMPRESSION: Coronary calcium score of 0.

## 2023-06-30 ENCOUNTER — Telehealth: Payer: Self-pay | Admitting: Psychiatry

## 2023-06-30 DIAGNOSIS — L299 Pruritus, unspecified: Secondary | ICD-10-CM

## 2023-06-30 NOTE — Telephone Encounter (Signed)
Pt LVM @ 3:03p requesting refill of Hydroxyzine 3 times a day sent to [facility].  He said his script expired a year ago and he doesn't know if the medicine is still potent.  He also ask for 3 month script.  Next appt 9/10

## 2023-07-01 MED ORDER — HYDROXYZINE HCL 10 MG PO TABS
10.0000 mg | ORAL_TABLET | Freq: Three times a day (TID) | ORAL | 0 refills | Status: DC | PRN
Start: 2023-07-01 — End: 2023-08-02

## 2023-07-01 NOTE — Telephone Encounter (Signed)
Rx sent 

## 2023-07-07 ENCOUNTER — Ambulatory Visit (INDEPENDENT_AMBULATORY_CARE_PROVIDER_SITE_OTHER): Payer: 59 | Admitting: Psychiatry

## 2023-07-07 ENCOUNTER — Encounter: Payer: Self-pay | Admitting: Psychiatry

## 2023-07-07 DIAGNOSIS — F1211 Cannabis abuse, in remission: Secondary | ICD-10-CM | POA: Diagnosis not present

## 2023-07-07 DIAGNOSIS — F4001 Agoraphobia with panic disorder: Secondary | ICD-10-CM

## 2023-07-07 DIAGNOSIS — F902 Attention-deficit hyperactivity disorder, combined type: Secondary | ICD-10-CM

## 2023-07-07 DIAGNOSIS — G251 Drug-induced tremor: Secondary | ICD-10-CM | POA: Diagnosis not present

## 2023-07-07 DIAGNOSIS — F5105 Insomnia due to other mental disorder: Secondary | ICD-10-CM | POA: Diagnosis not present

## 2023-07-07 DIAGNOSIS — L299 Pruritus, unspecified: Secondary | ICD-10-CM

## 2023-07-07 DIAGNOSIS — F5113 Hypersomnia due to other mental disorder: Secondary | ICD-10-CM | POA: Diagnosis not present

## 2023-07-07 DIAGNOSIS — G4733 Obstructive sleep apnea (adult) (pediatric): Secondary | ICD-10-CM

## 2023-07-07 DIAGNOSIS — F3110 Bipolar disorder, current episode manic without psychotic features, unspecified: Secondary | ICD-10-CM | POA: Diagnosis not present

## 2023-07-07 DIAGNOSIS — F411 Generalized anxiety disorder: Secondary | ICD-10-CM | POA: Diagnosis not present

## 2023-07-07 MED ORDER — MODAFINIL 200 MG PO TABS
ORAL_TABLET | ORAL | 0 refills | Status: DC
Start: 2023-07-07 — End: 2023-08-14

## 2023-07-07 NOTE — Progress Notes (Signed)
Cody Short [id number] 12-22-1975 47 y.o.    Subjective:   Patient ID:  Cody Short is a 47 y.o. (DOB 1976/07/14) male.  Chief Complaint:  Chief Complaint  Patient presents with   Follow-up    Mood and meds     Cody Short presents to the office today for follow-up of several psychiatric diagnoses ...    seen August 2020.  For obsessive anxiety  Switched Lexapro 5 mg daily on May 18.  But he stated it made him hungry and so it was stopped in August.  He continued on Saphris 10 mg nightly, lithium 1200 mg daily, propranolol as needed tremor.  Lithium level was ordered.  He called Sept saying he was manic.  Had a lithium level 0.7 and so dose was increased to 1500 mg daily.    seen December 21, 2019.  He had some ongoing manic symptoms and Saphris was increased to 15 mg nightly.  02/07/2020 appointment with the following noted: He has made multiple phone calls to the office since that date.  He decided he wanted to stop lithium because of urinary problems.  He was warned this could lead to mania but he wanted to pursue it anyway.  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  Cody Short had been having bedwetting episodes which she attributed to the lithium.  He has not consistently taken Saphris 15 mg at bedtime since the office visit in February. He completely stopped lithium January 30, 2020 U frequency is much better and less thirsty.  Bedwetting resolved.  Reduced BM to 1-2 times daily.  Gassy.  Glucose is high.  B is hospitalized with unknown disease.  Insulin adjusted. Mental status is best it's ever been.  Less mania.  Less obsessing.  No worsening mania. Is taking Saphris 15 mg HS> Plan no med changes.  04/10/2020 appointment with the following noted: No longer on lithium.  Still frequent urination and thirst DT DM poorly controlled despite meds.  Endo says little else to take bc history of pancreatitis.   Says he goes to urinate q 15 mins. Plan: Wean Saphris in crossover to loxapine 150 mg HS over 2 weeks bc he and endocrinologist believe it's likely Saphris is cause of the inability to get his glucose to acceptable levels.   06/07/20 appt with the following noted: He's called a couple of times CO sleepiness and reduced loxapine to 50 mg HS. Started Vegan diet and BS is better 4 weeks ago.  Thinks glucose is better off the Saphris. Lost 6 #.   CO sleepiness and sleep 16 hours   He thinks it also drove up pulse and blood pressure. Asks about taking Saphris 10 and a second medication. Mood has been fine.   Plan: He wants to restart Saphris 10 mg HS and use a 2nd mood stabilizer. Start Saphris and reduce Loxapine  To 10 mg at night DT prior failure of 10 Mg Saphris with lithium.  06/27/2020 phone call from patient: Pt called to report Loxapine & Saphris is causing his BP to be very high and Saphris is causing blood sugar to be high. Apt 10/18. Need changes before then. Call back @ [phone]  MD response: He had wanted to go back on Saphris and reduce loxapine which we did.  Tell him to stop the Saphris and increase loxapine to 20 mg nightly.  He did not tolerate 50 mg of loxapine very well. Nurse TC  withpt: Spoke with Cody Short and he will stop the Saphris, but feels the increase in the Loxapine to 20 mg will increase his blood sugars more. Explained that with coming off the Saphris he needs to increase it a bit or he will not be stable with his moods. Advised him to stop the Saphris and would check with Dr. Jennelle Human about any other recommendations.   07/17/20 appt with the following noted: Doing terrible.  Needs melatonin to sleep.  Claims loxapine is elevating BP, pulse, TG, cholesterol, glucose.  Claimed Saphris did it too.   Sees PCP at end of October.   Started Vegan diet about 6 weeks ago and exercising.   A/P: There are no available mood stabilizers that do not have a warning of elevating blood sugar  and cholesterol.  He has tried all the mood stabilizers that do not have this warning.  All of the available mood stabilizers left that have any effectiveness are antipsychotics and the FDA has required a class warning on all antipsychotics about blood sugar and cholesterol effects.  This is true despite the fact that not all antipsychotics are equal at increasing these risks.  The patient's reading of these potential side effects is complicating finding an effective treatment.  The only way to prove whether or not these meds are elevating his blood sugar and cholesterol is to stop the medications for a period of time.  This exposes him to manic risk but there is no chance of finding an adequate mood stabilizer that will satisfy him until we can prove whether or not these meds are in fact affecting his blood sugar, cholesterol, blood pressure, and pulse. Therefore he will stop the loxapine which is at a low dose. We will check labs in 3 weeks.  We will attempt follow-up in 4 weeks.  If he gets manic he will let us know.   08/13/2020 appointment with the following noted: Blood sugar is pretty much the same off the loxapine but BP and pulse improved from 110 to 70 range.  But had started BP med about mid September. Started counselor Illene Labrador at [facility] and started nutritionsist.  Has changed and restricted diet. Sees PCP 07/24/20.  Endo is managing DM.   Mood has been really good.  Counseling helped the anxiety.  Getting 10 hours of sleep.  PCP suggested changing time going to bed earlier and it's helping.   Sometimes has racing thoughts and gets comments from others while playing video games that he needs to take a break. Patient reports stable mood and deniesr irritable moods.  Denies appetite disturbance.  Patient reports that energy and motivation have been good.  Patient denies any difficulty with concentration.  Patient denies any suicidal ideation.  Admits he's easily  stressed.   10/09/20 appt with following noted: Getting manic as hell and depressed as hell. Talking too much and obsessing over things.  Talking too loud and doing things extremely fast.  Only depressed a couple of days ago.  Manic sx are brief and not all the time. Sleep 7-8 hours and sleep schedule is more normal. Stayed on Caplyta and pleased overall with SE. BS is better but hypertension is ongoing. Low TSH. Still counseling. No delusions hallucinations since off drugs. Give samples of Caplyta 42 mg once daily. Prefer no change befor ethe holidays bc he is not continuously manic or depressed. Likely switch to Jordan after the holidays.  11/19/2020 appointment with the following noted: Best I've ever been  except some controllable manic.  Able to do things fast and easily.  Anxiety medicine and counseling has helped.  Better function.  No SE. Not depressed in a while.  But not sure the Capylta helps mania.   Lost from 230# to 189# with diet and exercise over a long period.   Sleep 4-8 hours, but not that I can't sleep but don't feel like sleeping.  Not itching any more.  I'm a a whole new person.   I can't keep up the mania.  Driving my parents and friends crazy. Blood sugar better and off insulin.  Only taking metformin. Can't afford Caplyta. So feels he must change. Hydroxyzine really helped anxiety. Plan: DC Caplyta DT mania.  switch to Latuda 40 then 80 mg with food.  Pick up samples.  12/03/2020 phone call from parents:Parents called and want dr. Jennelle Human to know that Cody Short is maniac and he is having physical aggression and all he does is play video games 24 hours a day and is not sleeping. Parents feel like Cody Short is not giving you the whole picture and needs an adjustment to his medicine. MD response:atient acknowledged that his appointment on January 24 that he was manic while taking Caplyta.  He also acknowledged that his family saw him as being manic.  We discontinued Caplyta and  switched him to Jordan.  If he made the switch and increase the dose in a timely manner he should have been on 80 mg daily for a week now.  His parents are complaining that he is still manic.  Provided he is tolerating the Latuda reasonably well have him increase to 120 mg daily.  He can take 1-1/2 of the 80 mg tablets until he runs out and then we can send in a refill for 120 mg daily.  Remind him he needs to take that with at least 350 cal. Phone call with patient from nurse: Patient rtc and he did report he's doing great on the medication but is still having mania. Reports staying up all night playing video games then asleep about 5 am and back up at noon. He is taking medication with 350 calories also. Advised him to increase to 120 mg Latuda daily. He reports having only 40 mg tablets at home so instructed him to take 3 tablets and I would get more samples out for him to pick up tomorrow. He agreed and verbalized understanding.  12/07/2020 phone call: Patient called again about his Latuda. He states that it makes him really hungry after he takes it, he takes a nap and wakes up hungry again. He would like to know would it be okay to take before bed instead. MD response: He is unlikely to comply with recommendations about routine sleep and wake times.  He is in a habit of playing video games late into the night.  If he prefers he can split the dosage of the Latuda between breakfast and another meal.  But it is very important that he get the full dose in every day and take it with 350 cal. Nursing phone call with patient:Rtc to patient and he reports he has to take it all at hs because it makes him sleepy, I advised him to continue that regimen. He also reports not sleeping well but he's been drinking caffeine later in the day. Advised him to not drink any caffeine past 2-3 pm. He agreed. He reports his blood sugars have been elevated some, not as bad but it is up  some.   12/20/2020 appointment with the  following noted: Mania tremendously better but occ anger outbursts anger.  6 hours of sleep with melatonin, hydroxyzine and Latuda 120 mg for 2 weeks.  Staying up all night playing video games. Kasandra Knudsen does make him hungry right after he takes it so takes it before bedtime.  Taking with food. Hyperactivity and loud talking is better.  I can function fast.  Parents see benefit with mania but are concerned about his anger outbursts but he things she takes stress out on him.  Blood sugar is generally good except when eats too much.  Taking it with dinner and HS.   Able to drive around town.  Can cook and clean.  Less ruminating on his past.  Less need for direction.   Counseling is helping at [facility] counseling. Hydroxyzine helped itching and anxiety.  Plan: Continue Latuda 120 mg PM.  Is getting benefit but only been on it for a couple of weeks.   01/17/2021 appointment with following noted: So so.  Some anger outburts a couple of times.  Eats too much.  Blood sugar is pretty bad.  Up at night playing video games and then sleeps too late in the day.  Takes Latuda at evening meal.  Wants to blame meds for these behaviors. Otherwise mood is "good I guess".  No serious highs or lows.  Can get in arguments with mother when she tries to correct him and may feel guilty afterwards. Takes hydroxyzine q 6 hours because of anxiety and bc dx thyroid inflamed, thyroiditis.  Asks if there's anything to help with overeating. Lost weight from thyroid.   Plan: Continue Latuda 120 mg as he is tolerating it and it appears to be controlling mania better than did the Caplyta and he is tolerating it better.  02/12/2021 appointment with the following noted: Perfectly good with mood and depression.  Needs hydroxyzine for itching and anxiety.  Energy poor with thyroiditis. Will sleep too much too bc gets bored and no physical activity. Varies 9-16 hours daily.  Can nap easily.  Not spacy.   No anger  lately. Tolerating Latuda well.   Still eats too many sweets late at night and gets high blood sugars.   Plan: Increase  topiramate 50 mg BID off label for weight loss and disc SE.  He wants to try something.  03/20/2021 appointment with the following noted: Taking meds.  A little benefit with increase topiramate. Mood is perfectly fine without mania nor depression. Blood sugar is a bit high. Claims doctor thinks rash could be related to [facility] but he sleeps all the time bc hypothyroidism. Has folliculitis Started treatment for it and it is helping.   Thyroid problem is a wait and see.  Could take a year to get better. Doesn't think topiramate added tiredness. Not taking hydroxyzine much about once daily. Dealing with difficult GM threatening sui if left alone.  Plan: Continue Latuda 120 mg PM.  Is getting benefit.. Better tolerated than others so far but he does say it makes him hungry but he has chronic impulse control problems including overeating.  05/06/2021 appointment with the following noted: Quit topiramate DT nausea and not helping at all. Rash resolved. Lipids not terrible but a little up.  TSH normalized. HGBA1C 9.3 Not exercising. Not doing much re: work etc other than video games. Not manic lately. Patient reports stable mood and denies depressed or irritable moods.  Patient denies any recent difficulty with anxiety.  Denies appetite disturbance.  Patient reports that energy and motivation have been good.  Patient denies any difficulty with concentration.  Patient denies any suicidal ideation. Energy is better than it was but still sleeping 12 hours daily and up at night playing videos with friends. Plan: Reduce Latuda to 80 mg to reduce excessive sleepiness.  If mania then increase it back up 120 mg PM.  Is getting benefit..  06/20/21 TC:  Pt stated the cramps have spread to his arms and legs.He has been on latuda 80 mg qd for awhile now.He stated he is also going to see a  cardiologist due to heart spasms. MD response:  The cramps are not likely related to Jordan.  Usually it is related to dehydration although I see from chart review that he had elevated creatinine kinase from a statin and that was probably causing some of his problem.  He needs to make sure he drinks lots of water.  Especially because he is diabetic and that will make him prone to dehydration if he is not controlling his blood sugar. However the sleepiness might be related to Jordan.  He had previously been on 120 mg daily and we reduced it to 80 mg daily and if he is done okay with his mood we can try reducing it 1 more time to 60 mg.  I will send in a prescription for the 60 mg tablet and he can reduce it if he would like to see if the sleepiness is related to Jordan.  If Kasandra Knudsen is contributing to sleepiness it will get better if he reduces the dose.  If the sleepiness does not get better with the dosage reduction then Latuda is not the cause Reduce Latuda to 60 mg daily  07/30/2021 appt noted: Not much difference in alertness Mania since reducing Latuda. Hypoactive bc thryroid problems and may be the cause of the cramps per the other doctors. Itches badly without hydroxyzine. Spent $500 on gambling game and adeleted.  Past history of drug induced gambling but no major drugs in 15 years. Still exhausted and sleep all day.  But is taying up at night.  Sleeping 16 hours daily.  Wonders if hydroxyzine 25 is making him tired. Itching worse with stress and stress of GM and B crazy. Asks about bipolar shot. Side sleeper and as far as he knows no excessive snoring.  08/23/2021 phone call from patient's father stating that Cody Short had "gone off the rails" and it spent $1000 on the night prior doing online gambling and had gone back to staying up all night and sleeping during the day.  He had also been more verbally abusive.  It was suggested that the Latuda dose be increased from 60 to 80 mg in the  evening.  10/09/2021 appointment with the following noted: Sleeping a lot and easily exhausted even after med were changed.   SE a little constipation. History of problems with gambling in the past but not current.  Not thinking of it now. No other manic sx currently.   On Latuda 80 daily now.  Reduced hydroxyzine to 10 and still sleepy.  No other psych meds Plan: Continue Latuda 80 mg bc manic with less .  If recurrence of mania then increase it back up 120 mg PM.  Is getting benefit..  11/18/2021 phone call: Patient had complained of elevated CK levels and wondered if it could be related to Latuda.  Dr. Jennelle Human called Dr. Duanne Guess and discussed this issue.  It appears  that his CK levels have actually been declining over the last several months and are not at a critical level.  It was agreed that this could be monitored over time and no immediate med change was necessary.  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   1 episode when got angy and said awful things to mother but then apologized.  Only 1 bad day. Otherwise mood has been pretty stable lately. Constipation and can't take miralax bc gives him diarrhea.   Sleep normalized to 8 hours since started his job.  So the med was not the source of the sleepiness. Counselor next Tuesday Only hydroxyzine is 10 mg HS Only other psych med is Latuda 80 mg daily Plan: Latuda  Been the best med so far for him.     Sleepiness was not better with reduction in Latuda from 120 to 60 mg daily. Continue Latuda 80 mg bc manic with less .  Option split dose to help sleepiness .   03/12/22 appt noted: Taking hydroxyzine BID Sleep study next week. 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  At [employer]. Doing well emotionally. Constipation and GI doc tomorrow DT fissure. BS is better A1C 7.4 instead of 9+ Sleep 8 hours.   The only thing he doesn't like is 4 meals daily bc hard to lose weight. Not much mania nor depression. Still anxious and worse without hydroxyzine. Consistent with Latuda at bedtime with food.   Rash bettter .  Itching managed with BID hydroxyzine. Plan: Continue Latuda 80 mg bc manic with less .  Option split dose to help sleepiness .  If recurrence of mania then increase it back up 120 mg PM.  Is getting benefit..  06/23/22 appt noted: Rough 2 weeks.  Larey Seat for 45 yo girl at work and was somewhat manic with her and she quit the job.  But is beginning to feel better now. Dx OSA CPAP and tremendous difference.  Not sleeping as much and more productive at work.  But still some initial sleepiness in the AM Not constipation or diarrhea but slow to move bowels but takes several times in the AM. Started Colace  2 daily for the last couple of days. Lost 10-15# and now stagnant. Working and eating less and weights off and on 3 weeks and trouble losing weight. At work asking too many questions and is too anxious.  Wonders about more hydroxyzine. Sober for 12 years. Can't split Latuda bc gets too sleepy and takes it too much at night. Had same job 6 mos.  Got employee of the month. Plan: no changes  07/08/22 tC CO more manic sx 07/30/22 TC: Pt stated generic latuda is not working for him.He is having manic symptoms such as cleaning his whole house,getting into arguments with his mom and getting agitated very easily.He also gambled $300.He is taking 80 mg  MD response : incr Latuda to 120 mg daily  09/25/22 appt noted: It made all the difference in the world with incr Latuda to 120 mg daily.  No sig mania or depression now. Increased stool softeners to 3 twice daily.  BS better with Jardiance and wt loss 19#. Tolerating  Latuda.    No delusions or visions like before and no trouble some intrusive thoughts. Rash is better with clindamycin solution. Busy with work.  [employer] employee of the month twice at [employer].  37 hours per month. Sleep 8-9 hours. Hydroxyzine 10 HS helps sleep and can't take in daytime. SE can't lose wt on Latuda with medical help. Plan no med changes  02/04/23 TC: Patient is taking hydroxyzine and Latuda. He says he takes as prescribed, taking with enough calories. There was a work situation that caused a lot of stress and he was able to talk to someone to calm him down and provide clarity on the situation. He said that he spent $1000 on a gambling game that didn't even provide any pay out. He also said he got into an argument with 2 different customers and that he doesn't usually do this. He is also describing depression. He said he is sleeping 9 hours a day. Patient does not have pressured or rapid speech, seems to have a calm affect, but is concerned.     MD resp:  I'm out of town.  Only option I can give now is to increase the Latuda to 1 and 1/2 of the 120 mg tabs temporarily until mania resolves     02/04/23 appt noted: in person Increased Latuda to 180 mg daily since ehre bc manic sx. Hydroxyzine 10 HS hangover so taking 10 HS Now also Lautda 120 mg daily. Doing good overall.  No sig mania lately. Few days mild dep but not bad.   Some constipation issues.  Takign stool softeners.   Therapy with Elio Forget [facility] is helping a lot. Wonders if Jordan speeds up sex response.  No compulsive sex behaviors. Lost $7000 on gambling game.  Got banned from game and no other gambling problems now.   B drug addict and causes problems and asking for money and lying.   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   Sleep 8 hours.   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 Accutane  several mos.  Rash 90% better. Constipation better with stool softeners. Not much mood swings but dep occ.  7-8 hours sleep. Uses hydroxyzine for anxiety bc it remains high.  Too tired if more than 20 mg at a time. Meds Latuda 120 mg daily and hydroxyzine 10 BID.   Might have to go on Medicaid bc income reasons. Therapy with Elio Forget helped more than any other therapist.   Covid shut down Dad's business. B returned to drug use.  He's neglecting his kid.  B is big stress.   GM died 4 mos ago.   Can be impulsive and prone to addiction.  Prior psychiatric medications: risperidone with cognitive side effects, Abilify NR,  Zyprexa SE glucose, Seroquel 600 mg SE wt, perphenazineSE, Saphris 20 partial response but SE increase glucose , haloperidol EPS, Geodon EPS,  Vraylar, loxapine SE, Caplyta NR, Latuda 120 He's prone to EPS.  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 lithium 1500,   N-acetylcysteine with minimal benefit, , Amantadine with vomiting.  Sertraline and lexapro sexual SE,   Propranolol for tremor,   Ozempic GI px   Review of Systems:  Review of Systems  Constitutional:  Positive for fatigue.  Gastrointestinal:  Positive for constipation and rectal  pain.  Endocrine: Positive for polydipsia and polyuria.  Genitourinary:  Negative for decreased urine volume and frequency.  Musculoskeletal:  Positive for myalgias.  Skin:  Positive for rash.       Itching resolved with hydroxyzine  Neurological:  Negative for tremors.  Psychiatric/Behavioral:  Positive for decreased concentration. Negative for agitation, behavioral problems, confusion, dysphoric mood, hallucinations, self-injury, sleep disturbance and suicidal ideas. The patient is hyperactive.     Medications: I have reviewed the patient's current medications.  Current Outpatient Medications  Medication  Sig Dispense Refill   Accu-Chek Softclix Lancets lancets Use as instructed to check blood sugar 4 times daily. DX:E11.65 300 each 3   Adapalene-Benzoyl Peroxide 0.1-2.5 % gel SMARTSIG:1 Application Topical Every Evening     Blood Glucose Monitoring Suppl (CONTOUR NEXT MONITOR) w/Device KIT Check sugar 4 times daily 1 kit 0   Blood Glucose Monitoring Suppl (FREESTYLE LITE) DEVI Use as instructed to check sugar 4 times daily 1 each 0   Docusate Sodium (COLACE PO) Take 2 tablets by mouth daily.     empagliflozin (JARDIANCE) 25 MG TABS tablet TAKE 1 TABLET DAILY BEFORE BREAKFAST 90 tablet 0   famotidine (PEPCID) 40 MG tablet Take 40 mg by mouth 2 (two) times daily.     fenofibrate (TRICOR) 145 MG tablet TAKE 1 TABLET BY MOUTH EVERY DAY 90 tablet 0   glucose blood (ACCU-CHEK GUIDE) test strip USE AS INSTRUCTED TO CHECK BLOOD SUGAR 4 TIMES A DAY 300 strip 3   hydrOXYzine (ATARAX) 10 MG tablet Take 1 tablet (10 mg total) by mouth 3 (three) times daily as needed. 270 tablet 0   insulin aspart (NOVOLOG FLEXPEN) 100 UNIT/ML FlexPen Inject 5-20 Units into the skin in the morning, at noon, in the evening, and at bedtime. 30 mL 3   Insulin Pen Needle (BD PEN NEEDLE NANO 2ND GEN) 32G X 4 MM MISC USE 4 TIMES A DAY WITH     INSULIN PENS 360 each 3   LEVEMIR FLEXPEN 100 UNIT/ML FlexPen INJECT 30 UNITS            SUBCUTANEOUSLY DAILY 30 mL 2   Lurasidone HCl 120 MG TABS TAKE 1 TABLET DAILY WITH   BREAKFAST (DOSE CHANGE) 90 tablet 1   metFORMIN (GLUCOPHAGE-XR) 500 MG 24 hr tablet TAKE 2 TABLETS (1,000MG     TOTAL) 2 TIMES DAILY WITH  MEALS 360 tablet 0   modafinil (PROVIGIL) 200 MG tablet 1/2 tablet in the AM for 1 week then 1 each AM 30 tablet 0   valsartan (DIOVAN) 160 MG tablet Take 160 mg by mouth daily.     ezetimibe (ZETIA) 10 MG tablet TAKE 1 TABLET (10 MG TOTAL) BY MOUTH DAILY AFTER SUPPER. 90 tablet 0   No current facility-administered medications for this visit.    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 Recurs if cannot walk for a week.      TOBACCO ABUSE, HX OF 07/31/2009    Family History  Problem Relation Age of Onset   Diabetes Mother    Breast cancer Mother        was told not hereditary   Cirrhosis Mother        fatty liver   High blood pressure Mother    High blood pressure Father    Diabetes Father    Melanoma Maternal Uncle    Colon cancer Neg Hx    Stomach cancer Neg Hx    Pancreatitis Neg Hx    Heart disease Neg Hx    Kidney disease Neg Hx    Liver disease Neg Hx     Social History   Socioeconomic History   Marital status: Single    Spouse name: Not on file   Number of children: 0   Years of education: Not on file   Highest education level: Bachelor's degree (e.g., BA, AB, BS)  Occupational History   Not on file  Tobacco Use   Smoking status: Former    Current packs/day: 0.00    Average packs/day: 2.5 packs/day for 10.0 years (25.0 ttl pk-yrs)    Types: Cigarettes    Start date: 10/27/1996    Quit date: 10/27/2006    Years since quitting: 16.7   Smokeless tobacco: Never  Vaping Use   Vaping status: Never Used  Substance and Sexual Activity   Alcohol use: Not Currently    Comment: none at all   Drug use: No    Sexual activity: Not on file  Other Topics Concern   Not on file  Social History Narrative   Single. Not dating currently. No kids.    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   Please see review of systems for further details on the patient's review from today.   Objective:   Physical Exam:  There were no vitals taken for this visit.  Physical Exam Constitutional:      General: He is not in acute distress. Musculoskeletal:        General: No deformity.  Neurological:     Mental Status: He is alert and oriented to person, place, and time.     Cranial Nerves: No dysarthria.     Coordination: Coordination normal.  Psychiatric:        Attention and Perception: Attention and perception normal. He does not perceive auditory or visual hallucinations.        Mood and Affect: Mood is anxious. Mood is not depressed or elated. Affect is not labile, blunt, angry or inappropriate.        Speech: Speech normal. Speech is not rapid and pressured or  slurred.        Behavior: Behavior normal. Behavior is not agitated, aggressive or hyperactive. Behavior is cooperative.        Thought Content: Thought content normal. Thought content is not paranoid or delusional. Thought content does not include homicidal or suicidal ideation. Thought content does not include homicidal or suicidal plan.        Cognition and Memory: Cognition and memory normal.        Judgment: Judgment normal.     Comments:  No paranoia.   Insight and judgment fair to poor. Chronically self-preoccupied and hyperthymic style better but not gone. Much Less pressured and less manic  talkative       Lab Review:     Component Value Date/Time   NA 139 05/17/2021 1952   K 3.8 05/17/2021 1952   CL 105 05/17/2021 1952   CO2 23 05/17/2021 1952   GLUCOSE 88 05/17/2021 1952   BUN 12 05/17/2021 1952   CREATININE 1.10 05/17/2021 1952   CREATININE 1.24 06/18/2018 1502   CALCIUM 9.2 05/17/2021 1952   PROT 7.5 05/17/2021 1952   ALBUMIN 4.7 05/17/2021 1952   AST 54 (H) 05/17/2021 1952   ALT 45 (H) 05/17/2021 1952   ALKPHOS 65 05/17/2021 1952   BILITOT 0.3 05/17/2021 1952   GFRNONAA >60 05/17/2021 1952   GFRNONAA 84 04/02/2018 1551   GFRAA 97 04/02/2018 1551       Component Value Date/Time   WBC 7.9 05/17/2021 1952   RBC 4.60 05/17/2021 1952   HGB 13.6 05/17/2021 1952   HCT 40.8 05/17/2021 1952   PLT 243 05/17/2021 1952   MCV 88.7 05/17/2021 1952   MCH 29.6 05/17/2021 1952   MCHC 33.3 05/17/2021 1952   RDW 13.2 05/17/2021 1952   LYMPHSABS 1.9 06/27/2016 1441   MONOABS 0.8 06/27/2016 1441   EOSABS 0.2 06/27/2016 1441   BASOSABS 0.1 06/27/2016 1441    Lithium Lvl  Date Value Ref Range Status  09/09/2019 1.1 0.6 - 1.2 mmol/L Final    12/14/19 lithium level 0.6 (trough 13 hours) , normal BMP and calcium   No results found for: "PHENYTOIN", "PHENOBARB", "VALPROATE", "CBMZ"   .res Assessment: Plan:    Jadrien "Cody Short" was seen today for follow-up.  Diagnoses and all orders for this visit:  Bipolar I disorder, most recent episode (or current) manic (HCC)  Generalized anxiety disorder  Panic disorder with agoraphobia  Insomnia due to mental condition  Marijuana abuse in remission  Tremor due to multiple drugs  Hypersomnia due to other mental disorder  Itching  Attention deficit hyperactivity disorder (ADHD), combined type -     modafinil (PROVIGIL) 200 MG tablet; 1/2 tablet in the AM  for 1 week then 1 each AM  Obstructive sleep apnea -     modafinil (PROVIGIL) 200 MG tablet; 1/2 tablet in the AM for 1 week then 1 each AM    Cody Short has had significant disruptive mood and psychotic symptoms and substance abuse over the course of his treatment here.  It has been evident in chronic occupational dysfunction and some relational problems.  He is also been very prone to EPS and elevated blood sugars from atypicals making it difficult to get adequate mood stabilization.   Has a history of severe pancreatitis from Depakote.  He did not have an adequate response to carbamazepine.   lithium was insufficient to control his symptoms in monotherapy and he developed diabetes insipidus.  There are no available mood stabilizers that do  not have a warning of elevating blood sugar and cholesterol.  He has tried all the mood stabilizers that do not have this warning.  All of the available mood stabilizers left that have any effectiveness are antipsychotics and the FDA has required a class warning on all antipsychotics about blood sugar and cholesterol effects.  This is true despite the fact that not all antipsychotics are equal and increasing these risks.  The patient's reading of these potential side effects is complicating finding an effective treatment.  The only way to prove whether or not these meds are elevating his blood sugar and cholesterol is to stop the medications for a period of time.  This exposes him to manic risk but there is no chance of finding an adequate mood stabilizer that will satisfy him until we can prove whether or not these meds are in fact affecting his blood sugar, cholesterol, blood pressure, and pulse.  Latuda  Been the best med so far for him.    Answered questions with no other good options obvious. Sleepiness was not better with reduction in Latuda from 120 to 60 mg daily. Continue Latuda 120 mg PM.  Is getting benefit.Marland Kitchen He still thinks it affects BS but it is  controlled.   .   Discussed potential metabolic side effects associated with atypical antipsychotics, as well as potential risk for movement side effects. Advised pt to contact office if movement side effects occur.   continue Hydroxyzine 10 mg HS prn  This has been helpful for anxiety and itching and may have some mild antimanic effect.  Consider modafinil if necessary for function.  Disc risk of mania.  Disc SE.    He has a low stress tolerance.  He can be easily agitated in public. OK to schedule with Elio Forget for therapy  He has continued abstinence from marijuana is encouraged.  He has a history of cannabinoid hyperemesis which finally convinced him to discontinue the marijuana.  His mood disorder and thought disorganization have improved since being off the marijuana.  He has a very long history of heavy marijuana dependence.   Constipation management effective  Sleep hygiene. Disc OSA and how it affects alertness and started CPAP and much better.    ADD tx is reasonable bc job affected by difficulty with handling two tasks at once.  Forgetful and can't get back on task.  Work restrictions bc of it.  No regular stimulant. Ok trial modafinil off label for this problem.  Also to help OSA. Modafinil 100-then 200 mg AM.  No other med changes  Follow-up 8 weeks bc chronically unstable and typical phone calls between appts. But this has been better lately    Meredith Staggers, MD, DFAPA  Please see After Visit Summary for patient specific instructions.  Future Appointments  Date Time Provider Department Center  07/15/2023  3:00 PM Waldron Session, [facility] None  08/31/2023  3:20 PM Carlus Pavlov, MD [facility] None  12/09/2023  3:00 PM Lomax, Amy, NP [facility] None    No orders of the defined types were placed in this encounter.      -------------------------------

## 2023-07-15 ENCOUNTER — Telehealth: Payer: Self-pay | Admitting: Psychiatry

## 2023-07-15 ENCOUNTER — Ambulatory Visit (INDEPENDENT_AMBULATORY_CARE_PROVIDER_SITE_OTHER): Payer: 59 | Admitting: Mental Health

## 2023-07-15 DIAGNOSIS — F3110 Bipolar disorder, current episode manic without psychotic features, unspecified: Secondary | ICD-10-CM

## 2023-07-15 NOTE — Telephone Encounter (Signed)
Pt LVM 9/17 @ 5:23p requesting status of PA for Modafinil that Dr Jennelle Human prescribed at his last visit. Confirmed pharmacy is [facility].  Next appt 11/13

## 2023-07-15 NOTE — Progress Notes (Signed)
[facility] Note  Name: Cody Short Date:  07/15/23 MRN: [id number] DOB: [birth year] PCP: Lewis Moccasin, MD  Time spent: 50 minutes Time in: 3: 00 PM time out: 3:50 PM  Treatment:   ind. Therapy    Mental Status Exam:    Appearance:    Casual     Behavior:   Appropriate  Motor:   WNL  Speech/Language:    Clear and Coherent  Affect:   Full range   Mood:   Euthymic, anxious  Thought process:   Logical, linear, goal directed  Thought content:     WNL  Sensory/Perceptual disturbances:     none  Orientation:   x4  Attention:   Good  Concentration:   Good  Memory:   Intact  Fund of knowledge:    Consistent with age and development  Insight:     Good  Judgment:    Good  Impulse Control:   Good     Reported Symptoms:  anxiety, rumination, intermittent depressed mood, impulsivity  Risk Assessment: Danger to Self:  No Self-injurious Behavior: No Danger to Others: No Duty to Warn:no Physical Aggression / Violence:No  Access to Firearms a concern: No  Gang Involvement:No  Patient / guardian was educated about steps to take if suicide or homicide risk level increases between visits: yes While future psychiatric events cannot be accurately predicted, the patient does not currently require acute inpatient psychiatric care and does not currently meet [facility] involuntary commitment criteria.  Subjective:  Patient arrived on time.  Assessed progress since last visit.  Patient shared how he is trying to be more cognizant of his reactions at work.  He shared how he often will vent to other employees about customers, recognizing that this might be upsetting to them at times as he feels he does it too often.  He stated that he tries to do it to be humorous but also to destress.  Explored ways to limit this tendency as he identified the need to discontinue the behavior or at least the frequency significantly.  He identified wanting to work on his reactions  at work with customers where he can get easily frustrated.  We discussed some emotional regulatory skills specifically, his taking the time to be mindful of the setting and how he wants to react and respond prior to going into work.  He stated that he also is trying to maintain his abstinence from gambling.  He stated that he is about 5 or 6 months free from any gambling which was primarily on line.  He stated that he set an interpersonal boundaries by having the website block his account so therefore he could not log him.  He stated that he may have 1 or 2 instances per month where he thinks about gambling.  Explored with patient triggers and encouraged him to further identify between visits, noting that he has made substantial progress and encouraged him to recognize how he has been resourceful and successful.   Interventions:  motivational interviewing, CBT    Diagnoses:    ICD-10-CM   1. Bipolar I disorder, most recent episode (or current) manic (HCC)  F31.10         Plan: Patient to utilize coping skills as discussed, continue his medication compliance to maintain mood stability. patient will continue to work on identifying and challenging his obsessive thoughts, particularly regarding relationships, and practicing grounding techniques to manage his anxiety. He will also be encouraged to celebrate his achievements at  work and continue to take ownership of his successes.  Long-term goals:   Maintain symptom reduction: The patient will report sustained reduction in symptoms of anxiety using both CBT and mindfulness interventions for 3 consecutive months progressively.  Improve emotional regulation: The patient will learn and apply CBT and mindfulness-based strategies to regulate emotions, such as mindfulness-based stress reduction and cognitive restructuring, and report an improvement in emotional regulation for at least 3 consecutive months progressively.    Short-term goal:  The patient  will learn and apply CBT and mindfulness-based coping skills for managing anxiety and practice using it between sessions.       2.   The patient will CBT and mindfulness-based interventions to increase awareness of negative thought patterns. 3.   The patient will keep and maintain employment to keep financial stress manageable       4.   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         Waldron Session, [facility]

## 2023-07-16 ENCOUNTER — Telehealth: Payer: Self-pay | Admitting: Psychiatry

## 2023-07-16 DIAGNOSIS — L853 Xerosis cutis: Secondary | ICD-10-CM | POA: Diagnosis not present

## 2023-07-16 DIAGNOSIS — L700 Acne vulgaris: Secondary | ICD-10-CM | POA: Diagnosis not present

## 2023-07-16 DIAGNOSIS — K130 Diseases of lips: Secondary | ICD-10-CM | POA: Diagnosis not present

## 2023-07-16 NOTE — Telephone Encounter (Signed)
Thank you! Patient notified

## 2023-07-16 NOTE — Telephone Encounter (Signed)
Prior Approval received for Modafinil 200 mg with [facility] effective through 07/15/2024

## 2023-07-16 NOTE — Telephone Encounter (Signed)
[facility] approved MODAFINIL 200mg  from 07/16/23-07/15/2024

## 2023-07-20 DIAGNOSIS — L700 Acne vulgaris: Secondary | ICD-10-CM | POA: Diagnosis not present

## 2023-07-23 DIAGNOSIS — G4733 Obstructive sleep apnea (adult) (pediatric): Secondary | ICD-10-CM | POA: Diagnosis not present

## 2023-07-28 ENCOUNTER — Encounter: Payer: Self-pay | Admitting: Internal Medicine

## 2023-08-01 ENCOUNTER — Other Ambulatory Visit: Payer: Self-pay | Admitting: Internal Medicine

## 2023-08-01 ENCOUNTER — Encounter: Payer: Self-pay | Admitting: Internal Medicine

## 2023-08-01 ENCOUNTER — Other Ambulatory Visit: Payer: Self-pay | Admitting: Psychiatry

## 2023-08-01 DIAGNOSIS — E1165 Type 2 diabetes mellitus with hyperglycemia: Secondary | ICD-10-CM

## 2023-08-01 DIAGNOSIS — L299 Pruritus, unspecified: Secondary | ICD-10-CM

## 2023-08-05 MED ORDER — BD PEN NEEDLE NANO 2ND GEN 32G X 4 MM MISC
4 refills | Status: DC
Start: 2023-08-05 — End: 2023-12-01

## 2023-08-06 ENCOUNTER — Encounter: Payer: Self-pay | Admitting: Internal Medicine

## 2023-08-14 ENCOUNTER — Other Ambulatory Visit: Payer: Self-pay

## 2023-08-14 ENCOUNTER — Telehealth: Payer: Self-pay | Admitting: Psychiatry

## 2023-08-14 DIAGNOSIS — F902 Attention-deficit hyperactivity disorder, combined type: Secondary | ICD-10-CM

## 2023-08-14 DIAGNOSIS — G4733 Obstructive sleep apnea (adult) (pediatric): Secondary | ICD-10-CM

## 2023-08-14 MED ORDER — MODAFINIL 200 MG PO TABS: ORAL_TABLET | ORAL | 0 refills | Status: DC

## 2023-08-14 NOTE — Telephone Encounter (Signed)
Pt LVM @ 2:24p requesting refill of Modafinil to [facility].  He said it must be a 90 script for them.  Next appt 11/13

## 2023-08-14 NOTE — Telephone Encounter (Signed)
Pended.

## 2023-08-17 DIAGNOSIS — L700 Acne vulgaris: Secondary | ICD-10-CM | POA: Diagnosis not present

## 2023-08-17 DIAGNOSIS — K130 Diseases of lips: Secondary | ICD-10-CM | POA: Diagnosis not present

## 2023-08-17 DIAGNOSIS — L853 Xerosis cutis: Secondary | ICD-10-CM | POA: Diagnosis not present

## 2023-08-27 ENCOUNTER — Ambulatory Visit (INDEPENDENT_AMBULATORY_CARE_PROVIDER_SITE_OTHER): Payer: 59 | Admitting: Mental Health

## 2023-08-27 DIAGNOSIS — F3110 Bipolar disorder, current episode manic without psychotic features, unspecified: Secondary | ICD-10-CM

## 2023-08-27 DIAGNOSIS — L700 Acne vulgaris: Secondary | ICD-10-CM | POA: Diagnosis not present

## 2023-08-27 NOTE — Progress Notes (Signed)
[facility]  Name: Cody Short Date:  08/27/23 MRN: [id number] DOB: 01-08-1976 PCP: Lewis Moccasin, MD  Time spent: 52 minutes Time in: 3: 00 PM time out: 3:52 PM  Treatment:   ind. Therapy   Mental Status Exam:    Appearance:    Casual     Behavior:   Appropriate  Motor:   WNL  Speech/Language:    Clear and Coherent  Affect:   Full range   Mood:   Euthymic, anxious  Thought process:   Logical, linear, goal directed  Thought content:     WNL  Sensory/Perceptual disturbances:     none  Orientation:   x4  Attention:   Good  Concentration:   Good  Memory:   Intact  Fund of knowledge:    Consistent with age and development  Insight:     Good  Judgment:    Good  Impulse Control:   Good     Reported Symptoms:  anxiety, rumination, intermittent depressed mood, impulsivity  Risk Assessment: Danger to Self:  No Self-injurious Behavior: No Danger to Others: No Duty to Warn:no Physical Aggression / Violence:No  Access to Firearms a concern: No  Gang Involvement:No  Patient / guardian was educated about steps to take if suicide or homicide risk level increases between visits: yes While future psychiatric events cannot be accurately predicted, the patient does not currently require acute inpatient psychiatric care and does not currently meet [facility] involuntary commitment criteria.  Subjective:  Patient arrived on time.  Assessed progress where he shared how he is worried about potentially having pancreatitis.  He went on to share how he had lab work where he will learn the results tomorrow.  He stated that he feels it may be due to medications but is unsure.  He went on to share work-related stress, an incident where a customer was rude to him and he subsequently called her a name to other employees, not to the customer.  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  Explored ways collaboratively to manage behaviors when emotionally distressed, angry.  Discussed some cognitive strategies, stop think act with patient to utilize, giving examples and engaging him in how he feels he could have utilized to handle the situation differently.  He plans to follow through and also journal daily as a way to vent as well as utilize his support system.     Interventions:  motivational interviewing, CBT    Diagnoses:    ICD-10-CM   1. Bipolar I disorder, most recent episode (or current) manic (HCC)  F31.10          Plan: Patient to utilize coping skills as discussed, continue his medication compliance to maintain mood stability. patient will continue to work on identifying and challenging his obsessive thoughts, particularly regarding relationships, and practicing grounding techniques to manage his anxiety. He will also be encouraged to celebrate his achievements at work and continue to take ownership of his successes.  Long-term goals:   Maintain symptom reduction: The patient will report sustained reduction in symptoms of anxiety using both CBT and mindfulness interventions for 3 consecutive months progressively.  Improve emotional regulation: The patient will learn and apply CBT and mindfulness-based strategies to regulate emotions, such as mindfulness-based stress reduction and cognitive restructuring, and report an improvement in emotional regulation for at least 3  consecutive months progressively.    Short-term goal:  The patient will learn and apply CBT and mindfulness-based coping skills for managing anxiety and practice using it between sessions.       2.   The patient will CBT and mindfulness-based interventions to increase awareness of negative thought patterns. 3.   The patient will keep and maintain employment to keep financial stress manageable       4.   The patient will decrease anxiety and stress when driving, when working  with customers and dealing with family issues.         Waldron Session, [facility]

## 2023-08-28 ENCOUNTER — Encounter: Payer: Self-pay | Admitting: Internal Medicine

## 2023-08-31 ENCOUNTER — Ambulatory Visit: Payer: 59 | Admitting: Internal Medicine

## 2023-08-31 ENCOUNTER — Encounter: Payer: Self-pay | Admitting: Internal Medicine

## 2023-08-31 VITALS — BP 120/70 | HR 78 | Ht 70.0 in | Wt 214.2 lb

## 2023-08-31 DIAGNOSIS — Z794 Long term (current) use of insulin: Secondary | ICD-10-CM | POA: Diagnosis not present

## 2023-08-31 DIAGNOSIS — Z7984 Long term (current) use of oral hypoglycemic drugs: Secondary | ICD-10-CM | POA: Diagnosis not present

## 2023-08-31 DIAGNOSIS — E66811 Obesity, class 1: Secondary | ICD-10-CM

## 2023-08-31 DIAGNOSIS — E782 Mixed hyperlipidemia: Secondary | ICD-10-CM | POA: Diagnosis not present

## 2023-08-31 DIAGNOSIS — E061 Subacute thyroiditis: Secondary | ICD-10-CM | POA: Diagnosis not present

## 2023-08-31 DIAGNOSIS — E1165 Type 2 diabetes mellitus with hyperglycemia: Secondary | ICD-10-CM

## 2023-08-31 LAB — HEMOGLOBIN A1C: Hemoglobin A1C: 7.2

## 2023-08-31 MED ORDER — EMPAGLIFLOZIN 25 MG PO TABS: 25.0000 mg | ORAL_TABLET | Freq: Every day | ORAL | 3 refills | Status: DC

## 2023-08-31 NOTE — Progress Notes (Unsigned)
Patient ID: Cody Short, male   DOB: November 28, 1975, 47 y.o.   MRN: [id number]  HPI: Cody Short is a 47 y.o.-year-old male, presenting for follow-up for DM2, dx in ~2010, insulin-dependent, uncontrolled, without long term complications. Last visit 4 months ago. He will change insurance to [facility] next year.  Interim history: He does have increased urination, very frequent throughout the day. He sees dermatology (Dr. Melida Quitter) and started Accutane for acne rash caused by Latuda.  His triglycerides increased significantly afterwards.  To improve these, he started to work on diet and exercise-per his scale at home he lost approximately 15 pounds in the last month. He mentions he now stopped the Accutane 2/2 AP + back pain.  DM2: Patient's diabetes is directly correlated with his bipolar disease treatment: In the past he had frequent urination resolved after stopping lithium.  While transitioning off lithium, his Saphris dose was increased the blood sugars started to increase afterwards.    He was then taken off Saphris and started on Loxitane.  This was making him sleepier, but was not increasing his blood sugars.  In fact, sugars started to improve and he was able to reduce his insulin doses.   She had to stop Loxitane 2/2 increased BP and pulse.  In summer 2021, he switched to Caplyta >> sugars improved significantly on this.  In fact, he tells me that he was able to come off insulin completely while on this.  However, this did not work for him -was having anger outbursts on it.   He started on Latuda >> feeling better on this. However, on this, he was eating more >> sugars increased.  Also, he gained weight. He continues on this.  He was on an appetite suppressant (Topamax). This was causing nausea >> stopped.   Reviewed HbA1c levels: Lab Results  Component Value Date   HGBA1C 8.2 04/27/2023   HGBA1C 7.3 (A) 11/24/2022   HGBA1C 7.4 (A) 07/15/2022   HGBA1C 7.4 (A)  03/06/2022   HGBA1C 9.1 (A) 10/10/2021   HGBA1C 7.4 (A) 05/14/2021   HGBA1C 7.3 (A) 01/18/2021   HGBA1C 6.9 (A) 09/14/2020   HGBA1C 8.6 (A) 05/10/2020   HGBA1C 7.9 (A) 02/14/2020   HGBA1C 7.7 (A) 06/14/2019   HGBA1C 6.8 (A) 11/29/2018   HGBA1C 8.1 (A) 04/02/2018   HGBA1C 7.4 12/03/2017   HGBA1C 7.1 09/01/2017   HGBA1C 7.1 09/01/2017   HGBA1C 7.5 06/01/2017   HGBA1C 8.9 02/10/2017   HGBA1C 8.7 11/11/2016   HGBA1C 7.9 06/27/2016  05/07/2021: HbA1c 9.3% 12/22/2018: HbA1c 7.2%  Pt is on a regimen of: - Metformin ER 2000 mg at dinnertime - Jardiance 10 >> 25 mg daily in am - Novolog before meals: 35-45 >> 5-20 >> 5-15 >> (15-)20 units 3x a day before meals >> now 5-15 units for a higher carb meal - Lantus 60-75 >> 60 >> Semglee/ Lantus 45 >> Levemir 15 >> 30-35 units at bedtime >> 10-15 units daily He could not tolerate Ozempic >> nausea, AP We had to stop Jardiance due to increased urination. He was previously on Lantus but stopped since sugars improved. He tried Glipizide >> hypoglycemia in the 40s repeatedly. Lowest: 25. Also, Glipizide 2.5 mg in am >> nausea, vomiting, constipation We tried Invokana >> bothersome urination (when he was working) >> had to stop. He had pancreatitis 2/2 Depakote and HTG in the past. He stopped Actos b/c stomach pain. >>  We stopped Cycloset >> inefficient, could not use b/c price.  Pt  checks his sugars 4 times a day per review of his log: - am: 105-156, 200, 242 >> 108-144, 157, 175 >> 122-218 >> 83-130, 152 - 2h after b'fast: 100-166, 244 >> 101 >> 162, 173, 325 >> n/c  - lunch: 175-260, 343 >> 67, 85-120 >> 85-143, 154 >> n/c  - 2h after lunch: 140-262, 310 >> 110-202, 290 >> 143, 144 >> n/c  - before dinner: 81-144, 218, 224 >> 94-153, 244 >> 96 >> 79-155, 186 - 2h after dinner: 76, 80, 160-201, 267 >> 89-174, 232 >> n/c >> 117, 171 - bedtime: 107-180 >> n/c >> 91-232 >> 115-252, 263, 281 >> 93-197 - nighttime: 89-181 >> see above >> 134 >>  83, 95 >> n/c Lowest sugar was 69... >> 80 >> 96 >> 76; he has hypoglycemia awareness in the 70s. Highest sugar was 354 ...>> 239 >> 340 >> 197.  Glucometer: FreeStyle Lite (he likes this)  No history of CKD, last BUN/creatinine:  09/10/2022: 20/1.3, GFR 69.5, glucose 127 Lab Results  Component Value Date   BUN 12 05/17/2021   CREATININE 1.10 05/17/2021   He had MAU in the past, improved at last check: Lab Results  Component Value Date   MICRALBCREAT 35.2 (H) 05/10/2020   MICRALBCREAT 26.7 04/02/2018   MICRALBCREAT 3.7 02/10/2017   MICRALBCREAT 27.3 12/17/2015   MICRALBCREAT 4.0 02/22/2015  12/22/2018: 262 On Diovan.  He has mixed hyperlipidemia: 07/20/2023:  09/10/2022: 173/199/40/93 06/23/2022: 188/347/41/78 Lab Results  Component Value Date   CHOL 225 (H) 02/10/2022   HDL 37 (L) 02/10/2022   LDLCALC 145 (H) 02/10/2022   LDLDIRECT 120.0 04/02/2018   TRIG 237 (H) 02/10/2022   CHOLHDL 4.6 08/06/2020  05/03/2020: 225/345/38/126 11/23/2019: 109/148/34/49 12/22/2018: 220/359/41/145 On fenofibrate 145 >> 160.   On Zetia.  He tried Repatha >> PA not approved.  Rosuvastatin caused leg cramps, reportedly elevated CK (900s).  Most recent CK was 562.  LFT's:   - last eye exam was in 2023: No DR. Decreased vision - depth.  -+ Numbness but no tingling in his feet. Last foot exam 04/27/2023.  He developed chest tightness 05/2021 and saw Dr. Jacinto Halim.  He had CAC score of 0 (07/11/2021). He has a history of scrotal rash-improved on Nystatin + Triamcinolone.  He may need refills in the future. He has recurrent groin furunculosis. 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  Subclinical thyrotoxicosis: TSH levels were reviewed: 06/23/2022: TSH 1.13 Lab Results  Component Value Date   TSH 1.11 10/10/2021   TSH <0.01 (L) 01/18/2021   TSH <0.01 (L) 12/11/2020   TSH 0.02 (L) 09/14/2020   TSH 4.03 12/17/2015   TSH 2.08 01/03/2014   TSH 5.15 11/20/2011   TSH 2.15 07/04/2010    TSH 3.82 12/12/2009   TSH 1.81 10/29/2007   FREET4 0.76 10/10/2021   FREET4 1.13 01/18/2021   FREET4 1.56 12/11/2020   FREET4 1.41 09/14/2020   T3FREE 3.7 10/10/2021   T3FREE 3.5 01/18/2021   T3FREE 4.1 12/11/2020   T3FREE 3.9 09/14/2020  05/07/2021: TSH 1.21 08/06/2020: TSH 0.079 05/03/2020: TSH 0.528 12/22/2018: 3.648  We checked a thyroid uptake and scan (01/02/2021) and this was consistent with thyroiditis: The thyroid scan is unremarkable. No hot or cold thyroid nodules are identified. 4 hour I-123 uptake = 2.7% (normal 5-20%) 24 hour I-123 uptake = 5.4% (normal 10-30%)   IMPRESSION: Low 4 hour and 24 hour or I 123 uptake may suggest thyroiditis.   Pt denies: - feeling nodules in neck -  hoarseness - dysphagia - choking  He had problems with muscle cramps/abdominal pain and lipase was found to be elevated again, at 67 (11-51) in 04/2021.  At that time, he also had transaminitis and an increase CK at 562 (49-347).    ROS:+ see HPI  I reviewed pt's medications, allergies, PMH, social hx, family hx, and changes were documented in the history of present illness. Otherwise, unchanged from my initial visit note.  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 Recurs if cannot walk for a week.      TOBACCO ABUSE, HX OF 07/31/2009   Past Surgical History:  Procedure Laterality Date   pilar cystectomy     History   Social History   Marital Status: Single    Spouse Name: N/A   Number of Children: 0   Occupational History      Social History Main Topics   Smoking status: Former Smoker - quit in 2010   Smokeless tobacco: Never Used   Alcohol Use: No     Comment:  none at all   Drug Use: No   Current Outpatient Medications  Medication Sig Dispense Refill   Accu-Chek Softclix Lancets lancets Use as instructed to check blood sugar 4 times daily. DX:E11.65 300 each 3   Adapalene-Benzoyl Peroxide 0.1-2.5 % gel SMARTSIG:1 Application Topical Every Evening     Blood Glucose Monitoring Suppl (CONTOUR NEXT MONITOR) w/Device KIT Check sugar 4 times daily 1 kit 0   Blood Glucose Monitoring Suppl (FREESTYLE LITE) DEVI Use as instructed to check sugar 4 times daily 1 each 0   Docusate Sodium (COLACE PO) Take 2 tablets by mouth daily.     ezetimibe (ZETIA) 10 MG tablet TAKE 1 TABLET (10 MG TOTAL) BY MOUTH DAILY AFTER SUPPER. 90 tablet 0   famotidine (PEPCID) 40 MG tablet Take 40 mg by mouth 2 (two) times daily.     fenofibrate (TRICOR) 145 MG tablet TAKE 1 TABLET BY MOUTH EVERY DAY 90 tablet 0   glucose blood (ACCU-CHEK GUIDE) test strip USE AS INSTRUCTED TO CHECK BLOOD SUGAR 4 TIMES A DAY 300 strip 3   hydrOXYzine (ATARAX) 10 MG tablet TAKE 1 TABLET 3 TIMES A DAYAS NEEDED 270 tablet 0   insulin aspart (NOVOLOG FLEXPEN) 100 UNIT/ML FlexPen Inject 5-20 Units into the skin in the morning, at noon, in the evening, and at bedtime. 30 mL 3   Insulin Pen Needle (BD PEN NEEDLE NANO 2ND GEN) 32G X 4 MM MISC USE 4 TIMES A DAY WITH     INSULIN PENS 400 each 4   JARDIANCE 25 MG TABS tablet TAKE 1 TABLET DAILY BEFORE BREAKFAST 90 tablet 0   LEVEMIR FLEXPEN 100 UNIT/ML FlexPen INJECT 30 UNITS            SUBCUTANEOUSLY DAILY 30 mL 2   Lurasidone HCl 120 MG TABS TAKE 1 TABLET DAILY WITH   BREAKFAST (DOSE CHANGE) 90 tablet 1   metFORMIN (GLUCOPHAGE-XR) 500 MG 24 hr tablet TAKE 2 TABLETS (1,000MG     TOTAL) 2 TIMES DAILY WITH  MEALS 360 tablet 0   modafinil (PROVIGIL) 200 MG tablet Take 1 tablet  each AM 90 tablet 0   valsartan (DIOVAN) 160 MG tablet Take 160 mg by mouth daily.     No current facility-administered medications for this visit.    No current facility-administered  medications on file prior to visit.    Allergies  Allergen Reactions   Divalproex Sodium Other (See Comments)    unknown   Methylphenidate Hcl     Aggravate bipolar   Oxycodone Hcl     REACTION: hallucinations   Ozempic (0.25 Or 0.5 Mg-Dose) [Semaglutide(0.25 Or 0.5mg -Dos)] Nausea Only   Paroxetine     Aggravate bipolar disorder   Rosuvastatin Other (See Comments)    Myopathy -muscle cramps, elevated CK   Family History  Problem Relation Age of Onset   Diabetes Mother    Breast cancer Mother        was told not hereditary   Cirrhosis Mother        fatty liver   High blood pressure Mother    High blood pressure Father    Diabetes Father    Melanoma Maternal Uncle    Colon cancer Neg Hx    Stomach cancer Neg Hx    Pancreatitis Neg Hx    Heart disease Neg Hx    Kidney disease Neg Hx    Liver disease Neg Hx    PE: BP 120/70   Pulse 78   Ht 5\' 10"  (1.778 m)   Wt 214 lb 3.2 oz (97.2 kg)   SpO2 97%   BMI 30.73 kg/m   Wt Readings from Last 3 Encounters:  08/31/23 214 lb 3.2 oz (97.2 kg)  04/27/23 222 lb (100.7 kg)  12/08/22 218 lb (98.9 kg)   Constitutional: overweight, in NAD Eyes: EOMI, no exophthalmos ENT:no thyromegaly, no cervical lymphadenopathy Cardiovascular: RRR, No MRG Respiratory: CTA B Musculoskeletal: no deformities Skin:no rashes Neurological: no tremor with outstretched hands  ASSESSMENT: 1. DM2, insulin-dependent, uncontrolled, without long term complications, but with hyperglycemia  His test were negative for type 1 diabetes: Component     Latest Ref Rng 05/28/2015  C-Peptide     0.80 - 3.90 ng/mL 2.88  Glucose, Fasting     65 - 99 mg/dL 161 (H)  Glutamic Acid Decarb Ab     <5 IU/mL <5  Pancreatic Islet Cell Antibody     < 5 JDF Units <5   - His diabetes is difficult to manage because of multiple intolerances: - We tried to add Invokana but he did not tolerated due to increased urination.  - We cannot use a DPP 4 inhibitor or a GLP-1  receptor agonist due to his history of pancreatitis.  - He had to stop Actos >>  abd. Pain - we stopped Cycloset >> expensive and not effective - we tried Glipizide >> GI sxs: N/V/D - he tried the Dexcom G6 CGM but he felt that this was giving him a lot of errors and stopped  2. HTG  3.  Obesity class I  4.  Subacute thyroiditis  PLAN:  1. Patient with uncontrolled type 2 diabetes, on oral antidiabetic regimen with metformin and SGLT2 inhibitor and also basal large bolus insulin regimen, with improving control after starting Jardiance.  Of note, he is not a good candidate for GLP-1 receptor agonist due to history of pancreatitis.  He did try Ozempic with abdominal pain and nausea, so currently off.  At last visit, HbA1c was higher, at 8.2%.  We discussed about reducing snacking and portions. -Since last visit, he was found  to have high triglycerides again and also developed abdominal pain with Accutane.  He now stopped the Accutane and also improved with diet and exercise and he was able to lose weight (8 pounds by our scale) and sugars improved significantly.  He is not using much lower doses of insulin, with the NovoLog only used before higher carb meals and the Levemir approximately 50% of the original dose.  I advised him to continue with the same regimen for now, but after the holidays to try to reduce the insulin even more, and possibly come off. -He is concerned about increased urination with Jardiance.  We discussed that if this continues to be bothersome, he can reduce the dose to half, 12.5 mg daily.  If he does better with this dose, we can switch him to 10 mg daily afterwards. - I advised him to: Patient Instructions  Please continue: - Metformin ER 2000 mg at dinnertime - Jardiance 25 mg daily in am (if you continue to have increased urination, reduce this to 12.5 mg daily) - Novolog before meals: 5-15 units only before higher carb meals - Levemir 10 units at bedtime  Please  return in 3-4 months with your sugar log.   - we checked his HbA1c: 7.2% (lower) - advised to check sugars at different times of the day - 4x a day, rotating check times - advised for yearly eye exams >> he is due - return to clinic in 3-4 months  2. HL -With predominant hypertriglyceridemia -Reviewed latest lipid panel from 06/15/2022 and 09/15/2022: Triglycerides only slightly elevated, at 199 on the last lipid panel and LDL <100, at 93 -Most recent lipid panel, however, shows very high triglycerides and LDL slightly above target  -He continues on fenofibrate 160 mg daily and Zetia 10 mg daily.  I suspect that his triglycerides will improve now with diet and exercise -He had muscle cramps and elevated CK previously but this normalized.  Also, cramps have resolved.  3.  Obesity class I -He gained a significant amount of weight on Latuda.  He needs to get a snack with 350 calories at bedtime when he takes Jordan. -We cannot use Ozempic due to history of pancreatitis and GI symptoms when he actually tried it -He continues on Jardiance which should also help with weight loss - we may need to decrease the dose to half but I am hoping that we do not need to stop it -He continues to use a CPAP for OSA.  He feels that this and modafinil made a great difference in his energy levels. -He lost 8 pounds since last visit  4.  Subacute thyroiditis -Diagnosed by uptake and scan -Latest TSH was normal -No signs of symptoms of thyrotoxicosis other than the significant weight loss mentioned above -Will repeat his TFTs now  Carlus Pavlov, MD PhD [facility]

## 2023-08-31 NOTE — Patient Instructions (Addendum)
Please continue: - Metformin ER 2000 mg at dinnertime - Jardiance 25 mg daily in am (if you continue to have increased urination, reduce this to 12.5 mg daily) - Novolog before meals: 5-15 units only before higher carb meals - Levemir 10 units at bedtime  Please return in 3-4 months with your sugar log.

## 2023-09-01 LAB — MICROALBUMIN / CREATININE URINE RATIO
Creatinine,U: 92 mg/dL
Microalb Creat Ratio: 3.5 mg/g (ref 0.0–30.0)
Microalb, Ur: 3.2 mg/dL — ABNORMAL HIGH (ref 0.0–1.9)

## 2023-09-01 LAB — T3, FREE: T3, Free: 3.7 pg/mL (ref 2.3–4.2)

## 2023-09-01 LAB — T4, FREE: Free T4: 0.83 ng/dL (ref 0.60–1.60)

## 2023-09-01 LAB — TSH: TSH: 1.46 u[IU]/mL (ref 0.35–5.50)

## 2023-09-02 ENCOUNTER — Encounter: Payer: Self-pay | Admitting: Neurology

## 2023-09-03 DIAGNOSIS — K130 Diseases of lips: Secondary | ICD-10-CM | POA: Diagnosis not present

## 2023-09-03 DIAGNOSIS — L853 Xerosis cutis: Secondary | ICD-10-CM | POA: Diagnosis not present

## 2023-09-03 DIAGNOSIS — L700 Acne vulgaris: Secondary | ICD-10-CM | POA: Diagnosis not present

## 2023-09-04 ENCOUNTER — Other Ambulatory Visit: Payer: Self-pay

## 2023-09-04 ENCOUNTER — Encounter: Payer: Self-pay | Admitting: Internal Medicine

## 2023-09-04 ENCOUNTER — Encounter (HOSPITAL_BASED_OUTPATIENT_CLINIC_OR_DEPARTMENT_OTHER): Payer: Self-pay

## 2023-09-04 ENCOUNTER — Emergency Department (HOSPITAL_BASED_OUTPATIENT_CLINIC_OR_DEPARTMENT_OTHER)
Admission: EM | Admit: 2023-09-04 | Discharge: 2023-09-04 | Disposition: A | Discharge status: Home / Self Care | Payer: 59 | Attending: Emergency Medicine | Admitting: Emergency Medicine

## 2023-09-04 ENCOUNTER — Emergency Department (HOSPITAL_BASED_OUTPATIENT_CLINIC_OR_DEPARTMENT_OTHER): Payer: 59

## 2023-09-04 DIAGNOSIS — R911 Solitary pulmonary nodule: Secondary | ICD-10-CM | POA: Diagnosis not present

## 2023-09-04 DIAGNOSIS — R109 Unspecified abdominal pain: Secondary | ICD-10-CM

## 2023-09-04 DIAGNOSIS — R748 Abnormal levels of other serum enzymes: Secondary | ICD-10-CM | POA: Diagnosis not present

## 2023-09-04 DIAGNOSIS — Z79899 Other long term (current) drug therapy: Secondary | ICD-10-CM | POA: Insufficient documentation

## 2023-09-04 DIAGNOSIS — Z794 Long term (current) use of insulin: Secondary | ICD-10-CM | POA: Diagnosis not present

## 2023-09-04 DIAGNOSIS — E119 Type 2 diabetes mellitus without complications: Secondary | ICD-10-CM | POA: Diagnosis not present

## 2023-09-04 DIAGNOSIS — Z7984 Long term (current) use of oral hypoglycemic drugs: Secondary | ICD-10-CM | POA: Diagnosis not present

## 2023-09-04 DIAGNOSIS — I10 Essential (primary) hypertension: Secondary | ICD-10-CM | POA: Diagnosis not present

## 2023-09-04 DIAGNOSIS — N281 Cyst of kidney, acquired: Secondary | ICD-10-CM | POA: Insufficient documentation

## 2023-09-04 DIAGNOSIS — R1030 Lower abdominal pain, unspecified: Secondary | ICD-10-CM | POA: Diagnosis not present

## 2023-09-04 DIAGNOSIS — R1032 Left lower quadrant pain: Secondary | ICD-10-CM | POA: Diagnosis not present

## 2023-09-04 LAB — COMPREHENSIVE METABOLIC PANEL
ALT: 23 U/L (ref 0–44)
AST: 25 U/L (ref 15–41)
Albumin: 4.8 g/dL (ref 3.5–5.0)
Alkaline Phosphatase: 46 U/L (ref 38–126)
Anion gap: 12 (ref 5–15)
BUN: 17 mg/dL (ref 6–20)
CO2: 20 mmol/L — ABNORMAL LOW (ref 22–32)
Calcium: 10.3 mg/dL (ref 8.9–10.3)
Chloride: 106 mmol/L (ref 98–111)
Creatinine, Ser: 1.3 mg/dL — ABNORMAL HIGH (ref 0.61–1.24)
GFR, Estimated: >60 mL/min (ref >60)
Glucose, Bld: 131 mg/dL — ABNORMAL HIGH (ref 70–99)
Potassium: 3.9 mmol/L (ref 3.5–5.1)
Sodium: 138 mmol/L (ref 135–145)
Total Bilirubin: 0.5 mg/dL (ref <1.2)
Total Protein: 7.9 g/dL (ref 6.5–8.1)

## 2023-09-04 LAB — CBC WITH DIFFERENTIAL/PLATELET
Abs Immature Granulocytes: 0.01 10*3/uL (ref 0.00–0.07)
Basophils Absolute: 0 10*3/uL (ref 0.0–0.1)
Basophils Relative: 1 %
Eosinophils Absolute: 0.1 10*3/uL (ref 0.0–0.5)
Eosinophils Relative: 1 %
HCT: 40.6 % (ref 39.0–52.0)
Hemoglobin: 13.5 g/dL (ref 13.0–17.0)
Immature Granulocytes: 0 %
Lymphocytes Relative: 28 %
Lymphs Abs: 1.7 10*3/uL (ref 0.7–4.0)
MCH: 28.7 pg (ref 26.0–34.0)
MCHC: 33.3 g/dL (ref 30.0–36.0)
MCV: 86.4 fL (ref 80.0–100.0)
Monocytes Absolute: 0.4 10*3/uL (ref 0.1–1.0)
Monocytes Relative: 7 %
Neutro Abs: 3.7 10*3/uL (ref 1.7–7.7)
Neutrophils Relative %: 63 %
Platelets: 237 10*3/uL (ref 150–400)
RBC: 4.7 MIL/uL (ref 4.22–5.81)
RDW: 13.5 % (ref 11.5–15.5)
WBC: 5.9 10*3/uL (ref 4.0–10.5)
nRBC: 0 % (ref 0.0–0.2)

## 2023-09-04 LAB — URINALYSIS, ROUTINE W REFLEX MICROSCOPIC
Bacteria, UA: NONE SEEN
Bilirubin Urine: NEGATIVE
Glucose, UA: >1000 mg/dL — AB
Hgb urine dipstick: NEGATIVE
Ketones, ur: NEGATIVE mg/dL
Leukocytes,Ua: NEGATIVE
Nitrite: NEGATIVE
Protein, ur: NEGATIVE mg/dL
Specific Gravity, Urine: 1.025 (ref 1.005–1.030)
pH: 5.5 (ref 5.0–8.0)

## 2023-09-04 LAB — LIPASE, BLOOD: Lipase: 115 U/L — ABNORMAL HIGH (ref 11–51)

## 2023-09-04 MED ORDER — DICYCLOMINE HCL 20 MG PO TABS: 20.0000 mg | ORAL_TABLET | Freq: Two times a day (BID) | ORAL | 0 refills | Status: DC | PRN

## 2023-09-04 MED ORDER — ONDANSETRON 4 MG PO TBDP: 4.0000 mg | ORAL_TABLET | Freq: Three times a day (TID) | ORAL | 0 refills | Status: DC | PRN

## 2023-09-04 MED ORDER — IOHEXOL 300 MG/ML  SOLN
100.0000 mL | Freq: Once | INTRAMUSCULAR | Status: AC | PRN
  Administered 2023-09-04: 100 mL via INTRAVENOUS

## 2023-09-04 NOTE — ED Triage Notes (Signed)
Pt c/o abd pain x3wks, lower back pain associated. Hx of pancreatitis, states "at first I thought this was another round, but not anymore."  Denies NVD, states he's had multiple medication changes recently

## 2023-09-04 NOTE — ED Notes (Addendum)
error 

## 2023-09-04 NOTE — Discharge Instructions (Addendum)
As discussed, workup today overall reassuring.  CT imaging did not show anything obvious as cause of your pain.  It did show evidence of pulmonary nodule as well as left-sided renal cyst which are not causing pain.  Your lipase was elevated so you could be having early pancreatitis.  Will send you home with nausea medicine to use as needed as well as medication to take as needed for abdominal discomfort.  Please do not hesitate to return if the worrisome signs and symptoms we discussed become apparent.

## 2023-09-04 NOTE — ED Provider Notes (Signed)
[place] EMERGENCY DEPARTMENT AT [facility] Provider Note   CSN: [id number] Arrival date & time: 09/04/23  1146     History  No chief complaint on file.   Cody Short is a 47 y.o. male.  HPI   47 year old male presents to the emergency department with complaints of lower abdominal pain as well as left-sided low back pain.  Patient states that he has been having symptoms this past 3 weeks.  Describes pain as intermittent.  States that when it occurs it is sharp and stabbing.  Currently without pain.  States that pain is worsened with consumption of food as well as with certain movements.  Denies any associated fever, nausea, vomiting, urinary symptoms, change in bowel habits.  Denies history of similar symptoms in the past.  Last bowel movement earlier today and normal per patient.  Past medical history significant for hypertension, hypercholesterolemia, triglyceridemia, pancreatitis, diabetes mellitus type 2, obesity  Home Medications Prior to Admission medications   Medication Sig Start Date End Date Taking? Authorizing Provider  dicyclomine (BENTYL) 20 MG tablet Take 1 tablet (20 mg total) by mouth 2 (two) times daily as needed for spasms. 09/04/23  Yes Sherian Maroon A, PA  ondansetron (ZOFRAN-ODT) 4 MG disintegrating tablet Take 1 tablet (4 mg total) by mouth every 8 (eight) hours as needed. 09/04/23  Yes Sherian Maroon A, PA  Accu-Chek Softclix Lancets lancets Use as instructed to check blood sugar 4 times daily. DX:E11.65 07/02/22   Carlus Pavlov, MD  Adapalene-Benzoyl Peroxide 0.1-2.5 % gel SMARTSIG:1 Application Topical Every Evening 02/05/22   [provider]  Blood Glucose Monitoring Suppl (CONTOUR NEXT MONITOR) w/Device KIT Check sugar 4 times daily 03/26/22   Carlus Pavlov, MD  Blood Glucose Monitoring Suppl (FREESTYLE LITE) DEVI Use as instructed to check sugar 4 times daily 01/17/21   Carlus Pavlov, MD  Docusate Sodium (COLACE PO)  Take 2 tablets by mouth daily.    [provider]  empagliflozin (JARDIANCE) 25 MG TABS tablet Take 1 tablet (25 mg total) by mouth daily before breakfast. 08/31/23   Carlus Pavlov, MD  ezetimibe (ZETIA) 10 MG tablet TAKE 1 TABLET (10 MG TOTAL) BY MOUTH DAILY AFTER SUPPER. 05/05/22 08/03/22  Yates Decamp, MD  famotidine (PEPCID) 40 MG tablet Take 40 mg by mouth 2 (two) times daily. Patient not taking: Reported on 08/31/2023 02/13/22   [provider]  fenofibrate (TRICOR) 145 MG tablet TAKE 1 TABLET BY MOUTH EVERY DAY 05/05/22   Yates Decamp, MD  glucose blood (ACCU-CHEK GUIDE) test strip USE AS INSTRUCTED TO CHECK BLOOD SUGAR 4 TIMES A DAY 05/26/23   Carlus Pavlov, MD  hydrOXYzine (ATARAX) 10 MG tablet TAKE 1 TABLET 3 TIMES A DAYAS NEEDED 08/02/23   Cottle, Steva Ready., MD  insulin aspart (NOVOLOG FLEXPEN) 100 UNIT/ML FlexPen Inject 5-20 Units into the skin in the morning, at noon, in the evening, and at bedtime. 02/10/23   Carlus Pavlov, MD  Insulin Pen Needle (BD PEN NEEDLE NANO 2ND GEN) 32G X 4 MM MISC USE 4 TIMES A DAY WITH     INSULIN PENS 08/05/23   Carlus Pavlov, MD  LEVEMIR FLEXPEN 100 UNIT/ML FlexPen INJECT 30 UNITS            SUBCUTANEOUSLY DAILY 05/28/23   Carlus Pavlov, MD  Lurasidone HCl 120 MG TABS TAKE 1 TABLET DAILY WITH   BREAKFAST (DOSE CHANGE) 02/04/23   Cottle, Steva Ready., MD  metFORMIN (GLUCOPHAGE-XR) 500 MG 24 hr tablet  TAKE 2 TABLETS (1,000MG     TOTAL) 2 TIMES DAILY WITH  MEALS 04/05/23   Carlus Pavlov, MD  modafinil (PROVIGIL) 200 MG tablet Take 1 tablet each AM 08/14/23   Cottle, Steva Ready., MD  valsartan (DIOVAN) 160 MG tablet Take 160 mg by mouth daily.    [provider]      Allergies    Divalproex sodium, Methylphenidate hcl, Oxycodone hcl, Ozempic (0.25 or 0.5 mg-dose) [semaglutide(0.25 or 0.5mg -dos)], Paroxetine, and Rosuvastatin    Review of Systems   Review of Systems  All other systems reviewed and are negative.   Physical  Exam Updated Vital Signs BP 128/87   Pulse 69   Temp 98 F (36.7 C)   Resp 16   SpO2 100%  Physical Exam Vitals and nursing note reviewed.  Constitutional:      General: He is not in acute distress.    Appearance: He is well-developed.  HENT:     Head: Normocephalic and atraumatic.  Eyes:     Conjunctiva/sclera: Conjunctivae normal.  Cardiovascular:     Rate and Rhythm: Normal rate and regular rhythm.     Heart sounds: No murmur heard. Pulmonary:     Effort: Pulmonary effort is normal. No respiratory distress.     Breath sounds: Normal breath sounds.  Abdominal:     Palpations: Abdomen is soft.     Tenderness: There is abdominal tenderness in the suprapubic area and left lower quadrant.  Musculoskeletal:        General: No swelling.     Cervical back: Neck supple.  Skin:    General: Skin is warm and dry.     Capillary Refill: Capillary refill takes less than 2 seconds.  Neurological:     Mental Status: He is alert.  Psychiatric:        Mood and Affect: Mood normal.     ED Results / Procedures / Treatments   Labs (all labs ordered are listed, but only abnormal results are displayed) Labs Reviewed  COMPREHENSIVE METABOLIC PANEL - Abnormal; Notable for the following components:      Result Value   CO2 20 (*)    Glucose, Bld 131 (*)    Creatinine, Ser 1.30 (*)    All other components within normal limits  LIPASE, BLOOD - Abnormal; Notable for the following components:   Lipase 115 (*)    All other components within normal limits  URINALYSIS, ROUTINE W REFLEX MICROSCOPIC - Abnormal; Notable for the following components:   Glucose, UA >1,000 (*)    All other components within normal limits  CBC WITH DIFFERENTIAL/PLATELET    EKG None  Radiology CT ABDOMEN PELVIS W CONTRAST  Result Date: 09/04/2023 CLINICAL DATA:  Abdominal pain. Acute abdominal pain for 3 weeks. EXAM: CT ABDOMEN AND PELVIS WITH CONTRAST TECHNIQUE: Multidetector CT imaging of the abdomen and  pelvis was performed using the standard protocol following bolus administration of intravenous contrast. RADIATION DOSE REDUCTION: This exam was performed according to the departmental dose-optimization program which includes automated exposure control, adjustment of the mA and/or kV according to patient size and/or use of iterative reconstruction technique. CONTRAST:  OMNIPAQUE IOHEXOL 300 MG/ML  SOLN COMPARISON:  None Available. FINDINGS: Lower chest: 4 mm pulmonary nodule in the LEFT lower lobe (image number 4/series 4. Hepatobiliary: No focal hepatic lesion. Normal gallbladder. No biliary duct dilatation. Common bile duct is normal. Pancreas: Pancreas is normal. No ductal dilatation. No pancreatic inflammation. Spleen: Normal spleen Adrenals/urinary tract: Adrenal glands  normal. 18 mm simple fluid attenuation cyst in the LEFT kidney. Adjacent 10 mm cyst is also simple fluid attenuation on postcontrast imaging. Ureters and bladder normal. Stomach/Bowel: Stomach, small bowel, appendix, and cecum are normal. The colon and rectosigmoid colon are normal. Vascular/Lymphatic: Abdominal aorta is normal caliber. No periportal or retroperitoneal adenopathy. No pelvic adenopathy. Reproductive: Unremarkable Other: No free fluid. Musculoskeletal: No aggressive osseous lesion. IMPRESSION: 1. No acute findings in the abdomen pelvis. 2. Normal appendix. 3. Simple cysts LEFT kidney. No follow-up recommended for benign renal lesion. 4. Solid pulmonary nodule measuring 4 mm. Per Fleischner Society Guidelines, no routine follow-up imaging is recommended. These guidelines do not apply to immunocompromised patients and patients with cancer. Follow up in patients with significant comorbidities as clinically warranted. For lung cancer screening, adhere to Lung-RADS guidelines. Reference: Radiology. 2017; 284(1):228-43. Electronically Signed   By: Genevive Bi M.D.   On: 09/04/2023 16:24    Procedures Procedures     Medications Ordered in ED Medications  iohexol (OMNIPAQUE) 300 MG/ML solution 100 mL (100 mLs Intravenous Contrast Given 09/04/23 1339)    ED Course/ Medical Decision Making/ A&P                                 Medical Decision Making Amount and/or Complexity of Data Reviewed Labs: ordered. Radiology: ordered.  Risk Prescription drug management.   This patient presents to the ED for concern of abdominal pain, this involves an extensive number of treatment options, and is a complaint that carries with it a high risk of complications and morbidity.  The differential diagnosis includes pyelonephritis, nephrolithiasis, cystitis, gastritis, pancreatitis, CBD pathology, cholecystitis, SBO/LBO, volvulus, diverticulitis, appendicitis, other   Co morbidities that complicate the patient evaluation  See HPI   Additional history obtained:  Additional history obtained from EMR External records from outside source obtained and reviewed including hospital records   Lab Tests:  I Ordered, and personally interpreted labs.  The pertinent results include: No leukocytosis.  No evidence of anemia.  Placed within range.  UA with glucose present but otherwise without abnormality.  Mild decrease in bicarb of 20 but otherwise, S within normal limits.  Elevation of creatinine 1.3 from baseline around 1.1-1.2.  No transaminitis.  Lipase elevated at 115.   Imaging Studies ordered:  I ordered imaging studies including CT abdomen pelvis I independently visualized and interpreted imaging which showed no acute abnormality.  Left renal cyst.  Pulmonary nodule I agree with the radiologist interpretation   Cardiac Monitoring: / EKG:  The patient was maintained on a cardiac monitor.  I personally viewed and interpreted the cardiac monitored which showed an underlying rhythm of: Sinus rhythm   Consultations Obtained:  N/a   Problem List / ED Course / Critical interventions / Medication  management  Abdominal pain, elevated lipase Reevaluation of the patient showed that the patient stayed the same I have reviewed the patients home medicines and have made adjustments as needed   Social Determinants of Health:  Former cigarette use.  Denies illicit drug use.   Test / Admission - Considered:  Abdominal pain, elevated lipase Vitals signs within normal range and stable throughout visit. Laboratory/imaging studies significant for: See above 47 year old male presents emergency department with complaints of lower abdominal pain.  Symptoms been present for the past 3 weeks or so with intermittent intensity.  On exam, patient with tenderness in suprapubic region as well as left lower quadrant  of the abdomen.  Labs grossly unremarkable for any acute process but with elevated lipase of 115.  CT imaging showed no acute abnormality in patient's abdomen/pelvis.  Unsure of exact etiology of patient's abdominal pain.  Patient technically not meeting criteria for pancreatitis given lack of epigastric tenderness on exam, 3 times upper limit of lipase with CT imaging without any concerning feature involving pancreas.  Will trial Bentyl in the outpatient setting as well as antiemetic as needed and follow-up with PCP/GI in the outpatient setting for reassessment.  Treatment plan discussed at length with the patient and he acknowledged understanding was agreeable to said plan.  Patient overall well-appearing, afebrile in no acute distress, tolerating p.o. without difficulty. Worrisome signs and symptoms were discussed with the patient, and the patient acknowledged understanding to return to the ED if noticed. Patient was stable upon discharge.          Final Clinical Impression(s) / ED Diagnoses Final diagnoses:  Abdominal pain, unspecified abdominal location  Elevated lipase  Renal cyst  Pulmonary nodule    Rx / DC Orders ED Discharge Orders          Ordered    dicyclomine (BENTYL)  20 MG tablet  2 times daily PRN        09/04/23 1633    ondansetron (ZOFRAN-ODT) 4 MG disintegrating tablet  Every 8 hours PRN        09/04/23 1633              Peter Garter, [place] 09/04/23 1726    Maia Plan, MD 09/05/23 [phone]

## 2023-09-04 NOTE — ED Notes (Signed)
Pt given peanut butter crackers, popcorn, and a diet coke.

## 2023-09-06 ENCOUNTER — Encounter: Payer: Self-pay | Admitting: Neurology

## 2023-09-07 DIAGNOSIS — E785 Hyperlipidemia, unspecified: Secondary | ICD-10-CM | POA: Diagnosis not present

## 2023-09-07 DIAGNOSIS — R109 Unspecified abdominal pain: Secondary | ICD-10-CM | POA: Diagnosis not present

## 2023-09-09 ENCOUNTER — Ambulatory Visit (INDEPENDENT_AMBULATORY_CARE_PROVIDER_SITE_OTHER): Payer: 59 | Admitting: Psychiatry

## 2023-09-09 DIAGNOSIS — F5105 Insomnia due to other mental disorder: Secondary | ICD-10-CM | POA: Diagnosis not present

## 2023-09-09 DIAGNOSIS — F1211 Cannabis abuse, in remission: Secondary | ICD-10-CM | POA: Diagnosis not present

## 2023-09-09 DIAGNOSIS — G4733 Obstructive sleep apnea (adult) (pediatric): Secondary | ICD-10-CM | POA: Diagnosis not present

## 2023-09-09 DIAGNOSIS — F5113 Hypersomnia due to other mental disorder: Secondary | ICD-10-CM

## 2023-09-09 DIAGNOSIS — F3110 Bipolar disorder, current episode manic without psychotic features, unspecified: Secondary | ICD-10-CM | POA: Diagnosis not present

## 2023-09-09 DIAGNOSIS — F411 Generalized anxiety disorder: Secondary | ICD-10-CM

## 2023-09-09 DIAGNOSIS — F902 Attention-deficit hyperactivity disorder, combined type: Secondary | ICD-10-CM | POA: Diagnosis not present

## 2023-09-09 DIAGNOSIS — F4001 Agoraphobia with panic disorder: Secondary | ICD-10-CM | POA: Diagnosis not present

## 2023-09-09 NOTE — Progress Notes (Signed)
Cody Short [id number] 03-Jun-1976 47 y.o.    Subjective:   Patient ID:  Cody Short is a 47 y.o. (DOB 17-Jun-1976) male.  Chief Complaint:  Chief Complaint  Patient presents with   Follow-up    Mood and meds   Anxiety     Cody Short presents to the office today for follow-up of several psychiatric diagnoses ...    seen August 2020.  For obsessive anxiety  Switched Lexapro 5 mg daily on May 18.  But he stated it made him hungry and so it was stopped in August.  He continued on Saphris 10 mg nightly, lithium 1200 mg daily, propranolol as needed tremor.  Lithium level was ordered.  He called Sept saying he was manic.  Had a lithium level 0.7 and so dose was increased to 1500 mg daily.    seen December 21, 2019.  He had some ongoing manic symptoms and Saphris was increased to 15 mg nightly.  02/07/2020 appointment with the following noted: He has made multiple phone calls to the office since that date.  He decided he wanted to stop lithium because of urinary problems.  He was warned this could lead to mania but he wanted to pursue it anyway.  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  Cody Short had been having bedwetting episodes which she attributed to the lithium.  He has not consistently taken Saphris 15 mg at bedtime since the office visit in February. He completely stopped lithium January 30, 2020 U frequency is much better and less thirsty.  Bedwetting resolved.  Reduced BM to 1-2 times daily.  Gassy.  Glucose is high.  B is hospitalized with unknown disease.  Insulin adjusted. Mental status is best it's ever been.  Less mania.  Less obsessing.  No worsening mania. Is taking Saphris 15 mg HS> Plan no med changes.  04/10/2020 appointment with the following noted: No longer on lithium.  Still frequent urination and thirst DT DM poorly controlled despite meds.  Endo says little else to take bc history of  pancreatitis.  Says he goes to urinate q 15 mins. Plan: Wean Saphris in crossover to loxapine 150 mg HS over 2 weeks bc he and endocrinologist believe it's likely Saphris is cause of the inability to get his glucose to acceptable levels.   06/07/20 appt with the following noted: He's called a couple of times CO sleepiness and reduced loxapine to 50 mg HS. Started Vegan diet and BS is better 4 weeks ago.  Thinks glucose is better off the Saphris. Lost 6 #.   CO sleepiness and sleep 16 hours   He thinks it also drove up pulse and blood pressure. Asks about taking Saphris 10 and a second medication. Mood has been fine.   Plan: He wants to restart Saphris 10 mg HS and use a 2nd mood stabilizer. Start Saphris and reduce Loxapine  To 10 mg at night DT prior failure of 10 Mg Saphris with lithium.  06/27/2020 phone call from patient: Pt called to report Loxapine & Saphris is causing his BP to be very high and Saphris is causing blood sugar to be high. Apt 10/18. Need changes before then. Call back @ [phone]  MD response: He had wanted to go back on Saphris and reduce loxapine which we did.  Tell him to stop the Saphris and increase loxapine to 20 mg nightly.  He did not tolerate 50 mg of loxapine very  well. Nurse TC withpt: Spoke with Cody Short and he will stop the Saphris, but feels the increase in the Loxapine to 20 mg will increase his blood sugars more. Explained that with coming off the Saphris he needs to increase it a bit or he will not be stable with his moods. Advised him to stop the Saphris and would check with Dr. Jennelle Human about any other recommendations.   07/17/20 appt with the following noted: Doing terrible.  Needs melatonin to sleep.  Claims loxapine is elevating BP, pulse, TG, cholesterol, glucose.  Claimed Saphris did it too.   Sees PCP at end of October.   Started Vegan diet about 6 weeks ago and exercising.   A/P: There are no available mood stabilizers that do not have a warning of  elevating blood sugar and cholesterol.  He has tried all the mood stabilizers that do not have this warning.  All of the available mood stabilizers left that have any effectiveness are antipsychotics and the FDA has required a class warning on all antipsychotics about blood sugar and cholesterol effects.  This is true despite the fact that not all antipsychotics are equal at increasing these risks.  The patient's reading of these potential side effects is complicating finding an effective treatment.  The only way to prove whether or not these meds are elevating his blood sugar and cholesterol is to stop the medications for a period of time.  This exposes him to manic risk but there is no chance of finding an adequate mood stabilizer that will satisfy him until we can prove whether or not these meds are in fact affecting his blood sugar, cholesterol, blood pressure, and pulse. Therefore he will stop the loxapine which is at a low dose. We will check labs in 3 weeks.  We will attempt follow-up in 4 weeks.  If he gets manic he will let us know.   08/13/2020 appointment with the following noted: Blood sugar is pretty much the same off the loxapine but BP and pulse improved from 110 to 70 range.  But had started BP med about mid September. Started counselor Illene Labrador at [facility] and started nutritionsist.  Has changed and restricted diet. Sees PCP 07/24/20.  Endo is managing DM.   Mood has been really good.  Counseling helped the anxiety.  Getting 10 hours of sleep.  PCP suggested changing time going to bed earlier and it's helping.   Sometimes has racing thoughts and gets comments from others while playing video games that he needs to take a break. Patient reports stable mood and deniesr irritable moods.  Denies appetite disturbance.  Patient reports that energy and motivation have been good.  Patient denies any difficulty with concentration.  Patient denies any suicidal ideation.   Admits he's easily stressed.   10/09/20 appt with following noted: Getting manic as hell and depressed as hell. Talking too much and obsessing over things.  Talking too loud and doing things extremely fast.  Only depressed a couple of days ago.  Manic sx are brief and not all the time. Sleep 7-8 hours and sleep schedule is more normal. Stayed on Caplyta and pleased overall with SE. BS is better but hypertension is ongoing. Low TSH. Still counseling. No delusions hallucinations since off drugs. Give samples of Caplyta 42 mg once daily. Prefer no change befor ethe holidays bc he is not continuously manic or depressed. Likely switch to Jordan after the holidays.  11/19/2020 appointment with the following noted: Best  I've ever been except some controllable manic.  Able to do things fast and easily.  Anxiety medicine and counseling has helped.  Better function.  No SE. Not depressed in a while.  But not sure the Capylta helps mania.   Lost from 230# to 189# with diet and exercise over a long period.   Sleep 4-8 hours, but not that I can't sleep but don't feel like sleeping.  Not itching any more.  I'm a a whole new person.   I can't keep up the mania.  Driving my parents and friends crazy. Blood sugar better and off insulin.  Only taking metformin. Can't afford Caplyta. So feels he must change. Hydroxyzine really helped anxiety. Plan: DC Caplyta DT mania.  switch to Latuda 40 then 80 mg with food.  Pick up samples.  12/03/2020 phone call from parents:Parents called and want dr. Jennelle Human to know that Cody Short is maniac and he is having physical aggression and all he does is play video games 24 hours a day and is not sleeping. Parents feel like Cody Short is not giving you the whole picture and needs an adjustment to his medicine. MD response:atient acknowledged that his appointment on January 24 that he was manic while taking Caplyta.  He also acknowledged that his family saw him as being manic.  We  discontinued Caplyta and switched him to Jordan.  If he made the switch and increase the dose in a timely manner he should have been on 80 mg daily for a week now.  His parents are complaining that he is still manic.  Provided he is tolerating the Latuda reasonably well have him increase to 120 mg daily.  He can take 1-1/2 of the 80 mg tablets until he runs out and then we can send in a refill for 120 mg daily.  Remind him he needs to take that with at least 350 cal. Phone call with patient from nurse: Patient rtc and he did report he's doing great on the medication but is still having mania. Reports staying up all night playing video games then asleep about 5 am and back up at noon. He is taking medication with 350 calories also. Advised him to increase to 120 mg Latuda daily. He reports having only 40 mg tablets at home so instructed him to take 3 tablets and I would get more samples out for him to pick up tomorrow. He agreed and verbalized understanding.  12/07/2020 phone call: Patient called again about his Latuda. He states that it makes him really hungry after he takes it, he takes a nap and wakes up hungry again. He would like to know would it be okay to take before bed instead. MD response: He is unlikely to comply with recommendations about routine sleep and wake times.  He is in a habit of playing video games late into the night.  If he prefers he can split the dosage of the Latuda between breakfast and another meal.  But it is very important that he get the full dose in every day and take it with 350 cal. Nursing phone call with patient:Rtc to patient and he reports he has to take it all at hs because it makes him sleepy, I advised him to continue that regimen. He also reports not sleeping well but he's been drinking caffeine later in the day. Advised him to not drink any caffeine past 2-3 pm. He agreed. He reports his blood sugars have been elevated some, not as bad but  it is up some.   12/20/2020  appointment with the following noted: Mania tremendously better but occ anger outbursts anger.  6 hours of sleep with melatonin, hydroxyzine and Latuda 120 mg for 2 weeks.  Staying up all night playing video games. Kasandra Knudsen does make him hungry right after he takes it so takes it before bedtime.  Taking with food. Hyperactivity and loud talking is better.  I can function fast.  Parents see benefit with mania but are concerned about his anger outbursts but he things she takes stress out on him.  Blood sugar is generally good except when eats too much.  Taking it with dinner and HS.   Able to drive around town.  Can cook and clean.  Less ruminating on his past.  Less need for direction.   Counseling is helping at [facility] counseling. Hydroxyzine helped itching and anxiety.  Plan: Continue Latuda 120 mg PM.  Is getting benefit but only been on it for a couple of weeks.   01/17/2021 appointment with following noted: So so.  Some anger outburts a couple of times.  Eats too much.  Blood sugar is pretty bad.  Up at night playing video games and then sleeps too late in the day.  Takes Latuda at evening meal.  Wants to blame meds for these behaviors. Otherwise mood is "good I guess".  No serious highs or lows.  Can get in arguments with mother when she tries to correct him and may feel guilty afterwards. Takes hydroxyzine q 6 hours because of anxiety and bc dx thyroid inflamed, thyroiditis.  Asks if there's anything to help with overeating. Lost weight from thyroid.   Plan: Continue Latuda 120 mg as he is tolerating it and it appears to be controlling mania better than did the Caplyta and he is tolerating it better.  02/12/2021 appointment with the following noted: Perfectly good with mood and depression.  Needs hydroxyzine for itching and anxiety.  Energy poor with thyroiditis. Will sleep too much too bc gets bored and no physical activity. Varies 9-16 hours daily.  Can nap easily.  Not spacy.   No  anger lately. Tolerating Latuda well.   Still eats too many sweets late at night and gets high blood sugars.   Plan: Increase  topiramate 50 mg BID off label for weight loss and disc SE.  He wants to try something.  03/20/2021 appointment with the following noted: Taking meds.  A little benefit with increase topiramate. Mood is perfectly fine without mania nor depression. Blood sugar is a bit high. Claims doctor thinks rash could be related to [facility] but he sleeps all the time bc hypothyroidism. Has folliculitis Started treatment for it and it is helping.   Thyroid problem is a wait and see.  Could take a year to get better. Doesn't think topiramate added tiredness. Not taking hydroxyzine much about once daily. Dealing with difficult GM threatening sui if left alone.  Plan: Continue Latuda 120 mg PM.  Is getting benefit.. Better tolerated than others so far but he does say it makes him hungry but he has chronic impulse control problems including overeating.  05/06/2021 appointment with the following noted: Quit topiramate DT nausea and not helping at all. Rash resolved. Lipids not terrible but a little up.  TSH normalized. HGBA1C 9.3 Not exercising. Not doing much re: work etc other than video games. Not manic lately. Patient reports stable mood and denies depressed or irritable moods.  Patient denies any recent difficulty with  anxiety.   Denies appetite disturbance.  Patient reports that energy and motivation have been good.  Patient denies any difficulty with concentration.  Patient denies any suicidal ideation. Energy is better than it was but still sleeping 12 hours daily and up at night playing videos with friends. Plan: Reduce Latuda to 80 mg to reduce excessive sleepiness.  If mania then increase it back up 120 mg PM.  Is getting benefit..  06/20/21 TC:  Pt stated the cramps have spread to his arms and legs.He has been on latuda 80 mg qd for awhile now.He stated he is also going to  see a cardiologist due to heart spasms. MD response:  The cramps are not likely related to Jordan.  Usually it is related to dehydration although I see from chart review that he had elevated creatinine kinase from a statin and that was probably causing some of his problem.  He needs to make sure he drinks lots of water.  Especially because he is diabetic and that will make him prone to dehydration if he is not controlling his blood sugar. However the sleepiness might be related to Jordan.  He had previously been on 120 mg daily and we reduced it to 80 mg daily and if he is done okay with his mood we can try reducing it 1 more time to 60 mg.  I will send in a prescription for the 60 mg tablet and he can reduce it if he would like to see if the sleepiness is related to Jordan.  If Kasandra Knudsen is contributing to sleepiness it will get better if he reduces the dose.  If the sleepiness does not get better with the dosage reduction then Latuda is not the cause Reduce Latuda to 60 mg daily  07/30/2021 appt noted: Not much difference in alertness Mania since reducing Latuda. Hypoactive bc thryroid problems and may be the cause of the cramps per the other doctors. Itches badly without hydroxyzine. Spent $500 on gambling game and adeleted.  Past history of drug induced gambling but no major drugs in 15 years. Still exhausted and sleep all day.  But is taying up at night.  Sleeping 16 hours daily.  Wonders if hydroxyzine 25 is making him tired. Itching worse with stress and stress of GM and B crazy. Asks about bipolar shot. Side sleeper and as far as he knows no excessive snoring.  08/23/2021 phone call from patient's father stating that Cody Short had "gone off the rails" and it spent $1000 on the night prior doing online gambling and had gone back to staying up all night and sleeping during the day.  He had also been more verbally abusive.  It was suggested that the Latuda dose be increased from 60 to 80 mg in the  evening.  10/09/2021 appointment with the following noted: Sleeping a lot and easily exhausted even after med were changed.   SE a little constipation. History of problems with gambling in the past but not current.  Not thinking of it now. No other manic sx currently.   On Latuda 80 daily now.  Reduced hydroxyzine to 10 and still sleepy.  No other psych meds Plan: Continue Latuda 80 mg bc manic with less .  If recurrence of mania then increase it back up 120 mg PM.  Is getting benefit..  11/18/2021 phone call: Patient had complained of elevated CK levels and wondered if it could be related to Latuda.  Dr. Jennelle Human called Dr. Duanne Guess and discussed this issue.  It appears that his CK levels have actually been declining over the last several months and are not at a critical level.  It was agreed that this could be monitored over time and no immediate med change was necessary.  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   1 episode when got angy and said awful things to mother but then apologized.  Only 1 bad day. Otherwise mood has been pretty stable lately. Constipation and can't take miralax bc gives him diarrhea.   Sleep normalized to 8 hours since started his job.  So the med was not the source of the sleepiness. Counselor next Tuesday Only hydroxyzine is 10 mg HS Only other psych med is Latuda 80 mg daily Plan: Latuda  Been the best med so far for him.     Sleepiness was not better with reduction in Latuda from 120 to 60 mg daily. Continue Latuda 80 mg bc manic with less .  Option split dose to help sleepiness .   03/12/22 appt noted: Taking hydroxyzine BID Sleep study next week. 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  At [employer]. Doing well emotionally. Constipation and GI doc tomorrow DT fissure. BS is better A1C 7.4 instead of 9+ Sleep 8 hours.   The only thing he doesn't like is 4 meals daily bc hard to lose weight. Not much mania nor depression. Still anxious and worse without hydroxyzine. Consistent with Latuda at bedtime with food.   Rash bettter .  Itching managed with BID hydroxyzine. Plan: Continue Latuda 80 mg bc manic with less .  Option split dose to help sleepiness .  If recurrence of mania then increase it back up 120 mg PM.  Is getting benefit..  06/23/22 appt noted: Rough 2 weeks.  Larey Seat for 45 yo girl at work and was somewhat manic with her and she quit the job.  But is beginning to feel better now. Dx OSA CPAP and tremendous difference.  Not sleeping as much and more productive at work.  But still some initial sleepiness in the AM Not constipation or diarrhea but slow to move bowels but takes several times in the AM. Started Colace  2 daily for the last couple of days. Lost 10-15# and now stagnant. Working and eating less and weights off and on 3 weeks and trouble losing weight. At work asking too many questions and is too anxious.  Wonders about more hydroxyzine. Sober for 12 years. Can't split Latuda bc gets too sleepy and takes it too much at night. Had same job 6 mos.  Got employee of the month. Plan: no changes  07/08/22 tC CO more manic sx 07/30/22 TC: Pt stated generic latuda is not working for him.He is having manic symptoms such as cleaning his whole house,getting into arguments with his mom and getting agitated very easily.He also gambled $300.He is taking 80 mg  MD response : incr Latuda to 120 mg daily  09/25/22 appt noted: It made all the difference in the world with incr Latuda to 120 mg daily.  No sig mania or depression now. Increased stool softeners to 3 twice daily.  BS better with Jardiance and wt loss  19#. Tolerating Latuda.    No delusions or visions like before and no trouble some intrusive thoughts. Rash is better with clindamycin solution. Busy with work.  [employer] employee of the month twice at [employer].  37 hours per month. Sleep 8-9 hours. Hydroxyzine 10 HS helps sleep and can't take in daytime. SE can't lose wt on Latuda with medical help. Plan no med changes  02/04/23 TC: Patient is taking hydroxyzine and Latuda. He says he takes as prescribed, taking with enough calories. There was a work situation that caused a lot of stress and he was able to talk to someone to calm him down and provide clarity on the situation. He said that he spent $1000 on a gambling game that didn't even provide any pay out. He also said he got into an argument with 2 different customers and that he doesn't usually do this. He is also describing depression. He said he is sleeping 9 hours a day. Patient does not have pressured or rapid speech, seems to have a calm affect, but is concerned.     MD resp:  I'm out of town.  Only option I can give now is to increase the Latuda to 1 and 1/2 of the 120 mg tabs temporarily until mania resolves     02/04/23 appt noted: in person Increased Latuda to 180 mg daily since ehre bc manic sx. Hydroxyzine 10 HS hangover so taking 10 HS Now also Lautda 120 mg daily. Doing good overall.  No sig mania lately. Few days mild dep but not bad.   Some constipation issues.  Takign stool softeners.   Therapy with Elio Forget [facility] is helping a lot. Wonders if Jordan speeds up sex response.  No compulsive sex behaviors. Lost $7000 on gambling game.  Got banned from game and no other gambling problems now.   B drug addict and causes problems and asking for money and lying.   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   Sleep 8 hours.   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 Accutane  several mos.  Rash 90% better. Constipation better with stool softeners. Not much mood swings but dep occ.  7-8 hours sleep. Uses hydroxyzine for anxiety bc it remains high.  Too tired if more than 20 mg at a time. Meds Latuda 120 mg daily and hydroxyzine 10 BID.   Might have to go on Medicaid bc income reasons. Therapy with Elio Forget helped more than any other therapist.   Covid shut down Dad's business. B returned to drug use.  He's neglecting his kid.  B is big stress.   GM died 4 mos ago.   Can be impulsive and prone to addiction. Plan: ADD tx is reasonable bc job affected by difficulty with handling two tasks at once.  Forgetful and can't get back on task.  Work restrictions bc of it.  No regular stimulant. Ok trial modafinil off label for this problem.  Also to help OSA. Modafinil 100-then 200 mg AM.  09/09/23 appt noted: Modafinil is the best thing that ever happened to me.  Better focus, alertness, and function.  ADD "completely fixed".  More productive and boss noticed.   No SE.   Flare accutane causing pancreatitis.  Went to ER.  Scared of it and that it might be fatal.   Constipation resolved.   Lost 30# in 6 mos. CPAP is  better and sleep better.  Only 6-7 hours of sleep and was 8-9 hours.   Change to MCD 09/27/23.   Changing only PCP and keeping other doctors.   Better with wt loss and glucose now normal.  Dieting and exercise.  Losing more wt.   Psych meds: modafinil 200, Lurasidone 120 daily. Hydroxyzine 10 mg prn,   Prior psychiatric medications: risperidone with cognitive side effects, Abilify NR,  Zyprexa SE glucose, Seroquel 600 mg SE wt, perphenazineSE, Saphris 20 partial response but SE increase glucose , haloperidol EPS, Geodon EPS,  Vraylar, loxapine SE, Caplyta NR, Latuda 120 He's prone to EPS.  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 lithium  1500,   N-acetylcysteine with minimal benefit, ,  Modafinil 200 helped a  lot Amantadine with vomiting.  Sertraline and lexapro sexual SE,   Propranolol for tremor,   Ozempic GI px   Review of Systems:  Review of Systems  Constitutional:  Positive for fatigue.  Gastrointestinal:  Positive for constipation and rectal pain.  Endocrine: Positive for polydipsia and polyuria.  Genitourinary:  Negative for decreased urine volume and frequency.  Musculoskeletal:  Positive for myalgias.  Skin:  Positive for rash.       Itching resolved with hydroxyzine  Neurological:  Negative for tremors.  Psychiatric/Behavioral:  Positive for decreased concentration. Negative for agitation, behavioral problems, confusion, dysphoric mood, hallucinations, self-injury, sleep disturbance and suicidal ideas. The patient is hyperactive.     Medications: I have reviewed the patient's current medications.  Current Outpatient Medications  Medication Sig Dispense Refill   Accu-Chek Softclix Lancets lancets Use as instructed to check blood sugar 4 times daily. DX:E11.65 300 each 3   Adapalene-Benzoyl Peroxide 0.1-2.5 % gel SMARTSIG:1 Application Topical Every Evening     Blood Glucose Monitoring Suppl (CONTOUR NEXT MONITOR) w/Device KIT Check sugar 4 times daily 1 kit 0   Blood Glucose Monitoring Suppl (FREESTYLE LITE) DEVI Use as instructed to check sugar 4 times daily 1 each 0   dicyclomine (BENTYL) 20 MG tablet Take 1 tablet (20 mg total) by mouth 2 (two) times daily as needed for spasms. 20 tablet 0   Docusate Sodium (COLACE PO) Take 2 tablets by mouth daily.     empagliflozin (JARDIANCE) 25 MG TABS tablet Take 1 tablet (25 mg total) by mouth daily before breakfast. 90 tablet 3   ezetimibe (ZETIA) 10 MG tablet TAKE 1 TABLET (10 MG TOTAL) BY MOUTH DAILY AFTER SUPPER. 90 tablet 0   famotidine (PEPCID) 40 MG tablet Take 40 mg by mouth 2 (two) times daily. (Patient not taking: Reported on 08/31/2023)     fenofibrate  (TRICOR) 145 MG tablet TAKE 1 TABLET BY MOUTH EVERY DAY 90 tablet 0   glucose blood (ACCU-CHEK GUIDE) test strip USE AS INSTRUCTED TO CHECK BLOOD SUGAR 4 TIMES A DAY 300 strip 3   hydrOXYzine (ATARAX) 10 MG tablet TAKE 1 TABLET 3 TIMES A DAYAS NEEDED 270 tablet 0   insulin aspart (NOVOLOG FLEXPEN) 100 UNIT/ML FlexPen Inject 5-20 Units into the skin in the morning, at noon, in the evening, and at bedtime. 30 mL 3   Insulin Pen Needle (BD PEN NEEDLE NANO 2ND GEN) 32G X 4 MM MISC USE 4 TIMES A DAY WITH     INSULIN PENS 400 each 4   LEVEMIR FLEXPEN 100 UNIT/ML FlexPen INJECT 30 UNITS  SUBCUTANEOUSLY DAILY 30 mL 2   Lurasidone HCl 120 MG TABS TAKE 1 TABLET DAILY WITH   BREAKFAST (DOSE CHANGE) 90 tablet 1   metFORMIN (GLUCOPHAGE-XR) 500 MG 24 hr tablet TAKE 2 TABLETS (1,000MG     TOTAL) 2 TIMES DAILY WITH  MEALS 360 tablet 0   modafinil (PROVIGIL) 200 MG tablet Take 1 tablet each AM 90 tablet 0   ondansetron (ZOFRAN-ODT) 4 MG disintegrating tablet Take 1 tablet (4 mg total) by mouth every 8 (eight) hours as needed. 20 tablet 0   valsartan (DIOVAN) 160 MG tablet Take 160 mg by mouth daily.     No current facility-administered medications for this visit.    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 Recurs if cannot walk for a week.      TOBACCO ABUSE, HX OF 07/31/2009    Family History  Problem Relation Age of Onset   Diabetes Mother    Breast cancer Mother        was told not hereditary   Cirrhosis Mother        fatty liver   High blood pressure Mother    High blood pressure Father    Diabetes Father    Melanoma Maternal Uncle    Colon cancer Neg Hx    Stomach cancer Neg Hx    Pancreatitis Neg Hx    Heart disease Neg Hx    Kidney disease Neg Hx    Liver disease Neg Hx     Social History   Socioeconomic History   Marital status: Single    Spouse name: Not on file   Number of children: 0   Years of education: Not on file   Highest education level: Bachelor's degree (e.g., BA, AB, BS)  Occupational History   Not on file  Tobacco Use   Smoking status: Former    Current packs/day: 0.00    Average packs/day: 2.5 packs/day for 10.0 years (25.0 ttl pk-yrs)    Types: Cigarettes    Start date: 10/27/1996    Quit date: 10/27/2006    Years since quitting: 16.8   Smokeless tobacco: Never  Vaping Use   Vaping status: Never Used  Substance and Sexual Activity   Alcohol use: Not Currently    Comment: none at all   Drug use: No   Sexual activity: Not on file  Other Topics Concern   Not on file  Social History Narrative   Single. Not dating currently. No kids.    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   Hobbies: video games  PC   Caffeine: 2 C a day   Social Determinants of Health   Financial Resource Strain: High Risk (09/09/2023)   Overall Financial Resource Strain (CARDIA)    Difficulty of Paying Living Expenses: Very hard  Food Insecurity: No Food Insecurity (09/09/2023)   Hunger Vital Sign    Worried About Running Out of Food in the Last Year: Never true    Ran Out of Food in the Last Year: Never true  Transportation Needs: No Transportation  Needs (09/09/2023)   PRAPARE - Administrator, Civil Service (Medical): No    Lack of Transportation (Non-Medical): No  Physical Activity: Sufficiently Active (09/09/2023)   Exercise Vital Sign    Days of Exercise per Week: 5 days    Minutes of Exercise per Session: 150+ min  Stress: No Stress Concern Present (09/09/2023)   [employer] of Occupational Health - Occupational Stress Questionnaire    Feeling of Stress : Not at all  Social Connections: Moderately Isolated (09/09/2023)   Social Connection and Isolation Panel [NHANES]    Frequency of Communication with Friends and Family: More than three times a week    Frequency of Social Gatherings with Friends and Family: More than three times a week    Attends Religious Services: Never    Database administrator or Organizations: Yes    Attends Banker Meetings: Never    Marital Status: Never married  Catering manager Violence: Not on file    Past Medical History, Surgical history, Social history, and Family history were reviewed and updated as appropriate.   Please see review of systems for further details on the patient's review from today.   Objective:   Physical Exam:  There were no vitals taken for this visit.  Physical Exam Constitutional:      General: He is not in acute distress. Musculoskeletal:        General: No deformity.  Neurological:     Mental Status: He is alert and oriented to person, place, and time.     Cranial Nerves: No dysarthria.     Coordination: Coordination normal.  Psychiatric:        Attention and Perception: Attention and perception normal. He does not perceive auditory or visual hallucinations.        Mood and Affect: Mood is not anxious, depressed or elated. Affect is not labile, blunt, angry or inappropriate.        Speech: Speech normal. Speech is not rapid and pressured or slurred.        Behavior: Behavior normal. Behavior is not agitated, slowed, aggressive or  hyperactive. Behavior is cooperative.        Thought Content: Thought content normal. Thought content is not paranoid or delusional. Thought content does not include homicidal or suicidal ideation. Thought content does not include suicidal plan.        Cognition and Memory: Cognition and memory normal.        Judgment: Judgment normal.     Comments: No paranoia.   Insight and judgment fair to poor. Chronically self-preoccupied and hyperthymic style better but not gone. Much Less pressured and less manic  Talkative Looks more alert.       Lab Review:     Component Value Date/Time   NA 138 09/04/2023 1210   K 3.9 09/04/2023 1210   CL 106 09/04/2023 1210   CO2 20 (L) 09/04/2023 1210   GLUCOSE 131 (H) 09/04/2023 1210   BUN 17 09/04/2023  1210   CREATININE 1.30 (H) 09/04/2023 1210   CREATININE 1.24 06/18/2018 1502   CALCIUM 10.3 09/04/2023 1210   PROT 7.9 09/04/2023 1210   ALBUMIN 4.8 09/04/2023 1210   AST 25 09/04/2023 1210   ALT 23 09/04/2023 1210   ALKPHOS 46 09/04/2023 1210   BILITOT 0.5 09/04/2023 1210   GFRNONAA >60 09/04/2023 1210   GFRNONAA 84 04/02/2018 1551   GFRAA 97 04/02/2018 1551       Component Value Date/Time   WBC 5.9 09/04/2023 1210   RBC 4.70 09/04/2023 1210   HGB 13.5 09/04/2023 1210   HCT 40.6 09/04/2023 1210   PLT 237 09/04/2023 1210   MCV 86.4 09/04/2023 1210   MCH 28.7 09/04/2023 1210   MCHC 33.3 09/04/2023 1210   RDW 13.5 09/04/2023 1210   LYMPHSABS 1.7 09/04/2023 1210   MONOABS 0.4 09/04/2023 1210   EOSABS 0.1 09/04/2023 1210   BASOSABS 0.0 09/04/2023 1210    Lithium Lvl  Date Value Ref Range Status  09/09/2019 1.1 0.6 - 1.2 mmol/L Final    12/14/19 lithium level 0.6 (trough 13 hours) , normal BMP and calcium   No results found for: "PHENYTOIN", "PHENOBARB", "VALPROATE", "CBMZ"   .res Assessment: Plan:    Rijul "Cody Short" was seen today for follow-up and anxiety.  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  It has been evident in chronic occupational dysfunction and some relational problems.  He is also been very prone to EPS and elevated blood sugars from atypicals making it difficult to get adequate mood stabilization.   Has a history of severe pancreatitis from Depakote.  He did not have an adequate response to carbamazepine.   lithium was insufficient to control his symptoms in monotherapy and he developed diabetes insipidus.  There are no available mood stabilizers that do not have a warning of elevating blood sugar and cholesterol.  He has tried all the mood stabilizers that do not have this warning.  All of the available mood stabilizers left that have any effectiveness are antipsychotics and the FDA has required a class warning on all antipsychotics about blood sugar and cholesterol effects.  This is true despite the fact that not all antipsychotics are equal and increasing these risks.  The patient's reading of these potential side effects is complicating finding an effective treatment.  The only way to prove whether or not these meds are elevating his blood sugar and cholesterol is to stop the medications for a period of time.  This exposes him to manic risk but there is no chance of finding an adequate mood stabilizer that will satisfy him until we can prove whether or not these meds are in fact affecting his blood sugar, cholesterol, blood pressure, and pulse.  Latuda  Been the best med so far for him.    Answered questions with no other good options  obvious. Sleepiness was not better with reduction in Latuda from 120 to 60 mg daily. Continue Latuda 120 mg PM.  Is getting benefit.Marland Kitchen He still thinks it affects BS but it is controlled.   Marland Kitchen  Discussed potential metabolic side effects associated with atypical antipsychotics, as well as potential risk for movement side effects. Advised pt to contact office if movement side effects occur.   continue Hydroxyzine 10 mg HS prn  This has been helpful for anxiety and itching and may have some mild antimanic effect.  Consider modafinil if necessary for function.  Disc risk of mania.  Disc SE.    He has a low stress tolerance.  He can be easily agitated in public. OK to schedule with Elio Forget for therapy  He has continued abstinence from marijuana is encouraged.  He has a history of cannabinoid hyperemesis which finally convinced him to discontinue the marijuana.  His mood disorder and thought disorganization have improved since being off the marijuana.  He has a very long history of heavy marijuana dependence.   Constipation management effective  Sleep hygiene. Disc OSA and how it affects alertness and started CPAP and much better.    ADD tx is reasonable bc job affected by difficulty with handling two tasks at once.  Forgetful and can't get back on task.  Work restrictions bc of it.  No regular stimulant. Ok trial modafinil off label for this problem.  Also to help OSA.  Much less likley to trigger psychosis than traditional stimulants. Continue DT marked benefit: Modafinil 200 mg AM.  No med changes  FU 3 mos  Meredith Staggers, MD, DFAPA  Please see After Visit Summary for patient specific instructions.  Future Appointments  Date Time Provider Department Center  09/10/2023  2:00 PM Corwin Levins, MD [facility] None  09/15/2023  3:00 PM Waldron Session, [facility] None  10/15/2023  3:00 PM Waldron Session, [facility] None  12/09/2023  3:00 PM Shawnie Dapper, NP [facility] None   12/29/2023  3:20 PM Carlus Pavlov, MD [facility] None    No orders of the defined types were placed in this encounter.      -------------------------------

## 2023-09-10 ENCOUNTER — Ambulatory Visit (INDEPENDENT_AMBULATORY_CARE_PROVIDER_SITE_OTHER): Payer: 59 | Admitting: Internal Medicine

## 2023-09-10 ENCOUNTER — Encounter: Payer: Self-pay | Admitting: Internal Medicine

## 2023-09-10 VITALS — BP 126/72 | HR 74 | Temp 98.1°F | Ht 70.0 in | Wt 217.0 lb

## 2023-09-10 DIAGNOSIS — Z0000 Encounter for general adult medical examination without abnormal findings: Secondary | ICD-10-CM | POA: Diagnosis not present

## 2023-09-10 DIAGNOSIS — Z23 Encounter for immunization: Secondary | ICD-10-CM

## 2023-09-10 DIAGNOSIS — E1165 Type 2 diabetes mellitus with hyperglycemia: Secondary | ICD-10-CM

## 2023-09-10 DIAGNOSIS — I10 Essential (primary) hypertension: Secondary | ICD-10-CM | POA: Diagnosis not present

## 2023-09-10 DIAGNOSIS — Z8719 Personal history of other diseases of the digestive system: Secondary | ICD-10-CM

## 2023-09-10 DIAGNOSIS — Z7984 Long term (current) use of oral hypoglycemic drugs: Secondary | ICD-10-CM

## 2023-09-10 DIAGNOSIS — K600 Acute anal fissure: Secondary | ICD-10-CM | POA: Insufficient documentation

## 2023-09-10 DIAGNOSIS — E781 Pure hyperglyceridemia: Secondary | ICD-10-CM

## 2023-09-10 DIAGNOSIS — J4520 Mild intermittent asthma, uncomplicated: Secondary | ICD-10-CM

## 2023-09-10 DIAGNOSIS — K5900 Constipation, unspecified: Secondary | ICD-10-CM | POA: Insufficient documentation

## 2023-09-10 DIAGNOSIS — Z0001 Encounter for general adult medical examination with abnormal findings: Secondary | ICD-10-CM

## 2023-09-10 DIAGNOSIS — K219 Gastro-esophageal reflux disease without esophagitis: Secondary | ICD-10-CM | POA: Insufficient documentation

## 2023-09-10 NOTE — Progress Notes (Deleted)
Patient ID: Cody Short, male   DOB: [birth year], 47 y.o.   MRN: [id number]

## 2023-09-10 NOTE — Progress Notes (Signed)
Chief Complaint:: wellness exam and dm, recurrent pancreatitis, hypertriglyceridemia, asthma       HPI:  Cody Short is a 47 y.o. male here for wellness exam; declines immunizations, for hep c screen, due for flu shot today o/w up to date                        Also Pt denies chest pain, increased sob or doe, wheezing, orthopnea, PND, increased LE swelling, palpitations, dizziness or syncope.   Pt denies polydipsia, polyuria, or new focal neuro s/s.    Pt denies fever, wt loss, night sweats, loss of appetite, or other constitutional symptoms  Also sees psychiatry, counseling and endo on regular basis. Has hx of pancreatitis pt states was due to depakote then more recently accutate, though also has hx of elevated triglycerides.       Wt Readings from Last 3 Encounters:  09/10/23 217 lb (98.4 kg)  08/31/23 214 lb 3.2 oz (97.2 kg)  04/27/23 222 lb (100.7 kg)   BP Readings from Last 3 Encounters:  09/10/23 126/72  09/04/23 128/87  08/31/23 120/70   Immunization History  Administered Date(s) Administered   Influenza Split 11/27/2011, 07/19/2012   Influenza Whole 07/29/2008, 07/26/2009, 07/22/2010   Influenza, Seasonal, Injecte, Preservative Fre 09/10/2023   Influenza,inj,Quad PF,6+ Mos 08/15/2013, 06/27/2015, 08/11/2016, 09/01/2017, 06/18/2018   Influenza-Unspecified 08/31/2014   Pneumococcal Conjugate-13 08/31/2014   Pneumococcal Polysaccharide-23 12/24/2015   Td 07/22/2010   Health Maintenance Due  Topic Date Due   Hepatitis C Screening  Never done   DTaP/Tdap/Td (2 - Tdap) 07/22/2020   COVID-19 Vaccine (1 - 2023-24 season) Never done      Past Medical History:  Diagnosis Date   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 Recurs if cannot walk for a week.      TOBACCO ABUSE, HX OF 07/31/2009   Past Surgical History:  Procedure Laterality Date   pilar cystectomy      reports that he quit smoking about 16 years ago. His smoking use included cigarettes. He started smoking about 26 years ago. He has a 25 pack-year smoking history. He has never used smokeless tobacco. He reports that he does not currently use alcohol. He reports that he does not use drugs. family history includes Breast cancer in his mother; Cirrhosis in his mother; Diabetes in his father and mother; High blood pressure in his father and mother; Melanoma in his maternal uncle. Allergies  Allergen Reactions   Divalproex Sodium Other (See Comments)    unknown   Methylphenidate Hcl     Aggravate bipolar   Oxycodone Hcl     REACTION: hallucinations   Ozempic (0.25 Or 0.5 Mg-Dose) [Semaglutide(0.25 Or 0.5mg -Dos)] Nausea Only   Paroxetine     Aggravate bipolar disorder   Rosuvastatin Other (See Comments)    Myopathy -muscle cramps, elevated CK   Current Outpatient Medications on File Prior to Visit  Medication Sig Dispense Refill   Accu-Chek Softclix Lancets lancets Use as instructed to check blood sugar 4 times daily. DX:E11.65 300 each 3   Adapalene-Benzoyl Peroxide 0.1-2.5 % gel SMARTSIG:1 Application Topical Every  Evening     Blood Glucose Monitoring Suppl (CONTOUR NEXT MONITOR) w/Device KIT Check sugar 4 times daily 1 kit 0   Blood Glucose Monitoring Suppl (FREESTYLE LITE) DEVI Use as instructed to check sugar 4 times daily 1 each 0   dicyclomine (BENTYL) 20 MG tablet Take 1 tablet (20 mg total) by mouth 2 (two) times daily as needed for spasms. 20 tablet 0   Docusate Sodium (COLACE PO) Take 2 tablets by mouth daily.     empagliflozin (JARDIANCE) 25 MG TABS tablet Take 1 tablet (25 mg total) by mouth daily before breakfast. 90 tablet 3   famotidine (PEPCID) 40 MG tablet Take 40 mg by mouth 2 (two) times daily.      fenofibrate (TRICOR) 145 MG tablet TAKE 1 TABLET BY MOUTH EVERY DAY 90 tablet 0   glucose blood (ACCU-CHEK GUIDE) test strip USE AS INSTRUCTED TO CHECK BLOOD SUGAR 4 TIMES A DAY 300 strip 3   hydrOXYzine (ATARAX) 10 MG tablet TAKE 1 TABLET 3 TIMES A DAYAS NEEDED 270 tablet 0   insulin aspart (NOVOLOG FLEXPEN) 100 UNIT/ML FlexPen Inject 5-20 Units into the skin in the morning, at noon, in the evening, and at bedtime. 30 mL 3   Insulin Pen Needle (BD PEN NEEDLE NANO 2ND GEN) 32G X 4 MM MISC USE 4 TIMES A DAY WITH     INSULIN PENS 400 each 4   LEVEMIR FLEXPEN 100 UNIT/ML FlexPen INJECT 30 UNITS            SUBCUTANEOUSLY DAILY 30 mL 2   Lurasidone HCl 120 MG TABS TAKE 1 TABLET DAILY WITH   BREAKFAST (DOSE CHANGE) 90 tablet 1   metFORMIN (GLUCOPHAGE-XR) 500 MG 24 hr tablet TAKE 2 TABLETS (1,000MG     TOTAL) 2 TIMES DAILY WITH  MEALS 360 tablet 0   modafinil (PROVIGIL) 200 MG tablet Take 1 tablet each AM 90 tablet 0   ondansetron (ZOFRAN-ODT) 4 MG disintegrating tablet Take 1 tablet (4 mg total) by mouth every 8 (eight) hours as needed. 20 tablet 0   valsartan (DIOVAN) 160 MG tablet Take 160 mg by mouth daily.     ezetimibe (ZETIA) 10 MG tablet TAKE 1 TABLET (10 MG TOTAL) BY MOUTH DAILY AFTER SUPPER. 90 tablet 0   No current facility-administered medications on file prior to visit.        ROS:  All others reviewed and negative.  Objective        PE:  BP 126/72 (BP Location: Right Arm, Patient Position: Sitting, Cuff Size: Normal)   Pulse 74   Temp 98.1 F (36.7 C) (Oral)   Ht 5\' 10"  (1.778 m)   Wt 217 lb (98.4 kg)   SpO2 99%   BMI 31.14 kg/m                 Constitutional: Pt appears in NAD               HENT: Head: NCAT.                Right Ear: External ear normal.                 Left Ear: External ear normal.                Eyes: . Pupils are equal, round, and reactive to light. Conjunctivae and EOM are normal               Nose: without d/c or deformity  Neck: Neck  supple. Gross normal ROM               Cardiovascular: Normal rate and regular rhythm.                 Pulmonary/Chest: Effort normal and breath sounds without rales or wheezing.                Abd:  Soft, NT, ND, + BS, no organomegaly               Neurological: Pt is alert. At baseline orientation, motor grossly intact               Skin: Skin is warm. No rashes, no other new lesions, LE edema - none               Psychiatric: Pt behavior is normal without agitation   Micro: none  Cardiac tracings I have personally interpreted today:  none  Pertinent Radiological findings (summarize): none   Lab Results  Component Value Date   WBC 5.9 09/04/2023   HGB 13.5 09/04/2023   HCT 40.6 09/04/2023   PLT 237 09/04/2023   GLUCOSE 131 (H) 09/04/2023   CHOL 225 (H) 02/10/2022   TRIG 237 (H) 02/10/2022   HDL 37 (L) 02/10/2022   LDLDIRECT 120.0 04/02/2018   LDLCALC 145 (H) 02/10/2022   ALT 23 09/04/2023   AST 25 09/04/2023   NA 138 09/04/2023   K 3.9 09/04/2023   CL 106 09/04/2023   CREATININE 1.30 (H) 09/04/2023   BUN 17 09/04/2023   CO2 20 (L) 09/04/2023   TSH 1.46 08/31/2023   HGBA1C 8.2 04/27/2023   MICROALBUR 3.2 (H) 08/31/2023   Assessment/Plan:  Cody Short is a 47 y.o. White or Caucasian [1] male with  has a past medical history of Acne varioliformis (07/04/2010), Anxiety, Bipolar affective disorder (HCC), Depression, DM w/o Complication Type II (07/05/2007), High cholesterol, Hypertension, HYPERTRIGLYCERIDEMIA (10/14/2010), Nocturia (07/04/2010), PILAR CYST (08/03/2007), and TOBACCO ABUSE, HX OF (07/31/2009).  Encounter for well adult exam with abnormal findings Age and sex appropriate education and counseling updated with regular exercise and diet Referrals for preventative services - for hep c screen Immunizations addressed - declines immunizations, except for flu shot today Smoking counseling  - none needed Evidence for depression or other mood disorder -  stable mood for now Most recent labs reviewed. I have personally reviewed and have noted: 1) the patient's medical and social history 2) The patient's current medications and supplements 3) The patient's height, weight, and BMI have been recorded in the chart   Type 2 diabetes mellitus with hyperglycemia, without long-term current use of insulin (HCC) Lab Results  Component Value Date   HGBA1C 8.2 04/27/2023   uncontrolled, pt to continue current medical treatment jardianc 25 every day, novolog SSI, levemir 30 u every day, metformin ER 500 - 2 qd   HYPERTRIGLYCERIDEMIA Lab Results  Component Value Date   CHOL 225 (H) 02/10/2022   HDL 37 (L) 02/10/2022   LDLCALC 145 (H) 02/10/2022   LDLDIRECT 120.0 04/02/2018   TRIG 237 (H) 02/10/2022   CHOLHDL 4.6 08/06/2020   Much improved at last lab but with severe elevated LDL, pt states last lab was 203, now off fenofibrate, cont dm low chol diet, declines restart fenofibrate or statin for now, for f/u lab next visit  Hx of pancreatitis Pt states related to depakote and accutane, but also with elevated TG - for diet as above  Essential  hypertension BP Readings from Last 3 Encounters:  09/10/23 126/72  09/04/23 128/87  08/31/23 120/70   Stable, pt to continue medical treatment diovan 160 mg qd   Asthma Stable recently,  to f/u any worsening symptoms or concerns   Followup: Return in about 6 months (around 03/09/2024).  Oliver Barre, MD 09/12/2023 4:54 PM [facility]

## 2023-09-10 NOTE — Patient Instructions (Signed)
You had the flu shot today  Please continue all other medications as before, and refills have been done if requested.  Please have the pharmacy call with any other refills you may need.  Please continue your efforts at being more active, low cholesterol diet, and weight control.  You are otherwise up to date with prevention measures today.  Please keep your appointments with your specialists as you may have planned - endocrinology  No labs needed today  Please make an Appointment to return in 6 months, or sooner if needed

## 2023-09-12 ENCOUNTER — Encounter: Payer: Self-pay | Admitting: Internal Medicine

## 2023-09-12 DIAGNOSIS — Z0001 Encounter for general adult medical examination with abnormal findings: Secondary | ICD-10-CM | POA: Insufficient documentation

## 2023-09-12 NOTE — Assessment & Plan Note (Signed)
BP Readings from Last 3 Encounters:  09/10/23 126/72  09/04/23 128/87  08/31/23 120/70   Stable, pt to continue medical treatment diovan 160 mg qd

## 2023-09-12 NOTE — Assessment & Plan Note (Addendum)
Lab Results  Component Value Date   CHOL 225 (H) 02/10/2022   HDL 37 (L) 02/10/2022   LDLCALC 145 (H) 02/10/2022   LDLDIRECT 120.0 04/02/2018   TRIG 237 (H) 02/10/2022   CHOLHDL 4.6 08/06/2020   Much improved at last lab but with severe elevated LDL, pt states last lab was 203, now off fenofibrate, cont dm low chol diet, declines restart fenofibrate or statin for now, for f/u lab next visit

## 2023-09-12 NOTE — Addendum Note (Signed)
Addended by: Corwin Levins on: 09/12/2023 04:57 PM   Modules accepted: Level of Service

## 2023-09-12 NOTE — Assessment & Plan Note (Signed)
Pt states related to depakote and accutane, but also with elevated TG - for diet as above

## 2023-09-12 NOTE — Assessment & Plan Note (Signed)
Lab Results  Component Value Date   HGBA1C 8.2 04/27/2023   uncontrolled, pt to continue current medical treatment jardianc 25 every day, novolog SSI, levemir 30 u every day, metformin ER 500 - 2 qd

## 2023-09-12 NOTE — Assessment & Plan Note (Signed)
Stable recently,  to f/u any worsening symptoms or concerns

## 2023-09-12 NOTE — Assessment & Plan Note (Signed)
Age and sex appropriate education and counseling updated with regular exercise and diet Referrals for preventative services - for hep c screen Immunizations addressed - declines immunizations, except for flu shot today Smoking counseling  - none needed Evidence for depression or other mood disorder - stable mood for now Most recent labs reviewed. I have personally reviewed and have noted: 1) the patient's medical and social history 2) The patient's current medications and supplements 3) The patient's height, weight, and BMI have been recorded in the chart

## 2023-09-15 ENCOUNTER — Ambulatory Visit (INDEPENDENT_AMBULATORY_CARE_PROVIDER_SITE_OTHER): Payer: 59 | Admitting: Mental Health

## 2023-09-15 DIAGNOSIS — F3110 Bipolar disorder, current episode manic without psychotic features, unspecified: Secondary | ICD-10-CM

## 2023-09-15 NOTE — Progress Notes (Signed)
[facility]  Name: Cody Short Date:  09/15/23 MRN: [id number] DOB: 1975/12/21 PCP: Corwin Levins, MD  Time spent: 51 minutes Time in: 3: 00 PM time out: 3:51 PM  Treatment:   ind. Therapy   Mental Status Exam:    Appearance:    Casual     Behavior:   Appropriate  Motor:   WNL  Speech/Language:    Clear and Coherent  Affect:   Full range   Mood:   Euthymic, anxious  Thought process:   Logical, linear, goal directed  Thought content:     WNL  Sensory/Perceptual disturbances:     none  Orientation:   x4  Attention:   Good  Concentration:   Good  Memory:   Intact  Fund of knowledge:    Consistent with age and development  Insight:     Good  Judgment:    Good  Impulse Control:   Good     Reported Symptoms:  anxiety, rumination, intermittent depressed mood, impulsivity  Risk Assessment: Danger to Self:  No Self-injurious Behavior: No Danger to Others: No Duty to Warn:no Physical Aggression / Violence:No  Access to Firearms a concern: No  Gang Involvement:No  Patient / guardian was educated about steps to take if suicide or homicide risk level increases between visits: yes While future psychiatric events cannot be accurately predicted, the patient does not currently require acute inpatient psychiatric care and does not currently meet [facility] involuntary commitment criteria.  Subjective:  Patient arrived on time.  Patient shared progress, how he had a pleasant birthday recently with family, receiving a gift from his father.  He stated that he would like to have more positive compliments from his parents he stated that this is not something he is growing up experiencing.  He stated his brother continues to have significant drug abuse issues and the family continues to set some boundaries with him.  Patient went on to explore wanting to work on his self-confidence.  He stated that he can be critical of how he looks physically.  We explored  this collaboratively, facilitating his identifying associated cognitions that were not helpful, self critical.  Worked with patient to reframe.  Gave him homework assignment to document self affirming statements further discussing the benefits of self acceptance.  He stated that he has lost a considerable amount of weight, about 30 pounds over the last 6 months and plans to continue his diet and exercise toward self-care.    Interventions:  motivational interviewing, CBT    Diagnoses:  No diagnosis found.      Plan: Patient to utilize coping skills as discussed, continue his medication compliance to maintain mood stability. patient will continue to work on identifying and challenging his obsessive thoughts, particularly regarding relationships, and practicing grounding techniques to manage his anxiety. He will also be encouraged to celebrate his achievements at work and continue to take ownership of his successes.  Long-term goals:   Maintain symptom reduction: The patient will report sustained reduction in symptoms of anxiety using both CBT and mindfulness interventions for 3 consecutive months progressively.  Improve emotional regulation: The patient will learn and apply CBT and mindfulness-based strategies to regulate emotions, such as mindfulness-based stress reduction and cognitive restructuring, and report an improvement in emotional regulation for at least 3 consecutive months progressively.    Short-term goal:  The patient will learn and apply CBT and mindfulness-based coping skills for managing anxiety and practice using it between sessions.  2.   The patient will CBT and mindfulness-based interventions to increase awareness of negative thought patterns. 3.   The patient will keep and maintain employment to keep financial stress manageable       4.   The patient will decrease anxiety and stress when driving, when working with customers and dealing with family  issues.         Waldron Session, [facility]

## 2023-09-18 ENCOUNTER — Telehealth: Payer: Self-pay

## 2023-09-18 ENCOUNTER — Encounter: Payer: Self-pay | Admitting: Internal Medicine

## 2023-09-18 NOTE — Telephone Encounter (Signed)
Transition Care Management Follow-up Telephone Call Date of discharge and from where: 09/04/2023 [facility] How have you been since you were released from the hospital? Patient stated he is feeling much better. Any questions or concerns? No  Items Reviewed: Did the pt receive and understand the discharge instructions provided? Yes  Medications obtained and verified? Yes  Other? No  Any new allergies since your discharge? No  Dietary orders reviewed? Yes Do you have support at home? Yes   Follow up appointments reviewed:  PCP Hospital f/u appt confirmed? Yes  Scheduled to see Corwin Levins, MD on 09/10/2023 @ [facility] [place]. Specialist Hospital f/u appt confirmed? No  Scheduled to see  on  @ . Are transportation arrangements needed? No  If their condition worsens, is the pt aware to call PCP or go to the Emergency Dept.? Yes Was the patient provided with contact information for the PCP's office or ED? Yes Was to pt encouraged to call back with questions or concerns? Yes   Renisha Cockrum [facility]  [facility] Guide Direct Dial: [phone]  Website: [email or web address]

## 2023-09-28 ENCOUNTER — Ambulatory Visit: Payer: 59 | Admitting: Internal Medicine

## 2023-10-08 ENCOUNTER — Encounter: Payer: Self-pay | Admitting: Internal Medicine

## 2023-10-08 DIAGNOSIS — E1165 Type 2 diabetes mellitus with hyperglycemia: Secondary | ICD-10-CM

## 2023-10-09 MED ORDER — METFORMIN HCL ER 500 MG PO TB24: 1000.0000 mg | ORAL_TABLET | Freq: Two times a day (BID) | ORAL | 2 refills | Status: DC

## 2023-10-15 ENCOUNTER — Ambulatory Visit (INDEPENDENT_AMBULATORY_CARE_PROVIDER_SITE_OTHER): Payer: Medicaid Other | Admitting: Mental Health

## 2023-10-15 DIAGNOSIS — F411 Generalized anxiety disorder: Secondary | ICD-10-CM | POA: Diagnosis not present

## 2023-10-15 DIAGNOSIS — F3110 Bipolar disorder, current episode manic without psychotic features, unspecified: Secondary | ICD-10-CM

## 2023-10-15 NOTE — Progress Notes (Signed)
[facility]  Name: Cody Short Date:  10/15/23 MRN: [id number] DOB: 15-Jul-1976 PCP: Corwin Levins, MD  Time spent: 51 minutes Time in: 3: 00 PM time out: 3:51 PM  Treatment:   ind. Therapy   Mental Status Exam:    Appearance:    Casual     Behavior:   Appropriate  Motor:   WNL  Speech/Language:    Clear and Coherent  Affect:   Full range   Mood:   Euthymic, anxious  Thought process:   Logical, linear, goal directed  Thought content:     WNL  Sensory/Perceptual disturbances:     none  Orientation:   x4  Attention:   Good  Concentration:   Good  Memory:   Intact  Fund of knowledge:    Consistent with age and development  Insight:     Good  Judgment:    Good  Impulse Control:   Good     Reported Symptoms:  anxiety, rumination, intermittent depressed mood, impulsivity  Risk Assessment: Danger to Self:  No Self-injurious Behavior: No Danger to Others: No Duty to Warn:no Physical Aggression / Violence:No  Access to Firearms a concern: No  Gang Involvement:No  Patient / guardian was educated about steps to take if suicide or homicide risk level increases between visits: yes While future psychiatric events cannot be accurately predicted, the patient does not currently require acute inpatient psychiatric care and does not currently meet [facility] involuntary commitment criteria.  Subjective:  Patient arrived on time for today's session.  Assessed progress for patient's shared how he continues to have strain in the relationship with his parents.  He stated that they often do not want to talk to him about issues such as work and other stressors.  He stated that he feels alone and often, has some friends but he stated they often do not want to hear about his issues as he stated that he has told them too often.  He identified wanting a girlfriend, thinking about past relationships, reports tending to obsess at times.  He also shared his anxiety  while driving persists and can often feel anxious about other issues such as long-term financial worries.  Reviewed coping, discussed thought blocking, integrating the concept into discussion related to patient's issues.  Patient expressed understanding and plan to follow through consistently for at least 1 week daily to block thoughts specifically that are self-critical about his physical appearance which has been a chronic pattern.  He stated that he continues to thrive and do well at work, achieving company goals and winning some recent awards however, he stated that he does not feel he has a future at [employer] as he has wanted to be promoted and this is alluded him; he feels that he is not being selected based on some factors shared in session.     Interventions:  motivational interviewing, CBT    Diagnoses:    ICD-10-CM   1. Bipolar I disorder, most recent episode (or current) manic (HCC)  F31.10     2. Generalized anxiety disorder  F41.1           Plan: Patient to utilize coping skills as discussed, continue his medication compliance to maintain mood stability. patient will continue to work on identifying and challenging his obsessive thoughts, particularly regarding relationships, and practicing grounding techniques to manage his anxiety. He will also be encouraged to celebrate his achievements at work and continue to take ownership of his successes.  Long-term goals:   Maintain symptom reduction: The patient will report sustained reduction in symptoms of anxiety using both CBT and mindfulness interventions for 3 consecutive months progressively.  Improve emotional regulation: The patient will learn and apply CBT and mindfulness-based strategies to regulate emotions, such as mindfulness-based stress reduction and cognitive restructuring, and report an improvement in emotional regulation for at least 3 consecutive months progressively.    Short-term goal:  The patient will learn  and apply CBT and mindfulness-based coping skills for managing anxiety and practice using it between sessions.       2.   The patient will CBT and mindfulness-based interventions to increase awareness of negative thought patterns. 3.   The patient will keep and maintain employment to keep financial stress manageable       4.   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         Waldron Session, [facility]

## 2023-10-23 ENCOUNTER — Telehealth: Payer: Self-pay | Admitting: Psychiatry

## 2023-10-23 ENCOUNTER — Encounter: Payer: Self-pay | Admitting: Internal Medicine

## 2023-10-23 ENCOUNTER — Other Ambulatory Visit: Payer: Self-pay

## 2023-10-23 DIAGNOSIS — F4001 Agoraphobia with panic disorder: Secondary | ICD-10-CM

## 2023-10-23 DIAGNOSIS — F411 Generalized anxiety disorder: Secondary | ICD-10-CM

## 2023-10-23 DIAGNOSIS — G4733 Obstructive sleep apnea (adult) (pediatric): Secondary | ICD-10-CM

## 2023-10-23 DIAGNOSIS — E1165 Type 2 diabetes mellitus with hyperglycemia: Secondary | ICD-10-CM

## 2023-10-23 DIAGNOSIS — F902 Attention-deficit hyperactivity disorder, combined type: Secondary | ICD-10-CM

## 2023-10-23 DIAGNOSIS — F3110 Bipolar disorder, current episode manic without psychotic features, unspecified: Secondary | ICD-10-CM

## 2023-10-23 DIAGNOSIS — L299 Pruritus, unspecified: Secondary | ICD-10-CM

## 2023-10-23 MED ORDER — LURASIDONE HCL 120 MG PO TABS: ORAL_TABLET | ORAL | 1 refills | Status: DC

## 2023-10-23 MED ORDER — HYDROXYZINE HCL 10 MG PO TABS: 10.0000 mg | ORAL_TABLET | Freq: Three times a day (TID) | ORAL | 0 refills | Status: DC | PRN

## 2023-10-23 MED ORDER — NOVOLOG FLEXPEN 100 UNIT/ML ~~LOC~~ SOPN: 5.0000 [IU] | PEN_INJECTOR | Freq: Four times a day (QID) | SUBCUTANEOUS | 3 refills | Status: DC

## 2023-10-23 MED ORDER — LANTUS SOLOSTAR 100 UNIT/ML ~~LOC~~ SOPN: 10.0000 [IU] | PEN_INJECTOR | Freq: Every day | SUBCUTANEOUS | 1 refills | Status: DC

## 2023-10-23 MED ORDER — MODAFINIL 200 MG PO TABS: ORAL_TABLET | ORAL | 0 refills | Status: DC

## 2023-10-23 NOTE — Telephone Encounter (Signed)
Sent lurazidone 120mg  and [facility]ine [facility] and [facility]

## 2023-10-23 NOTE — Telephone Encounter (Signed)
Cody Short called to say he needs to use [facility] now for his RX with Medicaid. Can we please send in his three RX to [facility]. May need to be 90 day RX and may need PA. [facility] [phone]

## 2023-10-26 ENCOUNTER — Telehealth: Payer: Self-pay | Admitting: Psychiatry

## 2023-10-26 NOTE — Telephone Encounter (Signed)
Paul called and LM at 11:11 to report that the Modafinil needs a PA before eit can be filled.

## 2023-10-30 NOTE — Telephone Encounter (Signed)
Patient notified of PA approval

## 2023-10-30 NOTE — Telephone Encounter (Signed)
 Prior Approval received with [facility] Medicaid effective 10/30/2023-10/29/2024, [id number]

## 2023-10-30 NOTE — Telephone Encounter (Signed)
 Received PA request for new year on 10/28/23, will submit to [facility]

## 2023-11-02 DIAGNOSIS — G4733 Obstructive sleep apnea (adult) (pediatric): Secondary | ICD-10-CM | POA: Diagnosis not present

## 2023-11-03 ENCOUNTER — Ambulatory Visit: Payer: Medicaid Other | Admitting: Mental Health

## 2023-11-03 DIAGNOSIS — F3110 Bipolar disorder, current episode manic without psychotic features, unspecified: Secondary | ICD-10-CM
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
## 2023-11-11 LAB — HM DIABETES EYE EXAM

## 2023-11-16 ENCOUNTER — Encounter: Payer: Self-pay | Admitting: Internal Medicine

## 2023-11-23 ENCOUNTER — Other Ambulatory Visit: Payer: Self-pay

## 2023-11-23 ENCOUNTER — Telehealth: Payer: Self-pay | Admitting: Psychiatry

## 2023-11-23 ENCOUNTER — Other Ambulatory Visit: Payer: Self-pay | Admitting: Psychiatry

## 2023-11-23 DIAGNOSIS — F3110 Bipolar disorder, current episode manic without psychotic features, unspecified: Secondary | ICD-10-CM

## 2023-11-23 MED ORDER — LURASIDONE HCL 40 MG PO TABS: 40.0000 mg | ORAL_TABLET | Freq: Every day | ORAL | 0 refills | Status: DC

## 2023-11-23 NOTE — Telephone Encounter (Signed)
Cody Short states that he has been on his job for about 3 years. He was awarded employee of the month many times.  In the last couple weeks, he began to lash back out at customers who were mean to him. He states that he is off for the next week he is using PTO. His parents think it's a mood thing and he said that he is fed up. 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 He said that over the last two weeks it's as if the customers have just lost their minds and have been trying to pick fights with him. He is taking his medications as prescribed. He said that when he goes to sleep, he stays up until about 7/8 am and then sleeping about 6/8 hours during the day. He doesn't always use his sleep apnea mask because he's too tired to put it on. He typically stays asleep the whole time unless he wakes up to use the restroom, he says it's easy to fall back asleep. He reports that he is eating too much sometimes. He said that his bad mood is mostly just at work, but he did have a bad mood when he was venting to his parents about his job because they were not listening. He says he is going to start looking for another job.   Current meds as of 11/13 -- nv 2/13  modafinil 200, Lurasidone 120 daily. Hydroxyzine 10 mg prn   Please advise.

## 2023-11-23 NOTE — Telephone Encounter (Signed)
Next appt is 12/09/22. Brylin called and said that his parents told him he can't handle customers well and has a bad mood at his job. He wants someone to call him at [phone].

## 2023-11-23 NOTE — Telephone Encounter (Signed)
Pt notified and advised to ensure he is using his CPAP every night. He expressed understanding of adding the Latuda  40 MG tablet

## 2023-11-23 NOTE — Telephone Encounter (Signed)
He needs to use his CPAP every night because not doing so he will not sleep as deeply and not have as restorative and affect.  In other words he will tend to be more tired and sleepy during the day if he does not use his CPAP that also can contribute to the mood swings because of not getting adequate restorative sleep.   However it does sound like he is probably a little manic at work.  We could temporarily add 40 mg of Latuda to his current 120 mg daily.  Once he feels like he is better he can stop the 40 mg tablet.  So in the short-term he will take the 120 mg tablet every day like usual and add a 40 mg tablet because they do not make anything stronger than 120 mg tablet.  I will send in the prescription.

## 2023-11-30 ENCOUNTER — Encounter: Payer: Self-pay | Admitting: Internal Medicine

## 2023-11-30 ENCOUNTER — Other Ambulatory Visit: Payer: Self-pay

## 2023-11-30 ENCOUNTER — Telehealth: Payer: Self-pay | Admitting: Psychiatry

## 2023-11-30 DIAGNOSIS — F902 Attention-deficit hyperactivity disorder, combined type: Secondary | ICD-10-CM

## 2023-11-30 DIAGNOSIS — G4733 Obstructive sleep apnea (adult) (pediatric): Secondary | ICD-10-CM

## 2023-11-30 DIAGNOSIS — E1165 Type 2 diabetes mellitus with hyperglycemia: Secondary | ICD-10-CM

## 2023-11-30 MED ORDER — MODAFINIL 200 MG PO TABS: ORAL_TABLET | ORAL | 0 refills | Status: DC

## 2023-11-30 NOTE — Telephone Encounter (Signed)
Pended [facility]il [facility]

## 2023-11-30 NOTE — Telephone Encounter (Signed)
Cody Short called and LM at 2:04 to report that his Provigil can only be a 30 day supply. The pharmacy/insurance won't process a 90 day supply.  Please send in a correct prescription for just 30 day supply.  [employer] [facility]

## 2023-12-01 MED ORDER — NOVOLOG FLEXPEN 100 UNIT/ML ~~LOC~~ SOPN
10.0000 [IU] | PEN_INJECTOR | Freq: Four times a day (QID) | SUBCUTANEOUS | 1 refills | Status: DC
Start: 2023-12-01 — End: 2024-03-14

## 2023-12-01 MED ORDER — LANTUS SOLOSTAR 100 UNIT/ML ~~LOC~~ SOPN: 60.0000 [IU] | PEN_INJECTOR | Freq: Every day | SUBCUTANEOUS | 1 refills | Status: DC

## 2023-12-01 MED ORDER — BD PEN NEEDLE NANO 2ND GEN 32G X 4 MM MISC: 4 refills | Status: DC

## 2023-12-08 ENCOUNTER — Ambulatory Visit (INDEPENDENT_AMBULATORY_CARE_PROVIDER_SITE_OTHER): Payer: Medicaid Other | Admitting: Mental Health

## 2023-12-08 DIAGNOSIS — F3110 Bipolar disorder, current episode manic without psychotic features, unspecified: Secondary | ICD-10-CM | POA: Diagnosis not present

## 2023-12-08 NOTE — Progress Notes (Signed)
 [facility]  Name: Cody Short Date:  12/08/23 MRN: [id number] DOB: [birth year] PCP: Corwin Levins, MD  Time spent: 53 minutes Time in: 3: 00 PM time out: 3:53 PM  Treatment:   ind. Therapy   Mental Status Exam:    Appearance:    Casual     Behavior:   Appropriate  Motor:   WNL  Speech/Language:    Clear and Coherent  Affect:   Full range   Mood:   Euthymic, anxious  Thought process:   Logical, linear, goal directed  Thought content:     WNL  Sensory/Perceptual disturbances:     none  Orientation:   x4  Attention:   Good  Concentration:   Good  Memory:   Intact  Fund of knowledge:    Consistent with age and development  Insight:     Good  Judgment:    Good  Impulse Control:   Good     Reported Symptoms:  anxiety, rumination, intermittent depressed mood, impulsivity  Risk Assessment: Danger to Self:  No Self-injurious Behavior: No Danger to Others: No Duty to Warn:no Physical Aggression / Violence:No  Access to Firearms a concern: No  Gang Involvement:No  Patient / guardian was educated about steps to take if suicide or homicide risk level increases between visits: yes While future psychiatric events cannot be accurately predicted, the patient does not currently require acute inpatient psychiatric care and does not currently meet [facility] involuntary commitment criteria.  Subjective:  Patient arrived on time for today's session.  Patient shared ongoing challenges in the relationship with his brother where he continues to have minimal contact with him due to his brothers decisions which continue to be upsetting to both. He said his brother continues to struggle with drug addiction and often makes requests of his parents for money. Patient was encouraged to recognize how he's been able to handle family stress maintaining boundaries with his brother, although it has caused some stress in a relationship between he and his mother. He  recognizes how caused significant stress on his parents specifically. 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 He continues to have some anxiety and rumination related to past girls that he's had romantic interest, some he has dated, reporting having difficulty not thinking about some of them over the years, one in particular discussed today.Reviewed cognitive distortions with a focus on emotional reasoning as this was identified in session.    Interventions:  motivational interviewing, CBT  Diagnoses:    ICD-10-CM   1. Bipolar I disorder, most recent episode (or current) manic (HCC)  F31.10        Plan: Patient to utilize coping skills as discussed, continue his medication compliance to maintain mood stability. patient will continue to work on identifying and challenging his obsessive thoughts, particularly regarding relationships, and practicing grounding techniques to manage his anxiety. He will also be encouraged to celebrate his achievements at work and continue to take ownership of his successes.  Long-term goals:   Maintain symptom reduction: The patient will report sustained reduction in symptoms of anxiety using both CBT and mindfulness interventions for 3 consecutive months progressively.  Improve emotional regulation: The patient will learn and apply CBT and mindfulness-based strategies to regulate emotions, such as mindfulness-based stress reduction and cognitive restructuring, and report an improvement in emotional regulation for at least 3 consecutive months progressively.    Short-term goal:  The patient will learn and apply CBT and mindfulness-based coping skills for managing anxiety and practice using it between sessions.       2.   The patient will CBT and mindfulness-based interventions to increase awareness of negative thought patterns. 3.   The patient will keep and maintain employment to keep financial  stress manageable       4.   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         Waldron Session, [facility]

## 2023-12-08 NOTE — Progress Notes (Unsigned)
Cody Short

## 2023-12-09 ENCOUNTER — Ambulatory Visit: Payer: Medicaid Other | Admitting: Family Medicine

## 2023-12-09 ENCOUNTER — Encounter: Payer: Self-pay | Admitting: Family Medicine

## 2023-12-09 VITALS — BP 130/77 | HR 81 | Ht 67.0 in | Wt 227.0 lb

## 2023-12-09 DIAGNOSIS — G4733 Obstructive sleep apnea (adult) (pediatric): Secondary | ICD-10-CM

## 2023-12-09 NOTE — Patient Instructions (Signed)

## 2023-12-09 NOTE — Progress Notes (Signed)
PATIENT: Cody Short DOB: Oct 10, 1976  REASON FOR VISIT: follow up HISTORY FROM: patient  Chief Complaint  Patient presents with   Obstructive Sleep Apnea    Rm 1 alone Pt is well, reports he will go to sleep for 4-5 hrs, get up and then goes back to sleep for about 6-8 hrs.  He has no new concerns with CPAP      HISTORY OF PRESENT ILLNESS:  12/09/23 ALL:  Cody Short is a 48 y.o. male here today for follow up for OSA on CPAP.  He reports doing well on therapy. He denies concerns with machine or supplies. He admits that sleep schedule fluctuates. He usually goes to bed around 8-9p when he works but may stay up until early morning when not working. He may nap for 3-4 hours a night without using therapy but usually uses his machine for at least 6-7 hours, every night.   He continues modafinil through Dr Jennelle Human, psychiatry, for EDS and ADHD. He reports it works well. He denies any trouble with daytime sleepiness. He continues lurasidone for sleep through PCP. Also on Latuda. Mood is stable.      HISTORY: (copied from Dr Dohmeier's previous note)  Cody Short is a 48 y.o. male patient who is here for revisit 12/08/2022 for  his CPAP compliance visit:   Summary: Pleasure for the second time to meet with Cody Short who prefers to go by his middle name Cody Short. He was diagnosed with obstructive sleep apnea at an AHI of 15.8/h, strongly REM sleep dependent apnea- and associated with loud snoring- after initially presenting for workup of restless legs.   His initial visit showed that the patient was excessively daytime sleepy and had high risks factors for obstructive sleep apnea which was not confirmed by a sleep test which I have quoted below.  At the time he was insured in a different way and his insurance would not cover the CPAP and before he could switch insurance companies he decided to rather pay out right over $800 for the machine as he felt  he could not wait. He is using an auto titration ResMed Airview AutoSet 10 with a setting between 7 and 15 cm water pressure and 2 cm expiratory pressure relief.  He is 100% compliant by days and 97% compliant by hours highly compliant.  Average user time is 7 hours 29 minutes each night.  His residual AHI is now 1.7/h and the 95th percentile pressure is 14.4 cm water.  There have been no high air leaks so his current interface is working well but he states it is already 65 months old.  So since he is not have insurance covering his machine but paid out right there has been no automatic refill of supplies and I will have to order new supplies for him today.  With this compliance however I have no hesitation.  He reports that he has no excessive daytime sleepiness per se but he was very fatigued in the past 2 days Epworth score is endorsed at 3 points and his fatigue severity scale was endorsed at 9 / 63 points.   REVIEW OF SYSTEMS: Out of a complete 14 system review of symptoms, the patient complains only of the following symptoms, none and all other reviewed systems are negative.  ESS: 0/24  ALLERGIES: Allergies  Allergen Reactions   Accutane [Isotretinoin]     Pancreatitis    Divalproex Sodium Other (See Comments)  unknown   Methylphenidate Hcl     Aggravate bipolar   Oxycodone Hcl     REACTION: hallucinations   Ozempic (0.25 Or 0.5 Mg-Dose) [Semaglutide(0.25 Or 0.5mg -Dos)] Nausea Only   Paroxetine     Aggravate bipolar disorder   Rosuvastatin Other (See Comments)    Myopathy -muscle cramps, elevated CK    HOME MEDICATIONS: Outpatient Medications Prior to Visit  Medication Sig Dispense Refill   Accu-Chek Softclix Lancets lancets Use as instructed to check blood sugar 4 times daily. DX:E11.65 300 each 3   Blood Glucose Monitoring Suppl (CONTOUR NEXT MONITOR) w/Device KIT Check sugar 4 times daily 1 kit 0   Blood Glucose Monitoring Suppl (FREESTYLE LITE) DEVI Use as instructed to  check sugar 4 times daily 1 each 0   empagliflozin (JARDIANCE) 25 MG TABS tablet Take 1 tablet (25 mg total) by mouth daily before breakfast. 90 tablet 3   glucose blood (ACCU-CHEK GUIDE) test strip USE AS INSTRUCTED TO CHECK BLOOD SUGAR 4 TIMES A DAY 300 strip 3   hydrOXYzine (ATARAX) 10 MG tablet Take 1 tablet (10 mg total) by mouth 3 (three) times daily as needed. 270 tablet 0   insulin aspart (NOVOLOG FLEXPEN) 100 UNIT/ML FlexPen Inject 10-30 Units into the skin in the morning, at noon, in the evening, and at bedtime. 90 mL 1   insulin glargine (LANTUS SOLOSTAR) 100 UNIT/ML Solostar Pen Inject 60 Units into the skin daily. 30 mL 1   Insulin Pen Needle (BD PEN NEEDLE NANO 2ND GEN) 32G X 4 MM MISC USE 5 TIMES A DAY WITH     INSULIN PENS 500 each 4   lurasidone (LATUDA) 40 MG TABS tablet Take 1 tablet (40 mg total) by mouth daily with breakfast. 30 tablet 0   Lurasidone HCl 120 MG TABS TAKE 1 TABLET DAILY WITH   BREAKFAST (DOSE CHANGE) 90 tablet 1   metFORMIN (GLUCOPHAGE-XR) 500 MG 24 hr tablet Take 2 tablets (1,000 mg total) by mouth 2 (two) times daily with a meal. 360 tablet 2   modafinil (PROVIGIL) 200 MG tablet Take 1 tablet each AM 30 tablet 0   valsartan (DIOVAN) 160 MG tablet Take 160 mg by mouth daily.     Adapalene-Benzoyl Peroxide 0.1-2.5 % gel SMARTSIG:1 Application Topical Every Evening (Patient not taking: Reported on 12/09/2023)     dicyclomine (BENTYL) 20 MG tablet Take 1 tablet (20 mg total) by mouth 2 (two) times daily as needed for spasms. (Patient not taking: Reported on 12/09/2023) 20 tablet 0   Docusate Sodium (COLACE PO) Take 2 tablets by mouth daily. (Patient not taking: Reported on 12/09/2023)     ezetimibe (ZETIA) 10 MG tablet TAKE 1 TABLET (10 MG TOTAL) BY MOUTH DAILY AFTER SUPPER. 90 tablet 0   famotidine (PEPCID) 40 MG tablet Take 40 mg by mouth 2 (two) times daily. (Patient not taking: Reported on 12/09/2023)     fenofibrate (TRICOR) 145 MG tablet TAKE 1 TABLET BY MOUTH  EVERY DAY (Patient not taking: Reported on 12/09/2023) 90 tablet 0   ondansetron (ZOFRAN-ODT) 4 MG disintegrating tablet Take 1 tablet (4 mg total) by mouth every 8 (eight) hours as needed. (Patient not taking: Reported on 12/09/2023) 20 tablet 0   No facility-administered medications prior to visit.    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 Recurs if cannot walk for a week.      TOBACCO ABUSE, HX OF 07/31/2009    PAST SURGICAL HISTORY: Past Surgical History:  Procedure Laterality Date   pilar cystectomy      FAMILY HISTORY: Family History  Problem Relation Age of Onset   Diabetes Mother    Breast cancer Mother        was told not hereditary   Cirrhosis Mother        fatty liver   High blood pressure Mother    High blood pressure Father    Diabetes Father    Melanoma Maternal Uncle    Colon cancer Neg Hx    Stomach cancer Neg Hx    Pancreatitis Neg Hx    Heart disease Neg Hx    Kidney disease Neg Hx    Liver disease Neg Hx     SOCIAL HISTORY: Social History   Socioeconomic History   Marital status: Single    Spouse name: Not on file   Number of children: 0   Years of education: Not on file   Highest education level: Bachelor's degree (e.g., BA, AB, BS)  Occupational History   Not on file  Tobacco Use   Smoking status: Former    Current packs/day: 0.00    Average packs/day: 2.5 packs/day for 10.0 years (25.0 ttl pk-yrs)    Types: Cigarettes    Start date: 10/27/1996    Quit date: 10/27/2006    Years since quitting: 17.1   Smokeless tobacco: Never  Vaping Use   Vaping status: Never Used  Substance and Sexual Activity   Alcohol  use: Not Currently    Comment: none at all   Drug use: No   Sexual activity: Not on file  Other Topics Concern   Not on file  Social History Narrative   Single. Not dating currently. No kids.    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   Hobbies: video games PC   Caffeine: 2 C a day   Social Drivers of Corporate investment banker Strain: High Risk (09/09/2023)   Overall Financial Resource Strain (CARDIA)    Difficulty of Paying Living Expenses: Very hard  Food Insecurity: No Food Insecurity (09/09/2023)   Hunger Vital Sign    Worried About Running Out of Food in the Last Year: Never true    Ran Out of Food in the Last Year: Never true  Transportation Needs: No Transportation Needs (09/09/2023)   PRAPARE - Administrator, Civil Service (Medical): No    Lack of Transportation (Non-Medical): No  Physical Activity: Sufficiently Active (09/09/2023)   Exercise Vital Sign    Days of Exercise per Week: 5 days    Minutes of Exercise per Session: 150+ min  Stress: No Stress Concern Present (09/09/2023)   [employer] of Occupational Health - Occupational Stress Questionnaire    Feeling of Stress : Not at all  Social Connections: Moderately Isolated (09/09/2023)   Social Connection and Isolation Panel [NHANES]    Frequency of Communication with Friends and Family: More than three times a week    Frequency of Social Gatherings with Friends and Family: More than three times a week  Attends Religious Services: Never    Active Member of Clubs or Organizations: Yes    Attends Banker Meetings: Never    Marital Status: Never married  Intimate Partner Violence: Not on file     PHYSICAL EXAM  Vitals:   12/09/23 1458  BP: 130/77  Pulse: 81  Weight: 227 lb (103 kg)  Height: 5\' 7"  (1.702 m)   Body mass index is 35.55 kg/m.  Generalized: Well developed, in no acute distress   Cardiology: normal rate and rhythm, no murmur noted Respiratory: clear to auscultation bilaterally  Neurological examination  Mentation: Alert oriented to time, place, history taking. Follows all commands speech and language fluent Cranial nerve II-XII: Pupils were equal round reactive to light. Extraocular movements were full, visual field were full  Motor: The motor testing reveals 5 over 5 strength of all 4 extremities. Good symmetric motor tone is noted throughout.  Gait and station: Gait is normal.    DIAGNOSTIC DATA (LABS, IMAGING, TESTING) - I reviewed patient records, labs, notes, testing and imaging myself where available.      No data to display           Lab Results  Component Value Date   WBC 5.9 09/04/2023   HGB 13.5 09/04/2023   HCT 40.6 09/04/2023   MCV 86.4 09/04/2023   PLT 237 09/04/2023      Component Value Date/Time   NA 138 09/04/2023 1210   K 3.9 09/04/2023 1210   CL 106 09/04/2023 1210   CO2 20 (L) 09/04/2023 1210   GLUCOSE 131 (H) 09/04/2023 1210   BUN 17 09/04/2023 1210   CREATININE 1.30 (H) 09/04/2023 1210   CREATININE 1.24 06/18/2018 1502   CALCIUM 10.3 09/04/2023 1210   PROT 7.9 09/04/2023 1210   ALBUMIN 4.8 09/04/2023 1210   AST 25 09/04/2023 1210   ALT 23 09/04/2023 1210   ALKPHOS 46 09/04/2023 1210   BILITOT 0.5 09/04/2023 1210   GFRNONAA >60 09/04/2023 1210   GFRNONAA 84 04/02/2018 1551   GFRAA 97 04/02/2018 1551   Lab Results  Component Value Date   CHOL 225 (H) 02/10/2022   HDL 37 (L) 02/10/2022   LDLCALC 145 (H) 02/10/2022   LDLDIRECT 120.0 04/02/2018   TRIG 237 (H) 02/10/2022   CHOLHDL 4.6 08/06/2020   Lab Results  Component Value Date   HGBA1C 8.2 04/27/2023   No results found for: "VITAMINB12" Lab Results  Component Value Date   TSH 1.46 08/31/2023     ASSESSMENT AND PLAN 48 y.o. year old male  has a past medical history of Acne varioliformis (07/04/2010), Anxiety, Bipolar affective disorder (HCC), Depression,  DM w/o Complication Type II (07/05/2007), High cholesterol, Hypertension, HYPERTRIGLYCERIDEMIA (10/14/2010), Nocturia (07/04/2010), PILAR CYST (08/03/2007), and TOBACCO ABUSE, HX OF (07/31/2009). here with     ICD-10-CM   1. OSA (obstructive sleep apnea)  G47.33 For home use only DME continuous positive airway pressure (CPAP)       Cody Short is doing well on CPAP therapy. Compliance report reveals excellent compliance. He was encouraged to continue using CPAP nightly and for greater than 4 hours each night. We will update supply orders as indicated. Risks of untreated sleep apnea review and education materials provided. Healthy lifestyle habits encouraged. He will follow up in 1 year, sooner if needed. He verbalizes understanding and agreement with this plan.    Orders Placed This Encounter  Procedures   For home use only DME continuous positive airway pressure (CPAP)  Heated Humidity with all supplies as needed    Length of Need:   Lifetime    Patient has OSA or probable OSA:   Yes    Is the patient currently using CPAP in the home:   Yes    Settings:   Other see comments    CPAP supplies needed:   Mask, headgear, cushions, filters, heated tubing and water chamber     No orders of the defined types were placed in this encounter.     Shawnie Dapper, FNP-C 12/09/2023, 4:12 PM [facility] [address] [place], [place] [address] [phone]

## 2023-12-10 ENCOUNTER — Telehealth (INDEPENDENT_AMBULATORY_CARE_PROVIDER_SITE_OTHER): Payer: Medicaid Other | Admitting: Psychiatry

## 2023-12-10 ENCOUNTER — Encounter: Payer: Self-pay | Admitting: Psychiatry

## 2023-12-10 DIAGNOSIS — F4001 Agoraphobia with panic disorder: Secondary | ICD-10-CM | POA: Diagnosis not present

## 2023-12-10 DIAGNOSIS — F99 Mental disorder, not otherwise specified: Secondary | ICD-10-CM

## 2023-12-10 DIAGNOSIS — F411 Generalized anxiety disorder: Secondary | ICD-10-CM | POA: Diagnosis not present

## 2023-12-10 DIAGNOSIS — F3110 Bipolar disorder, current episode manic without psychotic features, unspecified: Secondary | ICD-10-CM

## 2023-12-10 DIAGNOSIS — F5113 Hypersomnia due to other mental disorder: Secondary | ICD-10-CM

## 2023-12-10 DIAGNOSIS — G4733 Obstructive sleep apnea (adult) (pediatric): Secondary | ICD-10-CM

## 2023-12-10 DIAGNOSIS — F1211 Cannabis abuse, in remission: Secondary | ICD-10-CM

## 2023-12-10 DIAGNOSIS — F902 Attention-deficit hyperactivity disorder, combined type: Secondary | ICD-10-CM | POA: Diagnosis not present

## 2023-12-10 DIAGNOSIS — F5105 Insomnia due to other mental disorder: Secondary | ICD-10-CM

## 2023-12-10 MED ORDER — MODAFINIL 200 MG PO TABS: ORAL_TABLET | ORAL | 0 refills | Status: DC

## 2023-12-10 MED ORDER — LURASIDONE HCL 80 MG PO TABS: 160.0000 mg | ORAL_TABLET | Freq: Every day | ORAL | 0 refills | Status: DC

## 2023-12-10 NOTE — Progress Notes (Signed)
Cody Short [id number] 08/02/1976 48 y.o.   Video Visit via My Chart  I connected with pt by video using My Chart and verified that I am speaking with the correct person using two identifiers.   I discussed the limitations, risks, security and privacy concerns of performing an evaluation and management service by My Chart  and the availability of in person appointments. I also discussed with the patient that there may be a patient responsible charge related to this service. The patient expressed understanding and agreed to proceed.  I discussed the assessment and treatment plan with the patient. The patient was provided an opportunity to ask questions and all were answered. The patient agreed with the plan and demonstrated an understanding of the instructions.   The patient was advised to call back or seek an in-person evaluation if the symptoms worsen or if the condition fails to improve as anticipated.  I provided 30 minutes of video time during this encounter.  The patient was located at home and the provider was located office. Session [id number] Subjective:   Patient ID:  Cody Short is a 48 y.o. (DOB 16-Feb-1976) male.  Chief Complaint:  Chief Complaint  Patient presents with   Follow-up   Manic Behavior   ADD     Cody Short presents to the office today for follow-up of several psychiatric diagnoses ...    seen August 2020.  For obsessive anxiety  Switched Lexapro 5 mg daily on May 18.  But he stated it made him hungry and so it was stopped in August.  He continued on Saphris 10 mg nightly, lithium 1200 mg daily, propranolol as needed tremor.  Lithium level was ordered.  He called Sept saying he was manic.  Had a lithium level 0.7 and so dose was increased to 1500 mg daily.    seen December 21, 2019.  He had some ongoing manic symptoms and Saphris was increased to 15 mg nightly.  02/07/2020 appointment with the following noted: He has made  multiple phone calls to the office since that date.  He decided he wanted to stop lithium because of urinary problems.  He was warned this could lead to mania but he wanted to pursue it anyway.  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  Cody Short had been having bedwetting episodes which she attributed to the lithium.  He has not consistently taken Saphris 15 mg at bedtime since the office visit in February. He completely stopped lithium January 30, 2020 U frequency is much better and less thirsty.  Bedwetting resolved.  Reduced BM to 1-2 times daily.  Gassy.  Glucose is high.  B is hospitalized with unknown disease.  Insulin adjusted. Mental status is best it's ever been.  Less mania.  Less obsessing.  No worsening mania. Is taking Saphris 15 mg HS> Plan no med changes.  04/10/2020 appointment with the following noted: No longer on lithium.  Still frequent urination and thirst DT DM poorly controlled despite meds.  Endo says little else to take bc history of pancreatitis.  Says he goes to urinate q 15 mins. Plan: Wean Saphris in crossover to loxapine 150 mg HS over 2 weeks bc he and endocrinologist believe it's likely Saphris is cause of the inability to get his glucose to acceptable levels.   06/07/20 appt with the following noted: He's called a couple of times CO sleepiness and reduced loxapine to 50 mg HS. Started [employer]  diet and BS is better 4 weeks ago.  Thinks glucose is better off the Saphris. Lost 6 #.   CO sleepiness and sleep 16 hours   He thinks it also drove up pulse and blood pressure. Asks about taking Saphris 10 and a second medication. Mood has been fine.   Plan: He wants to restart Saphris 10 mg HS and use a 2nd mood stabilizer. Start Saphris and reduce Loxapine  To 10 mg at night DT prior failure of 10 Mg Saphris with lithium.  06/27/2020 phone call from patient: Pt called to report Loxapine & Saphris is causing his BP to be very high and  Saphris is causing blood sugar to be high. Apt 10/18. Need changes before then. Call back @ [phone]  MD response: He had wanted to go back on Saphris and reduce loxapine which we did.  Tell him to stop the Saphris and increase loxapine to 20 mg nightly.  He did not tolerate 50 mg of loxapine very well. Nurse TC withpt: Spoke with Cody Short and he will stop the Saphris, but feels the increase in the Loxapine to 20 mg will increase his blood sugars more. Explained that with coming off the Saphris he needs to increase it a bit or he will not be stable with his moods. Advised him to stop the Saphris and would check with Dr. Jennelle Human about any other recommendations.   07/17/20 appt with the following noted: Doing terrible.  Needs melatonin to sleep.  Claims loxapine is elevating BP, pulse, TG, cholesterol, glucose.  Claimed Saphris did it too.   Sees PCP at end of October.   Started Vegan diet about 6 weeks ago and exercising.   A/P: There are no available mood stabilizers that do not have a warning of elevating blood sugar and cholesterol.  He has tried all the mood stabilizers that do not have this warning.  All of the available mood stabilizers left that have any effectiveness are antipsychotics and the FDA has required a class warning on all antipsychotics about blood sugar and cholesterol effects.  This is true despite the fact that not all antipsychotics are equal at increasing these risks.  The patient's reading of these potential side effects is complicating finding an effective treatment.  The only way to prove whether or not these meds are elevating his blood sugar and cholesterol is to stop the medications for a period of time.  This exposes him to manic risk but there is no chance of finding an adequate mood stabilizer that will satisfy him until we can prove whether or not these meds are in fact affecting his blood sugar, cholesterol, blood pressure, and pulse. Therefore he will stop the loxapine which  is at a low dose. We will check labs in 3 weeks.  We will attempt follow-up in 4 weeks.  If he gets manic he will let us know.   08/13/2020 appointment with the following noted: Blood sugar is pretty much the same off the loxapine but BP and pulse improved from 110 to 70 range.  But had started BP med about mid September. Started counselor Illene Labrador at [facility] and started nutritionsist.  Has changed and restricted diet. Sees PCP 07/24/20.  Endo is managing DM.   Mood has been really good.  Counseling helped the anxiety.  Getting 10 hours of sleep.  PCP suggested changing time going to bed earlier and it's helping.   Sometimes has racing thoughts and gets comments from others while  playing video games that he needs to take a break. Patient reports stable mood and deniesr irritable moods.  Denies appetite disturbance.  Patient reports that energy and motivation have been good.  Patient denies any difficulty with concentration.  Patient denies any suicidal ideation.  Admits he's easily stressed.   10/09/20 appt with following noted: Getting manic as hell and depressed as hell. Talking too much and obsessing over things.  Talking too loud and doing things extremely fast.  Only depressed a couple of days ago.  Manic sx are brief and not all the time. Sleep 7-8 hours and sleep schedule is more normal. Stayed on Caplyta and pleased overall with SE. BS is better but hypertension is ongoing. Low TSH. Still counseling. No delusions hallucinations since off drugs. Give samples of Caplyta 42 mg once daily. Prefer no change befor ethe holidays bc he is not continuously manic or depressed. Likely switch to Jordan after the holidays.  11/19/2020 appointment with the following noted: Best I've ever been except some controllable manic.  Able to do things fast and easily.  Anxiety medicine and counseling has helped.  Better function.  No SE. Not depressed in a while.  But not sure the  Capylta helps mania.   Lost from 230# to 189# with diet and exercise over a long period.   Sleep 4-8 hours, but not that I can't sleep but don't feel like sleeping.  Not itching any more.  I'm a a whole new person.   I can't keep up the mania.  Driving my parents and friends crazy. Blood sugar better and off insulin.  Only taking metformin. Can't afford Caplyta. So feels he must change. Hydroxyzine really helped anxiety. Plan: DC Caplyta DT mania.  switch to Latuda 40 then 80 mg with food.  Pick up samples.  12/03/2020 phone call from parents:Parents called and want dr. Jennelle Human to know that Cody Short is maniac and he is having physical aggression and all he does is play video games 24 hours a day and is not sleeping. Parents feel like Cody Short is not giving you the whole picture and needs an adjustment to his medicine. MD response:atient acknowledged that his appointment on January 24 that he was manic while taking Caplyta.  He also acknowledged that his family saw him as being manic.  We discontinued Caplyta and switched him to Jordan.  If he made the switch and increase the dose in a timely manner he should have been on 80 mg daily for a week now.  His parents are complaining that he is still manic.  Provided he is tolerating the Latuda reasonably well have him increase to 120 mg daily.  He can take 1-1/2 of the 80 mg tablets until he runs out and then we can send in a refill for 120 mg daily.  Remind him he needs to take that with at least 350 cal. Phone call with patient from nurse: Patient rtc and he did report he's doing great on the medication but is still having mania. Reports staying up all night playing video games then asleep about 5 am and back up at noon. He is taking medication with 350 calories also. Advised him to increase to 120 mg Latuda daily. He reports having only 40 mg tablets at home so instructed him to take 3 tablets and I would get more samples out for him to pick up tomorrow. He agreed and  verbalized understanding.  12/07/2020 phone call: Patient called again about his Latuda. He  states that it makes him really hungry after he takes it, he takes a nap and wakes up hungry again. He would like to know would it be okay to take before bed instead. MD response: He is unlikely to comply with recommendations about routine sleep and wake times.  He is in a habit of playing video games late into the night.  If he prefers he can split the dosage of the Latuda between breakfast and another meal.  But it is very important that he get the full dose in every day and take it with 350 cal. Nursing phone call with patient:Rtc to patient and he reports he has to take it all at hs because it makes him sleepy, I advised him to continue that regimen. He also reports not sleeping well but he's been drinking caffeine later in the day. Advised him to not drink any caffeine past 2-3 pm. He agreed. He reports his blood sugars have been elevated some, not as bad but it is up some.   12/20/2020 appointment with the following noted: Mania tremendously better but occ anger outbursts anger.  6 hours of sleep with melatonin, hydroxyzine and Latuda 120 mg for 2 weeks.  Staying up all night playing video games. Kasandra Knudsen does make him hungry right after he takes it so takes it before bedtime.  Taking with food. Hyperactivity and loud talking is better.  I can function fast.  Parents see benefit with mania but are concerned about his anger outbursts but he things she takes stress out on him.  Blood sugar is generally good except when eats too much.  Taking it with dinner and HS.   Able to drive around town.  Can cook and clean.  Less ruminating on his past.  Less need for direction.   Counseling is helping at [facility] counseling. Hydroxyzine helped itching and anxiety.  Plan: Continue Latuda 120 mg PM.  Is getting benefit but only been on it for a couple of weeks.   01/17/2021 appointment with following noted: So so.   Some anger outburts a couple of times.  Eats too much.  Blood sugar is pretty bad.  Up at night playing video games and then sleeps too late in the day.  Takes Latuda at evening meal.  Wants to blame meds for these behaviors. Otherwise mood is "good I guess".  No serious highs or lows.  Can get in arguments with mother when she tries to correct him and may feel guilty afterwards. Takes hydroxyzine q 6 hours because of anxiety and bc dx thyroid inflamed, thyroiditis.  Asks if there's anything to help with overeating. Lost weight from thyroid.   Plan: Continue Latuda 120 mg as he is tolerating it and it appears to be controlling mania better than did the Caplyta and he is tolerating it better.  02/12/2021 appointment with the following noted: Perfectly good with mood and depression.  Needs hydroxyzine for itching and anxiety.  Energy poor with thyroiditis. Will sleep too much too bc gets bored and no physical activity. Varies 9-16 hours daily.  Can nap easily.  Not spacy.   No anger lately. Tolerating Latuda well.   Still eats too many sweets late at night and gets high blood sugars.   Plan: Increase  topiramate 50 mg BID off label for weight loss and disc SE.  He wants to try something.  03/20/2021 appointment with the following noted: Taking meds.  A little benefit with increase topiramate. Mood is perfectly fine without  mania nor depression. Blood sugar is a bit high. Claims doctor thinks rash could be related to [facility] but he sleeps all the time bc hypothyroidism. Has folliculitis Started treatment for it and it is helping.   Thyroid problem is a wait and see.  Could take a year to get better. Doesn't think topiramate added tiredness. Not taking hydroxyzine much about once daily. Dealing with difficult GM threatening sui if left alone.  Plan: Continue Latuda 120 mg PM.  Is getting benefit.. Better tolerated than others so far but he does say it makes him hungry but he has chronic impulse  control problems including overeating.  05/06/2021 appointment with the following noted: Quit topiramate DT nausea and not helping at all. Rash resolved. Lipids not terrible but a little up.  TSH normalized. HGBA1C 9.3 Not exercising. Not doing much re: work etc other than video games. Not manic lately. Patient reports stable mood and denies depressed or irritable moods.  Patient denies any recent difficulty with anxiety.   Denies appetite disturbance.  Patient reports that energy and motivation have been good.  Patient denies any difficulty with concentration.  Patient denies any suicidal ideation. Energy is better than it was but still sleeping 12 hours daily and up at night playing videos with friends. Plan: Reduce Latuda to 80 mg to reduce excessive sleepiness.  If mania then increase it back up 120 mg PM.  Is getting benefit..  06/20/21 TC:  Pt stated the cramps have spread to his arms and legs.He has been on latuda 80 mg qd for awhile now.He stated he is also going to see a cardiologist due to heart spasms. MD response:  The cramps are not likely related to Jordan.  Usually it is related to dehydration although I see from chart review that he had elevated creatinine kinase from a statin and that was probably causing some of his problem.  He needs to make sure he drinks lots of water.  Especially because he is diabetic and that will make him prone to dehydration if he is not controlling his blood sugar. However the sleepiness might be related to Jordan.  He had previously been on 120 mg daily and we reduced it to 80 mg daily and if he is done okay with his mood we can try reducing it 1 more time to 60 mg.  I will send in a prescription for the 60 mg tablet and he can reduce it if he would like to see if the sleepiness is related to Jordan.  If Kasandra Knudsen is contributing to sleepiness it will get better if he reduces the dose.  If the sleepiness does not get better with the dosage reduction then Latuda  is not the cause Reduce Latuda to 60 mg daily  07/30/2021 appt noted: Not much difference in alertness Mania since reducing Latuda. Hypoactive bc thryroid problems and may be the cause of the cramps per the other doctors. Itches badly without hydroxyzine. Spent $500 on gambling game and adeleted.  Past history of drug induced gambling but no major drugs in 15 years. Still exhausted and sleep all day.  But is taying up at night.  Sleeping 16 hours daily.  Wonders if hydroxyzine 25 is making him tired. Itching worse with stress and stress of GM and B crazy. Asks about bipolar shot. Side sleeper and as far as he knows no excessive snoring.  08/23/2021 phone call from patient's father stating that Cody Short had "gone off the rails" and it spent $1000  on the night prior doing online gambling and had gone back to staying up all night and sleeping during the day.  He had also been more verbally abusive.  It was suggested that the Latuda dose be increased from 60 to 80 mg in the evening.  10/09/2021 appointment with the following noted: Sleeping a lot and easily exhausted even after med were changed.   SE a little constipation. History of problems with gambling in the past but not current.  Not thinking of it now. No other manic sx currently.   On Latuda 80 daily now.  Reduced hydroxyzine to 10 and still sleepy.  No other psych meds Plan: Continue Latuda 80 mg bc manic with less .  If recurrence of mania then increase it back up 120 mg PM.  Is getting benefit..  11/18/2021 phone call: Patient had complained of elevated CK levels and wondered if it could be related to Latuda.  Dr. Jennelle Human called Dr. Duanne Guess and discussed this issue.  It appears that his CK levels have actually been declining over the last several months and are not at a critical level.  It was agreed that this could be monitored over time and no immediate med change was necessary.  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   1 episode when got angy and said awful things to mother but then apologized.  Only 1 bad day. Otherwise mood has been pretty stable lately. Constipation and can't take miralax bc gives him diarrhea.   Sleep normalized to 8 hours since started his job.  So the med was not the source of the sleepiness. Counselor next Tuesday Only hydroxyzine is 10 mg HS Only other psych med is Latuda 80 mg daily Plan: Latuda  Been the best med so far for him.     Sleepiness was not better with reduction in Latuda from 120 to 60 mg daily. Continue Latuda 80 mg bc manic with less .  Option split dose to help sleepiness .   03/12/22 appt noted: Taking hydroxyzine BID Sleep study next week. 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  At [employer]. Doing well emotionally. Constipation and GI doc tomorrow DT fissure. BS is better A1C 7.4 instead of 9+ Sleep 8 hours.   The only thing he doesn't like is 4 meals daily bc hard to lose weight. Not much mania nor depression. Still anxious and worse without hydroxyzine. Consistent with Latuda at bedtime with food.   Rash bettter .  Itching managed with BID hydroxyzine. Plan: Continue Latuda 80 mg bc manic with less .  Option split dose to help sleepiness .  If recurrence of mania then increase it back up 120 mg PM.  Is getting benefit..  06/23/22 appt noted: Rough 2 weeks.  Larey Seat for 46 yo girl at work and was somewhat manic with her and she quit the job.  But is beginning to feel better now. Dx OSA CPAP and tremendous difference.  Not sleeping as much and more productive at work.  But still some initial sleepiness in the AM Not constipation or diarrhea but slow to move bowels but  takes several times in the AM. Started Colace  2 daily for the last couple of days. Lost 10-15# and now stagnant. Working and eating less and weights off and on 3 weeks and trouble losing weight. At work asking too many questions and is too anxious.  Wonders about more hydroxyzine. Sober for 12 years. Can't split Latuda bc gets too sleepy and takes it too much at night. Had same job 6 mos.  Got employee of the month. Plan: no changes  07/08/22 tC CO more manic sx 07/30/22 TC: Pt stated generic latuda is not working for him.He is having manic symptoms such as cleaning his whole house,getting into arguments with his mom and getting agitated very easily.He also gambled $300.He is taking 80 mg  MD response : incr Latuda to 120 mg daily  09/25/22 appt noted: It made all the difference in the world with incr Latuda to 120 mg daily.  No sig mania or depression now. Increased stool softeners to 3 twice daily.  BS better with Jardiance and wt loss 19#. Tolerating Latuda.   No delusions or visions like before and no trouble some intrusive thoughts. Rash is better with clindamycin solution. Busy with work.  [employer] employee of the month twice at [employer].  37 hours per month. Sleep 8-9 hours. Hydroxyzine 10 HS helps sleep and can't take in daytime. SE can't lose wt on Latuda with medical help. Plan no med changes  02/04/23 TC: Patient is taking hydroxyzine and Latuda. He says he takes as prescribed, taking with enough calories. There was a work situation that caused a lot of stress and he was able to talk to someone to calm him down and provide clarity on the situation. He said that he spent $1000 on a gambling game that didn't even provide any pay out. He also said he got into an argument with 2 different customers and that he doesn't usually do this. He is also describing depression. He said he is sleeping 9 hours a day. Patient does not have pressured or rapid speech, seems to have a calm  affect, but is concerned.     MD resp:  I'm out of town.  Only option I can give now is to increase the Latuda to 1 and 1/2 of the 120 mg tabs temporarily until mania resolves     02/04/23 appt noted: in person Increased Latuda to 180 mg daily since ehre bc manic sx. Hydroxyzine 10 HS hangover so taking 10 HS Now also Lautda 120 mg daily. Doing good overall.  No sig mania lately. Few days mild dep but not bad.   Some constipation issues.  Takign stool softeners.   Therapy with Elio Forget [facility] is helping a lot. Wonders if Jordan speeds up sex response.  No compulsive sex behaviors. Lost $7000 on gambling game.  Got banned from game and no other gambling problems now.   B drug addict and causes problems and asking for money and lying.   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   Sleep 8 hours.   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 Accutane several mos.  Rash 90% better. Constipation better with stool softeners. Not much mood swings but dep occ.  7-8 hours sleep. Uses hydroxyzine for anxiety bc it remains high.  Too tired if more than 20 mg at a time. Meds Latuda 120 mg daily and hydroxyzine 10 BID.   Might have to go on [facility]  bc income reasons. Therapy with Elio Forget helped more than any other therapist.   Covid shut down Dad's business. B returned to drug use.  He's neglecting his kid.  B is big stress.   GM died 4 mos ago.   Can be impulsive and prone to addiction. Plan: ADD tx is reasonable bc job affected by difficulty with handling two tasks at once.  Forgetful and can't get back on task.  Work restrictions bc of it.  No regular stimulant. Ok trial modafinil off label for this problem.  Also to help OSA. Modafinil 100-then 200 mg AM.  09/09/23 appt noted: Modafinil is the best thing that ever happened to me.  Better focus, alertness, and function.   ADD "completely fixed".  More productive and boss noticed.   No SE.   Flare accutane causing pancreatitis.  Went to ER.  Scared of it and that it might be fatal.   Constipation resolved.   Lost 30# in 6 mos. CPAP is better and sleep better.  Only 6-7 hours of sleep and was 8-9 hours.   Change to MCD 09/27/23.   Changing only PCP and keeping other doctors.   Better with wt loss and glucose now normal.  Dieting and exercise.  Losing more wt.   Psych meds: modafinil 200, Lurasidone 120 daily. Hydroxyzine 10 mg prn,   12/10/23 appt noted: Med: Latuda 160 mg daily, modafinil 200 mg AM Raised Latuda to 160 mg daily. Dealing with family and customers a "million" times better.  Mood overall is better with work and family.  My whole life is better with increase Latuda.  Less lashing out at people. B is a ongoing problem at home. "Cognitive distortions".   Working on those. SE some wt gain.   Stay up late if no work.  If works then 8 hour sleep.  Will nap if sleep deprived.  Using CPAP more.  Had FU yesterday and doing well with. Still loves the modafinil for focus and alertness.   B using crack and threatens them if not giving him money.   Prior psychiatric medications: risperidone with cognitive side effects, Abilify NR,  Zyprexa SE glucose, Seroquel 600 mg SE wt, perphenazineSE, Saphris 20 partial response but SE increase glucose , haloperidol EPS, Geodon EPS,  Vraylar, loxapine SE, Caplyta NR,  Latuda 160 good resp He's prone to EPS.  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 lithium 1500,   N-acetylcysteine with minimal benefit, ,  Modafinil 200 helped a  lot Amantadine with vomiting.  Sertraline and lexapro sexual SE,   Propranolol for tremor,   Ozempic GI px   Review of Systems:  Review of Systems  Constitutional:  Positive for fatigue.  Gastrointestinal:  Positive for  constipation and rectal pain.  Endocrine: Positive for polydipsia and polyuria.  Genitourinary:  Negative for decreased urine volume and frequency.  Musculoskeletal:  Positive for myalgias.  Skin:  Positive for rash.       Itching resolved with hydroxyzine  Neurological:  Negative for tremors.  Psychiatric/Behavioral:  Positive for decreased concentration. Negative for agitation, behavioral problems, confusion, dysphoric mood, hallucinations, self-injury, sleep disturbance and suicidal ideas. The patient is hyperactive.     Medications: I have reviewed the patient's current medications.  Current Outpatient Medications  Medication Sig Dispense Refill   Accu-Chek Softclix Lancets lancets Use as instructed to check blood sugar 4 times daily. DX:E11.65 300  each 3   Blood Glucose Monitoring Suppl (CONTOUR NEXT MONITOR) w/Device KIT Check sugar 4 times daily 1 kit 0   Blood Glucose Monitoring Suppl (FREESTYLE LITE) DEVI Use as instructed to check sugar 4 times daily 1 each 0   empagliflozin (JARDIANCE) 25 MG TABS tablet Take 1 tablet (25 mg total) by mouth daily before breakfast. 90 tablet 3   glucose blood (ACCU-CHEK GUIDE) test strip USE AS INSTRUCTED TO CHECK BLOOD SUGAR 4 TIMES A DAY 300 strip 3   hydrOXYzine (ATARAX) 10 MG tablet Take 1 tablet (10 mg total) by mouth 3 (three) times daily as needed. 270 tablet 0   insulin aspart (NOVOLOG FLEXPEN) 100 UNIT/ML FlexPen Inject 10-30 Units into the skin in the morning, at noon, in the evening, and at bedtime. 90 mL 1   insulin glargine (LANTUS SOLOSTAR) 100 UNIT/ML Solostar Pen Inject 60 Units into the skin daily. 30 mL 1   Insulin Pen Needle (BD PEN NEEDLE NANO 2ND GEN) 32G X 4 MM MISC USE 5 TIMES A DAY WITH     INSULIN PENS 500 each 4   lurasidone (LATUDA) 80 MG TABS tablet Take 2 tablets (160 mg total) by mouth daily with breakfast. 180 tablet 0   metFORMIN (GLUCOPHAGE-XR) 500 MG 24 hr tablet Take 2 tablets (1,000 mg total) by mouth 2 (two) times  daily with a meal. 360 tablet 2   modafinil (PROVIGIL) 200 MG tablet Take 1 tablet each AM 90 tablet 0   valsartan (DIOVAN) 160 MG tablet Take 160 mg by mouth daily.     No current facility-administered medications for this visit.    Medication Side Effects: Other: less tremor  Allergies:  Allergies  Allergen Reactions   Accutane [Isotretinoin]     Pancreatitis    Divalproex Sodium Other (See Comments)    unknown   Methylphenidate Hcl     Aggravate bipolar   Oxycodone Hcl     REACTION: hallucinations   Ozempic (0.25 Or 0.5 Mg-Dose) [Semaglutide(0.25 Or 0.5mg -Dos)] Nausea Only   Paroxetine     Aggravate bipolar disorder   Rosuvastatin Other (See Comments)    Myopathy -muscle cramps, elevated CK    Past Medical History:  Diagnosis Date   Acne varioliformis 07/04/2010   Anxiety    Bipolar affective disorder (HCC)    Depression    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 Recurs if cannot walk for a week.      TOBACCO ABUSE, HX OF 07/31/2009    Family History  Problem Relation Age of Onset   Diabetes Mother    Breast cancer Mother        was told not hereditary   Cirrhosis Mother        fatty liver   High blood pressure Mother    High blood pressure Father    Diabetes Father    Melanoma Maternal Uncle    Colon cancer Neg Hx    Stomach cancer Neg Hx    Pancreatitis Neg Hx    Heart disease Neg Hx    Kidney disease Neg Hx    Liver disease Neg Hx     Social History   Socioeconomic History   Marital status: Single    Spouse name: Not on file   Number of children: 0  Years of education: Not on file   Highest education level: Bachelor's degree (e.g., BA, AB, BS)  Occupational History   Not on file  Tobacco Use   Smoking status: Former    Current packs/day: 0.00    Average packs/day:  2.5 packs/day for 10.0 years (25.0 ttl pk-yrs)    Types: Cigarettes    Start date: 10/27/1996    Quit date: 10/27/2006    Years since quitting: 17.1   Smokeless tobacco: Never  Vaping Use   Vaping status: Never Used  Substance and Sexual Activity   Alcohol use: Not Currently    Comment: none at all   Drug use: No   Sexual activity: Not on file  Other Topics Concern   Not on file  Social History Narrative   Single. Not dating currently. No kids.    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   Hobbies: video games PC   Caffeine: 2 C a day   Social Drivers of Corporate investment banker Strain: High Risk (09/09/2023)   Overall Financial Resource Strain (CARDIA)    Difficulty of Paying Living Expenses: Very hard  Food Insecurity: No Food Insecurity (09/09/2023)   Hunger Vital Sign    Worried About Running Out of Food in the Last Year: Never true    Ran Out of Food in the Last Year: Never true  Transportation Needs: No Transportation Needs (09/09/2023)   PRAPARE - Administrator, Civil Service (Medical): No    Lack of Transportation (Non-Medical): No  Physical Activity: Sufficiently Active (09/09/2023)   Exercise Vital Sign    Days of Exercise per Week: 5 days    Minutes of Exercise per Session: 150+ min  Stress: No Stress Concern Present (09/09/2023)   [employer] of Occupational Health - Occupational Stress Questionnaire    Feeling of Stress : Not at all  Social Connections: Moderately Isolated (09/09/2023)   Social Connection and Isolation Panel [NHANES]    Frequency of Communication with Friends and Family: More than three times a week    Frequency of Social Gatherings with Friends and Family: More than three times a week    Attends Religious Services: Never    Database administrator or Organizations: Yes    Attends Banker Meetings: Never    Marital Status: Never married   Catering manager Violence: Not on file    Past Medical History, Surgical history, Social history, and Family history were reviewed and updated as appropriate.   Please see review of systems for further details on the patient's review from today.   Objective:   Physical Exam:  There were no vitals taken for this visit.  Physical Exam Neurological:     Mental Status: He is alert and oriented to person, place, and time.     Cranial Nerves: No dysarthria.  Psychiatric:        Attention and Perception: Attention and perception normal.        Mood and Affect: Mood is anxious. Mood is not depressed.        Speech: Speech normal.        Behavior: Behavior is cooperative.        Thought Content: Thought content normal. Thought content is not paranoid or delusional. Thought content does not include homicidal or suicidal ideation. Thought content does not include suicidal plan.  Cognition and Memory: Cognition and memory normal.        Judgment: Judgment normal.     Comments: Insight intact Mildly intense style chronically. No AIM noted     Lab Review:     Component Value Date/Time   NA 138 09/04/2023 1210   K 3.9 09/04/2023 1210   CL 106 09/04/2023 1210   CO2 20 (L) 09/04/2023 1210   GLUCOSE 131 (H) 09/04/2023 1210   BUN 17 09/04/2023 1210   CREATININE 1.30 (H) 09/04/2023 1210   CREATININE 1.24 06/18/2018 1502   CALCIUM 10.3 09/04/2023 1210   PROT 7.9 09/04/2023 1210   ALBUMIN 4.8 09/04/2023 1210   AST 25 09/04/2023 1210   ALT 23 09/04/2023 1210   ALKPHOS 46 09/04/2023 1210   BILITOT 0.5 09/04/2023 1210   GFRNONAA >60 09/04/2023 1210   GFRNONAA 84 04/02/2018 1551   GFRAA 97 04/02/2018 1551       Component Value Date/Time   WBC 5.9 09/04/2023 1210   RBC 4.70 09/04/2023 1210   HGB 13.5 09/04/2023 1210   HCT 40.6 09/04/2023 1210   PLT 237 09/04/2023 1210   MCV 86.4 09/04/2023 1210   MCH 28.7 09/04/2023 1210   MCHC 33.3 09/04/2023 1210   RDW 13.5 09/04/2023  1210   LYMPHSABS 1.7 09/04/2023 1210   MONOABS 0.4 09/04/2023 1210   EOSABS 0.1 09/04/2023 1210   BASOSABS 0.0 09/04/2023 1210    Lithium Lvl  Date Value Ref Range Status  09/09/2019 1.1 0.6 - 1.2 mmol/L Final    12/14/19 lithium level 0.6 (trough 13 hours) , normal BMP and calcium   No results found for: "PHENYTOIN", "PHENOBARB", "VALPROATE", "CBMZ"   .res Assessment: Plan:    Jacier "Cody Short" was seen today for follow-up, manic behavior and add.  Diagnoses and all orders for this visit:  Bipolar I disorder, most recent episode (or current) manic (HCC) -     lurasidone (LATUDA) 80 MG TABS tablet; Take 2 tablets (160 mg total) by mouth daily with breakfast.  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  It has been evident in chronic occupational dysfunction and some relational problems.  He is also been very prone to EPS and elevated blood sugars from atypicals making it difficult to get adequate mood stabilization.   Has a history of severe pancreatitis from Depakote.  He did not have an adequate response to carbamazepine.   lithium was insufficient to control his symptoms in monotherapy and he developed diabetes insipidus.  There are no available mood stabilizers that do not have a warning of elevating blood sugar and cholesterol.  He has tried all the mood stabilizers that do not have this warning.  All of the available mood stabilizers left that have any effectiveness are antipsychotics and the FDA has  required a class warning on all antipsychotics about blood sugar and cholesterol effects.  This is true despite the fact that not all antipsychotics are equal and increasing these risks.  The patient's reading of these  potential side effects is complicating finding an effective treatment.  The only way to prove whether or not these meds are elevating his blood sugar and cholesterol is to stop the medications for a period of time.  This exposes him to manic risk but there is no chance of finding an adequate mood stabilizer that will satisfy him until we can prove whether or not these meds are in fact affecting his blood sugar, cholesterol, blood pressure, and pulse.  Latuda  Been the best med so far for him.    Answered questions with no other good options obvious. Sleepiness was not better with reduction in Latuda from 120 to 60 mg daily. Manic overactivity, reactivity is better with increase Latuda.  He has a low stress tolerance.  He can be easily agitated in public. Continue Latuda 160 mg PM.  Is getting more benefit.. .   Discussed potential metabolic side effects associated with atypical antipsychotics, as well as potential risk for movement side effects. Advised pt to contact office if movement side effects occur.   continue Hydroxyzine 10 mg HS prn  This has been helpful for anxiety and itching and may have some mild antimanic effect.  Consider modafinil if necessary for function.  Disc risk of mania.  Disc SE.     OK to continue with Elio Forget for therapy  He has continued abstinence from marijuana is encouraged.  He has a history of cannabinoid hyperemesis which finally convinced him to discontinue the marijuana.  His mood disorder and thought disorganization have improved since being off the marijuana.  He has a very long history of heavy marijuana dependence.   Constipation management effective  Sleep hygiene. Disc OSA and how it affects alertness and started CPAP and much better.     ADD tx is reasonable bc job affected by difficulty with handling two tasks at once.  Forgetful and can't get back on task.  Work restrictions bc of it.  No regular stimulant. Ok trial modafinil off label for this problem.  Also to help OSA.  Much less likley to trigger psychosis than traditional stimulants. Continue DT marked benefit: Modafinil 200 mg AM.  No med changes  FU 5 mos  Meredith Staggers, MD, DFAPA  Please see After Visit Summary for patient specific instructions.  Future Appointments  Date Time Provider Department Center  12/29/2023  3:20 PM Carlus Pavlov, MD [facility] None  01/05/2024  3:00 PM Waldron Session, [facility] None  02/11/2024  3:00 PM Waldron Session, [facility] None  03/09/2024  2:00 PM Corwin Levins, MD [facility] None  05/02/2024  3:00 PM Terri Piedra, DO [facility] None  12/15/2024  3:00 PM Lomax, Amy, NP [facility] None    No orders of the defined types were placed in this encounter.      -------------------------------

## 2023-12-15 DIAGNOSIS — G4733 Obstructive sleep apnea (adult) (pediatric): Secondary | ICD-10-CM | POA: Diagnosis not present

## 2023-12-29 ENCOUNTER — Encounter: Payer: Self-pay | Admitting: Internal Medicine

## 2023-12-29 ENCOUNTER — Telehealth: Payer: Self-pay | Admitting: Psychiatry

## 2023-12-29 ENCOUNTER — Ambulatory Visit (INDEPENDENT_AMBULATORY_CARE_PROVIDER_SITE_OTHER): Payer: 59 | Admitting: Internal Medicine

## 2023-12-29 VITALS — BP 120/80 | HR 83 | Ht 67.0 in | Wt 227.4 lb

## 2023-12-29 DIAGNOSIS — E782 Mixed hyperlipidemia: Secondary | ICD-10-CM | POA: Diagnosis not present

## 2023-12-29 DIAGNOSIS — E061 Subacute thyroiditis: Secondary | ICD-10-CM | POA: Diagnosis not present

## 2023-12-29 DIAGNOSIS — Z794 Long term (current) use of insulin: Secondary | ICD-10-CM

## 2023-12-29 DIAGNOSIS — E1165 Type 2 diabetes mellitus with hyperglycemia: Secondary | ICD-10-CM

## 2023-12-29 DIAGNOSIS — Z7984 Long term (current) use of oral hypoglycemic drugs: Secondary | ICD-10-CM

## 2023-12-29 DIAGNOSIS — E66812 Obesity, class 2: Secondary | ICD-10-CM

## 2023-12-29 LAB — POCT GLYCOSYLATED HEMOGLOBIN (HGB A1C): Hemoglobin A1C: 7.2 % — AB (ref 4.0–5.6)

## 2023-12-29 MED ORDER — EMPAGLIFLOZIN 25 MG PO TABS: ORAL_TABLET | ORAL | Status: DC

## 2023-12-29 MED ORDER — LANTUS SOLOSTAR 100 UNIT/ML ~~LOC~~ SOPN: 40.0000 [IU] | PEN_INJECTOR | Freq: Every day | SUBCUTANEOUS | Status: DC

## 2023-12-29 NOTE — Progress Notes (Signed)
 Patient ID: Cody Short, male   DOB: [birth year], 48 y.o.   MRN: [id number]  HPI: Cody Short is a 48 y.o.-year-old male, presenting for follow-up for DM2, dx in ~2010, insulin-dependent, uncontrolled, without long term complications. Last visit 4 months ago. He will change insurance to [facility] next year.  Interim history: He does have increased urination, very frequent throughout the day. He was in the emergency room 09/04/2023 with abdominal pain.  Lipase was elevated, at 115. He had pancreatitis related to Accutane - now off. Since last visit, his psychotropic medications were adjusted again.  He also restarted to drink black current syrup.  Sugars increased and he also gained weight.  Approximately 2 weeks ago, he restarted his diet again and stopped syrup.  Sugars improved.  He was able to back off his NovoLog.  DM2: Patient's diabetes is directly correlated with his bipolar disease treatment: In the past he had frequent urination resolved after stopping lithium.  While transitioning off lithium, his Saphris dose was increased the blood sugars started to increase afterwards.    He was then taken off Saphris and started on Loxitane.  This was making him sleepier, but was not increasing his blood sugars.  In fact, sugars started to improve and he was able to reduce his insulin doses.   She had to stop Loxitane 2/2 increased BP and pulse.  In summer 2021, he switched to Caplyta >> sugars improved significantly on this.  In fact, he tells me that he was able to come off insulin completely while on this.  However, this did not work for him -was having anger outbursts on it.   He started on Latuda >> feeling better on this. However, on this, he was eating more >> sugars increased.  Also, he gained weight. He continues on this.  He was on an appetite suppressant (Topamax). This was causing nausea >> stopped.   Reviewed HbA1c levels: Lab Results  Component Value Date    HGBA1C 7.2 08/31/2023   HGBA1C 8.2 04/27/2023   HGBA1C 7.3 (A) 11/24/2022   HGBA1C 7.4 (A) 07/15/2022   HGBA1C 7.4 (A) 03/06/2022   HGBA1C 9.1 (A) 10/10/2021   HGBA1C 7.4 (A) 05/14/2021   HGBA1C 7.3 (A) 01/18/2021   HGBA1C 6.9 (A) 09/14/2020   HGBA1C 8.6 (A) 05/10/2020   HGBA1C 7.9 (A) 02/14/2020   HGBA1C 7.7 (A) 06/14/2019   HGBA1C 6.8 (A) 11/29/2018   HGBA1C 8.1 (A) 04/02/2018   HGBA1C 7.4 12/03/2017   HGBA1C 7.1 09/01/2017   HGBA1C 7.1 09/01/2017   HGBA1C 7.5 06/01/2017   HGBA1C 8.9 02/10/2017   HGBA1C 8.7 11/11/2016  05/07/2021: HbA1c 9.3% 12/22/2018: HbA1c 7.2%  Pt is on a regimen of: - Metformin ER 2000 mg at dinnertime - Jardiance 10 >> 25 mg daily in am - Novolog before meals: 35-45 >> 5-20 >> 5-15 >> (15-)20 units 3x a day before meals >> now 5-15 >> 35-40 >> 15-25 units for a higher carb meal - Lantus 60-75 >> 60 >> Semglee/ Lantus 45 >> Levemir 15 >> 30-35 units at bedtime >> 10-15 units daily >> Lantus 30-60 units daily He could not tolerate Ozempic >> nausea, AP We had to stop Jardiance due to increased urination. He was previously on Lantus but stopped since sugars improved. He tried Glipizide >> hypoglycemia in the 40s repeatedly. Lowest: 25. Also, Glipizide 2.5 mg in am >> nausea, vomiting, constipation We tried Invokana >> bothersome urination (when he was working) >> had to stop. He had  pancreatitis 2/2 Depakote and HTG in the past. He stopped Actos b/c stomach pain. >>  We stopped Cycloset >> inefficient, could not use b/c price.  Pt checks his sugars 4 times a day per review of his log (tried CGM - Dexcom - bothersome alarms, false lows): - am: 108-144, 157, 175 >> 122-218 >> 83-130, 152 >> 82-144, 197 - 2h after b'fast: 100-166, 244 >> 101 >> 162, 173, 325 >> n/c  - lunch: 175-260, 343 >> 67, 85-120 >> 85-143, 154 >> n/c  - 2h after lunch: 110-202, 290 >> 143, 144 >> n/c  - before dinner: 94-153, 244 >> 96 >> 79-155, 186 >> 95-133, 188 - 2h after  dinner: 89-174, 232 >> n/c >> 117, 171 >> 103, 126 - bedtime: 91-232 >> 115-252, 263, 281 >> 93-197 >> 108-153, 189 - nighttime: see above >> 134 >> 83, 95 >> n/c Lowest sugar was 69... >> 80 >> 96 >> 76 >> 88; he has hypoglycemia awareness in the 70s. Highest sugar was 354 ...>> 340 >> 197 .  Glucometer: FreeStyle Lite (he likes this)  No history of CKD, last BUN/creatinine:  Lab Results  Component Value Date   BUN 17 09/04/2023   CREATININE 1.30 (H) 09/04/2023  09/10/2022: 20/1.3, GFR 69.5, glucose 127  He had MAU in the past, improved at last check: Lab Results  Component Value Date   MICRALBCREAT 3.5 08/31/2023   MICRALBCREAT 35.2 (H) 05/10/2020   MICRALBCREAT 26.7 04/02/2018   MICRALBCREAT 3.7 02/10/2017   MICRALBCREAT 27.3 12/17/2015  12/22/2018: 262 On Diovan.  He has mixed hyperlipidemia: Reportedly lipid panel normalized in 09/2023 and he was taken off fenofibrate and Zetia at that time. 07/20/2023:  09/10/2022: 173/199/40/93 06/23/2022: 188/347/41/78 Lab Results  Component Value Date   CHOL 225 (H) 02/10/2022   HDL 37 (L) 02/10/2022   LDLCALC 145 (H) 02/10/2022   LDLDIRECT 120.0 04/02/2018   TRIG 237 (H) 02/10/2022   CHOLHDL 4.6 08/06/2020  05/03/2020: 225/345/38/126 11/23/2019: 109/148/34/49 12/22/2018: 220/359/41/145 Stopped fenofibrate 160 and Zetia 09/2023. He tried Repatha >> PA not approved.  Rosuvastatin caused leg cramps, reportedly elevated CK (900s).  Most recent CK was 562.  LFT's:   - last eye exam was in 10/2023: No DR. Decreased vision - depth. He was given a Rx for glasses.  -+ Numbness but no tingling in his feet. Last foot exam 04/27/2023.  He developed chest tightness 05/2021 and saw Dr. Jacinto Halim.  He had CAC score of 0 (07/11/2021). He has a history of scrotal rash-improved on Nystatin + Triamcinolone.  He may need refills in the future. He has recurrent groin furunculosis. 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  Subclinical  thyrotoxicosis: TSH levels were reviewed: Lab Results  Component Value Date   TSH 1.46 08/31/2023   TSH 1.11 10/10/2021   TSH <0.01 (L) 01/18/2021   TSH <0.01 (L) 12/11/2020   TSH 0.02 (L) 09/14/2020   TSH 4.03 12/17/2015   TSH 2.08 01/03/2014   TSH 5.15 11/20/2011   TSH 2.15 07/04/2010   TSH 3.82 12/12/2009   TSH 1.81 10/29/2007   FREET4 0.83 08/31/2023   FREET4 0.76 10/10/2021   FREET4 1.13 01/18/2021   FREET4 1.56 12/11/2020   FREET4 1.41 09/14/2020   T3FREE 3.7 08/31/2023   T3FREE 3.7 10/10/2021   T3FREE 3.5 01/18/2021   T3FREE 4.1 12/11/2020   T3FREE 3.9 09/14/2020   06/23/2022: TSH 1.13 05/07/2021: TSH 1.21 08/06/2020: TSH 0.079 05/03/2020: TSH 0.528 12/22/2018: 3.648  We checked a thyroid  uptake and scan (01/02/2021) and this was consistent with thyroiditis: The thyroid scan is unremarkable. No hot or cold thyroid nodules are identified. 4 hour I-123 uptake = 2.7% (normal 5-20%) 24 hour I-123 uptake = 5.4% (normal 10-30%)   IMPRESSION: Low 4 hour and 24 hour or I 123 uptake may suggest thyroiditis.   Pt denies: - feeling nodules in neck - hoarseness - dysphagia - choking  He had problems with muscle cramps/abdominal pain and lipase was found to be elevated again, at 67 (11-51) in 04/2021.  At that time, he also had transaminitis and an increase CK at 562 (49-347).   A lipase was again found to be elevated on 09/04/2023, at 115. He sees dermatology (Dr. Melida Quitter) and started Accutane for acne rash caused by Latuda.  His triglycerides increased significantly afterwards.  To improve these, he started to work on diet and exercise-per his scale at home he lost approximately 15 pounds in the last month. He then  stopped the Accutane 2/2 AP + back pain.  ROS:+ see HPI  I reviewed pt's medications, allergies, PMH, social hx, family hx, and changes were documented in the history of present illness. Otherwise, unchanged from my initial visit note.  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 Recurs if cannot walk for a week.      TOBACCO ABUSE, HX OF 07/31/2009   Past Surgical History:  Procedure Laterality Date   pilar cystectomy     History   Social History   Marital Status: Single    Spouse Name: N/A   Number of Children: 0   Occupational History      Social History Main Topics   Smoking status: Former Smoker - quit in 2010   Smokeless tobacco: Never Used   Alcohol Use: No     Comment: none at all   Drug Use: No   Current Outpatient Medications  Medication Sig Dispense Refill   Accu-Chek Softclix Lancets lancets Use as instructed to check blood sugar 4 times daily. DX:E11.65 300 each 3   Blood Glucose Monitoring Suppl (CONTOUR NEXT MONITOR) w/Device KIT Check sugar 4 times daily 1 kit 0   Blood Glucose Monitoring Suppl (FREESTYLE LITE) DEVI Use as instructed to check sugar 4 times daily 1 each 0   empagliflozin (JARDIANCE) 25 MG TABS tablet Take 1 tablet (25 mg total) by mouth daily before breakfast. 90 tablet 3   glucose blood (ACCU-CHEK GUIDE) test strip USE AS INSTRUCTED TO CHECK BLOOD SUGAR 4 TIMES A DAY 300 strip 3   hydrOXYzine (ATARAX) 10 MG tablet Take 1 tablet (10 mg total) by mouth 3 (three) times daily as needed. 270 tablet 0   insulin aspart (NOVOLOG FLEXPEN) 100 UNIT/ML FlexPen Inject 10-30 Units into the skin in the morning, at noon, in the evening, and at bedtime. 90 mL 1   insulin glargine (LANTUS SOLOSTAR) 100 UNIT/ML Solostar Pen Inject 60 Units into the skin daily. 30 mL 1   Insulin Pen Needle (BD PEN NEEDLE NANO 2ND GEN) 32G X 4 MM MISC USE 5 TIMES A DAY WITH     INSULIN PENS 500 each 4  lurasidone (LATUDA) 80 MG  TABS tablet Take 2 tablets (160 mg total) by mouth daily with breakfast. 180 tablet 0   metFORMIN (GLUCOPHAGE-XR) 500 MG 24 hr tablet Take 2 tablets (1,000 mg total) by mouth 2 (two) times daily with a meal. 360 tablet 2   modafinil (PROVIGIL) 200 MG tablet Take 1 tablet each AM 90 tablet 0   valsartan (DIOVAN) 160 MG tablet Take 160 mg by mouth daily.     No current facility-administered medications for this visit.    No current facility-administered medications on file prior to visit.    Allergies  Allergen Reactions   Accutane [Isotretinoin]     Pancreatitis    Divalproex Sodium Other (See Comments)    unknown   Methylphenidate Hcl     Aggravate bipolar   Oxycodone Hcl     REACTION: hallucinations   Ozempic (0.25 Or 0.5 Mg-Dose) [Semaglutide(0.25 Or 0.5mg -Dos)] Nausea Only   Paroxetine     Aggravate bipolar disorder   Rosuvastatin Other (See Comments)    Myopathy -muscle cramps, elevated CK   Family History  Problem Relation Age of Onset   Diabetes Mother    Breast cancer Mother        was told not hereditary   Cirrhosis Mother        fatty liver   High blood pressure Mother    High blood pressure Father    Diabetes Father    Melanoma Maternal Uncle    Colon cancer Neg Hx    Stomach cancer Neg Hx    Pancreatitis Neg Hx    Heart disease Neg Hx    Kidney disease Neg Hx    Liver disease Neg Hx    PE: BP 120/80   Pulse 83   Ht 5\' 7"  (1.702 m)   Wt 227 lb 6.4 oz (103.1 kg)   SpO2 97%   BMI 35.62 kg/m   Wt Readings from Last 3 Encounters:  12/29/23 227 lb 6.4 oz (103.1 kg)  12/09/23 227 lb (103 kg)  09/10/23 217 lb (98.4 kg)   Constitutional: overweight, in NAD Eyes: EOMI, no exophthalmos ENT:no thyromegaly, no cervical lymphadenopathy Cardiovascular: RRR, No MRG Respiratory: CTA B Musculoskeletal: no deformities Skin:no rashes Neurological: no tremor with outstretched hands  ASSESSMENT: 1. DM2, insulin-dependent, uncontrolled, without long term  complications, but with hyperglycemia  His test were negative for type 1 diabetes: Component     Latest Ref Rng 05/28/2015  C-Peptide     0.80 - 3.90 ng/mL 2.88  Glucose, Fasting     65 - 99 mg/dL 540 (H)  Glutamic Acid Decarb Ab     <5 IU/mL <5  Pancreatic Islet Cell Antibody     < 5 JDF Units <5   - His diabetes is difficult to manage because of multiple intolerances: - We tried to add Invokana but he did not tolerated due to increased urination.  - We cannot use a DPP 4 inhibitor or a GLP-1 receptor agonist due to his history of pancreatitis.  - He had to stop Actos >>  abd. Pain - we stopped Cycloset >> expensive and not effective - we tried Glipizide >> GI sxs: N/V/D - he tried the Dexcom G6 CGM but he felt that this was giving him a lot of errors and stopped  2. HTG  3.  Obesity class 2  4.  Subacute thyroiditis  PLAN:  1. Patient with history of uncontrolled type 2 diabetes, on oral antidiabetic regimen with metformin and  SGLT2 inhibitor along with basal/bolus insulin regimen with improvement in control after starting Jardiance.  We cannot use it GLP-1 receptor agonist for him due to history of pancreatitis.  He actually did try Ozempic but had abdominal pain and nausea and had to stop.  At last visit, he returned after starting to improve diet.  He started to do so after having had high triglycerides again along with abdominal pain (although this was from Accutane).  He stopped the Accutane and started to improve diet and exercise and lost 8 pounds.  He was concerned about increased urination and we discussed that if this continues, he can reduce the dose of Jardiance to half.  He did reduce the Jardiance in half with tolerable urination now. -HbA1c at last visit was better, at 7.2%. -At today's visit, he returns after 2 weeks of improving diet in which sugars started to improve, also, without significant hyperglycemic peaks.  No lows.  He was using a much higher dose of NovoLog  but he started to reduce this after he started to improve his diet.  He is also on a half maximal dose of Jardiance, as mentioned above.  He is varying the dose of Lantus a little too much.  I advised him to try to stay with a more stable dose.  Otherwise, we will continue the rest of the regimen. - I advised him to: Patient Instructions  Please continue: - Metformin ER 2000 mg at dinnertime - Jardiance 12.5 mg daily in am - Novolog before meals: 15-25 units only before higher carb meals - Lantus 40 units at bedtime  Please return in 3-4 months with your sugar log.   - we checked his HbA1c: 7.2% (stable) - advised to check sugars at different times of the day - 1-3x a day, rotating check times - advised for yearly eye exams >> he is UTD - return to clinic in 3-4 months  2. HL -With predominant hypertriglyceridemia -lipid panel was reviewed: From 04/2023 very high triglycerides and LDL slightly above target while on Accutane:  -He was on fenofibrate 160 mg daily and Zetia 10 mg daily but these were stopped in 09/2023 as his lipid panel normalized after stopping Accutane.  I do not have these records. -He previously had muscle cramps and elevated CK but cramps have resolved.  3.  Obesity class 2 -He gained a significant amount of weight on Latuda.  He needs to get a snack with 350 calories at bedtime when he takes Jordan. -We cannot use Ozempic due to history of pancreatitis and GI symptoms when he actually tried it.  In fact, more recent lipase level was elevated 09/04/2023, at 115. -He continues on Jardiance which should also help with weight loss - we decreased the dose in half due to significantly increased urination.  Will continue with this dose. -He continues to use a CPAP for OSA.  He feels that this and modafinil made a great difference in his energy levels. -He lost 8 pounds before last visit, however, he gained 13 pounds afterwards, over the holidays and 2/2 psych med changes but  also reports drinking black current syrup again.  He stopped in the last 2 weeks.  Weight stabilized now.  4.  Subacute thyroiditis -Diagnosed by uptake and scan - Latest TFTs were normal at last visit - No signs or symptoms of thyrotoxicosis  Carlus Pavlov, MD PhD [facility]

## 2023-12-29 NOTE — Telephone Encounter (Signed)
 PAs in [facility] for Provigil and modafinil. Pt said a PA is needed, which it seems that every time he gets a RF he is told this. Called [facility] and was told that insurance prefers brand.

## 2023-12-29 NOTE — Telephone Encounter (Signed)
 Paul called and LM at 4:08 stating that pharmacy says the modafinil needs a PA before they can feel the prescriptions.  Next appt 7/14

## 2023-12-29 NOTE — Patient Instructions (Addendum)
 Please continue: - Metformin ER 2000 mg at dinnertime - Jardiance 12.5 mg daily in am - Novolog before meals: 15-25 units only before higher carb meals - Lantus 40 units at bedtime  Please return in 3-4 months with your sugar log.

## 2023-12-30 ENCOUNTER — Other Ambulatory Visit: Payer: Self-pay

## 2023-12-30 DIAGNOSIS — F902 Attention-deficit hyperactivity disorder, combined type: Secondary | ICD-10-CM

## 2023-12-30 DIAGNOSIS — G4733 Obstructive sleep apnea (adult) (pediatric): Secondary | ICD-10-CM

## 2023-12-30 MED ORDER — MODAFINIL 200 MG PO TABS: ORAL_TABLET | ORAL | 0 refills | Status: DC

## 2023-12-30 NOTE — Telephone Encounter (Signed)
 LVM to [facility]

## 2023-12-30 NOTE — Telephone Encounter (Signed)
 Called pharmacy again and was told that they are not enrolled in state Medicaid program and cannot fill the Rx. Will need to send to another pharmacy. Patient notified.

## 2023-12-30 NOTE — Telephone Encounter (Signed)
 Clarification on pt's Provigil 200 mg, the wrong pharmacy was selected [facility] instead of [facility].   Pended for Dr. Jennelle Human to resubmit to correct pharmacy

## 2024-01-01 NOTE — Addendum Note (Signed)
 Addended by: Pollie Meyer on: 01/01/2024 09:49 AM   Modules accepted: Orders

## 2024-01-02 ENCOUNTER — Other Ambulatory Visit: Payer: Self-pay | Admitting: Psychiatry

## 2024-01-02 DIAGNOSIS — F3110 Bipolar disorder, current episode manic without psychotic features, unspecified: Secondary | ICD-10-CM

## 2024-01-05 ENCOUNTER — Ambulatory Visit: Payer: Medicaid Other | Admitting: Mental Health

## 2024-01-05 DIAGNOSIS — F3110 Bipolar disorder, current episode manic without psychotic features, unspecified: Secondary | ICD-10-CM | POA: Diagnosis not present
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
## 2024-01-12 ENCOUNTER — Telehealth: Payer: Self-pay | Admitting: Psychiatry

## 2024-01-12 DIAGNOSIS — F3110 Bipolar disorder, current episode manic without psychotic features, unspecified: Secondary | ICD-10-CM

## 2024-01-12 NOTE — Telephone Encounter (Signed)
 Pt lvm stating that his latuda 80 mg needs to be sent as a quantity of 60 with [facility]ills [facility]. This is the only way they will fill the script

## 2024-01-14 ENCOUNTER — Encounter: Payer: Self-pay | Admitting: Internal Medicine

## 2024-01-14 NOTE — Telephone Encounter (Signed)
 Called [facility] and there are plan limitations and they will only fill 30 days now. The last RF sent was for 90 days so they shipped a 30-day supply. Asked that they ship another 30-days now and then patient will still have a 30-day supply left. He was notified of this.

## 2024-01-15 MED ORDER — INSULIN PEN NEEDLE 32G X 6 MM MISC: 3 refills | Status: DC

## 2024-02-04 ENCOUNTER — Other Ambulatory Visit: Payer: Self-pay | Admitting: Psychiatry

## 2024-02-04 DIAGNOSIS — F902 Attention-deficit hyperactivity disorder, combined type: Secondary | ICD-10-CM

## 2024-02-04 DIAGNOSIS — G4733 Obstructive sleep apnea (adult) (pediatric): Secondary | ICD-10-CM

## 2024-02-04 DIAGNOSIS — L299 Pruritus, unspecified: Secondary | ICD-10-CM

## 2024-02-04 NOTE — Telephone Encounter (Signed)
 How is he taking hydroxyzine, note says QHS

## 2024-02-06 ENCOUNTER — Other Ambulatory Visit: Payer: Self-pay | Admitting: Psychiatry

## 2024-02-06 DIAGNOSIS — F3110 Bipolar disorder, current episode manic without psychotic features, unspecified: Secondary | ICD-10-CM

## 2024-02-08 ENCOUNTER — Telehealth: Payer: Self-pay | Admitting: Psychiatry

## 2024-02-08 NOTE — Telephone Encounter (Signed)
 Pt LVM @ 9:07a that Modafinil needs a PA per [facility].  Next appt 7/14

## 2024-02-08 NOTE — Telephone Encounter (Signed)
 Patient has a PA for brand. [facility] does not participate in state Medicaid. Rx needs to be sent to local pharmacy. Patient notified.

## 2024-02-09 ENCOUNTER — Other Ambulatory Visit: Payer: Self-pay

## 2024-02-09 DIAGNOSIS — G4733 Obstructive sleep apnea (adult) (pediatric): Secondary | ICD-10-CM

## 2024-02-09 DIAGNOSIS — F902 Attention-deficit hyperactivity disorder, combined type: Secondary | ICD-10-CM

## 2024-02-09 MED ORDER — PROVIGIL 200 MG PO TABS: ORAL_TABLET | ORAL | 2 refills | Status: DC

## 2024-02-09 NOTE — Telephone Encounter (Signed)
 Pt asked to send in a 30-day RF to [facility] on [address]. Pended.

## 2024-02-09 NOTE — Telephone Encounter (Signed)
 Pt Lvm @ 6:59p stating that [facility] did not get the Modafinil script.

## 2024-02-10 ENCOUNTER — Telehealth: Payer: Self-pay

## 2024-02-11 ENCOUNTER — Ambulatory Visit (INDEPENDENT_AMBULATORY_CARE_PROVIDER_SITE_OTHER): Payer: Medicaid Other | Admitting: Mental Health

## 2024-02-11 DIAGNOSIS — F3110 Bipolar disorder, current episode manic without psychotic features, unspecified: Secondary | ICD-10-CM

## 2024-02-11 DIAGNOSIS — G4733 Obstructive sleep apnea (adult) (pediatric): Secondary | ICD-10-CM | POA: Diagnosis not present

## 2024-02-11 NOTE — Progress Notes (Signed)
 [facility]  Name: Cody Short Date:  02/11/24 MRN: [id number] DOB: Sep 30, 1976 PCP: Roslyn Coombe, MD  Time spent: 52 minutes Time in: 3: 00 PM time out: 3:52 PM  Treatment:   ind. Therapy  Virtual Visit via Telehealth Note Connected with patient by a telemedicine/telehealth application, with their informed consent, and verified patient privacy and that I am speaking with the correct person using two identifiers. I discussed the limitations, risks, security and privacy concerns of performing psychotherapy and the availability of in person appointments. I also discussed with the patient that there may be a patient responsible charge related to this service. The patient expressed understanding and agreed to proceed. I discussed the treatment planning with the patient. The patient was provided an opportunity to ask questions and all were answered. The patient agreed with the plan and demonstrated an understanding of the instructions. The patient was advised to call  our office if  symptoms worsen or feel they are in a crisis state and need immediate contact.   Therapist Location: office Patient Location: home     Mental Status Exam:    Appearance:    Casual     Behavior:   Appropriate  Motor:   WNL  Speech/Language:    Clear and Coherent  Affect:   Full range   Mood:   Euthymic, anxious  Thought process:   Logical, linear, goal directed  Thought content:     WNL  Sensory/Perceptual disturbances:     none  Orientation:   x4  Attention:   Good  Concentration:   Good  Memory:   Intact  Fund of knowledge:    Consistent with age and development  Insight:     Good  Judgment:    Good  Impulse Control:   Good     Reported Symptoms:  anxiety, rumination, intermittent depressed mood, impulsivity  Risk Assessment: Danger to Self:  No Self-injurious Behavior: No Danger to Others: No Duty to Warn:no Physical Aggression / Violence:No  Access to Firearms a  concern: No  Gang Involvement:No  Patient / guardian was educated about steps to take if suicide or homicide risk level increases between visits: yes While future psychiatric events cannot be accurately predicted, the patient does not currently require acute inpatient psychiatric care and does not currently meet [place]  involuntary commitment criteria.  Subjective:  Patient engaged in telehealth visit via video. He stated his mother called the police last night due to his brother coming to their house again and making demands.  As a way to care for himself and maintain lower stress levels, he is trying to avoid discussing it with his parents in detail as he went on to share how this causes him to feel more anxious and upset. He feels his medication to treat his anxiety and it has been helpful with stress at work and home. He stated he has been able to cope with customer at work.    He was able to list some ways has been helpful at work, getting less frustrated with customers, being more effective at certain aspects of his job to have a high performance.  He identified the need to continue to work on how he manages his stress related to customers at work at times, states he has vented to some fellow employees collectively but feels this may not be the best way to handle his stress as he wants to eventually advance in position and status at the company.  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  He stated he feels he has made some progress with negative thought patterns toward himself particularly about past relationships.  Time spent to allow him to further give examples and share how he wants to manage this going forward such as occupying his mind when going to bed as  this is a time where he tends to ruminate.   Interventions:  motivational interviewing, CBT  Diagnoses:    ICD-10-CM   1. Bipolar I disorder, most recent episode (or current) manic (HCC)  F31.10          Plan: Patient to utilize coping skills as discussed, continue his medication compliance to maintain mood stability. patient will continue to work on identifying and challenging his obsessive thoughts, particularly regarding relationships, and practicing grounding techniques to manage his anxiety. He will also be encouraged to celebrate his achievements at work and continue to take ownership of his successes.  Long-term goals:   Maintain symptom reduction: The patient will report sustained reduction in symptoms of anxiety using both CBT and mindfulness interventions for 3 consecutive months progressively.  Improve emotional regulation: The patient will learn and apply CBT and mindfulness-based strategies to regulate emotions, such as mindfulness-based stress reduction and cognitive restructuring, and report an improvement in emotional regulation for at least 3 consecutive months progressively.    Short-term goal:  The patient will learn and apply CBT and mindfulness-based coping skills for managing anxiety and practice using it between sessions.       2.   The patient will CBT and mindfulness-based interventions to increase awareness of negative thought patterns. 3.   The patient will keep and maintain employment to keep financial stress manageable       4.   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         Avram Lenis, [facility] why cannot talk get out of the client violence against a just 70 that message about him I think maybe we does way is the big decision and we may need to do it but did you check on the

## 2024-02-18 ENCOUNTER — Other Ambulatory Visit: Payer: Self-pay | Admitting: Internal Medicine

## 2024-02-19 ENCOUNTER — Encounter: Payer: Self-pay | Admitting: Internal Medicine

## 2024-02-19 ENCOUNTER — Other Ambulatory Visit: Payer: Self-pay

## 2024-02-19 MED ORDER — VALSARTAN 160 MG PO TABS: 160.0000 mg | ORAL_TABLET | Freq: Every day | ORAL | 3 refills | Status: AC

## 2024-02-19 NOTE — Telephone Encounter (Signed)
 Cody Short

## 2024-03-01 ENCOUNTER — Encounter: Payer: Self-pay | Admitting: Internal Medicine

## 2024-03-01 MED ORDER — INSULIN PEN NEEDLE 32G X 4 MM MISC: 3 refills | Status: DC

## 2024-03-03 ENCOUNTER — Other Ambulatory Visit: Payer: Self-pay | Admitting: Internal Medicine

## 2024-03-03 ENCOUNTER — Ambulatory Visit (INDEPENDENT_AMBULATORY_CARE_PROVIDER_SITE_OTHER): Admitting: Internal Medicine

## 2024-03-03 ENCOUNTER — Encounter: Payer: Self-pay | Admitting: Internal Medicine

## 2024-03-03 VITALS — BP 120/80 | HR 81 | Temp 98.2°F | Ht 67.0 in | Wt 233.0 lb

## 2024-03-03 DIAGNOSIS — Z1159 Encounter for screening for other viral diseases: Secondary | ICD-10-CM | POA: Diagnosis not present

## 2024-03-03 DIAGNOSIS — N320 Bladder-neck obstruction: Secondary | ICD-10-CM | POA: Diagnosis not present

## 2024-03-03 DIAGNOSIS — E559 Vitamin D deficiency, unspecified: Secondary | ICD-10-CM | POA: Diagnosis not present

## 2024-03-03 DIAGNOSIS — E1165 Type 2 diabetes mellitus with hyperglycemia: Secondary | ICD-10-CM | POA: Diagnosis not present

## 2024-03-03 DIAGNOSIS — Z7984 Long term (current) use of oral hypoglycemic drugs: Secondary | ICD-10-CM | POA: Diagnosis not present

## 2024-03-03 DIAGNOSIS — J4520 Mild intermittent asthma, uncomplicated: Secondary | ICD-10-CM | POA: Diagnosis not present

## 2024-03-03 DIAGNOSIS — Z0001 Encounter for general adult medical examination with abnormal findings: Secondary | ICD-10-CM | POA: Diagnosis not present

## 2024-03-03 DIAGNOSIS — I10 Essential (primary) hypertension: Secondary | ICD-10-CM | POA: Diagnosis not present

## 2024-03-03 DIAGNOSIS — E538 Deficiency of other specified B group vitamins: Secondary | ICD-10-CM

## 2024-03-03 DIAGNOSIS — E781 Pure hyperglyceridemia: Secondary | ICD-10-CM | POA: Diagnosis not present

## 2024-03-03 LAB — LIPID PANEL
Cholesterol: 328 mg/dL — ABNORMAL HIGH (ref 0–200)
HDL: 43.1 mg/dL (ref >39.00)
NonHDL: 285.02
Total CHOL/HDL Ratio: 8
Triglycerides: 701 mg/dL — ABNORMAL HIGH (ref 0.0–149.0)
VLDL: 140.2 mg/dL — ABNORMAL HIGH (ref 0.0–40.0)

## 2024-03-03 LAB — BASIC METABOLIC PANEL WITH GFR
BUN: 14 mg/dL (ref 6–23)
CO2: 22 meq/L (ref 19–32)
Calcium: 9.6 mg/dL (ref 8.4–10.5)
Chloride: 105 meq/L (ref 96–112)
Creatinine, Ser: 1.1 mg/dL (ref 0.40–1.50)
GFR: 81.09 mL/min (ref >60.00)
Glucose, Bld: 104 mg/dL — ABNORMAL HIGH (ref 70–99)
Potassium: 4.3 meq/L (ref 3.5–5.1)
Sodium: 139 meq/L (ref 135–145)

## 2024-03-03 LAB — CBC WITH DIFFERENTIAL/PLATELET
Basophils Absolute: 0.1 10*3/uL (ref 0.0–0.1)
Basophils Relative: 0.6 % (ref 0.0–3.0)
Eosinophils Absolute: 0.1 10*3/uL (ref 0.0–0.7)
Eosinophils Relative: 1.2 % (ref 0.0–5.0)
HCT: 43.2 % (ref 39.0–52.0)
Hemoglobin: 14.5 g/dL (ref 13.0–17.0)
Lymphocytes Relative: 20.5 % (ref 12.0–46.0)
Lymphs Abs: 1.8 10*3/uL (ref 0.7–4.0)
MCHC: 33.6 g/dL (ref 30.0–36.0)
MCV: 85.7 fl (ref 78.0–100.0)
Monocytes Absolute: 0.7 10*3/uL (ref 0.1–1.0)
Monocytes Relative: 8.3 % (ref 3.0–12.0)
Neutro Abs: 6 10*3/uL (ref 1.4–7.7)
Neutrophils Relative %: 69.4 % (ref 43.0–77.0)
Platelets: 221 10*3/uL (ref 150.0–400.0)
RBC: 5.05 Mil/uL (ref 4.22–5.81)
RDW: 14.1 % (ref 11.5–15.5)
WBC: 8.6 10*3/uL (ref 4.0–10.5)

## 2024-03-03 LAB — HEPATIC FUNCTION PANEL
ALT: 25 U/L (ref 0–53)
AST: 26 U/L (ref 0–37)
Albumin: 4.9 g/dL (ref 3.5–5.2)
Alkaline Phosphatase: 75 U/L (ref 39–117)
Bilirubin, Direct: 0.1 mg/dL (ref 0.0–0.3)
Total Bilirubin: 0.3 mg/dL (ref 0.2–1.2)
Total Protein: 7.8 g/dL (ref 6.0–8.3)

## 2024-03-03 LAB — URINALYSIS, ROUTINE W REFLEX MICROSCOPIC
Bilirubin Urine: NEGATIVE
Hgb urine dipstick: NEGATIVE
Ketones, ur: NEGATIVE
Leukocytes,Ua: NEGATIVE
Nitrite: NEGATIVE
RBC / HPF: NONE SEEN (ref 0–?)
Specific Gravity, Urine: 1.015 (ref 1.000–1.030)
Total Protein, Urine: NEGATIVE
Urine Glucose: >=1000 — AB
Urobilinogen, UA: 0.2 (ref 0.0–1.0)
WBC, UA: NONE SEEN (ref 0–?)
pH: 6 (ref 5.0–8.0)

## 2024-03-03 LAB — VITAMIN B12: Vitamin B-12: 499 pg/mL (ref 211–911)

## 2024-03-03 LAB — LDL CHOLESTEROL, DIRECT: Direct LDL: 171 mg/dL

## 2024-03-03 LAB — HEMOGLOBIN A1C: Hgb A1c MFr Bld: 7.9 % — ABNORMAL HIGH (ref 4.6–6.5)

## 2024-03-03 LAB — TSH: TSH: 0.79 u[IU]/mL (ref 0.35–5.50)

## 2024-03-03 LAB — PSA: PSA: 0.52 ng/mL (ref 0.10–4.00)

## 2024-03-03 LAB — VITAMIN D 25 HYDROXY (VIT D DEFICIENCY, FRACTURES): VITD: 8.8 ng/mL — ABNORMAL LOW (ref 30.00–100.00)

## 2024-03-03 MED ORDER — FENOFIBRATE 160 MG PO TABS: 160.0000 mg | ORAL_TABLET | Freq: Every day | ORAL | 3 refills | Status: DC

## 2024-03-03 NOTE — Progress Notes (Signed)
 Patient ID: Cody Short, male   DOB: September 04, 1976, 48 y.o.   MRN: [id number]         Chief Complaint:: wellness exam and dyslipidemia, asthma, dm, htn       HPI:  Cody Short is a 48 y.o. male here for wellness exam; declines colonoscopy; for hep c screen, for tdap at pharmacy, o/w up to date                        Also brother has drug addiction, always asking for money .  Left his job due to stress but Denies worsening depressive symptoms, suicidal ideation, or panic; has ongoing anxiety.  Has f/u with derm for acne in 2 weeks. Pt denies chest pain, increased sob or doe, wheezing, orthopnea, PND, increased LE swelling, palpitations, dizziness or syncope.   Pt denies polydipsia, polyuria, or new focal neuro s/s.    Pt denies fever, wt loss, night sweats, loss of appetite, or other constitutional symptoms     Wt Readings from Last 3 Encounters:  03/03/24 233 lb (105.7 kg)  12/29/23 227 lb 6.4 oz (103.1 kg)  12/09/23 227 lb (103 kg)   BP Readings from Last 3 Encounters:  03/03/24 120/80  12/29/23 120/80  12/09/23 130/77   Immunization History  Administered Date(s) Administered   HPV 9-valent 06/26/2022   Hepatitis A, Adult 06/26/2022   Influenza Inj Mdck Quad Pf 06/26/2022   Influenza Split 11/27/2011, 07/19/2012   Influenza Whole 07/29/2008, 07/26/2009, 07/22/2010   Influenza, Seasonal, Injecte, Preservative Fre 09/10/2023   Influenza,inj,Quad PF,6+ Mos 08/15/2013, 06/27/2015, 08/11/2016, 09/01/2017, 06/18/2018   Influenza-Unspecified 08/31/2014   Pneumococcal Conjugate-13 08/31/2014   Pneumococcal Polysaccharide-23 12/24/2015   Td 07/22/2010   Health Maintenance Due  Topic Date Due   Hepatitis C Screening  Never done   DTaP/Tdap/Td (2 - Tdap) 07/22/2020   HPV VACCINES (2 - 3-dose SCDM series) 07/24/2022   Colonoscopy  Never done      Past Medical History:  Diagnosis Date   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 Recurs if cannot walk for a week.      TOBACCO ABUSE, HX OF 07/31/2009   Past Surgical History:  Procedure Laterality Date   pilar cystectomy      reports that he quit smoking about 17 years ago. His smoking use included cigarettes. He started smoking about 27 years ago. He has a 25 pack-year smoking history. He has never used smokeless tobacco. He reports that he does not currently use alcohol. He reports that he does not use drugs. family history includes Breast cancer in his mother; Cirrhosis in his mother; Diabetes in his father and mother; High blood pressure in his father and mother; Melanoma in his maternal uncle. Allergies  Allergen Reactions   Accutane [Isotretinoin]     Pancreatitis    Divalproex Sodium Other (See Comments)    unknown   Methylphenidate Hcl     Aggravate bipolar   Oxycodone Hcl     REACTION: hallucinations   Ozempic  (0.25 Or 0.5 Mg-Dose) [Semaglutide (0.25 Or 0.5mg -Dos)] Nausea Only   Paroxetine     Aggravate bipolar disorder   Rosuvastatin  Other (  See Comments)    Myopathy -muscle cramps, elevated CK   Current Outpatient Medications on File Prior to Visit  Medication Sig Dispense Refill   Accu-Chek Softclix Lancets lancets Use as instructed to check blood sugar 4 times daily. DX:E11.65 300 each 3   Blood Glucose Monitoring Suppl (CONTOUR NEXT MONITOR) w/Device KIT Check sugar 4 times daily 1 kit 0   Blood Glucose Monitoring Suppl (FREESTYLE LITE) DEVI Use as instructed to check sugar 4 times daily 1 each 0   empagliflozin  (JARDIANCE ) 25 MG TABS tablet Take by mouth 12.5-25 mg daily in am     glucose blood (ACCU-CHEK GUIDE) test strip USE AS INSTRUCTED TO CHECK BLOOD SUGAR 4 TIMES A DAY 300 strip 3   hydrOXYzine  (ATARAX ) 10 MG  tablet Take 1 tablet (10 mg total) by mouth 3 (three) times daily as needed (itching or insomnia). 90 tablet 0   insulin  aspart (NOVOLOG  FLEXPEN) 100 UNIT/ML FlexPen Inject 10-30 Units into the skin in the morning, at noon, in the evening, and at bedtime. 90 mL 1   insulin  glargine (LANTUS  SOLOSTAR) 100 UNIT/ML Solostar Pen INJECT SUBCUTANEOUSLY 60 UNITS  DAILY 60 mL 2   Insulin  Pen Needle 32G X 4 MM MISC Use 5x a day 500 each 3   Insulin  Pen Needle 32G X 6 MM MISC Use 5 times a day 500 each 3   lurasidone  (LATUDA ) 80 MG TABS tablet TAKE 2 TABLETS BY MOUTH DAILY  WITH BREAKFAST 60 tablet 3   metFORMIN  (GLUCOPHAGE -XR) 500 MG 24 hr tablet Take 2 tablets (1,000 mg total) by mouth 2 (two) times daily with a meal. 360 tablet 2   PROVIGIL  200 MG tablet TAKE 1 TABLET BY MOUTH EACH  MORNING 30 tablet 2   valsartan  (DIOVAN ) 160 MG tablet Take 1 tablet (160 mg total) by mouth daily. 90 tablet 3   No current facility-administered medications on file prior to visit.        ROS:  All others reviewed and negative.  Objective        PE:  BP 120/80 (BP Location: Right Arm, Patient Position: Sitting, Cuff Size: Normal)   Pulse 81   Temp 98.2 F (36.8 C) (Oral)   Ht 5\' 7"  (1.702 m)   Wt 233 lb (105.7 kg)   SpO2 98%   BMI 36.49 kg/m                 Constitutional: Pt appears in NAD               HENT: Head: NCAT.                Right Ear: External ear normal.                 Left Ear: External ear normal.                Eyes: . Pupils are equal, round, and reactive to light. Conjunctivae and EOM are normal               Nose: without d/c or deformity               Neck: Neck supple. Gross normal ROM               Cardiovascular: Normal rate and regular rhythm.                 Pulmonary/Chest: Effort normal and breath sounds without rales or wheezing.  Abd:  Soft, NT, ND, + BS, no organomegaly               Neurological: Pt is alert. At baseline orientation, motor grossly intact                Skin: Skin is warm. No rashes, no other new lesions, LE edema - none               Psychiatric: Pt behavior is normal without agitation   Micro: none  Cardiac tracings I have personally interpreted today:  none  Pertinent Radiological findings (summarize): none   Lab Results  Component Value Date   WBC 8.6 03/03/2024   HGB 14.5 03/03/2024   HCT 43.2 03/03/2024   PLT 221.0 03/03/2024   GLUCOSE 104 (H) 03/03/2024   CHOL 328 (H) 03/03/2024   TRIG (H) 03/03/2024    701.0 Triglyceride is over 400; calculations on Lipids are invalid.   HDL 43.10 03/03/2024   LDLDIRECT 171.0 03/03/2024   LDLCALC 145 (H) 02/10/2022   ALT 25 03/03/2024   AST 26 03/03/2024   NA 139 03/03/2024   K 4.3 03/03/2024   CL 105 03/03/2024   CREATININE 1.10 03/03/2024   BUN 14 03/03/2024   CO2 22 03/03/2024   TSH 0.79 03/03/2024   PSA 0.52 03/03/2024   HGBA1C 7.9 (H) 03/03/2024   MICROALBUR 3.2 (H) 08/31/2023   Assessment/Plan:  Lossie Jurgensen Terris is a 48 y.o. White or Caucasian [1] male with  has a past medical history of Acne varioliformis (07/04/2010), Anxiety, Bipolar affective disorder (HCC), Depression, DM w/o Complication Type II (07/05/2007), High cholesterol, Hypertension, HYPERTRIGLYCERIDEMIA (10/14/2010), Nocturia (07/04/2010), PILAR CYST (08/03/2007), and TOBACCO ABUSE, HX OF (07/31/2009).  Encounter for well adult exam with abnormal findings Age and sex appropriate education and counseling updated with regular exercise and diet Referrals for preventative services - declines colonoscopy, for hep c screen Immunizations addressed - for tdap pharmacy Smoking counseling  - none needed Evidence for depression or other mood disorder - stable with good med compliance and f/u psychiatry soon Most recent labs reviewed. I have personally reviewed and have noted: 1) the patient's medical and social history 2) The patient's current medications and supplements 3) The patient's height, weight,  and BMI have been recorded in the chart   HYPERTRIGLYCERIDEMIA Lab Results  Component Value Date   CHOL 328 (H) 03/03/2024   CHOL 225 (H) 02/10/2022   CHOL 232 (H) 07/31/2021   Lab Results  Component Value Date   HDL 43.10 03/03/2024   HDL 37 (L) 02/10/2022   HDL 38 (L) 07/31/2021   Lab Results  Component Value Date   LDLCALC 145 (H) 02/10/2022   LDLCALC 136 (H) 07/31/2021   LDLCALC 110 (H) 08/06/2020   Lab Results  Component Value Date   TRIG (H) 03/03/2024    701.0 Triglyceride is over 400; calculations on Lipids are invalid.   TRIG 237 (H) 02/10/2022   TRIG 319 (H) 07/31/2021   Lab Results  Component Value Date   CHOLHDL 8 03/03/2024   CHOLHDL 4.6 08/06/2020   CHOLHDL 6 04/02/2018   Lab Results  Component Value Date   LDLDIRECT 171.0 03/03/2024   LDLDIRECT 120.0 04/02/2018   LDLDIRECT 113.0 02/10/2017  Uncontrolled, now for add fenofibrate  160 mg qd  Asthma Stable overall, cont inhaler prn  Type 2 diabetes mellitus with hyperglycemia, without long-term current use of insulin  (HCC) Lab Results  Component Value Date   HGBA1C 7.9 (H) 03/03/2024  uncontrolled, pt to continue current medical treatment jardianc 25 every day, novolog  SSI, lantus  every day, metformin  ER 50 mg - 2 tab bid   Essential hypertension BP Readings from Last 3 Encounters:  03/03/24 120/80  12/29/23 120/80  12/09/23 130/77   Stable, pt to continue medical treatment diovan  160 mg qd   Vitamin D deficiency Last vitamin D Lab Results  Component Value Date   VD25OH 8.80 (L) 03/03/2024   Low, to start oral replacement  Followup: Return in about 6 months (around 09/03/2024).  Rosalia Colonel, MD 03/03/2024 8:53 PM [facility]

## 2024-03-03 NOTE — Assessment & Plan Note (Signed)
 Age and sex appropriate education and counseling updated with regular exercise and diet Referrals for preventative services - declines colonoscopy, for hep c screen Immunizations addressed - for tdap pharmacy Smoking counseling  - none needed Evidence for depression or other mood disorder - stable with good med compliance and f/u psychiatry soon Most recent labs reviewed. I have personally reviewed and have noted: 1) the patient's medical and social history 2) The patient's current medications and supplements 3) The patient's height, weight, and BMI have been recorded in the chart

## 2024-03-03 NOTE — Patient Instructions (Addendum)
 Please continue all other medications as before, and refills have been done if requested.  Please have the pharmacy call with any other refills you may need.  Please continue your efforts at being more active, low cholesterol diet, and weight control.  You are otherwise up to date with prevention measures today.  Please keep your appointments with your specialists as you may have planned - dermatology  Please go to the LAB at the blood drawing area for the tests to be done  You will be contacted by phone if any changes need to be made immediately.  Otherwise, you will receive a letter about your results with an explanation, but please check with MyChart first.  Please make an Appointment to return in 6 months, or sooner if needed

## 2024-03-03 NOTE — Assessment & Plan Note (Signed)
 Last vitamin D Lab Results  Component Value Date   VD25OH 8.80 (L) 03/03/2024   Low, to start oral replacement

## 2024-03-03 NOTE — Assessment & Plan Note (Signed)
Stable overall, cont inhaler prn 

## 2024-03-03 NOTE — Assessment & Plan Note (Signed)
 Lab Results  Component Value Date   HGBA1C 7.9 (H) 03/03/2024   uncontrolled, pt to continue current medical treatment jardianc 25 every day, novolog  SSI, lantus  every day, metformin  ER 50 mg - 2 tab bid

## 2024-03-03 NOTE — Assessment & Plan Note (Signed)
 BP Readings from Last 3 Encounters:  03/03/24 120/80  12/29/23 120/80  12/09/23 130/77   Stable, pt to continue medical treatment diovan  160 mg qd

## 2024-03-03 NOTE — Assessment & Plan Note (Signed)
 Lab Results  Component Value Date   CHOL 328 (H) 03/03/2024   CHOL 225 (H) 02/10/2022   CHOL 232 (H) 07/31/2021   Lab Results  Component Value Date   HDL 43.10 03/03/2024   HDL 37 (L) 02/10/2022   HDL 38 (L) 07/31/2021   Lab Results  Component Value Date   LDLCALC 145 (H) 02/10/2022   LDLCALC 136 (H) 07/31/2021   LDLCALC 110 (H) 08/06/2020   Lab Results  Component Value Date   TRIG (H) 03/03/2024    701.0 Triglyceride is over 400; calculations on Lipids are invalid.   TRIG 237 (H) 02/10/2022   TRIG 319 (H) 07/31/2021   Lab Results  Component Value Date   CHOLHDL 8 03/03/2024   CHOLHDL 4.6 08/06/2020   CHOLHDL 6 04/02/2018   Lab Results  Component Value Date   LDLDIRECT 171.0 03/03/2024   LDLDIRECT 120.0 04/02/2018   LDLDIRECT 113.0 02/10/2017  Uncontrolled, now for add fenofibrate  160 mg qd

## 2024-03-04 ENCOUNTER — Encounter: Payer: Self-pay | Admitting: Internal Medicine

## 2024-03-04 ENCOUNTER — Other Ambulatory Visit (HOSPITAL_COMMUNITY): Payer: Self-pay

## 2024-03-04 ENCOUNTER — Telehealth: Payer: Self-pay

## 2024-03-04 LAB — HEPATITIS C ANTIBODY: Hepatitis C Ab: NONREACTIVE

## 2024-03-04 NOTE — Telephone Encounter (Signed)
 Ok to forward for PA office please

## 2024-03-04 NOTE — Telephone Encounter (Signed)
 Cody Short

## 2024-03-04 NOTE — Telephone Encounter (Signed)
 Pharmacy Patient Advocate Encounter   Received notification from Pt Calls Messages that prior authorization for Fenofibrate  160mg  tabs is required/requested.   Insurance verification completed.   The patient is insured through [facility] MEDICAID .   Per test claim: PA required; PA submitted to above mentioned insurance via CoverMyMeds Key/confirmation #/EOC [id number] Status is pending

## 2024-03-05 ENCOUNTER — Encounter: Payer: Self-pay | Admitting: Internal Medicine

## 2024-03-07 MED ORDER — GEMFIBROZIL 600 MG PO TABS: 600.0000 mg | ORAL_TABLET | Freq: Two times a day (BID) | ORAL | 3 refills | Status: DC

## 2024-03-07 NOTE — Addendum Note (Signed)
 Addended by: Roslyn Coombe on: 03/07/2024 07:56 PM   Modules accepted: Orders

## 2024-03-07 NOTE — Telephone Encounter (Signed)
 Pharmacy Patient Advocate Encounter  Received notification from [employer] that Prior Authorization for Fenofibrate  160mg  tabs has been DENIED.  See denial reason below. No denial letter attached in CMM. Will attach denial letter to Media tab once received.   PA #/Case ID/Reference #: [id number]

## 2024-03-07 NOTE — Telephone Encounter (Signed)
 Please to let pt know  Medicaid does not cover fenofibrate   So I changed this to Lopid 600 mg bid, and hopefully better covered  This medication is very important to keep the triglycerides down, so that he wont end up in hospital with acute pancreatitis, so I hope he is able to take this (it is very easy to tolerate)

## 2024-03-08 ENCOUNTER — Other Ambulatory Visit: Payer: Self-pay | Admitting: Psychiatry

## 2024-03-08 ENCOUNTER — Other Ambulatory Visit: Payer: Self-pay

## 2024-03-08 DIAGNOSIS — L299 Pruritus, unspecified: Secondary | ICD-10-CM

## 2024-03-08 MED ORDER — GEMFIBROZIL 600 MG PO TABS
600.0000 mg | ORAL_TABLET | Freq: Two times a day (BID) | ORAL | 3 refills | Status: AC
Start: 2024-03-08 — End: ?

## 2024-03-08 NOTE — Telephone Encounter (Signed)
 Message has been sent to Pt via Mychart.

## 2024-03-09 ENCOUNTER — Other Ambulatory Visit: Payer: Self-pay

## 2024-03-09 ENCOUNTER — Emergency Department (HOSPITAL_BASED_OUTPATIENT_CLINIC_OR_DEPARTMENT_OTHER)
Admission: EM | Admit: 2024-03-09 | Discharge: 2024-03-09 | Disposition: A | Discharge status: Home / Self Care | Attending: Emergency Medicine | Admitting: Emergency Medicine

## 2024-03-09 ENCOUNTER — Encounter: Payer: Self-pay | Admitting: Internal Medicine

## 2024-03-09 ENCOUNTER — Ambulatory Visit: Payer: 59 | Admitting: Internal Medicine

## 2024-03-09 ENCOUNTER — Encounter (HOSPITAL_BASED_OUTPATIENT_CLINIC_OR_DEPARTMENT_OTHER): Payer: Self-pay | Admitting: Emergency Medicine

## 2024-03-09 DIAGNOSIS — R748 Abnormal levels of other serum enzymes: Secondary | ICD-10-CM | POA: Diagnosis not present

## 2024-03-09 DIAGNOSIS — F17210 Nicotine dependence, cigarettes, uncomplicated: Secondary | ICD-10-CM | POA: Insufficient documentation

## 2024-03-09 DIAGNOSIS — I10 Essential (primary) hypertension: Secondary | ICD-10-CM | POA: Insufficient documentation

## 2024-03-09 DIAGNOSIS — E119 Type 2 diabetes mellitus without complications: Secondary | ICD-10-CM | POA: Insufficient documentation

## 2024-03-09 DIAGNOSIS — M791 Myalgia, unspecified site: Secondary | ICD-10-CM | POA: Diagnosis not present

## 2024-03-09 DIAGNOSIS — J45909 Unspecified asthma, uncomplicated: Secondary | ICD-10-CM | POA: Diagnosis not present

## 2024-03-09 DIAGNOSIS — R1010 Upper abdominal pain, unspecified: Secondary | ICD-10-CM | POA: Insufficient documentation

## 2024-03-09 DIAGNOSIS — Z79899 Other long term (current) drug therapy: Secondary | ICD-10-CM | POA: Diagnosis not present

## 2024-03-09 DIAGNOSIS — M25511 Pain in right shoulder: Secondary | ICD-10-CM | POA: Insufficient documentation

## 2024-03-09 DIAGNOSIS — Z7984 Long term (current) use of oral hypoglycemic drugs: Secondary | ICD-10-CM | POA: Insufficient documentation

## 2024-03-09 DIAGNOSIS — M79662 Pain in left lower leg: Secondary | ICD-10-CM | POA: Insufficient documentation

## 2024-03-09 DIAGNOSIS — M79661 Pain in right lower leg: Secondary | ICD-10-CM | POA: Insufficient documentation

## 2024-03-09 DIAGNOSIS — Z794 Long term (current) use of insulin: Secondary | ICD-10-CM | POA: Insufficient documentation

## 2024-03-09 HISTORY — DX: Obstructive sleep apnea (adult) (pediatric): G47.33

## 2024-03-09 HISTORY — DX: Acute pancreatitis without necrosis or infection, unspecified: K85.90

## 2024-03-09 LAB — CBC
HCT: 44.4 % (ref 39.0–52.0)
Hemoglobin: 15 g/dL (ref 13.0–17.0)
MCH: 28.2 pg (ref 26.0–34.0)
MCHC: 33.8 g/dL (ref 30.0–36.0)
MCV: 83.6 fL (ref 80.0–100.0)
Platelets: 228 10*3/uL (ref 150–400)
RBC: 5.31 MIL/uL (ref 4.22–5.81)
RDW: 13.3 % (ref 11.5–15.5)
WBC: 8.1 10*3/uL (ref 4.0–10.5)
nRBC: 0 % (ref 0.0–0.2)

## 2024-03-09 LAB — COMPREHENSIVE METABOLIC PANEL WITH GFR
ALT: 38 U/L (ref 0–44)
AST: 51 U/L — ABNORMAL HIGH (ref 15–41)
Albumin: 4.8 g/dL (ref 3.5–5.0)
Alkaline Phosphatase: 91 U/L (ref 38–126)
Anion gap: 15 (ref 5–15)
BUN: 11 mg/dL (ref 6–20)
CO2: 22 mmol/L (ref 22–32)
Calcium: 10.3 mg/dL (ref 8.9–10.3)
Chloride: 100 mmol/L (ref 98–111)
Creatinine, Ser: 1.17 mg/dL (ref 0.61–1.24)
GFR, Estimated: >60 mL/min (ref >60)
Glucose, Bld: 105 mg/dL — ABNORMAL HIGH (ref 70–99)
Potassium: 3.9 mmol/L (ref 3.5–5.1)
Sodium: 136 mmol/L (ref 135–145)
Total Bilirubin: 0.3 mg/dL (ref 0.0–1.2)
Total Protein: 8 g/dL (ref 6.5–8.1)

## 2024-03-09 LAB — URINALYSIS, ROUTINE W REFLEX MICROSCOPIC
Bacteria, UA: NONE SEEN
Bilirubin Urine: NEGATIVE
Glucose, UA: >1000 mg/dL — AB
Hgb urine dipstick: NEGATIVE
Ketones, ur: NEGATIVE mg/dL
Leukocytes,Ua: NEGATIVE
Nitrite: NEGATIVE
Protein, ur: NEGATIVE mg/dL
Specific Gravity, Urine: <1.005 — ABNORMAL LOW (ref 1.005–1.030)
pH: 6 (ref 5.0–8.0)

## 2024-03-09 LAB — CK: Total CK: 2281 U/L — ABNORMAL HIGH (ref 49–397)

## 2024-03-09 LAB — RESP PANEL BY RT-PCR (RSV, FLU A&B, COVID)  RVPGX2
Influenza A by PCR: NEGATIVE
Influenza B by PCR: NEGATIVE
Resp Syncytial Virus by PCR: NEGATIVE
SARS Coronavirus 2 by RT PCR: NEGATIVE

## 2024-03-09 LAB — LIPASE, BLOOD: Lipase: 85 U/L — ABNORMAL HIGH (ref 11–51)

## 2024-03-09 MED ORDER — CYCLOBENZAPRINE HCL 5 MG PO TABS: 5.0000 mg | ORAL_TABLET | Freq: Three times a day (TID) | ORAL | 1 refills | Status: DC | PRN

## 2024-03-09 MED ORDER — ACETAMINOPHEN 500 MG PO TABS
1000.0000 mg | ORAL_TABLET | Freq: Once | ORAL | Status: AC
  Administered 2024-03-09: 1000 mg via ORAL
  Filled 2024-03-09: qty 2

## 2024-03-09 MED ORDER — SODIUM CHLORIDE 0.9 % IV BOLUS
1000.0000 mL | Freq: Once | INTRAVENOUS | Status: AC
  Administered 2024-03-09: 1000 mL via INTRAVENOUS

## 2024-03-09 NOTE — ED Provider Notes (Signed)
 Cody Short EMERGENCY DEPARTMENT AT [facility] - Inpatient Provider Note   CSN: [id number] Arrival date & time: 03/09/24  1122     History  Chief Complaint  Patient presents with   Abdominal Pain    Cody Short is a 48 y.o. male.   Abdominal Pain   48 year old male presents to the ED with complaints of bodyaches.  Cramping type pain some in his upper abdomen, right shoulder, bilateral lower legs.  States that he has been having symptoms of the past 7-8 hours.  States that afterwards when he started, began to orally hydrate as well as a banana and states his symptoms have improved.  Denies any fevers, chills, chest pain, shortness of breath, nausea, vomit, urinary symptoms, change in bowel habits.  States that he currently is having no symptoms at this time.  Has taken no medication for his symptoms.  This states that he was recently diagnosed with hypertrophic with triglycerides in the 800s as well as vitamin D  level that was low.  Took some leftover fenofibrate  today with plan to start Lopid  per primary care.  Past medical history significant for hypertriglyceridemia, bipolar affective disorder, hypertension, OSA, peritonitis, schizoaffective disorder, hypertension, constipation, internal hemorrhoids, diabetes mellitus type 2, asthma  Home Medications Prior to Admission medications   Medication Sig Start Date End Date Taking? Authorizing Provider  Accu-Chek Softclix Lancets lancets Use as instructed to check blood sugar 4 times daily. DX:E11.65 07/02/22   Emilie Harden, MD  Blood Glucose Monitoring Suppl (CONTOUR NEXT MONITOR) w/Device KIT Check sugar 4 times daily 03/26/22   Emilie Harden, MD  Blood Glucose Monitoring Suppl (FREESTYLE LITE) DEVI Use as instructed to check sugar 4 times daily 01/17/21   Emilie Harden, MD  empagliflozin  (JARDIANCE ) 25 MG TABS tablet Take by mouth 12.5-25 mg daily in am 12/29/23   Emilie Harden, MD  gemfibrozil  (LOPID ) 600 MG tablet  Take 1 tablet (600 mg total) by mouth 2 (two) times daily before a meal. 03/08/24   Roslyn Coombe, MD  glucose blood (ACCU-CHEK GUIDE) test strip USE AS INSTRUCTED TO CHECK BLOOD SUGAR 4 TIMES A DAY 05/26/23   Emilie Harden, MD  hydrOXYzine  (ATARAX ) 10 MG tablet TAKE 1 TABLET BY MOUTH 3 TIMES  DAILY AS NEEDED FOR ITCHING OR  INSOMNIA 03/08/24   Cottle, Kennedy Peabody., MD  insulin  aspart (NOVOLOG  FLEXPEN) 100 UNIT/ML FlexPen Inject 10-30 Units into the skin in the morning, at noon, in the evening, and at bedtime. 12/01/23   Emilie Harden, MD  insulin  glargine (LANTUS  SOLOSTAR) 100 UNIT/ML Solostar Pen INJECT SUBCUTANEOUSLY 60 UNITS  DAILY 02/18/24   Emilie Harden, MD  Insulin  Pen Needle 32G X 4 MM MISC Use 5x a day 03/01/24   Emilie Harden, MD  Insulin  Pen Needle 32G X 6 MM MISC Use 5 times a day 01/15/24   Emilie Harden, MD  lurasidone  (LATUDA ) 80 MG TABS tablet TAKE 2 TABLETS BY MOUTH DAILY  WITH BREAKFAST 02/06/24   Cottle, Kennedy Peabody., MD  metFORMIN  (GLUCOPHAGE -XR) 500 MG 24 hr tablet Take 2 tablets (1,000 mg total) by mouth 2 (two) times daily with a meal. 10/09/23   Emilie Harden, MD  PROVIGIL  200 MG tablet TAKE 1 TABLET BY MOUTH EACH  MORNING 02/09/24   Cottle, Kennedy Peabody., MD  valsartan  (DIOVAN ) 160 MG tablet Take 1 tablet (160 mg total) by mouth daily. 02/19/24   Roslyn Coombe, MD      Allergies    Accutane [isotretinoin],  Divalproex sodium, Methylphenidate hcl, Oxycodone hcl, Ozempic  (0.25 or 0.5 mg-dose) [semaglutide (0.25 or 0.5mg -dos)], Paroxetine, and Rosuvastatin     Review of Systems   Review of Systems  Gastrointestinal:  Positive for abdominal pain.  All other systems reviewed and are negative.   Physical Exam Updated Vital Signs BP 136/89 (BP Location: Right Arm)   Pulse 84   Temp 97.9 F (36.6 C)   Resp 20   SpO2 99%  Physical Exam Vitals and nursing note reviewed.  Constitutional:      General: He is not in acute distress.    Appearance: He is  well-developed.  HENT:     Head: Normocephalic and atraumatic.  Eyes:     Conjunctiva/sclera: Conjunctivae normal.  Cardiovascular:     Rate and Rhythm: Normal rate and regular rhythm.     Heart sounds: No murmur heard. Pulmonary:     Effort: Pulmonary effort is normal. No respiratory distress.     Breath sounds: Normal breath sounds.  Abdominal:     Palpations: Abdomen is soft.     Tenderness: There is no abdominal tenderness.  Musculoskeletal:        General: No swelling.     Cervical back: Neck supple.  Skin:    General: Skin is warm and dry.     Capillary Refill: Capillary refill takes less than 2 seconds.  Neurological:     Mental Status: He is alert.     Comments: Moves all 4 extremities without difficulty.  Psychiatric:        Mood and Affect: Mood normal.     ED Results / Procedures / Treatments   Labs (all labs ordered are listed, but only abnormal results are displayed) Labs Reviewed  LIPASE, BLOOD  COMPREHENSIVE METABOLIC PANEL WITH GFR  CBC  URINALYSIS, ROUTINE W REFLEX MICROSCOPIC    EKG None  Radiology No results found.  Procedures Procedures    Medications Ordered in ED Medications - No data to display  ED Course/ Medical Decision Making/ A&P Clinical Course as of 03/09/24 1522  Wed Mar 09, 2024  1405 CK Total(!): 2,281 Reevaluation of the patient after her first IV fluids noted significant improvement.  Conversation regarding admission to the hospital with elevated CK was made and patient would prefer to go home.  Will give second bag of IV fluids and reassess. [CR]    Clinical Course User Index [CR] Hardy Butter, PA                                 Medical Decision Making Amount and/or Complexity of Data Reviewed Labs: ordered. Decision-making details documented in ED Course.  Risk OTC drugs.   This patient presents to the ED for concern of bodyaches, this involves an extensive number of treatment options, and is a complaint  that carries with it a high risk of complications and morbidity.  The differential diagnosis includes viral illness, rhabdomyolysis, medication side effect, dehydration, other   Co morbidities that complicate the patient evaluation  See HPI   Additional history obtained:  Additional history obtained from EMR External records from outside source obtained and reviewed including hospital records   Lab Tests:  I Ordered, and personally interpreted labs.  The pertinent results include: No leukocytosis.  No evidence of anemia.  Platelets within range.  No Electra abnormalities.  Slight elevation of AST at 51.  No renal dysfunction.  UA with greater than thousand glucose  with patient on SGLT2, testing negative.  CK elevated of 2281   Imaging Studies ordered:  N/a   Cardiac Monitoring: / EKG:  The patient was maintained on a cardiac monitor.  I personally viewed and interpreted the cardiac monitored which showed an underlying rhythm of: sinus rhythm   Consultations Obtained:  See above   Problem List / ED Course / Critical interventions / Medication management  Elevated CK I ordered medication including Tylenol , normal saline   Reevaluation of the patient after these medicines showed that the patient improved I have reviewed the patients home medicines and have made adjustments as needed   Social Determinants of Health:  Home cigarette use.  Denies illicit drug use.   Test / Admission - Considered:  Elevated CK Vitals signs within normal range and stable throughout visit. Laboratory/imaging studies significant for: see above 48 year old male presents to the ED with complaints of bodyaches.  Cramping type pain some in his upper abdomen, right shoulder, bilateral lower legs.  States that he has been having symptoms of the past 7-8 hours.  States that afterwards when he started, began to orally hydrate as well as a banana and states his symptoms have improved.  Denies any  fevers, chills, chest pain, shortness of breath, nausea, vomit, urinary symptoms, change in bowel habits.  States that he currently is having no symptoms at this time.  Has taken no medication for his symptoms.  This states that he was recently diagnosed with hypertrophic with triglycerides in the 800s as well as vitamin D  level that was low.  Took some leftover fenofibrate  today with plan to start Lopid  per primary care. On exam, no reproducible tenderness.  No overlying skin abnormalities.  Patient without any current complaint at the moment but was having somewhat migratory muscular spasms earlier.  Workup today concerning for elevated CK of 2281 with very mildly elevated AST at 51 otherwise normal liver function as well as renal function.  Per patient history, over the past few days, has been more sedentary.  States that he simply videogames and napping every hour with very minimal physical activity.  Patient not on statin or any other medication concerning for precipitation of rhabdomyolysis.  Suspect it was mainly due to inactivity.  Treated with 2 L of IV fluids and noted significant improvement subsequent ambulation without difficulty.  Offered admission versus at home treatment of symptoms of which patient elected to try to manage symptoms at home and follow-up with primary care.  This seemed reasonable.  Will recommend somewhat regular physical activity, continued oral hydration and follow-up with PCP.  Treatment plan discussed with patient and he acknowledged understanding was agreeable to said plan.  Patient overall well-appearing, afebrile in no acute distress. Worrisome signs and symptoms were discussed with the patient, and the patient acknowledged understanding to return to the ED if noticed. Patient was stable upon discharge.          Final Clinical Impression(s) / ED Diagnoses Final diagnoses:  None    Rx / DC Orders ED Discharge Orders     None         [place] Butter,  [place] 03/09/24 1525    Auston Blush, MD 03/15/24 1315

## 2024-03-09 NOTE — Discharge Instructions (Signed)
 As discussed, your CK was elevated concerning for muscle breakdown.  Will recommend continued regular activity but please do not overexert yourself as discussed to make this worse.  Continue oral hydration.  Recommend follow-up with primary care for reevaluation.  Please not hesitate to return if the worrisome signs and symptoms we discussed become apparent.

## 2024-03-09 NOTE — ED Triage Notes (Signed)
 Pt started with cramping all over body (the pain sites change ) about 8 hours ago, level 10.  He was recently told his trig in the 800's and vit d was low, started fenofibrate (today),vit d 3 days ago.    Pt has hx pancreatitis.

## 2024-03-09 NOTE — ED Notes (Signed)
 Discharge paperwork given and verbally understood.

## 2024-03-10 ENCOUNTER — Encounter: Payer: Self-pay | Admitting: Internal Medicine

## 2024-03-10 NOTE — Telephone Encounter (Signed)
 Ok sure we should see you back as you mentioned.  Although caffeine intake was quite high, this is not likely to be the cause of the elevated CK, triglycerides cholesterol  Sounds like you a working on a good solution to the caffeine with the diet change.  Please keep this up, hold on taking the lopid (gemfibrozil) for now, and we will need to recheck your lab testing.  Thanks!

## 2024-03-13 ENCOUNTER — Other Ambulatory Visit: Payer: Self-pay | Admitting: Internal Medicine

## 2024-03-13 DIAGNOSIS — E1165 Type 2 diabetes mellitus with hyperglycemia: Secondary | ICD-10-CM

## 2024-03-17 ENCOUNTER — Ambulatory Visit: Admitting: Mental Health

## 2024-03-17 DIAGNOSIS — F3110 Bipolar disorder, current episode manic without psychotic features, unspecified: Secondary | ICD-10-CM

## 2024-03-17 NOTE — Progress Notes (Signed)
 [facility]  Name: Cody Short Date:  03/17/24 MRN: [id number] DOB: 1976/01/23 PCP: Roslyn Coombe, MD  Time spent: 51 minutes Time in: 3: 00 PM time out: 3:51 PM  Treatment:   ind. Therapy  Virtual Visit via Telehealth Note Connected with patient by a telemedicine/telehealth application, with their informed consent, and verified patient privacy and that I am speaking with the correct person using two identifiers. I discussed the limitations, risks, security and privacy concerns of performing psychotherapy and the availability of in person appointments. I also discussed with the patient that there may be a patient responsible charge related to this service. The patient expressed understanding and agreed to proceed. I discussed the treatment planning with the patient. The patient was provided an opportunity to ask questions and all were answered. The patient agreed with the plan and demonstrated an understanding of the instructions. The patient was advised to call  our office if  symptoms worsen or feel they are in a crisis state and need immediate contact.   Therapist Location: office Patient Location: home     Mental Status Exam:    Appearance:    Casual     Behavior:   Appropriate  Motor:   WNL  Speech/Language:    Clear and Coherent  Affect:   Full range   Mood:   Euthymic, anxious  Thought process:   Logical, linear, goal directed  Thought content:     WNL  Sensory/Perceptual disturbances:     none  Orientation:   x4  Attention:   Good  Concentration:   Good  Memory:   Intact  Fund of knowledge:    Consistent with age and development  Insight:     Good  Judgment:    Good  Impulse Control:   Good     Reported Symptoms:  anxiety, rumination, intermittent depressed mood, impulsivity  Risk Assessment: Danger to Self:  No Self-injurious Behavior: No Danger to Others: No Duty to Warn:no Physical Aggression / Violence:No  Access to Firearms a  concern: No  Gang Involvement:No  Patient / guardian was educated about steps to take if suicide or homicide risk level increases between visits: yes While future psychiatric events cannot be accurately predicted, the patient does not currently require acute inpatient psychiatric care and does not currently meet [place]  involuntary commitment criteria.  Subjective:  Patient engaged in telehealth visit via video. Patient share recent challenges, his quitting his job about 2 weeks ago, going on to share details, experiences related to making this decision was impulsive in a moment but reflectively, he feels it was the right decision as he's been frustrated with his job for some time. He plans to look for a new job. He continues to identify a live self-assoon, challenges with self-image particularly with his physical appearance. Ways to cope and specifically reframe some supporting thoughts. Provide support and encouragement for him to continue to identify positive self affirmations between sessions is receptive to working early thoughts and we reviewed the impact it can have on his feelings, self-perception over time. Also, he identified wanting to have a girlfriend at some point and other friendships, in cases and problem solving the patient where he is considering following through with interest-based Meetup groups.   Interventions:  motivational interviewing, CBT  Diagnoses:  No diagnosis found.      Plan: Patient to utilize coping skills as discussed, continue his medication compliance to maintain mood stability. patient will continue to work on identifying  and challenging his obsessive thoughts, particularly regarding relationships, and practicing grounding techniques to manage his anxiety. He will also be encouraged to celebrate his achievements at work and continue to take ownership of his successes.  Long-term goals:   Maintain symptom reduction: The patient will report sustained  reduction in symptoms of anxiety using both CBT and mindfulness interventions for 3 consecutive months progressively.  Improve emotional regulation: The patient will learn and apply CBT and mindfulness-based strategies to regulate emotions, such as mindfulness-based stress reduction and cognitive restructuring, and report an improvement in emotional regulation for at least 3 consecutive months progressively.    Short-term goal:  The patient will learn and apply CBT and mindfulness-based coping skills for managing anxiety and practice using it between sessions.       2.   The patient will CBT and mindfulness-based interventions to increase awareness of negative thought patterns. 3.   The patient will keep and maintain employment to keep financial stress manageable       4.   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         Avram Lenis, [facility] why cannot talk get out of the client violence against a just 72 that message about him I think maybe we does way is the big decision and we may need to do it but did you check on the

## 2024-03-23 ENCOUNTER — Other Ambulatory Visit (INDEPENDENT_AMBULATORY_CARE_PROVIDER_SITE_OTHER)

## 2024-03-23 ENCOUNTER — Other Ambulatory Visit: Payer: Self-pay | Admitting: Internal Medicine

## 2024-03-23 DIAGNOSIS — E538 Deficiency of other specified B group vitamins: Secondary | ICD-10-CM | POA: Diagnosis not present

## 2024-03-23 DIAGNOSIS — E559 Vitamin D deficiency, unspecified: Secondary | ICD-10-CM | POA: Diagnosis not present

## 2024-03-23 DIAGNOSIS — N320 Bladder-neck obstruction: Secondary | ICD-10-CM

## 2024-03-23 DIAGNOSIS — R748 Abnormal levels of other serum enzymes: Secondary | ICD-10-CM

## 2024-03-23 DIAGNOSIS — E1165 Type 2 diabetes mellitus with hyperglycemia: Secondary | ICD-10-CM

## 2024-03-23 DIAGNOSIS — E781 Pure hyperglyceridemia: Secondary | ICD-10-CM

## 2024-03-23 LAB — CBC WITH DIFFERENTIAL/PLATELET
Basophils Absolute: 0 10*3/uL (ref 0.0–0.1)
Basophils Relative: 0.5 % (ref 0.0–3.0)
Eosinophils Absolute: 0.1 10*3/uL (ref 0.0–0.7)
Eosinophils Relative: 1.4 % (ref 0.0–5.0)
HCT: 42.7 % (ref 39.0–52.0)
Hemoglobin: 14.5 g/dL (ref 13.0–17.0)
Lymphocytes Relative: 28.4 % (ref 12.0–46.0)
Lymphs Abs: 1.6 10*3/uL (ref 0.7–4.0)
MCHC: 33.8 g/dL (ref 30.0–36.0)
MCV: 83.8 fl (ref 78.0–100.0)
Monocytes Absolute: 0.5 10*3/uL (ref 0.1–1.0)
Monocytes Relative: 7.8 % (ref 3.0–12.0)
Neutro Abs: 3.6 10*3/uL (ref 1.4–7.7)
Neutrophils Relative %: 61.9 % (ref 43.0–77.0)
Platelets: 190 10*3/uL (ref 150.0–400.0)
RBC: 5.1 Mil/uL (ref 4.22–5.81)
RDW: 14 % (ref 11.5–15.5)
WBC: 5.8 10*3/uL (ref 4.0–10.5)

## 2024-03-23 LAB — BASIC METABOLIC PANEL WITH GFR
BUN: 17 mg/dL (ref 6–23)
CO2: 24 meq/L (ref 19–32)
Calcium: 9.9 mg/dL (ref 8.4–10.5)
Chloride: 104 meq/L (ref 96–112)
Creatinine, Ser: 1.25 mg/dL (ref 0.40–1.50)
GFR: 69.53 mL/min (ref >60.00)
Glucose, Bld: 115 mg/dL — ABNORMAL HIGH (ref 70–99)
Potassium: 4.2 meq/L (ref 3.5–5.1)
Sodium: 139 meq/L (ref 135–145)

## 2024-03-23 LAB — HEPATIC FUNCTION PANEL
ALT: 21 U/L (ref 0–53)
AST: 22 U/L (ref 0–37)
Albumin: 4.9 g/dL (ref 3.5–5.2)
Alkaline Phosphatase: 67 U/L (ref 39–117)
Bilirubin, Direct: 0.1 mg/dL (ref 0.0–0.3)
Total Bilirubin: 0.5 mg/dL (ref 0.2–1.2)
Total Protein: 7.6 g/dL (ref 6.0–8.3)

## 2024-03-23 LAB — LIPID PANEL
Cholesterol: 232 mg/dL — ABNORMAL HIGH (ref 0–200)
HDL: 40.9 mg/dL (ref >39.00)
LDL Cholesterol: 129 mg/dL — ABNORMAL HIGH (ref 0–99)
NonHDL: 191.53
Total CHOL/HDL Ratio: 6
Triglycerides: 313 mg/dL — ABNORMAL HIGH (ref 0.0–149.0)
VLDL: 62.6 mg/dL — ABNORMAL HIGH (ref 0.0–40.0)

## 2024-03-23 LAB — TSH: TSH: 1.01 u[IU]/mL (ref 0.35–5.50)

## 2024-03-23 LAB — PSA: PSA: 0.47 ng/mL (ref 0.10–4.00)

## 2024-03-23 LAB — CK: Total CK: 274 U/L — ABNORMAL HIGH (ref 7–232)

## 2024-03-23 LAB — VITAMIN B12: Vitamin B-12: 494 pg/mL (ref 211–911)

## 2024-03-23 LAB — MICROALBUMIN / CREATININE URINE RATIO
Creatinine,U: 69.7 mg/dL
Microalb Creat Ratio: 27.3 mg/g (ref 0.0–30.0)
Microalb, Ur: 1.9 mg/dL (ref 0.0–1.9)

## 2024-03-23 LAB — HEMOGLOBIN A1C: Hgb A1c MFr Bld: 7.6 % — ABNORMAL HIGH (ref 4.6–6.5)

## 2024-03-23 LAB — VITAMIN D 25 HYDROXY (VIT D DEFICIENCY, FRACTURES): VITD: 16.48 ng/mL — ABNORMAL LOW (ref 30.00–100.00)

## 2024-03-24 ENCOUNTER — Ambulatory Visit: Payer: Self-pay | Admitting: Internal Medicine

## 2024-03-24 ENCOUNTER — Other Ambulatory Visit: Payer: Self-pay | Admitting: Internal Medicine

## 2024-03-24 LAB — URINALYSIS, ROUTINE W REFLEX MICROSCOPIC
Bilirubin Urine: NEGATIVE
Hgb urine dipstick: NEGATIVE
Ketones, ur: NEGATIVE
Leukocytes,Ua: NEGATIVE
Nitrite: NEGATIVE
RBC / HPF: NONE SEEN (ref 0–?)
Specific Gravity, Urine: 1.01 (ref 1.000–1.030)
Total Protein, Urine: NEGATIVE
Urine Glucose: >=1000 — AB
Urobilinogen, UA: 0.2 (ref 0.0–1.0)
WBC, UA: NONE SEEN (ref 0–?)
pH: 6 (ref 5.0–8.0)

## 2024-03-24 MED ORDER — ROSUVASTATIN CALCIUM 10 MG PO TABS: 10.0000 mg | ORAL_TABLET | Freq: Every day | ORAL | 3 refills | Status: DC

## 2024-03-28 ENCOUNTER — Encounter: Payer: Self-pay | Admitting: Internal Medicine

## 2024-04-08 ENCOUNTER — Encounter: Payer: Self-pay | Admitting: Internal Medicine

## 2024-04-11 MED ORDER — EMPAGLIFLOZIN 25 MG PO TABS: ORAL_TABLET | ORAL | 2 refills | Status: DC

## 2024-04-12 ENCOUNTER — Telehealth: Payer: Self-pay

## 2024-04-12 ENCOUNTER — Other Ambulatory Visit (HOSPITAL_COMMUNITY): Payer: Self-pay

## 2024-04-12 NOTE — Telephone Encounter (Signed)
 PA request has been Submitted. New Encounter has been or will be created for follow up. For additional info see Pharmacy Prior Auth telephone encounter from 04/12/24.

## 2024-04-12 NOTE — Telephone Encounter (Signed)
 Pharmacy Patient Advocate Encounter  Received notification from [employer] that Prior Authorization for Jardiance  25MG  tablets  has been APPROVED from 04/12/24 to 04/12/25. Ran test claim, Copay is $4. This test claim was processed through [facility]- copay amounts may vary at other pharmacies due to pharmacy/plan contracts, or as the patient moves through the different stages of their insurance plan.   PA #/Case ID/Reference #: [id number]

## 2024-04-12 NOTE — Telephone Encounter (Signed)
 Pharmacy Patient Advocate Encounter   Received notification from Pt Calls Messages that prior authorization for Jardiance  25MG  tablets is required/requested.   Insurance verification completed.   The patient is insured through [facility] .   Per test claim: PA required; PA submitted to above mentioned insurance via CoverMyMeds Key/confirmation #/EOC [id number] Status is pending

## 2024-04-12 NOTE — Telephone Encounter (Signed)
 Pt needs a PA for Jardiance . Currently out.

## 2024-04-13 ENCOUNTER — Other Ambulatory Visit: Payer: Self-pay | Admitting: Psychiatry

## 2024-04-13 DIAGNOSIS — L299 Pruritus, unspecified: Secondary | ICD-10-CM

## 2024-04-14 ENCOUNTER — Ambulatory Visit: Admitting: Mental Health

## 2024-04-14 DIAGNOSIS — F3110 Bipolar disorder, current episode manic without psychotic features, unspecified: Secondary | ICD-10-CM | POA: Diagnosis not present

## 2024-04-14 NOTE — Progress Notes (Signed)
 [facility] Note  Name: Cody Short Date: 04/14/2024 MRN: [id number] DOB: May 27, 1976 PCP: Roslyn Coombe, MD  Time spent: 50 minutes Time in: 3:00 p.m. time out 3: 5 0 p.m.  Treatment:   ind. Therapy  Virtual Visit via Telehealth Note Connected with patient by a telemedicine/telehealth application, with their informed consent, and verified patient privacy and that I am speaking with the correct person using two identifiers. I discussed the limitations, risks, security and privacy concerns of performing psychotherapy and the availability of in person appointments. I also discussed with the patient that there may be a patient responsible charge related to this service. The patient expressed understanding and agreed to proceed. I discussed the treatment planning with the patient. The patient was provided an opportunity to ask questions and all were answered. The patient agreed with the plan and demonstrated an understanding of the instructions. The patient was advised to call  our office if  symptoms worsen or feel they are in a crisis state and need immediate contact.   Therapist Location: office Patient Location: home     Mental Status Exam:    Appearance:    Casual     Behavior:   Appropriate  Motor:   WNL  Speech/Language:    Clear and Coherent  Affect:   Full range   Mood:   Euthymic, anxious  Thought process:   Logical, linear, goal directed  Thought content:     WNL  Sensory/Perceptual disturbances:     none  Orientation:   x4  Attention:   Good  Concentration:   Good  Memory:   Intact  Fund of knowledge:    Consistent with age and development  Insight:     Good  Judgment:    Good  Impulse Control:   Good     Reported Symptoms:  anxiety, rumination, intermittent depressed mood, impulsivity  Risk Assessment: Danger to Self:  No Self-injurious Behavior: No Danger to Others: No Duty to Warn:no Physical Aggression / Violence:No  Access to  Firearms a concern: No  Gang Involvement:No  Patient / guardian was educated about steps to take if suicide or homicide risk level increases between visits: yes While future psychiatric events cannot be accurately predicted, the patient does not currently require acute inpatient psychiatric care and does not currently meet   involuntary commitment criteria.  Subjective:  Patient engaged in telehealth visit via video.  He shared positive changes, how he and his wife worked to make progress on driving and managing his anxiety.  He stated that he is taken incremental steps over the past 3 weeks, increasing his frequency of driving and his exposure to the road including highways where he stated his anxiety has lowered considerably.  He stated he is followed through being mindful of his thoughts associated.  He continues to be unemployed after quitting his job about 2 months ago.  He stated that he had considerable difficulty working with customers, often becoming agitated and it impairing his ability to get promotions.  He shared how he also has difficulty with his memory, stating that he cannot recall some conversations he had about a year ago when he sees the messages on his phone.  He stated that he also cannot remember details from the distant past but he also recognizes this being difficult due to it being many years ago.  Ongoing stress at home has continued regarding his brother.  Through further guided discovery, he identified the possibility of trying to communicate  with others, have more friendships and potentially a girlfriend at some point.  He plans on considering utilizing dating apps.  Interventions:  motivational interviewing, CBT  Diagnoses:    ICD-10-CM   1. Bipolar I disorder, most recent episode (or current) manic (HCC)  F31.10        Plan: Patient to utilize coping skills as discussed, continue his medication compliance to maintain mood stability. patient will continue  to work on identifying and challenging his obsessive thoughts, particularly regarding relationships, and practicing grounding techniques to manage his anxiety. He will also be encouraged to celebrate his achievements at work and continue to take ownership of his successes.  Long-term goals:   Maintain symptom reduction: The patient will report sustained reduction in symptoms of anxiety using both CBT and mindfulness interventions for 3 consecutive months progressively.  Improve emotional regulation: The patient will learn and apply CBT and mindfulness-based strategies to regulate emotions, such as mindfulness-based stress reduction and cognitive restructuring, and report an improvement in emotional regulation for at least 3 consecutive months progressively.    Short-term goal:  The patient will learn and apply CBT and mindfulness-based coping skills for managing anxiety and practice using it between sessions.       2.   The patient will CBT and mindfulness-based interventions to increase awareness of negative thought patterns. 3.   The patient will keep and maintain employment to keep financial stress manageable       4.   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         Avram Lenis, [facility] why cannot talk get out of the client violence against a just 72 that message about him I think maybe we does way is the big decision and we may need to do it but did you check on the

## 2024-04-21 DIAGNOSIS — G4733 Obstructive sleep apnea (adult) (pediatric): Secondary | ICD-10-CM | POA: Diagnosis not present

## 2024-04-30 ENCOUNTER — Other Ambulatory Visit: Payer: Self-pay | Admitting: Psychiatry

## 2024-04-30 DIAGNOSIS — F3110 Bipolar disorder, current episode manic without psychotic features, unspecified: Secondary | ICD-10-CM

## 2024-05-02 ENCOUNTER — Encounter: Payer: Self-pay | Admitting: Dermatology

## 2024-05-02 ENCOUNTER — Ambulatory Visit: Payer: 59 | Admitting: Dermatology

## 2024-05-02 VITALS — BP 182/78 | HR 102

## 2024-05-02 DIAGNOSIS — L700 Acne vulgaris: Secondary | ICD-10-CM | POA: Diagnosis not present

## 2024-05-02 DIAGNOSIS — L905 Scar conditions and fibrosis of skin: Secondary | ICD-10-CM

## 2024-05-02 MED ORDER — CLINDAMYCIN PHOS-BENZOYL PEROX 1.2-3.75 % EX GEL: 1.0000 | Freq: Every morning | CUTANEOUS | 4 refills | Status: AC

## 2024-05-02 MED ORDER — ARAZLO 0.045 % EX LOTN: 1.0000 | TOPICAL_LOTION | Freq: Every evening | CUTANEOUS | 5 refills | Status: AC

## 2024-05-02 NOTE — Progress Notes (Unsigned)
 New Patient Visit   Subjective  Cody Short is a 48 y.o. male who presents for the following: Patient here to discuss treatment options for acne.  Cody Short, a male patient with a history of acne and high cholesterol/triglycerides, presents for evaluation and management of recurrent acne. The patient reports a long-standing history of acne since his teenage years, which was initially treated with three courses of Accutane, resulting in complete resolution. However, approximately 6-10 years later, his acne recurred after starting Latuda , a medication he continues to take for an unspecified condition.  The patient attempted another course of Accutane in 2024 under the care of Dr. Dietrich. However, this treatment was discontinued after about 4 months due to the development of pancreatitis. The patient reports that he had experienced pancreatitis previously, and despite informing Dr. Dietrich, the dosage was increased, leading to severe symptoms requiring emergency room treatment.  Mr. Cody Short has tried various treatments for his acne, including doxycycline  and minocycline. He reports that these antibiotics caused significant stomach upset, even when taken with food. The medications were initially effective for about 3-4 weeks but then stopped working. He has also used topical treatments, though specific details were not provided.  The patient's acne currently affects his face, back, and sides. He has been using regular bar soap [facility]) to wash his face and body. No current acne treatments currently used.  Regarding his cholesterol and triglycerides, the patient reports that these levels have consistently been high, even when exercising and dieting. He was recently started on fenofibrate  for cholesterol management. For the past 2 months, he has been dieting and exercising, limiting his saturated fat intake to about 20 grams per day. Despite these efforts, his cholesterol and  triglyceride levels remain elevated.  The following portions of the chart were reviewed this encounter and updated as appropriate: medications, allergies, medical history  Review of Systems:  No other skin or systemic complaints except as noted in HPI or Assessment and Plan.  Objective  Well appearing patient in no apparent distress; mood and affect are within normal limits.   A focused examination was performed of the following areas: Chest, back, hips, face and neck  Relevant exam findings are noted in the Assessment and Plan.                 Assessment & Plan  ACNE VULGARIS Exam: Open comedones and inflammatory papules  flared  - Assessment: Patient has a history of acne since adolescence, previously treated with multiple courses of Accutane. Acne recurred after starting Latuda , which he continues to take for an unspecified condition. Most recent Accutane course in 2024 was discontinued after 4 months due to pancreatitis. Current presentation involves facial and truncal acne. Previous treatments with oral antibiotics (doxycycline , minocycline) were ineffective after 3-4 weeks and caused gastrointestinal upset. Generic topical treatments have been tried without success. Given the patient's history of adverse reactions to systemic treatments and persistent hyperlipidemia, a regimen of potent topical medications is deemed appropriate.  - Plan:    Initiate Onexton (clindamycin /benzoyl peroxide) topical in the morning for facial acne     - Apply pea-sized amount to face after cleansing with CeraVe cleanser     - Follow with CeraVe moisturizer    Initiate Arazlo  (tazarotene ) topical at night for facial and truncal acne     - For face: Apply pea-sized amount after cleansing, followed by moisturizer     - For body: Mix bean-sized amount with quarter-sized amount of CeraVe lotion,  apply to chest and back     - Start with twice weekly application (e.g., Monday and Thursday) for  the first month     - Increase to every other night after the first month if tolerated    Prescriptions to be filled through [facility] specialty pharmacy for cost-effectiveness    Patient education:     - Proper application techniques and potential side effects (e.g., dryness, irritation)  Follow up in 4 months to assess efficacy and tolerability of new regimen.       Return in about 4 months (around 09/02/2024) for acne follow up.  Cody Short, Surg [employer] III, am acting as scribe for [employer], DO.   Documentation: I have reviewed the above documentation for accuracy and completeness, and I agree with the above.  Cody Lenis, DO

## 2024-05-02 NOTE — Patient Instructions (Addendum)
 Date: Mon May 02 2024  Hello Cody Short,  Thank you for visiting today. Here is a summary of the key instructions:  - Medications (Morning):   - Wash face with CeraVe cleanser   - Apply pea-sized amount of Onexton to face   - Apply CeraVe moisturizer after Onexton  - Medications (Night):   - For face: Apply pea-sized amount of Arazlo , then moisturizer   - For body: Mix bean-sized amount with quarter-sized amount of CeraVe lotion   - Start with 2 nights a week (e.g., Monday and Thursday) for the first month   - After first month, increase to every other night  - Skin Care:   - Use CeraVe cleanser for face instead of bar soap   - Apply CeraVe moisturizer after medications  - Pharmacy:   - Prescriptions will be sent to [facility]   - Respond to text from [employer] with picture of insurance card   - Only accept copay of $0 or $40 for medications  - Follow-up:   - Return in 4 months for follow-up appointment  - Lifestyle:   - Continue diet and exercise routine   - Avoid foods that may increase cholesterol  Please reach out if you have any questions or concerns.  Warm regards,  Dr. [facility]   Important Information  Due to recent changes in healthcare laws, you may see results of your pathology and/or laboratory studies on MyChart before the doctors have had a chance to review them. We understand that in some cases there may be results that are confusing or concerning to you. Please understand that not all results are received at the same time and often the doctors may need to interpret multiple results in order to provide you with the best plan of care or course of treatment. Therefore, we ask that you please give us  2 business days to thoroughly review all your results before contacting the office for clarification. Should we see a critical lab result, you will be contacted sooner.   If You Need Anything After Your Visit  If you have any questions or concerns  for your doctor, please call our main line at [phone] If no one answers, please leave a voicemail as directed and we will return your call as soon as possible. Messages left after 4 pm will be answered the following business day.   You may also send us  a message via MyChart. We typically respond to MyChart messages within 1-2 business days.  For prescription refills, please ask your pharmacy to contact our office. Our fax number is [phone].  If you have an urgent issue when the clinic is closed that cannot wait until the next business day, you can page your doctor at the number below.    Please note that while we do our best to be available for urgent issues outside of office hours, we are not available 24/7.   If you have an urgent issue and are unable to reach us , you may choose to seek medical care at your doctor's office, retail clinic, urgent care center, or emergency room.  If you have a medical emergency, please immediately call 911 or go to the emergency department. In the event of inclement weather, please call our main line at [phone] for an update on the status of any delays or closures.  Dermatology Medication Tips: Please keep the boxes that topical medications come in in order to help keep track of the instructions about where and how  to use these. Pharmacies typically print the medication instructions only on the boxes and not directly on the medication tubes.   If your medication is too expensive, please contact our office at [phone] or send us  a message through MyChart.   We are unable to tell what your co-pay for medications will be in advance as this is different depending on your insurance coverage. However, we may be able to find a substitute medication at lower cost or fill out paperwork to get insurance to cover a needed medication.   If a prior authorization is required to get your medication covered by your insurance company, please allow us  1-2  business days to complete this process.  Drug prices often vary depending on where the prescription is filled and some pharmacies may offer cheaper prices.  The website www.goodrx.com contains coupons for medications through different pharmacies. The prices here do not account for what the cost may be with help from insurance (it may be cheaper with your insurance), but the website can give you the price if you did not use any insurance.  - You can print the associated coupon and take it with your prescription to the pharmacy.  - You may also stop by our office during regular business hours and pick up a GoodRx coupon card.  - If you need your prescription sent electronically to a different pharmacy, notify our office through [facility] or by phone at [phone]

## 2024-05-03 DIAGNOSIS — K029 Dental caries, unspecified: Secondary | ICD-10-CM | POA: Diagnosis not present

## 2024-05-09 ENCOUNTER — Telehealth: Payer: Medicaid Other | Admitting: Psychiatry

## 2024-05-12 ENCOUNTER — Encounter: Payer: Self-pay | Admitting: Internal Medicine

## 2024-05-12 ENCOUNTER — Ambulatory Visit: Admitting: Internal Medicine

## 2024-05-12 VITALS — BP 120/80 | HR 95 | Ht 67.0 in | Wt 228.0 lb

## 2024-05-12 DIAGNOSIS — E061 Subacute thyroiditis: Secondary | ICD-10-CM

## 2024-05-12 DIAGNOSIS — Z794 Long term (current) use of insulin: Secondary | ICD-10-CM

## 2024-05-12 DIAGNOSIS — E66812 Obesity, class 2: Secondary | ICD-10-CM

## 2024-05-12 DIAGNOSIS — E782 Mixed hyperlipidemia: Secondary | ICD-10-CM

## 2024-05-12 DIAGNOSIS — Z7984 Long term (current) use of oral hypoglycemic drugs: Secondary | ICD-10-CM

## 2024-05-12 DIAGNOSIS — E1165 Type 2 diabetes mellitus with hyperglycemia: Secondary | ICD-10-CM

## 2024-05-12 MED ORDER — EMPAGLIFLOZIN 10 MG PO TABS: 10.0000 mg | ORAL_TABLET | Freq: Every day | ORAL | 3 refills | Status: AC

## 2024-05-12 MED ORDER — NOVOFINE PLUS PEN NEEDLE 32G X 4 MM MISC: 3 refills | Status: DC

## 2024-05-12 MED ORDER — LANTUS SOLOSTAR 100 UNIT/ML ~~LOC~~ SOPN: 25.0000 [IU] | PEN_INJECTOR | Freq: Every day | SUBCUTANEOUS | Status: DC

## 2024-05-12 NOTE — Patient Instructions (Addendum)
 Please continue: - Metformin  ER 2000 mg at dinnertime  Try to decrease: - Jardiance  5-10 mg daily in am - Novolog  before meals: 15-20 units  - Lantus  25 units at bedtime  Please return in 3-4 months with your sugar log.
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
## 2024-05-13 ENCOUNTER — Other Ambulatory Visit: Payer: Self-pay | Admitting: Psychiatry

## 2024-05-13 DIAGNOSIS — G4733 Obstructive sleep apnea (adult) (pediatric): Secondary | ICD-10-CM

## 2024-05-13 DIAGNOSIS — F902 Attention-deficit hyperactivity disorder, combined type: Secondary | ICD-10-CM

## 2024-05-14 ENCOUNTER — Encounter: Payer: Self-pay | Admitting: Internal Medicine

## 2024-05-15 ENCOUNTER — Telehealth: Payer: Self-pay

## 2024-05-16 ENCOUNTER — Encounter: Payer: Self-pay | Admitting: Internal Medicine

## 2024-05-16 ENCOUNTER — Telehealth: Payer: Self-pay | Admitting: Physician Assistant

## 2024-05-16 MED ORDER — NOVOFINE PLUS PEN NEEDLE 32G X 4 MM MISC: 3 refills | Status: DC

## 2024-05-16 NOTE — Telephone Encounter (Signed)
 PA request is for modafinil . He is on Medicaid and they require brand.

## 2024-05-16 NOTE — Telephone Encounter (Signed)
 Patient is Medicaid and they require brand. A PA was done for brand, but pharmacy keeps trying to fill it with generic and it requires a PA. Talked to pharmacy and they have ordered it and it may be here tomorrow or Wednesday. Informed patient. He said he is not out yet.

## 2024-05-16 NOTE — Addendum Note (Signed)
 Addended by: CLEOTILDE ROLIN RAMAN on: 05/16/2024 03:48 PM   Modules accepted: Orders

## 2024-05-16 NOTE — Telephone Encounter (Signed)
 Patient called he needs PA for Provigil  200mg . Ph: [phone] Appt 8/14

## 2024-05-17 ENCOUNTER — Other Ambulatory Visit: Payer: Self-pay | Admitting: Internal Medicine

## 2024-05-23 NOTE — Telephone Encounter (Signed)
 Called pharmacy and pharmacist said there were no notes about not being able to get brand Provigil . Said they could order for central pharmacy, would take 2 days to get it.

## 2024-05-23 NOTE — Telephone Encounter (Signed)
 Pt lvm 7/26 5:45 pm reporting med is d/c all stores. Insurance only covers brand. Need cost with out insurance. Contact # [phone]

## 2024-05-24 ENCOUNTER — Ambulatory Visit: Admitting: Mental Health

## 2024-05-24 DIAGNOSIS — G4733 Obstructive sleep apnea (adult) (pediatric): Secondary | ICD-10-CM | POA: Diagnosis not present

## 2024-05-24 DIAGNOSIS — F3110 Bipolar disorder, current episode manic without psychotic features, unspecified: Secondary | ICD-10-CM | POA: Diagnosis not present
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
## 2024-05-28 ENCOUNTER — Other Ambulatory Visit: Payer: Self-pay | Admitting: Psychiatry

## 2024-05-28 ENCOUNTER — Other Ambulatory Visit: Payer: Self-pay | Admitting: Internal Medicine

## 2024-05-28 DIAGNOSIS — E1165 Type 2 diabetes mellitus with hyperglycemia: Secondary | ICD-10-CM

## 2024-05-28 DIAGNOSIS — F3110 Bipolar disorder, current episode manic without psychotic features, unspecified: Secondary | ICD-10-CM

## 2024-05-28 DIAGNOSIS — L299 Pruritus, unspecified: Secondary | ICD-10-CM

## 2024-06-09 ENCOUNTER — Encounter: Payer: Self-pay | Admitting: Psychiatry

## 2024-06-09 ENCOUNTER — Telehealth: Admitting: Psychiatry

## 2024-06-09 DIAGNOSIS — F3110 Bipolar disorder, current episode manic without psychotic features, unspecified: Secondary | ICD-10-CM | POA: Diagnosis not present

## 2024-06-09 DIAGNOSIS — F5105 Insomnia due to other mental disorder: Secondary | ICD-10-CM

## 2024-06-09 DIAGNOSIS — F5113 Hypersomnia due to other mental disorder: Secondary | ICD-10-CM

## 2024-06-09 DIAGNOSIS — F4001 Agoraphobia with panic disorder: Secondary | ICD-10-CM

## 2024-06-09 DIAGNOSIS — F411 Generalized anxiety disorder: Secondary | ICD-10-CM

## 2024-06-09 DIAGNOSIS — F902 Attention-deficit hyperactivity disorder, combined type: Secondary | ICD-10-CM

## 2024-06-09 DIAGNOSIS — F1211 Cannabis abuse, in remission: Secondary | ICD-10-CM

## 2024-06-09 DIAGNOSIS — G4733 Obstructive sleep apnea (adult) (pediatric): Secondary | ICD-10-CM

## 2024-06-09 MED ORDER — MODAFINIL 200 MG PO TABS: 200.0000 mg | ORAL_TABLET | Freq: Every day | ORAL | 5 refills | Status: AC

## 2024-06-09 MED ORDER — BUPROPION HCL ER (XL) 150 MG PO TB24: 150.0000 mg | ORAL_TABLET | Freq: Every day | ORAL | 0 refills | Status: DC

## 2024-06-09 NOTE — Progress Notes (Signed)
 Cody Short [id number] 1975/12/24 47 y.o.   Video Visit via My Chart  I connected with pt by video using My Chart and verified that I am speaking with the correct person using two identifiers.   I discussed the limitations, risks, security and privacy concerns of performing an evaluation and management service by My Chart  and the availability of in person appointments. I also discussed with the patient that there may be a patient responsible charge related to this service. The patient expressed understanding and agreed to proceed.  I discussed the assessment and treatment plan with the patient. The patient was provided an opportunity to ask questions and all were answered. The patient agreed with the plan and demonstrated an understanding of the instructions.   The patient was advised to call back or seek an in-person evaluation if the symptoms worsen or if the condition fails to improve as anticipated.  I provided 30 minutes of video time during this encounter.  The patient was located at home and the provider was located office. Session 320-350 Subjective:   Patient ID:  Cody Short is a 48 y.o. (DOB 1976/04/21) male.  Chief Complaint:  Chief Complaint  Patient presents with   Follow-up   Anxiety   Manic Behavior   Sleeping Problem     Cody Short presents to the office today for follow-up of several psychiatric diagnoses ...    seen August 2020.  For obsessive anxiety  Switched Lexapro  5 mg daily on May 18.  But he stated it made him hungry and so it was stopped in August.  He continued on Saphris  10 mg nightly, lithium  1200 mg daily, propranolol  as needed tremor.  Lithium  level was ordered.  He called Sept saying he was manic.  Had a lithium  level 0.7 and so dose was increased to 1500 mg daily.    seen December 21, 2019.  He had some ongoing manic symptoms and Saphris  was increased to 15 mg nightly.  02/07/2020 appointment with the following  noted: He has made multiple phone calls to the office since that date.  He decided he wanted to stop lithium  because of urinary problems.  He was warned this could lead to mania but he wanted to pursue it anyway.  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  Cody Short had been having bedwetting episodes which she attributed to the lithium .  He has not consistently taken Saphris  15 mg at bedtime since the office visit in February. He completely stopped lithium  January 30, 2020 U frequency is much better and less thirsty.  Bedwetting resolved.  Reduced BM to 1-2 times daily.  Gassy.  Glucose is high.  B is hospitalized with unknown disease.  Insulin  adjusted. Mental status is best it's ever been.  Less mania.  Less obsessing.  No worsening mania. Is taking Saphris  15 mg HS> Plan no med changes.  04/10/2020 appointment with the following noted: No longer on lithium .  Still frequent urination and thirst DT DM poorly controlled despite meds.  Endo says little else to take bc history of pancreatitis.  Says he goes to urinate q 15 mins. Plan: Wean Saphris  in crossover to loxapine  150 mg HS over 2 weeks bc he and endocrinologist believe it's likely Saphris  is cause of the inability to get his glucose to acceptable levels.   06/07/20 appt with the following noted: He's called a couple of times CO sleepiness and reduced loxapine  to 50  mg HS. Started Vegan diet and BS is better 4 weeks ago.  Thinks glucose is better off the Saphris . Lost 6 #.   CO sleepiness and sleep 16 hours   He thinks it also drove up pulse and blood pressure. Asks about taking Saphris  10 and a second medication. Mood has been fine.   Plan: He wants to restart Saphris  10 mg HS and use a 2nd mood stabilizer. Start Saphris  and reduce Loxapine   To 10 mg at night DT prior failure of 10 Mg Saphris  with lithium .  06/27/2020 phone call from patient: Pt called to report Loxapine  & Saphris  is causing his BP  to be very high and Saphris  is causing blood sugar to be high. Apt 10/18. Need changes before then. Call back @ [phone]  MD response: He had wanted to go back on Saphris  and reduce loxapine  which we did.  Tell him to stop the Saphris  and increase loxapine  to 20 mg nightly.  He did not tolerate 50 mg of loxapine  very well. Nurse TC withpt: Spoke with Cody Short and he will stop the Saphris , but feels the increase in the Loxapine  to 20 mg will increase his blood sugars more. Explained that with coming off the Saphris  he needs to increase it a bit or he will not be stable with his moods. Advised him to stop the Saphris  and would check with Dr. Geoffry about any other recommendations.   07/17/20 appt with the following noted: Doing terrible.  Needs melatonin to sleep.  Claims loxapine  is elevating BP, pulse, TG, cholesterol, glucose.  Claimed Saphris  did it too.   Sees PCP at end of October.   Started Vegan diet about 6 weeks ago and exercising.   A/P: There are no available mood stabilizers that do not have a warning of elevating blood sugar and cholesterol.  He has tried all the mood stabilizers that do not have this warning.  All of the available mood stabilizers left that have any effectiveness are antipsychotics and the FDA has required a class warning on all antipsychotics about blood sugar and cholesterol effects.  This is true despite the fact that not all antipsychotics are equal at increasing these risks.  The patient's reading of these potential side effects is complicating finding an effective treatment.  The only way to prove whether or not these meds are elevating his blood sugar and cholesterol is to stop the medications for a period of time.  This exposes him to manic risk but there is no chance of finding an adequate mood stabilizer that will satisfy him until we can prove whether or not these meds are in fact affecting his blood sugar, cholesterol, blood pressure, and pulse. Therefore he will  stop the loxapine  which is at a low dose. We will check labs in 3 weeks.  We will attempt follow-up in 4 weeks.  If he gets manic he will let us  know.   08/13/2020 appointment with the following noted: Blood sugar is pretty much the same off the loxapine  but BP and pulse improved from 110 to 70 range.  But had started BP med about mid September. Started counselor Cheryal Sauer at [facility] and started nutritionsist.  Has changed and restricted diet. Sees PCP 07/24/20.  Endo is managing DM.   Mood has been really good.  Counseling helped the anxiety.  Getting 10 hours of sleep.  PCP suggested changing time going to bed earlier and it's helping.   Sometimes has racing thoughts and gets  comments from others while playing video games that he needs to take a break. Patient reports stable mood and deniesr irritable moods.  Denies appetite disturbance.  Patient reports that energy and motivation have been good.  Patient denies any difficulty with concentration.  Patient denies any suicidal ideation.  Admits he's easily stressed.   10/09/20 appt with following noted: Getting manic as hell and depressed as hell. Talking too much and obsessing over things.  Talking too loud and doing things extremely fast.  Only depressed a couple of days ago.  Manic sx are brief and not all the time. Sleep 7-8 hours and sleep schedule is more normal. Stayed on Caplyta  and pleased overall with SE. BS is better but hypertension is ongoing. Low TSH. Still counseling. No delusions hallucinations since off drugs. Give samples of Caplyta  42 mg once daily. Prefer no change befor ethe holidays bc he is not continuously manic or depressed. Likely switch to Latuda  after the holidays.  11/19/2020 appointment with the following noted: Best I've ever been except some controllable manic.  Able to do things fast and easily.  Anxiety medicine and counseling has helped.  Better function.  No SE. Not depressed in a  while.  But not sure the Capylta helps mania.   Lost from 230# to 189# with diet and exercise over a long period.   Sleep 4-8 hours, but not that I can't sleep but don't feel like sleeping.  Not itching any more.  I'm a a whole new person.   I can't keep up the mania.  Driving my parents and friends crazy. Blood sugar better and off insulin .  Only taking metformin . Can't afford Caplyta . So feels he must change. Hydroxyzine  really helped anxiety. Plan: DC Caplyta  DT mania.  switch to Latuda  40 then 80 mg with food.  Pick up samples.  12/03/2020 phone call from parents:Parents called and want dr. geoffry to know that Cody Short is maniac and he is having physical aggression and all he does is play video games 24 hours a day and is not sleeping. Parents feel like Cody Short is not giving you the whole picture and needs an adjustment to his medicine. MD response:atient acknowledged that his appointment on January 24 that he was manic while taking Caplyta .  He also acknowledged that his family saw him as being manic.  We discontinued Caplyta  and switched him to Latuda .  If he made the switch and increase the dose in a timely manner he should have been on 80 mg daily for a week now.  His parents are complaining that he is still manic.  Provided he is tolerating the Latuda  reasonably well have him increase to 120 mg daily.  He can take 1-1/2 of the 80 mg tablets until he runs out and then we can send in a refill for 120 mg daily.  Remind him he needs to take that with at least 350 cal. Phone call with patient from nurse: Patient rtc and he did report he's doing great on the medication but is still having mania. Reports staying up all night playing video games then asleep about 5 am and back up at noon. He is taking medication with 350 calories also. Advised him to increase to 120 mg Latuda  daily. He reports having only 40 mg tablets at home so instructed him to take 3 tablets and I would get more samples out for him to pick  up tomorrow. He agreed and verbalized understanding.  12/07/2020 phone call: Patient called again  about his Latuda . He states that it makes him really hungry after he takes it, he takes a nap and wakes up hungry again. He would like to know would it be okay to take before bed instead. MD response: He is unlikely to comply with recommendations about routine sleep and wake times.  He is in a habit of playing video games late into the night.  If he prefers he can split the dosage of the Latuda  between breakfast and another meal.  But it is very important that he get the full dose in every day and take it with 350 cal. Nursing phone call with patient:Rtc to patient and he reports he has to take it all at hs because it makes him sleepy, I advised him to continue that regimen. He also reports not sleeping well but he's been drinking caffeine later in the day. Advised him to not drink any caffeine past 2-3 pm. He agreed. He reports his blood sugars have been elevated some, not as bad but it is up some.   12/20/2020 appointment with the following noted: Mania tremendously better but occ anger outbursts anger.  6 hours of sleep with melatonin, hydroxyzine  and Latuda  120 mg for 2 weeks.  Staying up all night playing video games. Latuda  does make him hungry right after he takes it so takes it before bedtime.  Taking with food. Hyperactivity and loud talking is better.  I can function fast.  Parents see benefit with mania but are concerned about his anger outbursts but he things she takes stress out on him.  Blood sugar is generally good except when eats too much.  Taking it with dinner and HS.   Able to drive around town.  Can cook and clean.  Less ruminating on his past.  Less need for direction.   Counseling is helping at [facility] counseling. Hydroxyzine  helped itching and anxiety.  Plan: Continue Latuda  120 mg PM.  Is getting benefit but only been on it for a couple of weeks.   01/17/2021 appointment with  following noted: So so.  Some anger outburts a couple of times.  Eats too much.  Blood sugar is pretty bad.  Up at night playing video games and then sleeps too late in the day.  Takes Latuda  at evening meal.  Wants to blame meds for these behaviors. Otherwise mood is good I guess.  No serious highs or lows.  Can get in arguments with mother when she tries to correct him and may feel guilty afterwards. Takes hydroxyzine  q 6 hours because of anxiety and bc dx thyroid  inflamed, thyroiditis.  Asks if there's anything to help with overeating. Lost weight from thyroid .   Plan: Continue Latuda  120 mg as he is tolerating it and it appears to be controlling mania better than did the Caplyta  and he is tolerating it better.  02/12/2021 appointment with the following noted: Perfectly good with mood and depression.  Needs hydroxyzine  for itching and anxiety.  Energy poor with thyroiditis. Will sleep too much too bc gets bored and no physical activity. Varies 9-16 hours daily.  Can nap easily.  Not spacy.   No anger lately. Tolerating Latuda  well.   Still eats too many sweets late at night and gets high blood sugars.   Plan: Increase  topiramate  50 mg BID off label for weight loss and disc SE.  He wants to try something.  03/20/2021 appointment with the following noted: Taking meds.  A little benefit with increase topiramate . Mood  is perfectly fine without mania nor depression. Blood sugar is a bit high. Claims doctor thinks rash could be related to Latuda  but he sleeps all the time bc hypothyroidism. Has folliculitis Started treatment for it and it is helping.   Thyroid  problem is a wait and see.  Could take a year to get better. Doesn't think topiramate  added tiredness. Not taking hydroxyzine  much about once daily. Dealing with difficult GM threatening sui if left alone.  Plan: Continue Latuda  120 mg PM.  Is getting benefit.. Better tolerated than others so far but he does say it makes him hungry  but he has chronic impulse control problems including overeating.  05/06/2021 appointment with the following noted: Quit topiramate  DT nausea and not helping at all. Rash resolved. Lipids not terrible but a little up.  TSH normalized. HGBA1C 9.3 Not exercising. Not doing much re: work etc other than video games. Not manic lately. Patient reports stable mood and denies depressed or irritable moods.  Patient denies any recent difficulty with anxiety.   Denies appetite disturbance.  Patient reports that energy and motivation have been good.  Patient denies any difficulty with concentration.  Patient denies any suicidal ideation. Energy is better than it was but still sleeping 12 hours daily and up at night playing videos with friends. Plan: Reduce Latuda  to 80 mg to reduce excessive sleepiness.  If mania then increase it back up 120 mg PM.  Is getting benefit..  06/20/21 TC:  Pt stated the cramps have spread to his arms and legs.He has been on latuda  80 mg qd for awhile now.He stated he is also going to see a cardiologist due to heart spasms. MD response:  The cramps are not likely related to Latuda .  Usually it is related to dehydration although I see from chart review that he had elevated creatinine kinase from a statin and that was probably causing some of his problem.  He needs to make sure he drinks lots of water.  Especially because he is diabetic and that will make him prone to dehydration if he is not controlling his blood sugar. However the sleepiness might be related to Latuda .  He had previously been on 120 mg daily and we reduced it to 80 mg daily and if he is done okay with his mood we can try reducing it 1 more time to 60 mg.  I will send in a prescription for the 60 mg tablet and he can reduce it if he would like to see if the sleepiness is related to Latuda .  If Latuda  is contributing to sleepiness it will get better if he reduces the dose.  If the sleepiness does not get better with the  dosage reduction then Latuda  is not the cause Reduce Latuda  to 60 mg daily  07/30/2021 appt noted: Not much difference in alertness Mania since reducing Latuda . Hypoactive bc thryroid problems and may be the cause of the cramps per the other doctors. Itches badly without hydroxyzine . Spent $500 on gambling game and adeleted.  Past history of drug induced gambling but no major drugs in 15 years. Still exhausted and sleep all day.  But is taying up at night.  Sleeping 16 hours daily.  Wonders if hydroxyzine  25 is making him tired. Itching worse with stress and stress of GM and B crazy. Asks about bipolar shot. Side sleeper and as far as he knows no excessive snoring.  08/23/2021 phone call from patient's father stating that Cody Short had gone off the rails  and it spent $1000 on the night prior doing online gambling and had gone back to staying up all night and sleeping during the day.  He had also been more verbally abusive.  It was suggested that the Latuda  dose be increased from 60 to 80 mg in the evening.  10/09/2021 appointment with the following noted: Sleeping a lot and easily exhausted even after med were changed.   SE a little constipation. History of problems with gambling in the past but not current.  Not thinking of it now. No other manic sx currently.   On Latuda  80 daily now.  Reduced hydroxyzine  to 10 and still sleepy.  No other psych meds Plan: Continue Latuda  80 mg bc manic with less .  If recurrence of mania then increase it back up 120 mg PM.  Is getting benefit..  11/18/2021 phone call: Patient had complained of elevated CK levels and wondered if it could be related to Latuda .  Dr. Geoffry called Dr. Waylan and discussed this issue.  It appears that his CK levels have actually been declining over the last several months and are not at a critical level.  It was agreed that this could be monitored over time and no immediate med change was necessary.  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   1 episode when got angy and said awful things to mother but then apologized.  Only 1 bad day. Otherwise mood has been pretty stable lately. Constipation and can't take miralax bc gives him diarrhea.   Sleep normalized to 8 hours since started his job.  So the med was not the source of the sleepiness. Counselor next Tuesday Only hydroxyzine  is 10 mg HS Only other psych med is Latuda  80 mg daily Plan: Latuda   Been the best med so far for him.     Sleepiness was not better with reduction in Latuda  from 120 to 60 mg daily. Continue Latuda  80 mg bc manic with less .  Option split dose to help sleepiness .   03/12/22 appt noted: Taking hydroxyzine  BID Sleep study next week. 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  At [employer]. Doing well emotionally. Constipation and GI doc tomorrow DT fissure. BS is better A1C 7.4 instead of 9+ Sleep 8 hours.   The only thing he doesn't like is 4 meals daily bc hard to lose weight. Not much mania nor depression. Still anxious and worse without hydroxyzine . Consistent with Latuda  at bedtime with food.   Rash bettter .  Itching managed with BID hydroxyzine . Plan: Continue Latuda  80 mg bc manic with less .  Option split dose to help sleepiness .  If recurrence of mania then increase it back up 120 mg PM.  Is getting benefit..  06/23/22 appt noted: Rough 2 weeks.  Clemens for 46 yo girl at work and was somewhat manic with her and she quit the job.  But is beginning to feel better now. Dx OSA CPAP and tremendous difference.  Not sleeping as much and more productive at work.  But still some initial sleepiness in the AM Not constipation or  diarrhea but slow to move bowels but takes several times in  the AM. Started Colace  2 daily for the last couple of days. Lost 10-15# and now stagnant. Working and eating less and weights off and on 3 weeks and trouble losing weight. At work asking too many questions and is too anxious.  Wonders about more hydroxyzine . Sober for 12 years. Can't split Latuda  bc gets too sleepy and takes it too much at night. Had same job 6 mos.  Got employee of the month. Plan: no changes  07/08/22 tC CO more manic sx 07/30/22 TC: Pt stated generic latuda  is not working for him.He is having manic symptoms such as cleaning his whole house,getting into arguments with his mom and getting agitated very easily.He also gambled $300.He is taking 80 mg  MD response : incr Latuda  to 120 mg daily  09/25/22 appt noted: It made all the difference in the world with incr Latuda  to 120 mg daily.  No sig mania or depression now. Increased stool softeners to 3 twice daily.  BS better with Jardiance  and wt loss 19#. Tolerating Latuda .   No delusions or visions like before and no trouble some intrusive thoughts. Rash is better with clindamycin  solution. Busy with work.  [employer] employee of the month twice at [employer].  37 hours per month. Sleep 8-9 hours. Hydroxyzine  10 HS helps sleep and can't take in daytime. SE can't lose wt on Latuda  with medical help. Plan no med changes  02/04/23 TC: Patient is taking hydroxyzine  and Latuda . He says he takes as prescribed, taking with enough calories. There was a work situation that caused a lot of stress and he was able to talk to someone to calm him down and provide clarity on the situation. He said that he spent $1000 on a gambling game that didn't even provide any pay out. He also said he got into an argument with 2 different customers and that he doesn't usually do this. He is also describing depression. He said he is sleeping 9 hours a day. Patient does not have pressured or  rapid speech, seems to have a calm affect, but is concerned.     MD resp:  I'm out of town.  Only option I can give now is to increase the Latuda  to 1 and 1/2 of the 120 mg tabs temporarily until mania resolves     02/04/23 appt noted: in person Increased Latuda  to 180 mg daily since ehre bc manic sx. Hydroxyzine  10 HS hangover so taking 10 HS Now also Lautda 120 mg daily. Doing good overall.  No sig mania lately. Few days mild dep but not bad.   Some constipation issues.  Takign stool softeners.   Therapy with [facility] is helping a lot. Wonders if Latuda  speeds up sex response.  No compulsive sex behaviors. Lost $7000 on gambling game.  Got banned from game and no other gambling problems now.   B drug addict and causes problems and asking for money and lying.   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   Sleep 8 hours.   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 Accutane several mos.  Rash 90% better. Constipation better with stool softeners. Not much mood swings but dep occ.  7-8 hours sleep. Uses hydroxyzine  for anxiety bc it remains high.  Too tired if more than 20 mg at a time. Meds Latuda  120 mg daily and hydroxyzine  10 BID.   Might  have to go on Medicaid bc income reasons. Therapy with Medford Fischer helped more than any other therapist.   Covid shut down Dad's business. B returned to drug use.  He's neglecting his kid.  B is big stress.   GM died 4 mos ago.   Can be impulsive and prone to addiction. Plan: ADD tx is reasonable bc job affected by difficulty with handling two tasks at once.  Forgetful and can't get back on task.  Work restrictions bc of it.  No regular stimulant. Ok trial modafinil  off label for this problem.  Also to help OSA. Modafinil  100-then 200 mg AM.  09/09/23 appt noted: Modafinil  is the best thing that ever happened to me.  Better  focus, alertness, and function.  ADD completely fixed.  More productive and boss noticed.   No SE.   Flare accutane causing pancreatitis.  Went to ER.  Scared of it and that it might be fatal.   Constipation resolved.   Lost 30# in 6 mos. CPAP is better and sleep better.  Only 6-7 hours of sleep and was 8-9 hours.   Change to MCD 09/27/23.   Changing only PCP and keeping other doctors.   Better with wt loss and glucose now normal.  Dieting and exercise.  Losing more wt.   Psych meds: modafinil  200, Lurasidone  120 daily. Hydroxyzine  10 mg prn,   12/10/23 appt noted: Med: Latuda  160 mg daily, modafinil  200 mg AM Raised Latuda  to 160 mg daily. Dealing with family and customers a million times better.  Mood overall is better with work and family.  My whole life is better with increase Latuda .  Less lashing out at people. B is a ongoing problem at home. Cognitive distortions.   Working on those. SE some wt gain.   Stay up late if no work.  If works then 8 hour sleep.  Will nap if sleep deprived.  Using CPAP more.  Had FU yesterday and doing well with. Still loves the modafinil  for focus and alertness.   B using crack and threatens them if not giving him money.   06/09/24 appt noted:  Med: Latuda  160 mg daily, modafinil  200 mg AM, hydroxyzine   20 HS Doing alright but walked out on job bc can't deal with public.  Last work date around first of May.  I can't handle it at all.  Low tolerance dealing with the public.  Will yell at people.  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  Wants to pursue disability.  Has attorney with [employer].  Doesn't think he could work any other job. Some spending px but not gambling px lately. Dep a lot worse at night.  Napping too much.  Stays up at night. 2 hydroxyzine  helps sleep and anxiety. B driving them crazy.   No problems with meds.  No drug use.   Hard to lose wt bc needs to take Latuda  late in the day.  3-6 cups coffee daily and sometimes energy drinks.  Prior psychiatric medications: risperidone with cognitive side effects, Abilify NR,  Zyprexa SE glucose, Seroquel 600 mg SE wt, perphenazineSE, Saphris  20 partial response but SE increase glucose , haloperidol EPS, Geodon EPS,  Vraylar, loxapine  SE, Caplyta  NR,  Latuda  160 good resp He's prone to EPS.  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 lithium  1500,   N-acetylcysteine with minimal benefit, ,  Modafinil  200 helped a  lot Amantadine with vomiting.  Remote Wellbutrin  years ago. Sertraline  and lexapro  sexual SE,   Propranolol  for tremor,   Ozempic  GI px   Review of Systems:  Review of Systems  Constitutional:  Positive for fatigue.  Gastrointestinal:  Positive for constipation and rectal pain.  Endocrine: Positive for polydipsia and polyuria.  Genitourinary:  Negative for decreased urine volume and frequency.  Musculoskeletal:  Positive for myalgias.  Skin:  Positive for rash.       Itching resolved with hydroxyzine   Neurological:  Negative for tremors.  Psychiatric/Behavioral:  Positive for decreased concentration and dysphoric mood. Negative for agitation, behavioral problems, confusion, hallucinations, self-injury, sleep disturbance and suicidal ideas. The patient is hyperactive.     Medications: I have reviewed the patient's current medications.  Current Outpatient Medications  Medication Sig Dispense Refill   Accu-Chek Softclix Lancets lancets Use as instructed to check blood sugar 4 times daily. DX:E11.65 300 each 3   Blood Glucose Monitoring Suppl (CONTOUR NEXT MONITOR) w/Device KIT Check sugar 4 times daily 1 kit 0   Blood Glucose Monitoring Suppl (FREESTYLE LITE) DEVI Use as instructed to check sugar 4 times daily 1 each 0   buPROPion   (WELLBUTRIN  XL) 150 MG 24 hr tablet Take 1 tablet (150 mg total) by mouth daily. 90 tablet 0   Clindamycin  Phos-Benzoyl Perox (ONEXTON) 1.2-3.75 % GEL Apply 1 Application topically in the morning. 50 g 4   cyclobenzaprine  (FLEXERIL ) 5 MG tablet Take 1 tablet (5 mg total) by mouth 3 (three) times daily as needed. 40 tablet 1   empagliflozin  (JARDIANCE ) 10 MG TABS tablet Take 1 tablet (10 mg total) by mouth daily before breakfast. 90 tablet 3   gemfibrozil  (LOPID ) 600 MG tablet Take 1 tablet (600 mg total) by mouth 2 (two) times daily before a meal. 180 tablet 3   glucose blood (ACCU-CHEK GUIDE) test strip USE AS INSTRUCTED TO CHECK BLOOD SUGAR 4 TIMES A DAY 300 strip 3   hydrOXYzine  (ATARAX ) 10 MG tablet TAKE 1 TABLET BY MOUTH 3 TIMES  DAILY AS NEEDED FOR ITCHING OR  INSOMNIA 270 tablet 0   insulin  aspart (NOVOLOG  FLEXPEN) 100 UNIT/ML FlexPen INJECT SUBCUTANEOUSLY 10 TO 30  UNITS IN THE MORNING AT NOON IN  THE EVENING AND AT BEDTIME 120 mL 2   insulin  glargine (LANTUS  SOLOSTAR) 100 UNIT/ML Solostar Pen Inject 25 Units into the skin daily.     Insulin  Pen Needle 32G X 4 MM MISC Use 5x a day 500 each 3   lurasidone  (LATUDA ) 80 MG TABS tablet TAKE 2 TABLETS BY MOUTH DAILY  WITH BREAKFAST 180 tablet 0   metFORMIN  (GLUCOPHAGE -XR) 500 MG 24 hr tablet TAKE 2 TABLETS BY MOUTH TWICE  DAILY WITH MEALS 360 tablet 2   NOVOFINE PLUS PEN NEEDLE 32G X 4 MM MISC USE 5X A DAY 500 each 3   rosuvastatin  (CRESTOR ) 10 MG tablet Take 1 tablet (10 mg total) by mouth daily. 90 tablet 3   Tazarotene  (ARAZLO ) 0.045 % LOTN Apply 1 Application topically at bedtime. Start by using it 2x per week for 1 month, then move to every other night 45 g 5   valsartan  (DIOVAN ) 160 MG tablet Take 1 tablet (160 mg total) by mouth daily. 90 tablet 3   modafinil  (PROVIGIL ) 200 MG tablet Take 1 tablet (200 mg total) by mouth daily. 30 tablet  5   No current facility-administered medications for this visit.    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 Recurs if cannot walk for a week.      TOBACCO ABUSE, HX OF 07/31/2009    Family History  Problem Relation Age of Onset   Diabetes Mother    Breast cancer Mother        was told not hereditary   Cirrhosis Mother        fatty liver   High blood pressure Mother    High blood pressure Father    Diabetes Father    Melanoma Maternal Uncle    Colon cancer Neg Hx    Stomach cancer Neg Hx    Pancreatitis Neg Hx    Heart disease Neg Hx    Kidney disease Neg Hx    Liver disease Neg Hx     Social History   Socioeconomic History   Marital status: Single    Spouse name: Not on file   Number of children: 0   Years of education: Not on file   Highest education level: Bachelor's degree (e.g., BA, AB, BS)  Occupational History   Not on file  Tobacco Use   Smoking status: Former    Current packs/day: 0.00    Average packs/day: 2.5 packs/day for 10.0 years (25.0 ttl pk-yrs)    Types: Cigarettes    Start date: 10/27/1996    Quit date: 10/27/2006    Years since quitting: 17.6   Smokeless tobacco: Never  Vaping Use   Vaping status: Never Used  Substance and  Sexual Activity   Alcohol use: Not Currently    Comment: none at all   Drug use: No   Sexual activity: Not on file  Other Topics Concern   Not on file  Social History Narrative   Single. Not dating currently. No kids.    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   Hobbies: video games PC   Caffeine: 2 C a day   Social Drivers of Corporate investment banker Strain: High Risk (09/09/2023)   Overall Financial Resource Strain (CARDIA)    Difficulty of Paying Living Expenses: Very hard  Food Insecurity: No Food Insecurity (09/09/2023)   Hunger Vital Sign    Worried About Running Out of Food in the Last Year: Never true    Ran Out of Food in the Last Year: Never true  Transportation Needs: No Transportation  Needs (09/09/2023)   PRAPARE - Administrator, Civil Service (Medical): No    Lack of Transportation (Non-Medical): No  Physical Activity: Sufficiently Active (09/09/2023)   Exercise Vital Sign    Days of Exercise per Week: 5 days    Minutes of Exercise per Session: 150+ min  Stress: No Stress Concern Present (09/09/2023)   [employer] of Occupational Health - Occupational Stress Questionnaire    Feeling of Stress : Not at all  Social Connections: Moderately Isolated (09/09/2023)   Social Connection and Isolation Panel    Frequency of Communication with Friends and Family: More than three times a week    Frequency of Social Gatherings with Friends and Family: More than three times a week    Attends Religious Services: Never    Database administrator or Organizations: Yes    Attends Banker Meetings: Never    Marital Status: Never married  Catering manager Violence: Not on file    Past Medical History, Surgical history, Social history, and Family history were reviewed and updated as appropriate.   Please see review of systems for further details on the patient's review from  today.   Objective:   Physical Exam:  There were no vitals taken for this visit.  Physical Exam Neurological:     Mental Status: He is alert and oriented to person, place, and time.     Cranial Nerves: No dysarthria.  Psychiatric:        Attention and Perception: Attention and perception normal.        Mood and Affect: Mood is anxious and depressed.        Speech: Speech normal. Speech is not rapid and pressured.        Behavior: Behavior is cooperative.        Thought Content: Thought content normal. Thought content is not paranoid or delusional. Thought content does not include homicidal or suicidal ideation. Thought content does not include suicidal plan.        Cognition and Memory: Cognition and memory normal.        Judgment: Judgment normal.     Comments: Insight intact Mildly intense style chronically.  CHRONICALLY loud.   No AIM noted     Lab Review:     Component Value Date/Time   NA 139 03/23/2024 1504   K 4.2 03/23/2024 1504   CL 104 03/23/2024 1504   CO2 24 03/23/2024 1504   GLUCOSE 115 (H) 03/23/2024 1504   BUN 17 03/23/2024 1504   CREATININE 1.25 03/23/2024 1504   CREATININE 1.24 06/18/2018 1502   CALCIUM  9.9 03/23/2024 1504   PROT 7.6 03/23/2024 1504   ALBUMIN 4.9 03/23/2024 1504   AST 22 03/23/2024 1504   ALT 21 03/23/2024 1504   ALKPHOS 67 03/23/2024 1504   BILITOT 0.5 03/23/2024 1504   GFRNONAA >60 03/09/2024 1144   GFRNONAA 84 04/02/2018 1551   GFRAA 97 04/02/2018 1551       Component Value Date/Time   WBC 5.8 03/23/2024 1504   RBC 5.10 03/23/2024 1504   HGB 14.5 03/23/2024 1504   HCT 42.7 03/23/2024 1504   PLT 190.0 03/23/2024 1504   MCV 83.8 03/23/2024 1504   MCH 28.2 03/09/2024 1144   MCHC 33.8 03/23/2024 1504   RDW 14.0 03/23/2024 1504   LYMPHSABS 1.6 03/23/2024 1504   MONOABS 0.5 03/23/2024 1504   EOSABS 0.1 03/23/2024 1504   BASOSABS 0.0 03/23/2024 1504    Lithium  Lvl  Date  Value Ref Range Status  09/09/2019 1.1 0.6 - 1.2  mmol/L Final    12/14/19 lithium  level 0.6 (trough 13 hours) , normal BMP and calcium    No results found for: PHENYTOIN, PHENOBARB, VALPROATE, CBMZ   .res Assessment: Plan:    Eastyn Dattilo was seen today for follow-up, anxiety, manic behavior and sleeping problem.  Diagnoses and all orders for this visit:  Bipolar I disorder, most recent episode (or current) manic (HCC) -     buPROPion  (WELLBUTRIN  XL) 150 MG 24 hr tablet; Take 1 tablet (150 mg total) by mouth daily.  Generalized anxiety disorder  Panic disorder with agoraphobia  Attention deficit hyperactivity disorder (ADHD), combined type -     modafinil  (PROVIGIL ) 200 MG tablet; Take 1 tablet (200 mg total) by mouth daily.  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   Mt has had significant disruptive mood and psychotic symptoms and substance abuse over the course of his treatment here.  It has been evident in chronic occupational dysfunction and some relational problems.  He is also been very prone to EPS and elevated blood sugars from atypicals making it difficult to get adequate mood stabilization.   Has a history of severe pancreatitis from Depakote.  He did not have an adequate response to carbamazepine.   lithium  was insufficient to control his symptoms in monotherapy and he developed diabetes insipidus.  There are no available mood stabilizers that do not have a warning of elevating blood sugar and cholesterol.  He has tried all the mood stabilizers that do not have this warning.  All of the available mood stabilizers left that have any effectiveness are antipsychotics and the FDA has required a class warning on all antipsychotics about blood sugar and cholesterol effects.  This is true despite the fact that not all antipsychotics are  equal and increasing these risks.  The patient's reading of these potential side effects is complicating finding an effective treatment.  The only way to prove whether or not these meds are elevating his blood sugar and cholesterol is to stop the medications for a period of time.  This exposes him to manic risk but there is no chance of finding an adequate mood stabilizer that will satisfy him until we can prove whether or not these meds are in fact affecting his blood sugar, cholesterol, blood pressure, and pulse.  Latuda   Been the best med so far for him.    Answered questions with no other good options obvious. Sleepiness was not better with reduction in Latuda  from 120 to 60 mg daily. Manic overactivity, reactivity is better with increase Latuda .  He has a low stress tolerance.  He can be easily agitated in public. Continue Latuda  160 mg PM.  Is getting more benefit.. .   Discussed potential metabolic side effects associated with atypical antipsychotics, as well as potential risk for movement side effects. Advised pt to contact office if movement side effects occur.   continue Hydroxyzine  10 mg HS prn  This has been helpful for anxiety and itching and may have some mild antimanic effect.  OK to continue with Medford Fischer for therapy  Counseling 25 min: Extensive discussion about his desire to pursue disability due to his difficulty dealing with the public and work situations.  He has had chronic problems  with work as previously noted.  Most of the time that he is involved difficulty with social interaction.  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  He has been passed over for potential promotions into managerial positions because he cannot handle that level of work performance.  We discussed the  risks and harms associated with this ability including the social isolation and inactivity which tend to make depression worse.  He feels that he does not have another option because of repeated job failures.  He has continued abstinence from marijuana is encouraged.  He has a history of cannabinoid hyperemesis which finally convinced him to discontinue the marijuana.  His mood disorder and thought disorganization have improved since being off the marijuana.  He has a very long history of heavy marijuana dependence.   Constipation management effective  Sleep hygiene. Disc OSA and how it affects alertness and started CPAP and much better.    ADD tx is reasonable bc job affected by difficulty with handling two tasks at once.  Forgetful and can't get back on task.  Hx Work restrictions bc of it.  No regular stimulant. Ok trial modafinil  off label for this problem.  Also to help OSA.  Much less likley to trigger psychosis than traditional stimulants. Continue DT marked benefit: Modafinil  200 mg AM.  Ok trial Wellbutrin  Xl 150 AM.  Disc risk SE esp with caffeine.  He wants to try it  FU 5 mos  Lorene Macintosh, MD, DFAPA  Please see After Visit Summary for patient specific instructions.  Future Appointments  Date Time Provider Department Center  06/21/2024  3:00 PM Bernardo Bruckner, [facility] None  09/05/2024  3:00 PM Cottle, Lorene KANDICE Raddle., MD [facility] None  09/06/2024  2:40 PM Norleen Lynwood ORN, MD [facility] None  09/07/2024  3:45 PM Alm Delon SAILOR, DO [facility] None  09/15/2024  3:00 PM Trixie File, MD [facility] None  12/15/2024  3:00 PM Lomax, Amy, NP [facility] None    No orders of the defined types were placed in this encounter.      -------------------------------

## 2024-06-16 ENCOUNTER — Other Ambulatory Visit: Payer: Self-pay | Admitting: Physician Assistant

## 2024-06-16 DIAGNOSIS — F902 Attention-deficit hyperactivity disorder, combined type: Secondary | ICD-10-CM

## 2024-06-16 DIAGNOSIS — G4733 Obstructive sleep apnea (adult) (pediatric): Secondary | ICD-10-CM

## 2024-06-17 NOTE — Telephone Encounter (Signed)
 Rx was sent at 8/14 appt with note to use [facility]. LF 7/28, due 8/25. Will cancel the current RF request.

## 2024-06-21 ENCOUNTER — Ambulatory Visit (INDEPENDENT_AMBULATORY_CARE_PROVIDER_SITE_OTHER): Admitting: Mental Health

## 2024-06-21 DIAGNOSIS — F3110 Bipolar disorder, current episode manic without psychotic features, unspecified: Secondary | ICD-10-CM

## 2024-06-21 DIAGNOSIS — G4733 Obstructive sleep apnea (adult) (pediatric): Secondary | ICD-10-CM | POA: Diagnosis not present

## 2024-06-21 NOTE — Progress Notes (Signed)
 [facility] Note  Name: Cody Short Date: 06/21/2024 MRN: [id number] DOB: Mar 08, 1976 PCP: Norleen Lynwood ORN, MD  Time spent: 51 minutes Time in: 3:00 p.m. time out 3: 51 p.m.  Treatment:   ind. Therapy  Virtual Visit via Telehealth Note Connected with patient by a telemedicine/telehealth application, with their informed consent, and verified patient privacy and that I am speaking with the correct person using two identifiers. I discussed the limitations, risks, security and privacy concerns of performing psychotherapy and the availability of in person appointments. I also discussed with the patient that there may be a patient responsible charge related to this service. The patient expressed understanding and agreed to proceed. I discussed the treatment planning with the patient. The patient was provided an opportunity to ask questions and all were answered. The patient agreed with the plan and demonstrated an understanding of the instructions. The patient was advised to call  our office if  symptoms worsen or feel they are in a crisis state and need immediate contact.   Therapist Location: office Patient Location: home     Mental Status Exam:    Appearance:    Casual     Behavior:   Appropriate  Motor:   WNL  Speech/Language:    Clear and Coherent  Affect:   Full range   Mood:   Euthymic   Thought process:   Logical, linear, goal directed  Thought content:     WNL  Sensory/Perceptual disturbances:     none  Orientation:   x4  Attention:   Good  Concentration:   Good  Memory:   Intact  Fund of knowledge:    Consistent with age and development  Insight:     Good  Judgment:    Good  Impulse Control:   Good     Reported Symptoms:  anxiety, rumination, intermittent depressed mood, impulsivity  Risk Assessment: Danger to Self:  No Self-injurious Behavior: No Danger to Others: No Duty to Warn:no Physical Aggression / Violence:No  Access to Firearms a  concern: No  Gang Involvement:No  Patient / guardian was educated about steps to take if suicide or homicide risk level increases between visits: yes While future psychiatric events cannot be accurately predicted, the patient does not currently require acute inpatient psychiatric care and does not currently meet [place]  involuntary commitment criteria.  Subjective:  Patient engaged in telehealth visit via video.  Assessed progress where he shared how he continues to set boundaries in the relationship with his brother.  He stated that he often will not respond to him when he tries to communicate with him via text message.  He stated his parents continue to try to set boundaries as well.  He stated that he has worked to reduce some of his anxiety and stress by trying to shift his focus.  He stated that at night at times he can ruminate, focusing on people who have hurt him in the past such as stealing from him or treating him disrespectfully.  He stated that he has been able to let go of some of the emotional distress associated, recognizing how he does not have to spend time questioning, wondering why they did what they did.  He went on to share how he continues to struggle thinking about girls that he tried to date in the past.  Continue to work with patient from a cognitive behavioral framework, facilitating his identifying self supportive thoughts and reframing thoughts toward reducing his tendency to ruminate.  Also identified was his need for companionship, his anxiety and hesitation about trying to make new friends or meet potential girlfriend.  Ways he could follow through such as utilizing dating apps is something he is considering. He continues to share how also the stress is reduced daily due to his discontinuing working.  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  Interventions:  motivational interviewing, CBT, supportive therapy  Diagnoses:    ICD-10-CM   1. Bipolar I disorder, most recent episode (or current) manic (HCC)  F31.10          Plan: Patient to utilize coping skills as discussed, continue his medication compliance to maintain mood stability. patient will continue to work on identifying and challenging his obsessive thoughts, particularly regarding relationships, and practicing grounding techniques to manage his anxiety. He will also be encouraged to celebrate his achievements at work and continue to take ownership of his successes.  Long-term goals:   Maintain symptom reduction: The patient will report sustained reduction in symptoms of anxiety using both CBT and mindfulness interventions for 3 consecutive months progressively.  Improve emotional regulation: The patient will learn and apply CBT and mindfulness-based strategies to regulate emotions, such as mindfulness-based stress reduction and cognitive restructuring, and report an improvement in emotional regulation for at least 3 consecutive months progressively.    Short-term goal:  The patient will learn and apply CBT and mindfulness-based coping skills for managing anxiety and practice using it between sessions.       2.   The patient will CBT and mindfulness-based interventions to increase awareness of negative thought patterns. 3.   The patient will keep and maintain employment to keep financial stress manageable       4.   The patient will decrease anxiety and stress when driving and dealing with family issues.         Lonni Fischer, [facility] why cannot talk get out of the client violence against a just 70 that message about him I think maybe we does way is the big decision and we may need to do it but did you check on  the

## 2024-06-25 ENCOUNTER — Other Ambulatory Visit: Payer: Self-pay | Admitting: Psychiatry

## 2024-06-25 DIAGNOSIS — F3110 Bipolar disorder, current episode manic without psychotic features, unspecified: Secondary | ICD-10-CM

## 2024-06-30 ENCOUNTER — Telehealth: Payer: Self-pay | Admitting: Psychiatry

## 2024-06-30 NOTE — Telephone Encounter (Signed)
 Received disability paperwork from lawyer office [employer].. Dr. Geoffry is filling it out and will tell me a charge

## 2024-07-10 ENCOUNTER — Encounter: Payer: Self-pay | Admitting: Internal Medicine

## 2024-07-12 MED ORDER — LANTUS SOLOSTAR 100 UNIT/ML ~~LOC~~ SOPN: 30.0000 [IU] | PEN_INJECTOR | Freq: Every day | SUBCUTANEOUS | 3 refills | Status: DC

## 2024-07-14 ENCOUNTER — Ambulatory Visit (INDEPENDENT_AMBULATORY_CARE_PROVIDER_SITE_OTHER): Admitting: Mental Health

## 2024-07-14 DIAGNOSIS — F3110 Bipolar disorder, current episode manic without psychotic features, unspecified: Secondary | ICD-10-CM | POA: Diagnosis not present
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
## 2024-07-22 ENCOUNTER — Other Ambulatory Visit: Payer: Self-pay | Admitting: Psychiatry

## 2024-07-22 DIAGNOSIS — L299 Pruritus, unspecified: Secondary | ICD-10-CM

## 2024-07-22 DIAGNOSIS — F3110 Bipolar disorder, current episode manic without psychotic features, unspecified: Secondary | ICD-10-CM

## 2024-07-24 ENCOUNTER — Encounter: Payer: Self-pay | Admitting: Internal Medicine

## 2024-07-24 DIAGNOSIS — E1165 Type 2 diabetes mellitus with hyperglycemia: Secondary | ICD-10-CM

## 2024-07-25 ENCOUNTER — Telehealth: Payer: Self-pay | Admitting: Psychiatry

## 2024-07-25 MED ORDER — NOVOFINE PLUS PEN NEEDLE 32G X 4 MM MISC: 3 refills | Status: DC

## 2024-07-25 NOTE — Telephone Encounter (Signed)
 LVM to [facility]. Previously reported elevated lipids. He was drinking a lot of energy/sugary drinks.

## 2024-07-25 NOTE — Telephone Encounter (Signed)
 Pt left vm over the weekend saying he thinks the depression meds are giving him high blood sugar readings and wants to know if he should stop taking the meds.

## 2024-07-26 NOTE — Telephone Encounter (Signed)
 Pt seen 8/14. Ok trial Wellbutrin  Xl 150 AM. Disc risk SE esp with caffeine. He wants to try it   He is reporting his blood sugars have been 192 all day long. He said you told him he could stop the Wellbutrin  if you wanted. He is asking to stop it and asking for something in its place.   Note mentions a discussion with him about all mood stabilizers having the possibility of BS and cholesterol elevation. His tone is a bit irritable today.

## 2024-07-27 ENCOUNTER — Encounter: Payer: Self-pay | Admitting: Dermatology

## 2024-07-27 NOTE — Telephone Encounter (Signed)
 Wellbutrin  does not elevate glucose.  But to prove it , he can stop Wellbutrin  and see if they change.  I don't want to add something new  until we establish what the effect there is from stopping Wellbutrin .    Give it a month off Wellbutrin  and tell us  how things are going.

## 2024-07-28 DIAGNOSIS — E119 Type 2 diabetes mellitus without complications: Secondary | ICD-10-CM | POA: Diagnosis not present

## 2024-07-28 DIAGNOSIS — Z1211 Encounter for screening for malignant neoplasm of colon: Secondary | ICD-10-CM | POA: Diagnosis not present

## 2024-07-28 NOTE — Telephone Encounter (Signed)
 Pt called to inquire about status of paperwork for disability lawyer. I see note that paperwork received on 9/4.

## 2024-07-28 NOTE — Telephone Encounter (Signed)
 FYI, I decided I need to talk to him some more before I address this paperwork.  I need to see him first.  He has an appt next month.  Let him know I want to see him in person if possible.

## 2024-07-28 NOTE — Telephone Encounter (Signed)
 Pt notified of recommendations. He had already stopped it before he called. Reports BS have returned to normal. He is asking about disability paperwork again that should be sent to lawyer. Will check on it and let him know.

## 2024-08-01 NOTE — Telephone Encounter (Signed)
 Pt notified that Dr. Geoffry wants to see him for another appt, preferably in person, to do the disability paperwork.   Please put patient on CXL.

## 2024-08-02 ENCOUNTER — Encounter: Payer: Self-pay | Admitting: Internal Medicine

## 2024-08-02 DIAGNOSIS — E1165 Type 2 diabetes mellitus with hyperglycemia: Secondary | ICD-10-CM

## 2024-08-02 MED ORDER — NOVOFINE PLUS PEN NEEDLE 32G X 4 MM MISC: 3 refills | Status: DC

## 2024-08-02 NOTE — Telephone Encounter (Signed)
 Lvm for pt to call bacck  to make his appt in person and hopefully get in  sooner

## 2024-08-08 ENCOUNTER — Encounter: Payer: Self-pay | Admitting: Psychiatry

## 2024-08-08 ENCOUNTER — Encounter: Payer: Self-pay | Admitting: Internal Medicine

## 2024-08-08 ENCOUNTER — Ambulatory Visit (INDEPENDENT_AMBULATORY_CARE_PROVIDER_SITE_OTHER): Admitting: Psychiatry

## 2024-08-08 DIAGNOSIS — F5105 Insomnia due to other mental disorder: Secondary | ICD-10-CM

## 2024-08-08 DIAGNOSIS — F411 Generalized anxiety disorder: Secondary | ICD-10-CM | POA: Diagnosis not present

## 2024-08-08 DIAGNOSIS — E1165 Type 2 diabetes mellitus with hyperglycemia: Secondary | ICD-10-CM

## 2024-08-08 DIAGNOSIS — F3110 Bipolar disorder, current episode manic without psychotic features, unspecified: Secondary | ICD-10-CM

## 2024-08-08 DIAGNOSIS — F4001 Agoraphobia with panic disorder: Secondary | ICD-10-CM

## 2024-08-08 DIAGNOSIS — G4733 Obstructive sleep apnea (adult) (pediatric): Secondary | ICD-10-CM | POA: Diagnosis not present

## 2024-08-08 DIAGNOSIS — F902 Attention-deficit hyperactivity disorder, combined type: Secondary | ICD-10-CM

## 2024-08-08 MED ORDER — ACCU-CHEK GUIDE TEST VI STRP: ORAL_STRIP | 12 refills | Status: AC

## 2024-08-08 MED ORDER — NOVOFINE PLUS PEN NEEDLE 32G X 4 MM MISC: 3 refills | Status: DC

## 2024-08-08 NOTE — Progress Notes (Signed)
 Cody Short [id number] [birth year] 48 y.o.    Subjective:   Patient ID:  Cody Short is a 48 y.o. (DOB 01-13-1976) male.  Chief Complaint:  Chief Complaint  Patient presents with   Follow-up   Manic Behavior   Anxiety   Medication Reaction   Stress    Lost job     [employer] presents to the office today for follow-up of several psychiatric diagnoses ...    seen August 2020.  For obsessive anxiety  Switched Lexapro  5 mg daily on May 18.  But he stated it made him hungry and so it was stopped in August.  He continued on Saphris  10 mg nightly, lithium  1200 mg daily, propranolol  as needed tremor.  Lithium  level was ordered.  He called Sept saying he was manic.  Had a lithium  level 0.7 and so dose was increased to 1500 mg daily.    seen December 21, 2019.  He had some ongoing manic symptoms and Saphris  was increased to 15 mg nightly.  02/07/2020 appointment with the following noted: He has made multiple phone calls to the office since that date.  He decided he wanted to stop lithium  because of urinary problems.  He was warned this could lead to mania but he wanted to pursue it anyway.  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  [name] had been having bedwetting episodes which she attributed to the lithium .  He has not consistently taken Saphris  15 mg at bedtime since the office visit in February. He completely stopped lithium  January 30, 2020 U frequency is much better and less thirsty.  Bedwetting resolved.  Reduced BM to 1-2 times daily.  Gassy.  Glucose is high.  B is hospitalized with unknown disease.  Insulin  adjusted. Mental status is best it's ever been.  Less mania.  Less obsessing.  No worsening mania. Is taking Saphris  15 mg HS> Plan no med changes.  04/10/2020 appointment with the following noted: No longer on lithium .  Still frequent urination and thirst DT DM poorly controlled despite meds.   Endo says little else to take bc history of pancreatitis.  Says he goes to urinate q 15 mins. Plan: Wean Saphris  in crossover to loxapine  150 mg HS over 2 weeks bc he and endocrinologist believe it's likely Saphris  is cause of the inability to get his glucose to acceptable levels.   06/07/20 appt with the following noted: He's called a couple of times CO sleepiness and reduced loxapine  to 50 mg HS. Started Vegan diet and BS is better 4 weeks ago.  Thinks glucose is better off the Saphris . Lost 6 #.   CO sleepiness and sleep 16 hours   He thinks it also drove up pulse and blood pressure. Asks about taking Saphris  10 and a second medication. Mood has been fine.   Plan: He wants to restart Saphris  10 mg HS and use a 2nd mood stabilizer. Start Saphris  and reduce Loxapine   To 10 mg at night DT prior failure of 10 Mg Saphris  with lithium .  06/27/2020 phone call from patient: Pt called to report Loxapine  & Saphris  is causing his BP to be very high and Saphris  is causing blood sugar to be high. Apt 10/18. Need changes before then. Call back @ [phone]  MD response: He had wanted to go back on Saphris  and reduce loxapine  which we did.  Tell him to stop the Saphris  and increase loxapine  to 20 mg nightly.  He did not tolerate 50 mg of loxapine  very well. Nurse TC withpt: Spoke with [name] and he will stop the Saphris , but feels the increase in the Loxapine  to 20 mg will increase his blood sugars more. Explained that with coming off the Saphris  he needs to increase it a bit or he will not be stable with his moods. Advised him to stop the Saphris  and would check with Dr. Geoffry about any other recommendations.   07/17/20 appt with the following noted: Doing terrible.  Needs melatonin to sleep.  Claims loxapine  is elevating BP, pulse, TG, cholesterol, glucose.  Claimed Saphris  did it too.   Sees PCP at end of October.   Started Vegan diet about 6 weeks ago and exercising.   A/P: There are no available mood  stabilizers that do not have a warning of elevating blood sugar and cholesterol.  He has tried all the mood stabilizers that do not have this warning.  All of the available mood stabilizers left that have any effectiveness are antipsychotics and the FDA has required a class warning on all antipsychotics about blood sugar and cholesterol effects.  This is true despite the fact that not all antipsychotics are equal at increasing these risks.  The patient's reading of these potential side effects is complicating finding an effective treatment.  The only way to prove whether or not these meds are elevating his blood sugar and cholesterol is to stop the medications for a period of time.  This exposes him to manic risk but there is no chance of finding an adequate mood stabilizer that will satisfy him until we can prove whether or not these meds are in fact affecting his blood sugar, cholesterol, blood pressure, and pulse. Therefore he will stop the loxapine  which is at a low dose. We will check labs in 3 weeks.  We will attempt follow-up in 4 weeks.  If he gets manic he will let us  know.   08/13/2020 appointment with the following noted: Blood sugar is pretty much the same off the loxapine  but BP and pulse improved from 110 to 70 range.  But had started BP med about mid September. Started counselor Cheryal Sauer at [facility] and started nutritionsist.  Has changed and restricted diet. Sees PCP 07/24/20.  Endo is managing DM.   Mood has been really good.  Counseling helped the anxiety.  Getting 10 hours of sleep.  PCP suggested changing time going to bed earlier and it's helping.   Sometimes has racing thoughts and gets comments from others while playing video games that he needs to take a break. Patient reports stable mood and deniesr irritable moods.  Denies appetite disturbance.  Patient reports that energy and motivation have been good.  Patient denies any difficulty with concentration.   Patient denies any suicidal ideation.  Admits he's easily stressed.   10/09/20 appt with following noted: Getting manic as hell and depressed as hell. Talking too much and obsessing over things.  Talking too loud and doing things extremely fast.  Only depressed a couple of days ago.  Manic sx are brief and not all the time. Sleep 7-8 hours and sleep schedule is more normal. Stayed on Caplyta  and pleased overall with SE. BS is better but hypertension is ongoing. Low TSH. Still counseling. No delusions hallucinations since off drugs. Give samples of Caplyta  42 mg once daily. Prefer no change befor ethe holidays bc he is not continuously manic or depressed. Likely switch to Latuda  after the  holidays.  11/19/2020 appointment with the following noted: Best I've ever been except some controllable manic.  Able to do things fast and easily.  Anxiety medicine and counseling has helped.  Better function.  No SE. Not depressed in a while.  But not sure the Capylta helps mania.   Lost from 230# to 189# with diet and exercise over a long period.   Sleep 4-8 hours, but not that I can't sleep but don't feel like sleeping.  Not itching any more.  I'm a a whole new person.   I can't keep up the mania.  Driving my parents and friends crazy. Blood sugar better and off insulin .  Only taking metformin . Can't afford Caplyta . So feels he must change. Hydroxyzine  really helped anxiety. Plan: DC Caplyta  DT mania.  switch to Latuda  40 then 80 mg with food.  Pick up samples.  12/03/2020 phone call from parents:Parents called and want dr. geoffry to know that deward is maniac and he is having physical aggression and all he does is play video games 24 hours a day and is not sleeping. Parents feel like deward is not giving you the whole picture and needs an adjustment to his medicine. MD response:atient acknowledged that his appointment on January 24 that he was manic while taking Caplyta .  He also acknowledged that his family  saw him as being manic.  We discontinued Caplyta  and switched him to Latuda .  If he made the switch and increase the dose in a timely manner he should have been on 80 mg daily for a week now.  His parents are complaining that he is still manic.  Provided he is tolerating the Latuda  reasonably well have him increase to 120 mg daily.  He can take 1-1/2 of the 80 mg tablets until he runs out and then we can send in a refill for 120 mg daily.  Remind him he needs to take that with at least 350 cal. Phone call with patient from nurse: Patient rtc and he did report he's doing great on the medication but is still having mania. Reports staying up all night playing video games then asleep about 5 am and back up at noon. He is taking medication with 350 calories also. Advised him to increase to 120 mg Latuda  daily. He reports having only 40 mg tablets at home so instructed him to take 3 tablets and I would get more samples out for him to pick up tomorrow. He agreed and verbalized understanding.  12/07/2020 phone call: Patient called again about his Latuda . He states that it makes him really hungry after he takes it, he takes a nap and wakes up hungry again. He would like to know would it be okay to take before bed instead. MD response: He is unlikely to comply with recommendations about routine sleep and wake times.  He is in a habit of playing video games late into the night.  If he prefers he can split the dosage of the Latuda  between breakfast and another meal.  But it is very important that he get the full dose in every day and take it with 350 cal. Nursing phone call with patient:Rtc to patient and he reports he has to take it all at hs because it makes him sleepy, I advised him to continue that regimen. He also reports not sleeping well but he's been drinking caffeine later in the day. Advised him to not drink any caffeine past 2-3 pm. He agreed. He reports his blood  sugars have been elevated some, not as bad but it  is up some.   12/20/2020 appointment with the following noted: Mania tremendously better but occ anger outbursts anger.  6 hours of sleep with melatonin, hydroxyzine  and Latuda  120 mg for 2 weeks.  Staying up all night playing video games. Latuda  does make him hungry right after he takes it so takes it before bedtime.  Taking with food. Hyperactivity and loud talking is better.  I can function fast.  Parents see benefit with mania but are concerned about his anger outbursts but he things she takes stress out on him.  Blood sugar is generally good except when eats too much.  Taking it with dinner and HS.   Able to drive around town.  Can cook and clean.  Less ruminating on his past.  Less need for direction.   Counseling is helping at [facility] counseling. Hydroxyzine  helped itching and anxiety.  Plan: Continue Latuda  120 mg PM.  Is getting benefit but only been on it for a couple of weeks.   01/17/2021 appointment with following noted: So so.  Some anger outburts a couple of times.  Eats too much.  Blood sugar is pretty bad.  Up at night playing video games and then sleeps too late in the day.  Takes Latuda  at evening meal.  Wants to blame meds for these behaviors. Otherwise mood is good I guess.  No serious highs or lows.  Can get in arguments with mother when she tries to correct him and may feel guilty afterwards. Takes hydroxyzine  q 6 hours because of anxiety and bc dx thyroid  inflamed, thyroiditis.  Asks if there's anything to help with overeating. Lost weight from thyroid .   Plan: Continue Latuda  120 mg as he is tolerating it and it appears to be controlling mania better than did the Caplyta  and he is tolerating it better.  02/12/2021 appointment with the following noted: Perfectly good with mood and depression.  Needs hydroxyzine  for itching and anxiety.  Energy poor with thyroiditis. Will sleep too much too bc gets bored and no physical activity. Varies 9-16 hours daily.  Can nap  easily.  Not spacy.   No anger lately. Tolerating Latuda  well.   Still eats too many sweets late at night and gets high blood sugars.   Plan: Increase  topiramate  50 mg BID off label for weight loss and disc SE.  He wants to try something.  03/20/2021 appointment with the following noted: Taking meds.  A little benefit with increase topiramate . Mood is perfectly fine without mania nor depression. Blood sugar is a bit high. Claims doctor thinks rash could be related to Latuda  but he sleeps all the time bc hypothyroidism. Has folliculitis Started treatment for it and it is helping.   Thyroid  problem is a wait and see.  Could take a year to get better. Doesn't think topiramate  added tiredness. Not taking hydroxyzine  much about once daily. Dealing with difficult GM threatening sui if left alone.  Plan: Continue Latuda  120 mg PM.  Is getting benefit.. Better tolerated than others so far but he does say it makes him hungry but he has chronic impulse control problems including overeating.  05/06/2021 appointment with the following noted: Quit topiramate  DT nausea and not helping at all. Rash resolved. Lipids not terrible but a little up.  TSH normalized. HGBA1C 9.3 Not exercising. Not doing much re: work etc other than video games. Not manic lately. Patient reports stable mood and denies depressed or  irritable moods.  Patient denies any recent difficulty with anxiety.   Denies appetite disturbance.  Patient reports that energy and motivation have been good.  Patient denies any difficulty with concentration.  Patient denies any suicidal ideation. Energy is better than it was but still sleeping 12 hours daily and up at night playing videos with friends. Plan: Reduce Latuda  to 80 mg to reduce excessive sleepiness.  If mania then increase it back up 120 mg PM.  Is getting benefit..  06/20/21 TC:  Pt stated the cramps have spread to his arms and legs.He has been on latuda  80 mg qd for awhile now.He  stated he is also going to see a cardiologist due to heart spasms. MD response:  The cramps are not likely related to Latuda .  Usually it is related to dehydration although I see from chart review that he had elevated creatinine kinase from a statin and that was probably causing some of his problem.  He needs to make sure he drinks lots of water.  Especially because he is diabetic and that will make him prone to dehydration if he is not controlling his blood sugar. However the sleepiness might be related to Latuda .  He had previously been on 120 mg daily and we reduced it to 80 mg daily and if he is done okay with his mood we can try reducing it 1 more time to 60 mg.  I will send in a prescription for the 60 mg tablet and he can reduce it if he would like to see if the sleepiness is related to Latuda .  If Latuda  is contributing to sleepiness it will get better if he reduces the dose.  If the sleepiness does not get better with the dosage reduction then Latuda  is not the cause Reduce Latuda  to 60 mg daily  07/30/2021 appt noted: Not much difference in alertness Mania since reducing Latuda . Hypoactive bc thryroid problems and may be the cause of the cramps per the other doctors. Itches badly without hydroxyzine . Spent $500 on gambling game and adeleted.  Past history of drug induced gambling but no major drugs in 15 years. Still exhausted and sleep all day.  But is taying up at night.  Sleeping 16 hours daily.  Wonders if hydroxyzine  25 is making him tired. Itching worse with stress and stress of GM and B crazy. Asks about bipolar shot. Side sleeper and as far as he knows no excessive snoring.  08/23/2021 phone call from patient's father stating that [name] had gone off the rails and it spent $1000 on the night prior doing online gambling and had gone back to staying up all night and sleeping during the day.  He had also been more verbally abusive.  It was suggested that the Latuda  dose be increased from  60 to 80 mg in the evening.  10/09/2021 appointment with the following noted: Sleeping a lot and easily exhausted even after med were changed.   SE a little constipation. History of problems with gambling in the past but not current.  Not thinking of it now. No other manic sx currently.   On Latuda  80 daily now.  Reduced hydroxyzine  to 10 and still sleepy.  No other psych meds Plan: Continue Latuda  80 mg bc manic with less .  If recurrence of mania then increase it back up 120 mg PM.  Is getting benefit..  11/18/2021 phone call: Patient had complained of elevated CK levels and wondered if it could be related to Latuda .  Dr. Geoffry called Dr. Waylan and discussed this issue.  It appears that his CK levels have actually been declining over the last several months and are not at a critical level.  It was agreed that this could be monitored over time and no immediate med change was necessary.  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   1 episode when got angy and said awful things to mother but then apologized.  Only 1 bad day. Otherwise mood has been pretty stable lately. Constipation and can't take miralax bc gives him diarrhea.   Sleep normalized to 8 hours since started his job.  So the med was not the source of the sleepiness. Counselor next Tuesday Only hydroxyzine  is 10 mg HS Only other psych med is Latuda  80 mg daily Plan: Latuda   Been the best med so far for him.     Sleepiness was not better with reduction in Latuda  from 120 to 60 mg daily. Continue Latuda  80 mg bc manic with less .  Option split dose to help sleepiness .   03/12/22 appt noted: Taking hydroxyzine  BID Sleep study next  week. 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  At [employer]. Doing well emotionally. Constipation and GI doc tomorrow DT fissure. BS is better A1C 7.4 instead of 9+ Sleep 8 hours.   The only thing he doesn't like is 4 meals daily bc hard to lose weight. Not much mania nor depression. Still anxious and worse without hydroxyzine . Consistent with Latuda  at bedtime with food.   Rash bettter .  Itching managed with BID hydroxyzine . Plan: Continue Latuda  80 mg bc manic with less .  Option split dose to help sleepiness .  If recurrence of mania then increase it back up 120 mg PM.  Is getting benefit..  06/23/22 appt noted: Rough 2 weeks.  Clemens for 46 yo girl at work and was somewhat manic with her and she quit the job.  But is beginning to feel better now. Dx OSA CPAP and tremendous difference.  Not sleeping as much and more productive at work.  But still some initial sleepiness in the AM Not constipation or diarrhea but slow to move bowels but takes several times in the AM. Started Colace  2 daily for the last couple of days. Lost 10-15# and now stagnant. Working and eating less and weights off and on 3 weeks and trouble losing weight. At work asking too many questions and is too anxious.  Wonders about more hydroxyzine . Sober for 12 years. Can't split Latuda  bc gets too sleepy and takes it too much at night. Had same job 6 mos.  Got employee of the month. Plan: no changes  07/08/22 tC CO more manic sx 07/30/22 TC: Pt stated generic latuda  is not working for him.He is having manic symptoms such as cleaning his whole house,getting into arguments with his mom and getting agitated very easily.He also gambled $300.He is taking 80 mg  MD response : incr Latuda  to 120 mg daily  09/25/22 appt noted: It made all the difference in the world with incr Latuda  to 120 mg daily.  No sig mania or depression now. Increased stool softeners to 3  twice daily.  BS better with Jardiance  and wt loss  19#. Tolerating Latuda .   No delusions or visions like before and no trouble some intrusive thoughts. Rash is better with clindamycin  solution. Busy with work.  [employer] employee of the month twice at [employer].  37 hours per month. Sleep 8-9 hours. Hydroxyzine  10 HS helps sleep and can't take in daytime. SE can't lose wt on Latuda  with medical help. Plan no med changes  02/04/23 TC: Patient is taking hydroxyzine  and Latuda . He says he takes as prescribed, taking with enough calories. There was a work situation that caused a lot of stress and he was able to talk to someone to calm him down and provide clarity on the situation. He said that he spent $1000 on a gambling game that didn't even provide any pay out. He also said he got into an argument with 2 different customers and that he doesn't usually do this. He is also describing depression. He said he is sleeping 9 hours a day. Patient does not have pressured or rapid speech, seems to have a calm affect, but is concerned.     MD resp:  I'm out of town.  Only option I can give now is to increase the Latuda  to 1 and 1/2 of the 120 mg tabs temporarily until mania resolves     02/04/23 appt noted: in person Increased Latuda  to 180 mg daily since ehre bc manic sx. Hydroxyzine  10 HS hangover so taking 10 HS Now also Lautda 120 mg daily. Doing good overall.  No sig mania lately. Few days mild dep but not bad.   Some constipation issues.  Takign stool softeners.   Therapy with [facility] is helping a lot. Wonders if Latuda  speeds up sex response.  No compulsive sex behaviors. Lost $7000 on gambling game.  Got banned from game and no other gambling problems now.   B drug addict and causes problems and asking for money and lying.   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   Sleep 8 hours.   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 Accutane several mos.  Rash 90% better. Constipation better with stool softeners. Not much mood swings but dep occ.  7-8 hours sleep. Uses hydroxyzine  for anxiety bc it remains high.  Too tired if more than 20 mg at a time. Meds Latuda  120 mg daily and hydroxyzine  10 BID.   Might have to go on Medicaid bc income reasons. Therapy with Medford Fischer helped more than any other therapist.   Covid shut down Dad's business. B returned to drug use.  He's neglecting his kid.  B is big stress.   GM died 4 mos ago.   Can be impulsive and prone to addiction. Plan: ADD tx is reasonable bc job affected by difficulty with handling two tasks at once.  Forgetful and can't get back on task.  Work restrictions bc of it.  No regular stimulant. Ok trial modafinil  off label for this problem.  Also to help OSA. Modafinil  100-then 200 mg AM.  09/09/23 appt noted: Modafinil  is the best thing that ever happened to me.  Better focus, alertness, and function.  ADD completely fixed.  More productive and boss noticed.   No SE.   Flare accutane causing pancreatitis.  Went to ER.  Scared of it and that it might be fatal.   Constipation  resolved.   Lost 30# in 6 mos. CPAP is better and sleep better.  Only 6-7 hours of sleep and was 8-9 hours.   Change to MCD 09/27/23.   Changing only PCP and keeping other doctors.   Better with wt loss and glucose now normal.  Dieting and exercise.  Losing more wt.   Psych meds: modafinil  200, Lurasidone  120 daily. Hydroxyzine  10 mg prn,   12/10/23 appt noted: Med: Latuda  160 mg daily, modafinil  200 mg AM Raised Latuda  to 160 mg daily. Dealing with family and customers a million times better.  Mood overall is better with work and family.  My whole life is better with increase Latuda .  Less lashing out at people. B is a ongoing problem at home. Cognitive distortions.   Working on those. SE some wt gain.   Stay up late if no work.  If works then 8 hour sleep.  Will  nap if sleep deprived.  Using CPAP more.  Had FU yesterday and doing well with. Still loves the modafinil  for focus and alertness.   B using crack and threatens them if not giving him money.   06/09/24 appt noted:  Med: Latuda  160 mg daily, modafinil  200 mg AM, hydroxyzine   20 HS Doing alright but walked out on job bc can't deal with public.  Last work date around first of May.  I can't handle it at all.  Low tolerance dealing with the public.  Will yell at people.  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  Wants to pursue disability.  Has attorney with [employer].  Doesn't think he could work any other job. Some spending px but not gambling px lately. Dep a lot worse at night.  Napping too much.  Stays up at night. 2 hydroxyzine  helps sleep and anxiety. B driving them crazy.   No problems with meds.  No drug use.  Hard to lose wt bc needs to take Latuda  late in the day.  3-6 cups coffee daily and sometimes energy drinks. Plan: Ok trial Wellbutrin  Xl 150 AM.  Disc risk SE esp with caffeine.  He wants to try it  08/08/24 appt noted:  Med: Latuda  160 mg daily, modafinil  200 mg AM, hydroxyzine   20 HS, Wellbutrin  stopped Thinks wellbutrin  elevated his blood sugar and it got better when he stopped Wellbutrin . Helped his dep only a little.   Still gets bipolar mood swings every night when gets dep about his life.  Mind will race on things that happened a long time ago.  Making him more depressed.   Stays up all night streaming video games and talks all night to people.  Then will sleep 8 hours and get up and eat and sleep about 2-3 hours.   Stress taking care of brother's 73 yo son.  The mother in rehab and B poor function. Working in therapy on family issues.  B still abusing drugs and seeking $ from them for drugs.   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   Prior psychiatric medications: risperidone with cognitive side effects, Abilify NR,  Zyprexa SE glucose, Seroquel 600 mg SE wt, perphenazineSE, Saphris  20 partial response but SE increase glucose , haloperidol EPS, Geodon EPS,  Vraylar, loxapine  SE, Caplyta  NR,  Latuda  160 good resp He's  prone to EPS.  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 lithium  1500,   N-acetylcysteine with minimal benefit, ,  Wellbutrin  SL 150 he thought it elevated BS Modafinil  200 helped a  lot Amantadine with vomiting.  Remote Wellbutrin  years ago. Sertraline  and lexapro  sexual SE,   Propranolol  for tremor,   Ozempic  GI px   Review of Systems:  Review of Systems  Constitutional:  Positive for fatigue.  Gastrointestinal:  Positive for constipation and rectal pain.  Endocrine: Positive for polydipsia and polyuria.  Genitourinary:  Negative for decreased urine volume and frequency.  Musculoskeletal:  Positive for myalgias.  Skin:  Positive for rash.       Itching resolved with hydroxyzine   Neurological:  Negative for tremors and weakness.  Psychiatric/Behavioral:  Positive for decreased concentration and dysphoric mood. Negative for agitation, behavioral problems, confusion, hallucinations, self-injury, sleep disturbance and suicidal ideas. The patient is hyperactive.     Medications: I have reviewed the patient's current medications.  Current Outpatient Medications  Medication Sig Dispense Refill   Accu-Chek Softclix Lancets lancets Use as instructed to check blood sugar 4 times daily. DX:E11.65 300 each 3   Blood Glucose Monitoring Suppl (CONTOUR NEXT MONITOR) w/Device KIT Check sugar 4 times daily 1 kit 0   Blood Glucose Monitoring Suppl (FREESTYLE LITE) DEVI Use as instructed to check sugar 4 times daily 1 each 0   Clindamycin  Phos-Benzoyl  Perox (ONEXTON) 1.2-3.75 % GEL Apply 1 Application topically in the morning. 50 g 4   cyclobenzaprine  (FLEXERIL ) 5 MG tablet Take 1 tablet (5 mg total) by mouth 3 (three) times daily as needed. 40 tablet 1   empagliflozin  (JARDIANCE ) 10 MG TABS tablet Take 1 tablet (10 mg total) by mouth daily before breakfast. 90 tablet 3   gemfibrozil  (LOPID ) 600 MG tablet Take 1 tablet (600 mg total) by mouth 2 (two) times daily before a meal. 180 tablet 3   glucose blood (ACCU-CHEK GUIDE) test strip USE AS INSTRUCTED TO CHECK BLOOD SUGAR 4 TIMES A DAY 300 strip 3   hydrOXYzine  (ATARAX ) 10 MG tablet TAKE 1 TABLET BY MOUTH 3 TIMES  DAILY AS NEEDED FOR ITCHING OR  INSOMNIA 90 tablet 2   insulin  aspart (NOVOLOG  FLEXPEN) 100 UNIT/ML FlexPen INJECT SUBCUTANEOUSLY 10 TO 30  UNITS IN THE MORNING AT NOON IN  THE EVENING AND AT BEDTIME 120 mL 2   insulin  glargine (LANTUS  SOLOSTAR) 100 UNIT/ML Solostar Pen Inject 30 Units into the skin daily. 30 mL 3   Insulin  Pen Needle (NOVOFINE PLUS PEN NEEDLE) 32G X 4 MM MISC Use 5x a day 500 each 3   Insulin  Pen Needle 32G X 4 MM MISC Use 5x a day 500 each 3   lurasidone  (LATUDA ) 80 MG TABS tablet TAKE 2 TABLETS BY MOUTH DAILY  WITH BREAKFAST 60 tablet 2   metFORMIN  (GLUCOPHAGE -XR) 500 MG 24 hr tablet TAKE 2 TABLETS BY MOUTH TWICE  DAILY WITH MEALS 360 tablet 2   modafinil  (PROVIGIL ) 200 MG tablet Take 1 tablet (200 mg total) by mouth daily. 30 tablet 5   rosuvastatin  (CRESTOR ) 10 MG tablet Take 1 tablet (10 mg total) by mouth daily. 90 tablet 3   Tazarotene  (ARAZLO ) 0.045 % LOTN Apply 1 Application topically at bedtime. Start by using it 2x per week for 1 month, then move to every other night 45 g 5   valsartan  (DIOVAN ) 160 MG tablet  Take 1 tablet (160 mg total) by mouth daily. 90 tablet 3   buPROPion  (WELLBUTRIN  XL) 150 MG 24 hr tablet Take 1 tablet (150 mg total) by mouth daily. (Patient not taking: Reported on 08/08/2024) 90 tablet 0   No current facility-administered medications  for this visit.    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 Recurs if cannot walk for a week.      TOBACCO ABUSE, HX OF 07/31/2009    Family History  Problem Relation Age of Onset   Diabetes Mother    Breast cancer Mother        was told not hereditary   Cirrhosis Mother        fatty liver   High blood pressure Mother    High blood pressure Father    Diabetes Father    Melanoma Maternal Uncle    Colon cancer Neg Hx    Stomach cancer Neg Hx    Pancreatitis Neg Hx    Heart disease Neg Hx    Kidney disease Neg Hx    Liver disease Neg Hx     Social History   Socioeconomic History   Marital status: Single    Spouse name: Not on file   Number of children: 0   Years of education: Not on file   Highest education level: Bachelor's degree (e.g., BA, AB, BS)  Occupational History   Not on file  Tobacco Use   Smoking status: Former    Current packs/day: 0.00    Average packs/day: 2.5 packs/day for 10.0 years (25.0 ttl pk-yrs)    Types: Cigarettes    Start date: 10/27/1996    Quit date: 10/27/2006    Years since quitting: 17.7   Smokeless tobacco: Never  Vaping  Use   Vaping status: Never Used  Substance and Sexual Activity   Alcohol use: Not Currently    Comment: none at all   Drug use: No   Sexual activity: Not on file  Other Topics Concern   Not on file  Social History Narrative   Single. Not dating currently. No kids.    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   Hobbies: video games PC   Caffeine: 2 C a day   Social Drivers of Corporate investment banker Strain: High Risk (09/09/2023)   Overall Financial Resource Strain (CARDIA)    Difficulty of Paying Living Expenses: Very hard  Food Insecurity: No Food Insecurity (  09/09/2023)   Hunger Vital Sign    Worried About Running Out of Food in the Last Year: Never true    Ran Out of Food in the Last Year: Never true  Transportation Needs: No Transportation Needs (09/09/2023)   PRAPARE - Administrator, Civil Service (Medical): No    Lack of Transportation (Non-Medical): No  Physical Activity: Sufficiently Active (09/09/2023)   Exercise Vital Sign    Days of Exercise per Week: 5 days    Minutes of Exercise per Session: 150+ min  Stress: No Stress Concern Present (09/09/2023)   [employer] of Occupational Health - Occupational Stress Questionnaire    Feeling of Stress : Not at all  Social Connections: Moderately Isolated (09/09/2023)   Social Connection and Isolation Panel    Frequency of Communication with Friends and Family: More than three times a week    Frequency of Social Gatherings with Friends and Family: More than three times a week    Attends Religious Services: Never    Database administrator or Organizations: Yes    Attends Banker Meetings: Never    Marital Status: Never married  Catering manager Violence: Not on file    Past Medical History, Surgical history, Social history, and Family history were reviewed and updated as appropriate.   Please see review of systems for  further details on the patient's review from today.   Objective:   Physical Exam:  There were no vitals taken for this visit.  Physical Exam Neurological:     Mental Status: He is alert and oriented to person, place, and time.     Cranial Nerves: No dysarthria.  Psychiatric:        Attention and Perception: Attention and perception normal.        Mood and Affect: Mood is anxious and depressed. Affect is not tearful.        Speech: Speech normal. Speech is not rapid and pressured.        Behavior: Behavior is cooperative.        Thought Content: Thought content normal. Thought content is not paranoid or delusional. Thought content does not include homicidal or suicidal ideation. Thought content does not include suicidal plan.        Cognition and Memory: Cognition and memory normal.        Judgment: Judgment normal.     Comments: Insight intact Mildly intense style chronically.  Chronically loud.   No AIM noted     Lab Review:     Component Value Date/Time   NA 139 03/23/2024 1504   K 4.2 03/23/2024 1504   CL 104 03/23/2024 1504   CO2 24 03/23/2024 1504   GLUCOSE 115 (H) 03/23/2024 1504   BUN 17 03/23/2024 1504   CREATININE 1.25 03/23/2024 1504   CREATININE 1.24 06/18/2018 1502   CALCIUM  9.9 03/23/2024 1504   PROT 7.6 03/23/2024 1504   ALBUMIN 4.9 03/23/2024 1504   AST 22 03/23/2024 1504   ALT 21 03/23/2024 1504   ALKPHOS 67 03/23/2024 1504   BILITOT 0.5 03/23/2024 1504   GFRNONAA >60 03/09/2024 1144   GFRNONAA 84 04/02/2018 1551   GFRAA 97 04/02/2018 1551       Component Value Date/Time   WBC 5.8 03/23/2024 1504   RBC 5.10 03/23/2024 1504   HGB 14.5 03/23/2024 1504   HCT 42.7 03/23/2024 1504   PLT 190.0 03/23/2024 1504   MCV 83.8 03/23/2024 1504   MCH 28.2 03/09/2024 1144  MCHC 33.8 03/23/2024 1504   RDW 14.0 03/23/2024 1504   LYMPHSABS 1.6 03/23/2024 1504   MONOABS 0.5 03/23/2024 1504   EOSABS 0.1 03/23/2024 1504   BASOSABS 0.0 03/23/2024 1504     Lithium  Lvl  Date Value Ref Range Status  09/09/2019 1.1 0.6 - 1.2 mmol/L Final    12/14/19 lithium  level 0.6 (trough 13 hours) , normal BMP and calcium    No results found for: PHENYTOIN, PHENOBARB, VALPROATE, CBMZ   .res Assessment: Plan:    Tionne Dayhoff was seen today for follow-up, manic behavior, anxiety, medication reaction and stress.  Diagnoses and all orders for this visit:  Bipolar I disorder, most recent episode (or current) manic (HCC)  Attention deficit hyperactivity disorder (ADHD), combined type  Generalized anxiety disorder  Panic disorder with agoraphobia  Obstructive sleep apnea  Insomnia due to mental condition   Mt has had significant disruptive mood and psychotic symptoms and substance abuse over the course of his treatment here.  It has been evident in chronic occupational dysfunction and some relational problems.  He is also been very prone to EPS and elevated blood sugars from atypicals making it difficult to get adequate mood stabilization.   Has a history of severe pancreatitis from Depakote.  He did not have an adequate response to carbamazepine.   lithium  was insufficient to control his symptoms in monotherapy and he developed diabetes insipidus.  There are no available mood stabilizers that do not have a warning of elevating blood sugar and cholesterol.  He has tried all the mood stabilizers that do not have this warning.  All of the available mood stabilizers left that have any effectiveness are antipsychotics and the FDA has required a class warning on all antipsychotics about blood sugar and cholesterol effects.  This is true despite the fact that not all antipsychotics are equal and increasing these risks.  The patient's reading of these potential side effects is complicating finding an effective treatment.  The only way to prove whether or not these meds are elevating his blood sugar and cholesterol is to stop the medications for a  period of time.  This exposes him to manic risk but there is no chance of finding an adequate mood stabilizer that will satisfy him until we can prove whether or not these meds are in fact affecting his blood sugar, cholesterol, blood pressure, and pulse.  Latuda   Been the best med so far for him.    Answered questions with no other good options obvious. Sleepiness was not better with reduction in Latuda  from 120 to 60 mg daily. Manic overactivity, reactivity is better with increase Latuda .  He has a low stress tolerance.  He can be easily agitated in public. Continue Latuda  160 mg PM.  Is getting more benefit.. .   Discussed potential metabolic side effects associated with atypical antipsychotics, as well as potential risk for movement side effects. Advised pt to contact office if movement side effects occur.   continue Hydroxyzine  10 mg HS prn  This has been helpful for anxiety and itching and may have some mild antimanic effect.  OK to continue with Medford Fischer for therapy  Counseling 25 min: resumed discussion about his desire to pursue disability due to his difficulty dealing with the public and work situations.  He has had chronic problems with work as previously noted.  Most of the time that he is involved difficulty with social interaction.  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  He has been passed over for potential promotions into managerial positions because he cannot handle that level of work performance.  We discussed the risks and harms associated with this ability including the social isolation and inactivity which tend to make depression worse.  He feels that he does not have another option because of repeated job failures. He reports applying to multiple jobs including data  entry and non-public jobs and can't get an interview.  Disc this again an dhe is intent on pursuing disability.  Says he has had 15 jobs but never get back level 1 when hired.   Asks for   [facility] [phone] to get documents from us .    He has continued abstinence from marijuana is encouraged.  He has a history of cannabinoid hyperemesis which finally convinced him to discontinue the marijuana.  His mood disorder and thought disorganization have improved since being off the marijuana.  He has a very long history of heavy marijuana dependence.   Constipation management effective  Sleep hygiene. Disc OSA and how it affects alertness and started CPAP and much better.    ADD tx is reasonable bc job affected by difficulty with handling two tasks at once.  Forgetful and can't get back on task.  Hx Work restrictions bc of it.  No regular stimulant. Ok trial modafinil  off label for this problem.  Also to help OSA.  Much less likley to trigger psychosis than traditional stimulants. Continue DT marked benefit: Modafinil  200 mg AM.  No obvious med options.   FU 3 mos  Lorene Macintosh, MD, DFAPA  Please see After Visit Summary for patient specific instructions.  Future Appointments  Date Time Provider Department Center  08/11/2024  3:00 PM Bernardo Bruckner, [facility] None  09/06/2024  2:40 PM Norleen Lynwood ORN, MD [facility]  09/07/2024  3:45 PM Alm Delon SAILOR, DO [facility] None  09/15/2024  3:00 PM Trixie File, MD [facility] None  01/03/2025  8:45 AM Lomax, Amy, NP [facility] None    No orders of the defined types were placed in this encounter.      -------------------------------

## 2024-08-10 DIAGNOSIS — K621 Rectal polyp: Secondary | ICD-10-CM | POA: Diagnosis not present

## 2024-08-10 DIAGNOSIS — K5730 Diverticulosis of large intestine without perforation or abscess without bleeding: Secondary | ICD-10-CM | POA: Diagnosis not present

## 2024-08-10 DIAGNOSIS — Z1211 Encounter for screening for malignant neoplasm of colon: Secondary | ICD-10-CM | POA: Diagnosis not present

## 2024-08-10 LAB — HM COLONOSCOPY

## 2024-08-11 ENCOUNTER — Ambulatory Visit: Admitting: Mental Health

## 2024-08-11 DIAGNOSIS — F3110 Bipolar disorder, current episode manic without psychotic features, unspecified: Secondary | ICD-10-CM | POA: Diagnosis not present

## 2024-08-11 NOTE — Progress Notes (Signed)
 [facility] Note  Name: Cody Short Date: 08/11/2024 MRN: [id number] DOB: 1976/01/04 PCP: Norleen Lynwood ORN, MD  Time spent: 51 minutes Time in: 3:00 p.m. time out 3: 51 p.m.  Treatment:   ind. Therapy  Virtual Visit via Telehealth Note Connected with patient by a telemedicine/telehealth application, with their informed consent, and verified patient privacy and that I am speaking with the correct person using two identifiers. I discussed the limitations, risks, security and privacy concerns of performing psychotherapy and the availability of in person appointments. I also discussed with the patient that there may be a patient responsible charge related to this service. The patient expressed understanding and agreed to proceed. I discussed the treatment planning with the patient. The patient was provided an opportunity to ask questions and all were answered. The patient agreed with the plan and demonstrated an understanding of the instructions. The patient was advised to call  our office if  symptoms worsen or feel they are in a crisis state and need immediate contact.   Therapist Location: office Patient Location: home     Mental Status Exam:    Appearance:    Casual     Behavior:   Appropriate  Motor:   WNL  Speech/Language:    Clear and Coherent  Affect:   Full range   Mood:   Euthymic   Thought process:   Logical, linear, goal directed  Thought content:     WNL  Sensory/Perceptual disturbances:     none  Orientation:   x4  Attention:   Good  Concentration:   Good  Memory:   Intact  Fund of knowledge:    Consistent with age and development  Insight:     Good  Judgment:    Good  Impulse Control:   Good     Reported Symptoms:  anxiety, rumination, intermittent depressed mood, impulsivity  Risk Assessment: Danger to Self:  No Self-injurious Behavior: No Danger to Others: No Duty to Warn:no Physical Aggression / Violence:No  Access to Firearms a  concern: No  Gang Involvement:No  Patient / guardian was educated about steps to take if suicide or homicide risk level increases between visits: yes While future psychiatric events cannot be accurately predicted, the patient does not currently require acute inpatient psychiatric care and does not currently meet [place]  involuntary commitment criteria.  Subjective:  Patient engaged in telehealth visit via video. Assessed progress.  He shared how his brother continues to bring a level of stress in the household as he also visits most days asking his father for money.  He stated that his parents and he continued to care for his brothers son who is age 1.  He stated that the court appointed his parents caretakers for the child.  Patient shared how he is trying to be helpful, help him with homework and other activities.  He stated that he is considering looking for employment, discussed potential jobs he might feel comfortable applying for.  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  He went on to share how he continues to struggle socially, wants to make friendships.  He reports having anxiety and we continue to work with him from a cognitive behavioral framework.  Provided some psychoeducation and facilitated his framing more self supportive thoughts such as  others not judging him or trying to ruin my life as he felt that son did this in the past.  He was encouraged to recognize and taking some specific, more simple steps such as trying to talk to others just once or twice a week as a way to begin.   Interventions:  motivational interviewing, CBT, supportive therapy  Diagnoses:    ICD-10-CM   1. Bipolar I disorder, most recent episode (or current) manic (HCC)  F31.10        Plan: Patient to utilize  coping skills as discussed, continue his medication compliance to maintain mood stability. patient will continue to work on identifying and challenging his obsessive thoughts, particularly regarding relationships, and practicing grounding techniques to manage his anxiety. He will also be encouraged to celebrate his achievements at work and continue to take ownership of his successes.  Long-term goals:   Maintain symptom reduction: The patient will report sustained reduction in symptoms of anxiety using both CBT and mindfulness interventions for 3 consecutive months progressively.  Improve emotional regulation: The patient will learn and apply CBT and mindfulness-based strategies to regulate emotions, such as mindfulness-based stress reduction and cognitive restructuring, and report an improvement in emotional regulation for at least 3 consecutive months progressively.    Short-term goal:  The patient will learn and apply CBT and mindfulness-based coping skills for managing anxiety and practice using it between sessions.       2.   The patient will CBT and mindfulness-based interventions to increase awareness of negative thought patterns. 3.   The patient will keep and maintain employment to keep financial stress manageable       4.   The patient will decrease anxiety and stress when driving and dealing with family issues.         Lonni Fischer, [facility] why cannot talk get out of the client violence against a just 70 that message about him I think maybe we does way is the big decision and we may need to do it but did you check on the

## 2024-08-12 MED ORDER — NOVOFINE PLUS PEN NEEDLE 32G X 4 MM MISC: 3 refills | Status: DC

## 2024-08-12 NOTE — Addendum Note (Signed)
 Addended by: CLEOTILDE ROLIN RAMAN on: 08/12/2024 11:01 AM   Modules accepted: Orders

## 2024-08-15 ENCOUNTER — Encounter: Payer: Self-pay | Admitting: Internal Medicine

## 2024-08-16 MED ORDER — INSULIN PEN NEEDLE 32G X 4 MM MISC: 12 refills | Status: DC

## 2024-08-19 ENCOUNTER — Other Ambulatory Visit: Payer: Self-pay | Admitting: Psychiatry

## 2024-08-19 DIAGNOSIS — F3110 Bipolar disorder, current episode manic without psychotic features, unspecified: Secondary | ICD-10-CM

## 2024-08-23 ENCOUNTER — Telehealth: Payer: Self-pay

## 2024-08-23 NOTE — Telephone Encounter (Signed)
 Pt called to check on disability paperwork.

## 2024-08-24 DIAGNOSIS — G4733 Obstructive sleep apnea (adult) (pediatric): Secondary | ICD-10-CM | POA: Diagnosis not present

## 2024-08-25 ENCOUNTER — Encounter: Payer: Self-pay | Admitting: Internal Medicine

## 2024-08-25 DIAGNOSIS — E1165 Type 2 diabetes mellitus with hyperglycemia: Secondary | ICD-10-CM

## 2024-08-25 MED ORDER — CARETOUCH PEN NEEDLES 31G X 5 MM MISC: 3 refills | Status: DC

## 2024-09-01 ENCOUNTER — Ambulatory Visit: Admitting: Mental Health

## 2024-09-01 DIAGNOSIS — F3110 Bipolar disorder, current episode manic without psychotic features, unspecified: Secondary | ICD-10-CM
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
## 2024-09-05 ENCOUNTER — Telehealth: Admitting: Psychiatry

## 2024-09-06 ENCOUNTER — Encounter: Payer: Self-pay | Admitting: Internal Medicine

## 2024-09-06 ENCOUNTER — Ambulatory Visit: Admitting: Internal Medicine

## 2024-09-06 VITALS — BP 136/82 | HR 75 | Temp 98.2°F | Ht 67.0 in | Wt 226.0 lb

## 2024-09-06 DIAGNOSIS — Z7984 Long term (current) use of oral hypoglycemic drugs: Secondary | ICD-10-CM | POA: Diagnosis not present

## 2024-09-06 DIAGNOSIS — I10 Essential (primary) hypertension: Secondary | ICD-10-CM

## 2024-09-06 DIAGNOSIS — E782 Mixed hyperlipidemia: Secondary | ICD-10-CM | POA: Diagnosis not present

## 2024-09-06 DIAGNOSIS — E1165 Type 2 diabetes mellitus with hyperglycemia: Secondary | ICD-10-CM | POA: Diagnosis not present

## 2024-09-06 DIAGNOSIS — N529 Male erectile dysfunction, unspecified: Secondary | ICD-10-CM

## 2024-09-06 DIAGNOSIS — J309 Allergic rhinitis, unspecified: Secondary | ICD-10-CM

## 2024-09-06 DIAGNOSIS — K621 Rectal polyp: Secondary | ICD-10-CM | POA: Insufficient documentation

## 2024-09-06 DIAGNOSIS — K5730 Diverticulosis of large intestine without perforation or abscess without bleeding: Secondary | ICD-10-CM | POA: Insufficient documentation

## 2024-09-06 DIAGNOSIS — E559 Vitamin D deficiency, unspecified: Secondary | ICD-10-CM

## 2024-09-06 DIAGNOSIS — Z0001 Encounter for general adult medical examination with abnormal findings: Secondary | ICD-10-CM

## 2024-09-06 LAB — LIPID PANEL
Cholesterol: 165 mg/dL (ref 0–200)
HDL: 44.6 mg/dL (ref >39.00)
LDL Cholesterol: 82 mg/dL (ref 0–99)
NonHDL: 120.11
Total CHOL/HDL Ratio: 4
Triglycerides: 192 mg/dL — ABNORMAL HIGH (ref 0.0–149.0)
VLDL: 38.4 mg/dL (ref 0.0–40.0)

## 2024-09-06 LAB — CBC WITH DIFFERENTIAL/PLATELET
Basophils Absolute: 0 K/uL (ref 0.0–0.1)
Basophils Relative: 0.4 % (ref 0.0–3.0)
Eosinophils Absolute: 0.1 K/uL (ref 0.0–0.7)
Eosinophils Relative: 0.9 % (ref 0.0–5.0)
HCT: 45.6 % (ref 39.0–52.0)
Hemoglobin: 15.1 g/dL (ref 13.0–17.0)
Lymphocytes Relative: 18 % (ref 12.0–46.0)
Lymphs Abs: 1.7 K/uL (ref 0.7–4.0)
MCHC: 33.2 g/dL (ref 30.0–36.0)
MCV: 85.2 fl (ref 78.0–100.0)
Monocytes Absolute: 0.7 K/uL (ref 0.1–1.0)
Monocytes Relative: 6.8 % (ref 3.0–12.0)
Neutro Abs: 7.1 K/uL (ref 1.4–7.7)
Neutrophils Relative %: 73.9 % (ref 43.0–77.0)
Platelets: 243 K/uL (ref 150.0–400.0)
RBC: 5.35 Mil/uL (ref 4.22–5.81)
RDW: 13.7 % (ref 11.5–15.5)
WBC: 9.6 K/uL (ref 4.0–10.5)

## 2024-09-06 LAB — TSH: TSH: 0.75 u[IU]/mL (ref 0.35–5.50)

## 2024-09-06 LAB — BASIC METABOLIC PANEL WITH GFR
BUN: 13 mg/dL (ref 6–23)
CO2: 24 meq/L (ref 19–32)
Calcium: 10 mg/dL (ref 8.4–10.5)
Chloride: 101 meq/L (ref 96–112)
Creatinine, Ser: 1.15 mg/dL (ref 0.40–1.50)
GFR: 76.6 mL/min (ref >60.00)
Glucose, Bld: 142 mg/dL — ABNORMAL HIGH (ref 70–99)
Potassium: 4.1 meq/L (ref 3.5–5.1)
Sodium: 137 meq/L (ref 135–145)

## 2024-09-06 LAB — HEMOGLOBIN A1C: Hgb A1c MFr Bld: 8.2 % — ABNORMAL HIGH (ref 4.6–6.5)

## 2024-09-06 LAB — HEPATIC FUNCTION PANEL
ALT: 21 U/L (ref 0–53)
AST: 20 U/L (ref 0–37)
Albumin: 5 g/dL (ref 3.5–5.2)
Alkaline Phosphatase: 85 U/L (ref 39–117)
Bilirubin, Direct: 0 mg/dL (ref 0.0–0.3)
Total Bilirubin: 0.4 mg/dL (ref 0.2–1.2)
Total Protein: 8.3 g/dL (ref 6.0–8.3)

## 2024-09-06 LAB — VITAMIN D 25 HYDROXY (VIT D DEFICIENCY, FRACTURES): VITD: 29.74 ng/mL — ABNORMAL LOW (ref 30.00–100.00)

## 2024-09-06 MED ORDER — SILDENAFIL CITRATE 100 MG PO TABS: 50.0000 mg | ORAL_TABLET | Freq: Every day | ORAL | 11 refills | Status: AC | PRN

## 2024-09-06 MED ORDER — TRIAMCINOLONE ACETONIDE 55 MCG/ACT NA AERO: 2.0000 | INHALATION_SPRAY | Freq: Every day | NASAL | 12 refills | Status: AC

## 2024-09-06 MED ORDER — FEXOFENADINE HCL 180 MG PO TABS: 180.0000 mg | ORAL_TABLET | Freq: Every day | ORAL | 5 refills | Status: AC

## 2024-09-06 NOTE — Progress Notes (Unsigned)
 Patient ID: Cody Short, male   DOB: [birth year], 48 y.o.   MRN: [id number]         Chief Complaint:: wellness exam and Annual Exam  ***       HPI:  Cody Short is a 48 y.o. male here for wellness exam                        Also***   Wt Readings from Last 3 Encounters:  09/06/24 226 lb (102.5 kg)  05/12/24 228 lb (103.4 kg)  03/03/24 233 lb (105.7 kg)   BP Readings from Last 3 Encounters:  09/06/24 136/82  05/12/24 120/80  05/02/24 (!) 182/78   Immunization History  Administered Date(s) Administered   HPV 9-valent 06/26/2022   Hepatitis A, Adult 06/26/2022   Influenza Inj Mdck Quad Pf 06/26/2022   Influenza Split 11/27/2011, 07/19/2012   Influenza Whole 07/29/2008, 07/26/2009, 07/22/2010   Influenza, Seasonal, Injecte, Preservative Fre 09/10/2023   Influenza,inj,Quad PF,6+ Mos 08/15/2013, 06/27/2015, 08/11/2016, 09/01/2017, 06/18/2018   Influenza-Unspecified 08/31/2014   Pneumococcal Conjugate-13 08/31/2014   Pneumococcal Polysaccharide-23 12/24/2015   Td 07/22/2010   Health Maintenance Due  Topic Date Due   Hepatitis B Vaccines 19-59 Average Risk (1 of 3 - 19+ 3-dose series) Never done   DTaP/Tdap/Td (2 - Tdap) 07/22/2020   HPV VACCINES (2 - 3-dose SCDM series) 07/24/2022   Influenza Vaccine  05/27/2024      Past Medical History:  Diagnosis Date   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 Recurs if cannot walk for a week.      TOBACCO ABUSE, HX OF 07/31/2009   Past Surgical History:  Procedure Laterality Date   pilar cystectomy      reports that he quit smoking about 17 years ago. His smoking use included cigarettes. He started smoking  about 27 years ago. He has a 25 pack-year smoking history. He has never used smokeless tobacco. He reports that he does not currently use alcohol. He reports that he does not use drugs. family history includes Breast cancer in his mother; Cirrhosis in his mother; Diabetes in his father and mother; High blood pressure in his father and mother; Melanoma in his maternal uncle. Allergies  Allergen Reactions   Accutane [Isotretinoin]     Pancreatitis    Divalproex Sodium Other (See Comments)    unknown   Methylphenidate Hcl     Aggravate bipolar   Oxycodone Hcl     REACTION: hallucinations   Ozempic  (0.25 Or 0.5 Mg-Dose) [Semaglutide (0.25 Or 0.5mg -Dos)] Nausea Only   Paroxetine     Aggravate bipolar disorder   Rosuvastatin  Other (See Comments)    Myopathy -muscle cramps, elevated CK   Current Outpatient Medications on File Prior to Visit  Medication Sig Dispense Refill   Accu-Chek Softclix Lancets lancets Use as instructed to check blood sugar 4 times daily. DX:E11.65 300 each 3   Blood Glucose Monitoring Suppl (CONTOUR NEXT MONITOR) w/Device KIT Check sugar 4 times daily 1 kit 0   Blood Glucose Monitoring Suppl (FREESTYLE LITE) DEVI Use as instructed to check sugar 4 times daily  1 each 0   Clindamycin  Phos-Benzoyl Perox (ONEXTON) 1.2-3.75 % GEL Apply 1 Application topically in the morning. 50 g 4   cyclobenzaprine  (FLEXERIL ) 5 MG tablet Take 1 tablet (5 mg total) by mouth 3 (three) times daily as needed. 40 tablet 1   empagliflozin  (JARDIANCE ) 10 MG TABS tablet Take 1 tablet (10 mg total) by mouth daily before breakfast. 90 tablet 3   gemfibrozil  (LOPID ) 600 MG tablet Take 1 tablet (600 mg total) by mouth 2 (two) times daily before a meal. 180 tablet 3   glucose blood (ACCU-CHEK GUIDE TEST) test strip Use to check blood sugar 4 times a day 400 each 12   glucose blood (ACCU-CHEK GUIDE) test strip USE AS INSTRUCTED TO CHECK BLOOD SUGAR 4 TIMES A DAY 300 strip 3   hydrOXYzine  (ATARAX ) 10 MG  tablet TAKE 1 TABLET BY MOUTH 3 TIMES  DAILY AS NEEDED FOR ITCHING OR  INSOMNIA 90 tablet 2   insulin  aspart (NOVOLOG  FLEXPEN) 100 UNIT/ML FlexPen INJECT SUBCUTANEOUSLY 10 TO 30  UNITS IN THE MORNING AT NOON IN  THE EVENING AND AT BEDTIME 120 mL 2   insulin  glargine (LANTUS  SOLOSTAR) 100 UNIT/ML Solostar Pen Inject 30 Units into the skin daily. 30 mL 3   lurasidone  (LATUDA ) 80 MG TABS tablet TAKE 2 TABLETS BY MOUTH DAILY  WITH BREAKFAST 60 tablet 2   metFORMIN  (GLUCOPHAGE -XR) 500 MG 24 hr tablet TAKE 2 TABLETS BY MOUTH TWICE  DAILY WITH MEALS 360 tablet 2   modafinil  (PROVIGIL ) 200 MG tablet Take 1 tablet (200 mg total) by mouth daily. 30 tablet 5   rosuvastatin  (CRESTOR ) 10 MG tablet Take 1 tablet (10 mg total) by mouth daily. 90 tablet 3   Tazarotene  (ARAZLO ) 0.045 % LOTN Apply 1 Application topically at bedtime. Start by using it 2x per week for 1 month, then move to every other night 45 g 5   valsartan  (DIOVAN ) 160 MG tablet Take 1 tablet (160 mg total) by mouth daily. 90 tablet 3   No current facility-administered medications on file prior to visit.        ROS:  All others reviewed and negative.  Objective        PE:  BP 136/82 (BP Location: Right Arm, Patient Position: Sitting, Cuff Size: Normal)   Pulse 75   Temp 98.2 F (36.8 C) (Oral)   Ht 5' 7 (1.702 m)   Wt 226 lb (102.5 kg)   SpO2 98%   BMI 35.40 kg/m                 Constitutional: Pt appears in NAD               HENT: Head: NCAT.                Right Ear: External ear normal.                 Left Ear: External ear normal.                Eyes: . Pupils are equal, round, and reactive to light. Conjunctivae and EOM are normal               Nose: without d/c or deformity               Neck: Neck supple. Gross normal ROM               Cardiovascular: Normal rate and regular rhythm.  Pulmonary/Chest: Effort normal and breath sounds without rales or wheezing.                Abd:  Soft, NT, ND, + BS, no  organomegaly               Neurological: Pt is alert. At baseline orientation, motor grossly intact               Skin: Skin is warm. No rashes, no other new lesions, LE edema - ***               Psychiatric: Pt behavior is normal without agitation   Micro: none  Cardiac tracings I have personally interpreted today:  none  Pertinent Radiological findings (summarize): none   Lab Results  Component Value Date   WBC 5.8 03/23/2024   HGB 14.5 03/23/2024   HCT 42.7 03/23/2024   PLT 190.0 03/23/2024   GLUCOSE 115 (H) 03/23/2024   CHOL 232 (H) 03/23/2024   TRIG 313.0 (H) 03/23/2024   HDL 40.90 03/23/2024   LDLDIRECT 171.0 03/03/2024   LDLCALC 129 (H) 03/23/2024   ALT 21 03/23/2024   AST 22 03/23/2024   NA 139 03/23/2024   K 4.2 03/23/2024   CL 104 03/23/2024   CREATININE 1.25 03/23/2024   BUN 17 03/23/2024   CO2 24 03/23/2024   TSH 1.01 03/23/2024   PSA 0.47 03/23/2024   HGBA1C 7.6 (H) 03/23/2024   MICROALBUR 1.9 03/23/2024   Assessment/Plan:  Cody Short is a 48 y.o. White or Caucasian [1] male with  has a past medical history of Acne varioliformis (07/04/2010), Anxiety, Bipolar affective disorder (HCC), Depression, DM w/o Complication Type II (07/05/2007), High cholesterol, Hypertension, HYPERTRIGLYCERIDEMIA (10/14/2010), Nocturia (07/04/2010), OSA on CPAP, Pancreatitis, PILAR CYST (08/03/2007), and TOBACCO ABUSE, HX OF (07/31/2009).  No problem-specific Assessment & Plan notes found for this encounter.  Followup: No follow-ups on file.  Lynwood Rush, MD 09/06/2024 3:17 PM [facility]  Primary Care - [facility] - [facility]

## 2024-09-06 NOTE — Patient Instructions (Signed)
 Please take all new medication as prescribed - the viagra as needed  Please take all new medication as prescribed - the generic allegra and nasacort for allergies  Please continue all other medications as before, and refills have been done if requested.  Please have the pharmacy call with any other refills you may need.  Please continue your efforts at being more active, low cholesterol diet, and weight control.  Please keep your appointments with your specialists as you may have planned  Please go to the LAB at the blood drawing area for the tests to be done  You will be contacted by phone if any changes need to be made immediately.  Otherwise, you will receive a letter about your results with an explanation, but please check with MyChart first.  Please make an Appointment to return in 6 months, or sooner if needed

## 2024-09-07 ENCOUNTER — Other Ambulatory Visit: Payer: Self-pay | Admitting: Internal Medicine

## 2024-09-07 ENCOUNTER — Ambulatory Visit (INDEPENDENT_AMBULATORY_CARE_PROVIDER_SITE_OTHER): Admitting: Dermatology

## 2024-09-07 ENCOUNTER — Ambulatory Visit: Payer: Self-pay | Admitting: Internal Medicine

## 2024-09-07 ENCOUNTER — Encounter: Payer: Self-pay | Admitting: Dermatology

## 2024-09-07 VITALS — BP 131/77 | HR 83

## 2024-09-07 DIAGNOSIS — L700 Acne vulgaris: Secondary | ICD-10-CM

## 2024-09-07 DIAGNOSIS — L905 Scar conditions and fibrosis of skin: Secondary | ICD-10-CM

## 2024-09-07 LAB — URINALYSIS, ROUTINE W REFLEX MICROSCOPIC
Bilirubin Urine: NEGATIVE
Hgb urine dipstick: NEGATIVE
Ketones, ur: NEGATIVE
Leukocytes,Ua: NEGATIVE
Nitrite: NEGATIVE
Specific Gravity, Urine: 1.01 (ref 1.000–1.030)
Total Protein, Urine: NEGATIVE
Urine Glucose: >=1000 — AB
Urobilinogen, UA: 0.2 (ref 0.0–1.0)
pH: 5.5 (ref 5.0–8.0)

## 2024-09-07 MED ORDER — DOXYCYCLINE HYCLATE 50 MG PO CAPS: ORAL_CAPSULE | ORAL | 6 refills | Status: AC

## 2024-09-07 MED ORDER — ROSUVASTATIN CALCIUM 20 MG PO TABS: 20.0000 mg | ORAL_TABLET | Freq: Every day | ORAL | 3 refills | Status: AC

## 2024-09-07 NOTE — Patient Instructions (Addendum)
 VISIT SUMMARY:  Today, you visited to discuss the management of your severe acne. We reviewed your current treatment regimen and addressed the challenges you are facing with medication application, especially on your back. We also considered your history of pancreatitis and its impact on your treatment options.  YOUR PLAN:  -ACNE VULGARIS INVOLVING FACE, CHEST, AND BACK:  Acne vulgaris is a common skin condition that causes pimples and can affect the face, chest, and back.   To manage your acne, you should alternate applying Arazlo  and Onexton every other night on your face and body, mixing them with moisturizer.   Use a back applicator for Onexton on your back, and apply Neutrogena body acne spray with salicylic acid in the morning.   Additionally, take doxycycline  50 mg with your largest meal of the day to minimize stomach upset.    INSTRUCTIONS:  Please follow the new application routine for your acne medications as discussed. Use a back applicator for easier application on your back. Take doxycycline  50 mg with your largest meal to reduce gastrointestinal upset. Avoid any medications that have a risk of causing pancreatitis. If you have any questions or concerns, please schedule a follow-up appointment.       Important Information  Due to recent changes in healthcare laws, you may see results of your pathology and/or laboratory studies on MyChart before the doctors have had a chance to review them. We understand that in some cases there may be results that are confusing or concerning to you. Please understand that not all results are received at the same time and often the doctors may need to interpret multiple results in order to provide you with the best plan of care or course of treatment. Therefore, we ask that you please give us  2 business days to thoroughly review all your results before contacting the office for clarification. Should we see a critical lab result, you will be  contacted sooner.   If You Need Anything After Your Visit  If you have any questions or concerns for your doctor, please call our main line at [phone] If no one answers, please leave a voicemail as directed and we will return your call as soon as possible. Messages left after 4 pm will be answered the following business day.   You may also send us  a message via MyChart. We typically respond to MyChart messages within 1-2 business days.  For prescription refills, please ask your pharmacy to contact our office. Our fax number is [phone].  If you have an urgent issue when the clinic is closed that cannot wait until the next business day, you can page your doctor at the number below.    Please note that while we do our best to be available for urgent issues outside of office hours, we are not available 24/7.   If you have an urgent issue and are unable to reach us , you may choose to seek medical care at your doctor's office, retail clinic, urgent care center, or emergency room.  If you have a medical emergency, please immediately call 911 or go to the emergency department. In the event of inclement weather, please call our main line at [phone] for an update on the status of any delays or closures.  Dermatology Medication Tips: Please keep the boxes that topical medications come in in order to help keep track of the instructions about where and how to use these. Pharmacies typically print the medication instructions only on the boxes and not  directly on the medication tubes.   If your medication is too expensive, please contact our office at [phone] or send us  a message through MyChart.   We are unable to tell what your co-pay for medications will be in advance as this is different depending on your insurance coverage. However, we may be able to find a substitute medication at lower cost or fill out paperwork to get insurance to cover a needed medication.   If a prior  authorization is required to get your medication covered by your insurance company, please allow us  1-2 business days to complete this process.  Drug prices often vary depending on where the prescription is filled and some pharmacies may offer cheaper prices.  The website www.goodrx.com contains coupons for medications through different pharmacies. The prices here do not account for what the cost may be with help from insurance (it may be cheaper with your insurance), but the website can give you the price if you did not use any insurance.  - You can print the associated coupon and take it with your prescription to the pharmacy.  - You may also stop by our office during regular business hours and pick up a GoodRx coupon card.  - If you need your prescription sent electronically to a different pharmacy, notify our office through [facility] or by phone at [phone]

## 2024-09-07 NOTE — Progress Notes (Unsigned)
   Follow-Up Visit  Patient (and/or pt guardian) consented to the use of AI-assisted tools for note generation.    Subjective  Cody Short is a 48 y.o. male who presents for the following: Acne  Patient was last evaluated on 05/02/24.  At this visit patient was prescribed Onexton (clindamycin  & BP) topical to use in AM on face and Arazlo  to use at night Recommended CeraVe moisturizer Patient reports he used the Onexton for two months but stopped using because it was bleaching his shirts Patient reports sxs are somewhat improved on the face.   Patient reports he is using every other night Patient states his thighs, hips, buttocks, chest and back was noticing improvement but the last 3 weeks has had breakouts Patient denies medication changes.  The following portions of the chart were reviewed this encounter and updated as appropriate: medications, allergies, medical history  Review of Systems:  No other skin or systemic complaints except as noted in HPI or Assessment and Plan.  Objective  Well appearing patient in no apparent distress; mood and affect are within normal limits.  A focused examination was performed of the following areas: Face, chest, Back, thighs and buttocks  Relevant exam findings are noted in the Assessment and Plan.                  Assessment & Plan  ACNE VULGARIS Exam: Open comedones and inflammatory papules***  Flared  Treatment Plan: Continue with Arazlo  with CeraVe on face and body and Onexton with CeraVe the next night on face and body. Alternate medications nightly Recommended Neutrogena Salicylic Acid Spray   ACNE VULGARIS    Return in about 4 months (around 01/05/2025) for Acne.  I, Lyle Cords, as acting as a neurosurgeon for [employer], DO .   Documentation: I have reviewed the above documentation for accuracy and completeness, and I agree with the above.  Delon Lenis, DO

## 2024-09-08 ENCOUNTER — Encounter: Payer: Self-pay | Admitting: Internal Medicine

## 2024-09-08 DIAGNOSIS — J309 Allergic rhinitis, unspecified: Secondary | ICD-10-CM | POA: Insufficient documentation

## 2024-09-08 DIAGNOSIS — E785 Hyperlipidemia, unspecified: Secondary | ICD-10-CM | POA: Insufficient documentation

## 2024-09-08 DIAGNOSIS — N529 Male erectile dysfunction, unspecified: Secondary | ICD-10-CM | POA: Insufficient documentation

## 2024-09-08 NOTE — Assessment & Plan Note (Signed)
 Lab Results  Component Value Date   LDLCALC 82 09/06/2024   Uncontrolled, for increased crestor  20 mg qd

## 2024-09-08 NOTE — Assessment & Plan Note (Signed)
 Mild to mod, for allegra 180 every day prn, and nasacort asd,  to f/u any worsening symptoms or concerns

## 2024-09-08 NOTE — Assessment & Plan Note (Signed)
 Lab Results  Component Value Date   HGBA1C 8.2 (H) 09/06/2024   Stable, pt to continue current medical treatment jardiance  10 every day novolog  SSI, lantus  30 u every day, metformin  ER 500 mg - 2 bid and f/u endo for better control, declines other change today

## 2024-09-08 NOTE — Assessment & Plan Note (Signed)
 BP Readings from Last 3 Encounters:  09/07/24 131/77  09/06/24 136/82  05/12/24 120/80   Stable, pt to continue medical treatment diovan  160 mg qd

## 2024-09-08 NOTE — Assessment & Plan Note (Signed)
 Last vitamin D  Lab Results  Component Value Date   VD25OH 29.74 (L) 09/06/2024   Low, to start oral replacement

## 2024-09-08 NOTE — Assessment & Plan Note (Signed)
Mild to mod, for viagra prn,  to f/u any worsening symptoms or concerns 

## 2024-09-09 ENCOUNTER — Other Ambulatory Visit: Payer: Self-pay | Admitting: Psychiatry

## 2024-09-09 DIAGNOSIS — F3110 Bipolar disorder, current episode manic without psychotic features, unspecified: Secondary | ICD-10-CM

## 2024-09-15 ENCOUNTER — Encounter: Payer: Self-pay | Admitting: Internal Medicine

## 2024-09-15 ENCOUNTER — Ambulatory Visit: Admitting: Internal Medicine

## 2024-09-15 VITALS — BP 120/70 | HR 83 | Ht 67.0 in | Wt 229.4 lb

## 2024-09-15 DIAGNOSIS — E782 Mixed hyperlipidemia: Secondary | ICD-10-CM

## 2024-09-15 DIAGNOSIS — Z794 Long term (current) use of insulin: Secondary | ICD-10-CM | POA: Diagnosis not present

## 2024-09-15 DIAGNOSIS — E1165 Type 2 diabetes mellitus with hyperglycemia: Secondary | ICD-10-CM | POA: Diagnosis not present

## 2024-09-15 DIAGNOSIS — Z7984 Long term (current) use of oral hypoglycemic drugs: Secondary | ICD-10-CM | POA: Diagnosis not present

## 2024-09-15 DIAGNOSIS — E66812 Obesity, class 2: Secondary | ICD-10-CM

## 2024-09-15 DIAGNOSIS — E061 Subacute thyroiditis: Secondary | ICD-10-CM | POA: Diagnosis not present

## 2024-09-15 NOTE — Progress Notes (Signed)
 Patient ID: Cody Short, male   DOB: 1976/06/29, 48 y.o.   MRN: [id number]  HPI: SAHITH Short is a 48 y.o.-year-old male, presenting for follow-up for DM2, dx in ~2010, insulin -dependent, uncontrolled, without long term complications. Last visit 4 months ago. He will change insurance to [facility] next year.  Interim history: He continues to have increased urination - worse with artificial sweeteners, but he notices that if he avoids these, his urination is improved.  No blurry vision, nausea, chest pain. He describes that he was doing better with his diet and lost weight after our last visit, however his nephew is living with him and his parents and pt. started eating more fast food and candy especially around following.  He again started to improve his diet again and he did notice an improvement in blood sugars.  DM2: Patient's diabetes is directly correlated with his bipolar disease treatment: In the past he had frequent urination resolved after stopping lithium .  While transitioning off lithium , his Saphris  dose was increased the blood sugars started to increase afterwards.    He was then taken off Saphris  and started on Loxitane .  This was making him sleepier, but was not increasing his blood sugars.  In fact, sugars started to improve and he was able to reduce his insulin  doses.   She had to stop Loxitane  2/2 increased BP and pulse.  In summer 2021, he switched to Caplyta  >> sugars improved significantly on this.  In fact, he tells me that he was able to come off insulin  completely while on this.  However, this did not work for him -was having anger outbursts on it.   He started on Latuda  >> feeling better on this. However, on this, he was eating more >> sugars increased.  Also, he gained weight. He continues on this.  He was on an Topamax . This was causing nausea >> stopped.   Reviewed HbA1c levels: Lab Results  Component Value Date   HGBA1C 8.2 (H) 09/06/2024    HGBA1C 7.6 (H) 03/23/2024   HGBA1C 7.9 (H) 03/03/2024   HGBA1C 7.2 (A) 12/29/2023   HGBA1C 7.2 08/31/2023   HGBA1C 8.2 04/27/2023   HGBA1C 7.3 (A) 11/24/2022   HGBA1C 7.4 (A) 07/15/2022   HGBA1C 7.4 (A) 03/06/2022   HGBA1C 9.1 (A) 10/10/2021   HGBA1C 7.4 (A) 05/14/2021   HGBA1C 7.3 (A) 01/18/2021   HGBA1C 6.9 (A) 09/14/2020   HGBA1C 8.6 (A) 05/10/2020   HGBA1C 7.9 (A) 02/14/2020   HGBA1C 7.7 (A) 06/14/2019   HGBA1C 6.8 (A) 11/29/2018   HGBA1C 8.1 (A) 04/02/2018   HGBA1C 7.4 12/03/2017   HGBA1C 7.1 09/01/2017  05/07/2021: HbA1c 9.3% 12/22/2018: HbA1c 7.2%  Pt is on a regimen of: - Metformin  ER 2000 mg at dinnertime - Jardiance  10 >> 25 >> 12.5 >> 5mg  daily in am - Novolog  before meals:  5-15 >> 35-40 >> 15-25 >> 25-30 >> 15-20 >> 25-30 units for a higher carb meal - Levemir  15 >> 30-35 units at bedtime >> 10-15 units daily >> Lantus  30-60 >> 20-40 >> 30-40 units daily He could not tolerate Ozempic  >> nausea, AP We had to stop Jardiance  due to increased urination. He was previously on Lantus  but stopped since sugars improved. He tried Glipizide  >> hypoglycemia in the 40s repeatedly. Lowest: 25. Also, Glipizide  2.5 mg in am >> nausea, vomiting, constipation We tried Invokana  >> bothersome urination (when he was working) >> had to stop. He had pancreatitis 2/2 Depakote and HTG in the  past. He stopped Actos  b/c stomach pain. >>  We stopped Cycloset  >> inefficient, could not use b/c price.  Pt checks his sugars 4 times a day per review of his log (tried CGM - Dexcom - bothersome alarms, false lows): - am: 82-144, 197 >> 76-141, 178, 248 >> 112-224, 249, 283 - 2h after b'fast: 100-166, 244 >> 101 >> 162, 173, 325 >> n/c   - lunch: 175-260, 343 >> 67, 85-120 >> 85-143, 154 >> n/c  - 2h after lunch: 110-202, 290 >> 143, 144 >> n/c  - before dinner: 96 >> 79-155, 186 >> 95-133, 188 >> n/c - 2h after dinner: 117, 171 >> 103, 126 >> 114, 117, 222 >> 129-234 - bedtime:  93-197 >>  108-153, 189 >> 109-163, 177 >> 117-270 - nighttime: see above >> 134 >> 83, 95 >> n/c >> 133 >> n/c Lowest sugar was 69... >> 76 >> 88 >> 112; he has hypoglycemia awareness in the 70s. Highest sugar was 354 ...>> 340 >> 197 >> 25-30283.  Glucometer: FreeStyle Lite (he likes this)  No history of CKD, last BUN/creatinine:  Lab Results  Component Value Date   BUN 13 09/06/2024   CREATININE 1.15 09/06/2024  09/10/2022: 20/1.3, GFR 69.5, glucose 127  He had MAU in the past, improved at last checks: Lab Results  Component Value Date   MICRALBCREAT 27.3 03/23/2024   MICRALBCREAT 3.4 03/15/2013   MICRALBCREAT 1.8 09/01/2012   MICRALBCREAT 0.4 07/04/2010   MICRALBCREAT 6.2 12/12/2009  12/22/2018: 262 On Diovan .  He has mixed hyperlipidemia: Lab Results  Component Value Date   CHOL 165 09/06/2024   HDL 44.60 09/06/2024   LDLCALC 82 09/06/2024   LDLDIRECT 171.0 03/03/2024   TRIG 192.0 (H) 09/06/2024   CHOLHDL 4 09/06/2024  05/2023: 105/126/46/38 07/20/2023:  09/10/2022: 173/199/40/93 06/23/2022: 188/347/41/78 05/03/2020: 225/345/38/126 11/23/2019: 109/148/34/49 12/22/2018: 220/359/41/145 Stopped fenofibrate  160 and Zetia  09/2023. He tried Repatha  >> PA not approved.  Rosuvastatin  caused leg cramps, reportedly elevated CK (900s).  He went to the emergency room with myalgias 03/09/2024.  He had a very low vitamin D  and a high CK. He feels this was related to dehydration. Now back on Rosuvastatin  20 and gemfibrozil  600 mg 2x a day.  Latest LFTs were reviewed and these were normal: Lab Results  Component Value Date   ALT 21 09/06/2024   AST 20 09/06/2024   ALKPHOS 85 09/06/2024   BILITOT 0.4 09/06/2024   - last eye exam was in 10/2023: No DR. Decreased vision - depth. He was given a Rx for glasses.  -+ Numbness but no tingling in his feet. Last foot exam 05/12/2024.  He developed chest tightness 05/2021 and saw Dr. Ladona.  He had CAC score of 0 (07/11/2021). He has a history  of scrotal rash-improved on Nystatin  + Triamcinolone .  He may need refills in the future. He has recurrent groin furunculosis. 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  Subclinical thyrotoxicosis: TSH levels were reviewed: Lab Results  Component Value Date   TSH 0.75 09/06/2024   TSH 1.01 03/23/2024   TSH 0.79 03/03/2024   TSH 1.46 08/31/2023   TSH 1.11 10/10/2021   TSH <0.01 (L) 01/18/2021   TSH <0.01 (L) 12/11/2020   TSH 0.02 (L) 09/14/2020   TSH 4.03 12/17/2015   TSH 2.08 01/03/2014   TSH 5.15 11/20/2011   TSH 2.15 07/04/2010   TSH 3.82 12/12/2009   TSH 1.81 10/29/2007   FREET4 0.83 08/31/2023   FREET4 0.76 10/10/2021  FREET4 1.13 01/18/2021   FREET4 1.56 12/11/2020   FREET4 1.41 09/14/2020   T3FREE 3.7 08/31/2023   T3FREE 3.7 10/10/2021   T3FREE 3.5 01/18/2021   T3FREE 4.1 12/11/2020   T3FREE 3.9 09/14/2020   06/23/2022: TSH 1.13 05/07/2021: TSH 1.21 08/06/2020: TSH 0.079 05/03/2020: TSH 0.528 12/22/2018: 3.648  We checked a thyroid  uptake and scan (01/02/2021) and this was consistent with thyroiditis: The thyroid  scan is unremarkable. No hot or cold thyroid  nodules are identified. 4 hour I-123 uptake = 2.7% (normal 5-20%) 24 hour I-123 uptake = 5.4% (normal 10-30%)   IMPRESSION: Low 4 hour and 24 hour or I 123 uptake may suggest thyroiditis.   Pt denies: - feeling nodules in neck - hoarseness - dysphagia - choking  He had problems with muscle cramps/abdominal pain and lipase was found to be elevated again, at 67 (11-51) in 04/2021.  At that time, he also had transaminitis and an increase CK at 562 (49-347).   A lipase was again found to be elevated on 09/04/2023, at 115. He sees dermatology (Dr. Dietrich) and started Accutane for acne rash caused by Latuda .  His triglycerides increased significantly afterwards.  To improve these, he started to work on diet and exercise-per his scale at home he lost approximately 15 pounds in the last month. He then   stopped the Accutane 2/2 AP + back pain.  ROS:+ see HPI  I reviewed pt's medications, allergies, PMH, social hx, family hx, and changes were documented in the history of present illness. Otherwise, unchanged from my initial visit note.  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 Recurs if cannot walk for a week.      TOBACCO ABUSE, HX OF 07/31/2009   Past Surgical History:  Procedure Laterality Date   pilar cystectomy     History   Social History   Marital Status: Single    Spouse Name: N/A   Number of Children: 0   Occupational History      Social History Main Topics   Smoking status: Former Smoker - quit in 2010   Smokeless tobacco: Never Used   Alcohol Use: No     Comment: none at all   Drug Use: No   Current Outpatient Medications  Medication Sig Dispense Refill   Accu-Chek Softclix Lancets lancets Use as instructed to check blood sugar 4 times daily. DX:E11.65 300 each 3   Blood Glucose Monitoring Suppl (CONTOUR NEXT MONITOR) w/Device KIT Check sugar 4 times daily 1 kit 0   Blood Glucose Monitoring Suppl (FREESTYLE LITE) DEVI Use as instructed to check sugar 4 times daily 1 each 0   Clindamycin  Phos-Benzoyl Perox (ONEXTON) 1.2-3.75 % GEL Apply 1 Application topically in the morning. 50 g 4   cyclobenzaprine  (FLEXERIL ) 5 MG tablet Take 1 tablet (5 mg total) by mouth 3 (three) times daily as needed. 40 tablet 1   doxycycline  (VIBRAMYCIN ) 50 MG capsule Take one tablet daily with large meal 30 capsule 6   empagliflozin  (JARDIANCE ) 10 MG TABS tablet Take 1 tablet (10 mg total) by mouth daily before breakfast. 90  tablet 3    fexofenadine (ALLEGRA) 180 MG tablet Take 1 tablet (180 mg total) by mouth daily. 30 tablet 5   gemfibrozil  (LOPID ) 600 MG tablet Take 1 tablet (600 mg total) by mouth 2 (two) times daily before a meal. 180 tablet 3   glucose blood (ACCU-CHEK GUIDE TEST) test strip Use to check blood sugar 4 times a day 400 each 12   glucose blood (ACCU-CHEK GUIDE) test strip USE AS INSTRUCTED TO CHECK BLOOD SUGAR 4 TIMES A DAY 300 strip 3   hydrOXYzine  (ATARAX ) 10 MG tablet TAKE 1 TABLET BY MOUTH 3 TIMES  DAILY AS NEEDED FOR ITCHING OR  INSOMNIA 90 tablet 2   insulin  aspart (NOVOLOG  FLEXPEN) 100 UNIT/ML FlexPen INJECT SUBCUTANEOUSLY 10 TO 30  UNITS IN THE MORNING AT NOON IN  THE EVENING AND AT BEDTIME 120 mL 2   insulin  glargine (LANTUS  SOLOSTAR) 100 UNIT/ML Solostar Pen Inject 30 Units into the skin daily. 30 mL 3   lurasidone  (LATUDA ) 80 MG TABS tablet TAKE 2 TABLETS BY MOUTH DAILY  WITH BREAKFAST 60 tablet 2   metFORMIN  (GLUCOPHAGE -XR) 500 MG 24 hr tablet TAKE 2 TABLETS BY MOUTH TWICE  DAILY WITH MEALS 360 tablet 2   modafinil  (PROVIGIL ) 200 MG tablet Take 1 tablet (200 mg total) by mouth daily. 30 tablet 5   rosuvastatin  (CRESTOR ) 20 MG tablet Take 1 tablet (20 mg total) by mouth daily. 90 tablet 3   sildenafil (VIAGRA) 100 MG tablet Take 0.5-1 tablets (50-100 mg total) by mouth daily as needed. 10 tablet 11   Tazarotene  (ARAZLO ) 0.045 % LOTN Apply 1 Application topically at bedtime. Start by using it 2x per week for 1 month, then move to every other night 45 g 5   triamcinolone  (NASACORT) 55 MCG/ACT AERO nasal inhaler Place 2 sprays into the nose daily. 1 each 12   valsartan  (DIOVAN ) 160 MG tablet Take 1 tablet (160 mg total) by mouth daily. 90 tablet 3   No current facility-administered medications for this visit.    No current facility-administered medications on file prior to visit.    Allergies  Allergen Reactions   Accutane [Isotretinoin]     Pancreatitis    Divalproex Sodium Other (See Comments)     unknown   Methylphenidate Hcl     Aggravate bipolar   Oxycodone Hcl     REACTION: hallucinations   Ozempic  (0.25 Or 0.5 Mg-Dose) [Semaglutide (0.25 Or 0.5mg -Dos)] Nausea Only   Paroxetine     Aggravate bipolar disorder   Rosuvastatin  Other (See Comments)    Myopathy -muscle cramps, elevated CK   Family History  Problem Relation Age of Onset   Diabetes Mother    Breast cancer Mother        was told not hereditary   Cirrhosis Mother        fatty liver   High blood pressure Mother    High blood pressure Father    Diabetes Father    Melanoma Maternal Uncle    Colon cancer Neg Hx    Stomach cancer Neg Hx    Pancreatitis Neg Hx    Heart disease Neg Hx    Kidney disease Neg Hx    Liver disease Neg Hx    PE: BP 120/70   Pulse 83   Ht 5' 7 (1.702 m)   Wt 229 lb 6.4 oz (104.1 kg)   SpO2 97%   BMI 35.93 kg/m   Wt Readings from Last 3 Encounters:  09/15/24 229 lb  6.4 oz (104.1 kg)  09/06/24 226 lb (102.5 kg)  05/12/24 228 lb (103.4 kg)   Constitutional: overweight, in NAD Eyes: EOMI, no exophthalmos ENT:no thyromegaly, no cervical lymphadenopathy Cardiovascular: RRR, No MRG Respiratory: CTA B Musculoskeletal: no deformities Skin:no rashes Neurological: no tremor with outstretched hands  ASSESSMENT: 1. DM2, insulin -dependent, uncontrolled, without long term complications, but with hyperglycemia  His test were negative for type 1 diabetes: Component     Latest Ref Rng 05/28/2015  C-Peptide     0.80 - 3.90 ng/mL 2.88  Glucose, Fasting     65 - 99 mg/dL 879 (H)  Glutamic Acid Decarb Ab     <5 IU/mL <5  Pancreatic Islet Cell Antibody     < 5 JDF Units <5   - His diabetes is difficult to manage because of multiple intolerances: - We tried to add Invokana  but he did not tolerated due to increased urination.  - We cannot use a DPP 4 inhibitor or a GLP-1 receptor agonist due to his history of pancreatitis.  - He had to stop Actos  >>  abd. Pain - we stopped Cycloset   >> expensive and not effective - we tried Glipizide  >> GI sxs: N/V/D - he tried the Dexcom G6 CGM but he felt that this was giving him a lot of errors and stopped  2. HTG  3.  Obesity class 2  4.  Subacute thyroiditis  PLAN:  1. Patient with history of uncontrolled type 2 diabetes, on oral antidiabetic regimen with metformin , SGLT2 inhibitor and basal-bolus insulin  regimen, with again improvement in diabetes control after he started to improve his diet and started exercising before last visit.  HbA1c was still above target, at 7.6%, but improved.  He was still reporting increased urination with Jardiance  so we discussed about reducing the dose further.  I advised him to take half of a 10 mg tablet and to increase to 10 mg as tolerated.  He was varying the dose of Lantus  quite significantly, between 20 and 40 units and we discussed about decreasing the dose due to the improvement in blood sugars.  Since he had some low blood sugars in the 70s, we also decreased his NovoLog . -Few days ago he just had another HbA1c which was higher, at 8.2%. -He initially did well after our last visit, improving diet and losing weight, however, since his nephew started to live with him and his parents 2 months ago, he is eating more fast food and also more candy around the holidays.  Sugars increased and he gained the weight back.  He had to increase his dose of insulin  and now tapering it down again.  I definitely advised him to try to avoid fast food and candy but discussed about staying with higher doses of NovoLog  and also the higher dose of Lantus .  I also advised him to increase Jardiance , since he is still on the very low dose, 5 mg daily due to increased urination.  He did notice that his urination is more frequent when he is having artificial sweeteners.  He is trying to avoid these. - I advised him to: Patient Instructions  Please continue: - Metformin  ER 2000 mg at dinnertime - Novolog  before meals 25-30  units  - Lantus  40 units at bedtime  Increase: - Jardiance  10 mg daily in am  Please return in 3-4 months with your sugar log.   - advised to check sugars at different times of the day - 4x a day, rotating check  times - advised for yearly eye exams >> he is UTD - return to clinic in 3-4 months  2. HL - With predominant hypertriglyceridemia - Reviewed latest lipid panel was reviewed from earlier this month, much improved from before, with triglycerides decreased from 313 to 192 and LDL improved from 171 to 82: Lab Results  Component Value Date   CHOL 165 09/06/2024   HDL 44.60 09/06/2024   LDLCALC 82 09/06/2024   LDLDIRECT 171.0 03/03/2024   TRIG 192.0 (H) 09/06/2024   CHOLHDL 4 09/06/2024  - The above lipid panel improved after he was started back on statin and fibrate: Crestor  20 mg daily now and gemfibrozil  600 mg 2x a day - Of note, he had very high triglycerides, in the 600s while on Accutane. - He previously had muscle cramps and elevated CK but at last visit this had resolved, but they reoccurred afterwards. - He also had a very low vitamin D , initially 8.8, then increased to 16.48 in 02/2024 and we discussed that a low vitamin D  can predispose him to muscle cramps.  Latest vitamin D  level improved to 29.7 earlier this month  3.  Obesity class 2 - He gained a significant amount of weight on Latuda .  He needs to get a snack with 350 calories at bedtime when he takes Latuda . - We cannot use Ozempic  due to history of pancreatitis and GI symptoms when he actually tried it.  A lipase level was elevated 09/04/2023, at 115. - He continues on Jardiance  which should also help with weight loss but we have to use at half maximal dose due to increased urination. - He continues to use a CPAP for OSA.  He feels that this and modafinil  made a great difference in his energy levels. - His weight is usually fluctuating, due to his psychotropic medications and also dietary indiscretions including  black currant syrup and we again discussed about stopping this completely.  Since last visit, he relaxed diet with fast food and candy.  We again discussed about improving the diet (no fried foods, reducing sweets) and exercising at least 30 minutes a day.  4.  Subacute thyroiditis - Diagnosed by uptake and scan - Latest TFTs showed a normal TSH earlier this month - No thyrotoxic signs or symptoms - Will continue to follow him expectantly  Lela Fendt, MD PhD [facility] Endocrinology

## 2024-09-15 NOTE — Patient Instructions (Addendum)
 Please continue: - Metformin  ER 2000 mg at dinnertime - Novolog  before meals 25-30 units  - Lantus  40 units at bedtime  Increase: - Jardiance  10 mg daily in am  Please return in 3-4 months with your sugar log.

## 2024-09-29 ENCOUNTER — Encounter: Payer: Self-pay | Admitting: Internal Medicine

## 2024-09-30 MED ORDER — MONTELUKAST SODIUM 10 MG PO TABS: 10.0000 mg | ORAL_TABLET | Freq: Every day | ORAL | 11 refills | Status: DC

## 2024-10-04 ENCOUNTER — Ambulatory Visit: Admitting: Mental Health

## 2024-10-04 DIAGNOSIS — F3110 Bipolar disorder, current episode manic without psychotic features, unspecified: Secondary | ICD-10-CM | POA: Diagnosis not present
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
## 2024-10-10 ENCOUNTER — Other Ambulatory Visit: Payer: Self-pay | Admitting: Psychiatry

## 2024-10-10 DIAGNOSIS — L299 Pruritus, unspecified: Secondary | ICD-10-CM

## 2024-10-14 ENCOUNTER — Encounter: Payer: Self-pay | Admitting: Dermatology

## 2024-10-14 ENCOUNTER — Encounter: Payer: Self-pay | Admitting: Internal Medicine

## 2024-10-14 DIAGNOSIS — J309 Allergic rhinitis, unspecified: Secondary | ICD-10-CM

## 2024-10-17 ENCOUNTER — Telehealth: Payer: Self-pay

## 2024-10-17 NOTE — Telephone Encounter (Signed)
 Pt is already has a new pt appointment on 10/25/24

## 2024-10-17 NOTE — Telephone Encounter (Signed)
 Copied from CRM #[id number]. Topic: Appointments - Transfer of Care >> Oct 17, 2024 10:36 AM Antony RAMAN wrote: Pt is requesting to transfer FROM: [facility] Pt is requesting to transfer TO: joanna inman Reason for requested transfer: pt request It is the responsibility of the team the patient would like to transfer to (Dr. lida) to reach out to the patient if for any reason this transfer is not acceptable.

## 2024-11-02 ENCOUNTER — Encounter: Payer: Self-pay | Admitting: Internal Medicine

## 2024-11-02 ENCOUNTER — Other Ambulatory Visit: Payer: Self-pay | Admitting: Psychiatry

## 2024-11-02 DIAGNOSIS — F3110 Bipolar disorder, current episode manic without psychotic features, unspecified: Secondary | ICD-10-CM

## 2024-11-02 DIAGNOSIS — E1165 Type 2 diabetes mellitus with hyperglycemia: Secondary | ICD-10-CM

## 2024-11-06 ENCOUNTER — Other Ambulatory Visit: Payer: Self-pay | Admitting: Internal Medicine

## 2024-11-07 ENCOUNTER — Telehealth: Payer: Self-pay | Admitting: Dermatology

## 2024-11-07 ENCOUNTER — Encounter: Payer: Self-pay | Admitting: Dermatology

## 2024-11-07 NOTE — Telephone Encounter (Signed)
 Called patient in response to his MyChart message on more information regarding how to use his medications for acne. Patient was advised on how often to apply topicals and to take oral medications. Patient understood.

## 2024-11-08 ENCOUNTER — Telehealth: Payer: Self-pay | Admitting: Psychiatry

## 2024-11-08 ENCOUNTER — Other Ambulatory Visit (HOSPITAL_COMMUNITY): Payer: Self-pay

## 2024-11-08 ENCOUNTER — Telehealth: Admitting: Psychiatry

## 2024-11-08 ENCOUNTER — Telehealth: Payer: Self-pay

## 2024-11-08 ENCOUNTER — Encounter: Payer: Self-pay | Admitting: Psychiatry

## 2024-11-08 DIAGNOSIS — G4733 Obstructive sleep apnea (adult) (pediatric): Secondary | ICD-10-CM

## 2024-11-08 DIAGNOSIS — F3110 Bipolar disorder, current episode manic without psychotic features, unspecified: Secondary | ICD-10-CM | POA: Diagnosis not present

## 2024-11-08 DIAGNOSIS — F411 Generalized anxiety disorder: Secondary | ICD-10-CM

## 2024-11-08 DIAGNOSIS — F902 Attention-deficit hyperactivity disorder, combined type: Secondary | ICD-10-CM | POA: Diagnosis not present

## 2024-11-08 DIAGNOSIS — F5105 Insomnia due to other mental disorder: Secondary | ICD-10-CM | POA: Diagnosis not present

## 2024-11-08 DIAGNOSIS — F1211 Cannabis abuse, in remission: Secondary | ICD-10-CM | POA: Diagnosis not present

## 2024-11-08 DIAGNOSIS — F4001 Agoraphobia with panic disorder: Secondary | ICD-10-CM | POA: Diagnosis not present

## 2024-11-08 MED ORDER — LUMATEPERONE TOSYLATE 42 MG PO CAPS: 42.0000 mg | ORAL_CAPSULE | Freq: Every day | ORAL | 1 refills | Status: DC

## 2024-11-08 NOTE — Progress Notes (Signed)
 Cody Short [id number] 12-Feb-1976 49 y.o.    Subjective:   Patient ID:  Cody Short is a 49 y.o. (DOB January 03, 1976) male.  Chief Complaint:  Chief Complaint  Patient presents with   Follow-up   Manic Behavior   Depression   ADD   Anxiety     Cody Short presents to the office today for follow-up of several psychiatric diagnoses ...    seen August 2020.  For obsessive anxiety  Switched Lexapro  5 mg daily on May 18.  But he stated it made him hungry and so it was stopped in August.  He continued on Saphris  10 mg nightly, lithium  1200 mg daily, propranolol  as needed tremor.  Lithium  level was ordered.  He called Sept saying he was manic.  Had a lithium  level 0.7 and so dose was increased to 1500 mg daily.    seen December 21, 2019.  He had some ongoing manic symptoms and Saphris  was increased to 15 mg nightly.  02/07/2020 appointment with the following noted: He has made multiple phone calls to the office since that date.  He decided he wanted to stop lithium  because of urinary problems.  He was warned this could lead to mania but he wanted to pursue it anyway.  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  Cody Short had been having bedwetting episodes which she attributed to the lithium .  He has not consistently taken Saphris  15 mg at bedtime since the office visit in February. He completely stopped lithium  January 30, 2020 U frequency is much better and less thirsty.  Bedwetting resolved.  Reduced BM to 1-2 times daily.  Gassy.  Glucose is high.  B is hospitalized with unknown disease.  Insulin  adjusted. Mental status is best it's ever been.  Less mania.  Less obsessing.  No worsening mania. Is taking Saphris  15 mg HS> Plan no med changes.  04/10/2020 appointment with the following noted: No longer on lithium .  Still frequent urination and thirst DT DM poorly controlled despite meds.  Endo says little else to  take bc history of pancreatitis.  Says he goes to urinate q 15 mins. Plan: Wean Saphris  in crossover to loxapine  150 mg HS over 2 weeks bc he and endocrinologist believe it's likely Saphris  is cause of the inability to get his glucose to acceptable levels.   06/07/20 appt with the following noted: He's called a couple of times CO sleepiness and reduced loxapine  to 50 mg HS. Started Vegan diet and BS is better 4 weeks ago.  Thinks glucose is better off the Saphris . Lost 6 #.   CO sleepiness and sleep 16 hours   He thinks it also drove up pulse and blood pressure. Asks about taking Saphris  10 and a second medication. Mood has been fine.   Plan: He wants to restart Saphris  10 mg HS and use a 2nd mood stabilizer. Start Saphris  and reduce Loxapine   To 10 mg at night DT prior failure of 10 Mg Saphris  with lithium .  06/27/2020 phone call from patient: Pt called to report Loxapine  & Saphris  is causing his BP to be very high and Saphris  is causing blood sugar to be high. Apt 10/18. Need changes before then. Call back @ [phone]  MD response: He had wanted to go back on Saphris  and reduce loxapine  which we did.  Tell him to stop the Saphris  and increase loxapine  to 20 mg nightly.  He did not tolerate 50  mg of loxapine  very well. Nurse TC withpt: Spoke with Cody Short and he will stop the Saphris , but feels the increase in the Loxapine  to 20 mg will increase his blood sugars more. Explained that with coming off the Saphris  he needs to increase it a bit or he will not be stable with his moods. Advised him to stop the Saphris  and would check with Dr. Geoffry about any other recommendations.   07/17/20 appt with the following noted: Doing terrible.  Needs melatonin to sleep.  Claims loxapine  is elevating BP, pulse, TG, cholesterol, glucose.  Claimed Saphris  did it too.   Sees PCP at end of October.   Started Vegan diet about 6 weeks ago and exercising.   A/P: There are no available mood stabilizers that do not have  a warning of elevating blood sugar and cholesterol.  He has tried all the mood stabilizers that do not have this warning.  All of the available mood stabilizers left that have any effectiveness are antipsychotics and the FDA has required a class warning on all antipsychotics about blood sugar and cholesterol effects.  This is true despite the fact that not all antipsychotics are equal at increasing these risks.  The patient's reading of these potential side effects is complicating finding an effective treatment.  The only way to prove whether or not these meds are elevating his blood sugar and cholesterol is to stop the medications for a period of time.  This exposes him to manic risk but there is no chance of finding an adequate mood stabilizer that will satisfy him until we can prove whether or not these meds are in fact affecting his blood sugar, cholesterol, blood pressure, and pulse. Therefore he will stop the loxapine  which is at a low dose. We will check labs in 3 weeks.  We will attempt follow-up in 4 weeks.  If he gets manic he will let us  know.   08/13/2020 appointment with the following noted: Blood sugar is pretty much the same off the loxapine  but BP and pulse improved from 110 to 70 range.  But had started BP med about mid September. Started counselor Cheryal Sauer at [facility] and started nutritionsist.  Has changed and restricted diet. Sees PCP 07/24/20.  Endo is managing DM.   Mood has been really good.  Counseling helped the anxiety.  Getting 10 hours of sleep.  PCP suggested changing time going to bed earlier and it's helping.   Sometimes has racing thoughts and gets comments from others while playing video games that he needs to take a break. Patient reports stable mood and deniesr irritable moods.  Denies appetite disturbance.  Patient reports that energy and motivation have been good.  Patient denies any difficulty with concentration.  Patient denies any suicidal  ideation.  Admits he's easily stressed.   10/09/20 appt with following noted: Getting manic as hell and depressed as hell. Talking too much and obsessing over things.  Talking too loud and doing things extremely fast.  Only depressed a couple of days ago.  Manic sx are brief and not all the time. Sleep 7-8 hours and sleep schedule is more normal. Stayed on Caplyta  and pleased overall with SE. BS is better but hypertension is ongoing. Low TSH. Still counseling. No delusions hallucinations since off drugs. Give samples of Caplyta  42 mg once daily. Prefer no change befor ethe holidays bc he is not continuously manic or depressed. Likely switch to Latuda  after the holidays.  11/19/2020 appointment with  the following noted: Best I've ever been except some controllable manic.  Able to do things fast and easily.  Anxiety medicine and counseling has helped.  Better function.  No SE. Not depressed in a while.  But not sure the Capylta helps mania.   Lost from 230# to 189# with diet and exercise over a long period.   Sleep 4-8 hours, but not that I can't sleep but don't feel like sleeping.  Not itching any more.  I'm a a whole new person.   I can't keep up the mania.  Driving my parents and friends crazy. Blood sugar better and off insulin .  Only taking metformin . Can't afford Caplyta . So feels he must change. Hydroxyzine  really helped anxiety. Plan: DC Caplyta  DT mania.  switch to Latuda  40 then 80 mg with food.  Pick up samples.  12/03/2020 phone call from parents:Parents called and want dr. geoffry to know that Cody Short is maniac and he is having physical aggression and all he does is play video games 24 hours a day and is not sleeping. Parents feel like Cody Short is not giving you the whole picture and needs an adjustment to his medicine. MD response:atient acknowledged that his appointment on January 24 that he was manic while taking Caplyta .  He also acknowledged that his family saw him as being manic.  We  discontinued Caplyta  and switched him to Latuda .  If he made the switch and increase the dose in a timely manner he should have been on 80 mg daily for a week now.  His parents are complaining that he is still manic.  Provided he is tolerating the Latuda  reasonably well have him increase to 120 mg daily.  He can take 1-1/2 of the 80 mg tablets until he runs out and then we can send in a refill for 120 mg daily.  Remind him he needs to take that with at least 350 cal. Phone call with patient from nurse: Patient rtc and he did report he's doing great on the medication but is still having mania. Reports staying up all night playing video games then asleep about 5 am and back up at noon. He is taking medication with 350 calories also. Advised him to increase to 120 mg Latuda  daily. He reports having only 40 mg tablets at home so instructed him to take 3 tablets and I would get more samples out for him to pick up tomorrow. He agreed and verbalized understanding.  12/07/2020 phone call: Patient called again about his Latuda . He states that it makes him really hungry after he takes it, he takes a nap and wakes up hungry again. He would like to know would it be okay to take before bed instead. MD response: He is unlikely to comply with recommendations about routine sleep and wake times.  He is in a habit of playing video games late into the night.  If he prefers he can split the dosage of the Latuda  between breakfast and another meal.  But it is very important that he get the full dose in every day and take it with 350 cal. Nursing phone call with patient:Rtc to patient and he reports he has to take it all at hs because it makes him sleepy, I advised him to continue that regimen. He also reports not sleeping well but he's been drinking caffeine later in the day. Advised him to not drink any caffeine past 2-3 pm. He agreed. He reports his blood sugars have been elevated some,  not as bad but it is up some.   12/20/2020  appointment with the following noted: Mania tremendously better but occ anger outbursts anger.  6 hours of sleep with melatonin, hydroxyzine  and Latuda  120 mg for 2 weeks.  Staying up all night playing video games. Latuda  does make him hungry right after he takes it so takes it before bedtime.  Taking with food. Hyperactivity and loud talking is better.  I can function fast.  Parents see benefit with mania but are concerned about his anger outbursts but he things she takes stress out on him.  Blood sugar is generally good except when eats too much.  Taking it with dinner and HS.   Able to drive around town.  Can cook and clean.  Less ruminating on his past.  Less need for direction.   Counseling is helping at [facility] counseling. Hydroxyzine  helped itching and anxiety.  Plan: Continue Latuda  120 mg PM.  Is getting benefit but only been on it for a couple of weeks.   01/17/2021 appointment with following noted: So so.  Some anger outburts a couple of times.  Eats too much.  Blood sugar is pretty bad.  Up at night playing video games and then sleeps too late in the day.  Takes Latuda  at evening meal.  Wants to blame meds for these behaviors. Otherwise mood is good I guess.  No serious highs or lows.  Can get in arguments with mother when she tries to correct him and may feel guilty afterwards. Takes hydroxyzine  q 6 hours because of anxiety and bc dx thyroid  inflamed, thyroiditis.  Asks if there's anything to help with overeating. Lost weight from thyroid .   Plan: Continue Latuda  120 mg as he is tolerating it and it appears to be controlling mania better than did the Caplyta  and he is tolerating it better.  02/12/2021 appointment with the following noted: Perfectly good with mood and depression.  Needs hydroxyzine  for itching and anxiety.  Energy poor with thyroiditis. Will sleep too much too bc gets bored and no physical activity. Varies 9-16 hours daily.  Can nap easily.  Not spacy.   No  anger lately. Tolerating Latuda  well.   Still eats too many sweets late at night and gets high blood sugars.   Plan: Increase  topiramate  50 mg BID off label for weight loss and disc SE.  He wants to try something.  03/20/2021 appointment with the following noted: Taking meds.  A little benefit with increase topiramate . Mood is perfectly fine without mania nor depression. Blood sugar is a bit high. Claims doctor thinks rash could be related to Latuda  but he sleeps all the time bc hypothyroidism. Has folliculitis Started treatment for it and it is helping.   Thyroid  problem is a wait and see.  Could take a year to get better. Doesn't think topiramate  added tiredness. Not taking hydroxyzine  much about once daily. Dealing with difficult GM threatening sui if left alone.  Plan: Continue Latuda  120 mg PM.  Is getting benefit.. Better tolerated than others so far but he does say it makes him hungry but he has chronic impulse control problems including overeating.  05/06/2021 appointment with the following noted: Quit topiramate  DT nausea and not helping at all. Rash resolved. Lipids not terrible but a little up.  TSH normalized. HGBA1C 9.3 Not exercising. Not doing much re: work etc other than video games. Not manic lately. Patient reports stable mood and denies depressed or irritable moods.  Patient denies  any recent difficulty with anxiety.   Denies appetite disturbance.  Patient reports that energy and motivation have been good.  Patient denies any difficulty with concentration.  Patient denies any suicidal ideation. Energy is better than it was but still sleeping 12 hours daily and up at night playing videos with friends. Plan: Reduce Latuda  to 80 mg to reduce excessive sleepiness.  If mania then increase it back up 120 mg PM.  Is getting benefit..  06/20/21 TC:  Pt stated the cramps have spread to his arms and legs.He has been on latuda  80 mg qd for awhile now.He stated he is also going to  see a cardiologist due to heart spasms. MD response:  The cramps are not likely related to Latuda .  Usually it is related to dehydration although I see from chart review that he had elevated creatinine kinase from a statin and that was probably causing some of his problem.  He needs to make sure he drinks lots of water.  Especially because he is diabetic and that will make him prone to dehydration if he is not controlling his blood sugar. However the sleepiness might be related to Latuda .  He had previously been on 120 mg daily and we reduced it to 80 mg daily and if he is done okay with his mood we can try reducing it 1 more time to 60 mg.  I will send in a prescription for the 60 mg tablet and he can reduce it if he would like to see if the sleepiness is related to Latuda .  If Latuda  is contributing to sleepiness it will get better if he reduces the dose.  If the sleepiness does not get better with the dosage reduction then Latuda  is not the cause Reduce Latuda  to 60 mg daily  07/30/2021 appt noted: Not much difference in alertness Mania since reducing Latuda . Hypoactive bc thryroid problems and may be the cause of the cramps per the other doctors. Itches badly without hydroxyzine . Spent $500 on gambling game and adeleted.  Past history of drug induced gambling but no major drugs in 15 years. Still exhausted and sleep all day.  But is taying up at night.  Sleeping 16 hours daily.  Wonders if hydroxyzine  25 is making him tired. Itching worse with stress and stress of GM and B crazy. Asks about bipolar shot. Side sleeper and as far as he knows no excessive snoring.  08/23/2021 phone call from patient's father stating that Cody Short had gone off the rails and it spent $1000 on the night prior doing online gambling and had gone back to staying up all night and sleeping during the day.  He had also been more verbally abusive.  It was suggested that the Latuda  dose be increased from 60 to 80 mg in the  evening.  10/09/2021 appointment with the following noted: Sleeping a lot and easily exhausted even after med were changed.   SE a little constipation. History of problems with gambling in the past but not current.  Not thinking of it now. No other manic sx currently.   On Latuda  80 daily now.  Reduced hydroxyzine  to 10 and still sleepy.  No other psych meds Plan: Continue Latuda  80 mg bc manic with less .  If recurrence of mania then increase it back up 120 mg PM.  Is getting benefit..  11/18/2021 phone call: Patient had complained of elevated CK levels and wondered if it could be related to Latuda .  Dr. Geoffry called Dr. Waylan  and discussed this issue.  It appears that his CK levels have actually been declining over the last several months and are not at a critical level.  It was agreed that this could be monitored over time and no immediate med change was necessary.  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   1 episode when got angy and said awful things to mother but then apologized.  Only 1 bad day. Otherwise mood has been pretty stable lately. Constipation and can't take miralax bc gives him diarrhea.   Sleep normalized to 8 hours since started his job.  So the med was not the source of the sleepiness. Counselor next Tuesday Only hydroxyzine  is 10 mg HS Only other psych med is Latuda  80 mg daily Plan: Latuda   Been the best med so far for him.     Sleepiness was not better with reduction in Latuda  from 120 to 60 mg daily. Continue Latuda  80 mg bc manic with less .  Option split dose to help sleepiness .   03/12/22 appt noted: Taking hydroxyzine  BID Sleep study next week. 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  At [employer]. Doing well emotionally. Constipation and GI doc tomorrow DT fissure. BS is better A1C 7.4 instead of 9+ Sleep 8 hours.   The only thing he doesn't like is 4 meals daily bc hard to lose weight. Not much mania nor depression. Still anxious and worse without hydroxyzine . Consistent with Latuda  at bedtime with food.   Rash bettter .  Itching managed with BID hydroxyzine . Plan: Continue Latuda  80 mg bc manic with less .  Option split dose to help sleepiness .  If recurrence of mania then increase it back up 120 mg PM.  Is getting benefit..  06/23/22 appt noted: Rough 2 weeks.  Clemens for 47 yo girl at work and was somewhat manic with her and she quit the job.  But is beginning to feel better now. Dx OSA CPAP and tremendous difference.  Not sleeping as much and more productive at work.  But still some initial sleepiness in the AM Not constipation or diarrhea but slow to move bowels but takes several times in the AM. Started Colace  2 daily for the last couple of days. Lost 10-15# and now stagnant. Working and eating less and weights off and on 3 weeks and trouble losing weight. At work asking too many questions and is too anxious.  Wonders about more hydroxyzine . Sober for 12 years. Can't split Latuda  bc gets too sleepy and takes it too much at night. Had same job 6 mos.  Got employee of the month. Plan: no changes  07/08/22 tC CO more manic sx 07/30/22 TC: Pt stated generic latuda  is not working for him.He is having manic symptoms such as cleaning his whole house,getting into arguments with his mom and getting agitated very easily.He also gambled $300.He is taking 80 mg  MD response : incr Latuda  to 120 mg daily  09/25/22 appt noted: It made all the difference in the world with incr Latuda  to 120 mg daily.  No sig mania or depression now. Increased stool softeners to 3 twice daily.  BS better  with Jardiance  and wt loss 19#. Tolerating Latuda .    No delusions or visions like before and no trouble some intrusive thoughts. Rash is better with clindamycin  solution. Busy with work.  [employer] employee of the month twice at [employer].  37 hours per month. Sleep 8-9 hours. Hydroxyzine  10 HS helps sleep and can't take in daytime. SE can't lose wt on Latuda  with medical help. Plan no med changes  02/04/23 TC: Patient is taking hydroxyzine  and Latuda . He says he takes as prescribed, taking with enough calories. There was a work situation that caused a lot of stress and he was able to talk to someone to calm him down and provide clarity on the situation. He said that he spent $1000 on a gambling game that didn't even provide any pay out. He also said he got into an argument with 2 different customers and that he doesn't usually do this. He is also describing depression. He said he is sleeping 9 hours a day. Patient does not have pressured or rapid speech, seems to have a calm affect, but is concerned.     MD resp:  I'm out of town.  Only option I can give now is to increase the Latuda  to 1 and 1/2 of the 120 mg tabs temporarily until mania resolves     02/04/23 appt noted: in person Increased Latuda  to 180 mg daily since ehre bc manic sx. Hydroxyzine  10 HS hangover so taking 10 HS Now also Lautda 120 mg daily. Doing good overall.  No sig mania lately. Few days mild dep but not bad.   Some constipation issues.  Takign stool softeners.   Therapy with [facility] is helping a lot. Wonders if Latuda  speeds up sex response.  No compulsive sex behaviors. Lost $7000 on gambling game.  Got banned from game and no other gambling problems now.   B drug addict and causes problems and asking for money and lying.   Pt continues to work with occ problems with customers.  Surveyor, [employer].   Sleep 8 hours.   Gm narcissist and psychopath and hypochondriac 49 yo Plan no changes  07/07/23 appt noted: Doing really well. Accutane  several mos.  Rash 90% better. Constipation better with stool softeners. Not much mood swings but dep occ.  7-8 hours sleep. Uses hydroxyzine  for anxiety bc it remains high.  Too tired if more than 20 mg at a time. Meds Latuda  120 mg daily and hydroxyzine  10 BID.   Might have to go on Medicaid bc income reasons. Therapy with Medford Fischer helped more than any other therapist.   Covid shut down Dad's business. B returned to drug use.  He's neglecting his kid.  B is big stress.   GM died 4 mos ago.   Can be impulsive and prone to addiction. Plan: ADD tx is reasonable bc job affected by difficulty with handling two tasks at once.  Forgetful and can't get back on task.  Work restrictions bc of it.  No regular stimulant. Ok trial modafinil  off label for this problem.  Also to help OSA. Modafinil  100-then 200 mg AM.  09/09/23 appt noted: Modafinil  is the best thing that ever happened to me.  Better focus, alertness, and function.  ADD completely fixed.  More productive and boss noticed.   No SE.   Flare accutane causing pancreatitis.  Went to ER.  Scared of it and that it might be fatal.   Constipation resolved.   Lost 30#  in 6 mos. CPAP is better and sleep better.  Only 6-7 hours of sleep and was 8-9 hours.   Change to MCD 09/27/23.   Changing only PCP and keeping other doctors.   Better with wt loss and glucose now normal.  Dieting and exercise.  Losing more wt.   Psych meds: modafinil  200, Lurasidone  120 daily. Hydroxyzine  10 mg prn,   12/10/23 appt noted: Med: Latuda  160 mg daily, modafinil  200 mg AM Raised Latuda  to 160 mg daily. Dealing with family and customers a million times better.  Mood overall is better with work and family.  My whole life is better with increase Latuda .  Less lashing out at people. B is a ongoing problem at home. Cognitive distortions.   Working on those. SE some wt gain.   Stay up late if no work.  If works then 8 hour sleep.  Will nap if sleep deprived.   Using CPAP more.  Had FU yesterday and doing well with. Still loves the modafinil  for focus and alertness.   B using crack and threatens them if not giving him money.   06/09/24 appt noted:  Med: Latuda  160 mg daily, modafinil  200 mg AM, hydroxyzine   20 HS Doing alright but walked out on job bc can't deal with public.  Last work date around first of May.  I can't handle it at all.  Low tolerance dealing with the public.  Will yell at people.  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  Wants to pursue disability.  Has attorney with [employer] [place].  Doesn't think he could work any other job. Some spending px but not gambling px lately. Dep a lot worse at night.  Napping too much.  Stays up at night. 2 hydroxyzine  helps sleep and anxiety. B driving them crazy.   No problems with meds.  No drug use.  Hard to lose wt bc needs to take Latuda  late in the day.  3-6 cups coffee daily and sometimes energy drinks. Plan: Ok trial Wellbutrin  Xl 150 AM.  Disc risk SE esp with caffeine.  He wants to try it  08/08/24 appt noted:  Med: Latuda  160 mg daily, modafinil  200 mg AM, hydroxyzine   20 HS, Wellbutrin  stopped Thinks wellbutrin  elevated his blood sugar and it got better when he stopped Wellbutrin . Helped his dep only a little.   Still gets bipolar mood swings every night when gets dep about his life.  Mind will race on things that happened a long time ago.  Making him more depressed.   Stays up all night streaming video games and talks all night to people.  Then will sleep 8 hours and get up and eat and sleep about 2-3 hours.   Stress taking care of brother's 58 yo son.  The mother in rehab and B poor function. Working in therapy on family issues.  B still abusing drugs and seeking $ from them for drugs.   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  Plan no changes  11/08/24 appt noted:  Med: Latuda  160 mg daily, modafinil  200 mg AM, hydroxyzine   20 HS,  Doing so so.  Spend 80% of time in bed or depressed.  Hard to push himself to function.  Trying to add in other activities.  Hard to  motivate for anything.   Not working.   Doing some counseling which helps some to let things go.  Most of dep comes from things from his past.  Reports Caplyta  helped dep but did not help on manic side of sx.  Asks about retrying.  DM poorly controlled bc low motivation to exercise.   Tremor resolved on this med combo. Sleep 14-16 hours.     Prior psychiatric medications: risperidone with cognitive side effects, Abilify NR,  Zyprexa SE glucose, Seroquel 600 mg SE wt, perphenazineSE, Saphris  20 partial response but SE increase glucose , haloperidol EPS, Geodon EPS,  Vraylar, loxapine  SE, Caplyta  NR,  Latuda  160 good resp  He's prone to EPS.  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 lithium  1500,   N-acetylcysteine with minimal benefit, ,  Wellbutrin  SL 150 he thought it elevated BS Modafinil  200 helped a  lot Amantadine with vomiting.  Remote Wellbutrin  years ago. Sertraline  and lexapro  sexual SE,   Propranolol  for tremor,   Ozempic  GI px   Review of Systems:  Review of Systems  Constitutional:  Positive for fatigue.  Gastrointestinal:  Positive for constipation and rectal pain.  Endocrine: Positive for polydipsia and polyuria.  Genitourinary:  Negative for decreased urine volume and frequency.  Musculoskeletal:  Positive for myalgias.  Skin:  Positive for rash.       Itching resolved with hydroxyzine   Neurological:  Negative for tremors and weakness.  Psychiatric/Behavioral:  Positive for decreased concentration and dysphoric mood. Negative for agitation, behavioral problems,  confusion, hallucinations, self-injury, sleep disturbance and suicidal ideas. The patient is hyperactive.     Medications: I have reviewed the patient's current medications.  Current Outpatient Medications  Medication Sig Dispense Refill   Accu-Chek Softclix Lancets lancets Use as instructed to check blood sugar 4 times daily. DX:E11.65 300 each 3   Blood Glucose Monitoring Suppl (CONTOUR NEXT MONITOR) w/Device KIT Check sugar 4 times daily 1 kit 0   Blood Glucose Monitoring Suppl (FREESTYLE LITE) DEVI Use as instructed to check sugar 4 times daily 1 each 0   Clindamycin  Phos-Benzoyl Perox (ONEXTON) 1.2-3.75 % GEL Apply 1 Application topically in the morning. 50 g 4   cyclobenzaprine  (FLEXERIL ) 5 MG tablet Take 1 tablet (5 mg total) by mouth 3 (three) times daily as needed. 40 tablet 1   doxycycline  (VIBRAMYCIN ) 50 MG capsule Take one tablet daily with large meal 30 capsule 6   empagliflozin  (JARDIANCE ) 10 MG TABS tablet Take 1 tablet (10 mg total) by mouth daily before breakfast. 90 tablet 3   fexofenadine  (ALLEGRA ) 180 MG tablet Take 1 tablet (180 mg total) by mouth daily. 30 tablet 5   gemfibrozil  (LOPID ) 600 MG tablet Take 1 tablet (600 mg total) by mouth 2 (two) times daily before a meal. 180 tablet 3   glucose blood (ACCU-CHEK GUIDE TEST) test strip Use to check blood sugar 4 times a day 400 each 12   glucose blood (ACCU-CHEK GUIDE) test strip USE AS INSTRUCTED TO CHECK BLOOD SUGAR 4 TIMES A DAY 300 strip 3   hydrOXYzine  (ATARAX ) 10 MG tablet TAKE 1 TABLET BY MOUTH 3 TIMES  DAILY AS NEEDED FOR ITCHING OR  INSOMNIA 270 tablet 0   insulin  aspart (NOVOLOG  FLEXPEN) 100 UNIT/ML FlexPen INJECT SUBCUTANEOUSLY 10 TO 30  UNITS IN THE MORNING AT NOON IN  THE EVENING AND AT BEDTIME 120 mL 2  insulin  glargine (LANTUS  SOLOSTAR) 100 UNIT/ML Solostar Pen INJECT SUBCUTANEOUSLY 60 UNITS  DAILY 45 mL 3   lurasidone  (LATUDA ) 80 MG TABS tablet TAKE 2 TABLETS BY MOUTH DAILY  WITH BREAKFAST 60 tablet 0    metFORMIN  (GLUCOPHAGE -XR) 500 MG 24 hr tablet TAKE 2 TABLETS BY MOUTH TWICE  DAILY WITH MEALS 360 tablet 2   modafinil  (PROVIGIL ) 200 MG tablet Take 1 tablet (200 mg total) by mouth daily. 30 tablet 5   montelukast  (SINGULAIR ) 10 MG tablet Take 1 tablet (10 mg total) by mouth daily. 30 tablet 11   rosuvastatin  (CRESTOR ) 20 MG tablet Take 1 tablet (20 mg total) by mouth daily. 90 tablet 3   sildenafil  (VIAGRA ) 100 MG tablet Take 0.5-1 tablets (50-100 mg total) by mouth daily as needed. 10 tablet 11   Tazarotene  (ARAZLO ) 0.045 % LOTN Apply 1 Application topically at bedtime. Start by using it 2x per week for 1 month, then move to every other night 45 g 5   triamcinolone  (NASACORT ) 55 MCG/ACT AERO nasal inhaler Place 2 sprays into the nose daily. 1 each 12   valsartan  (DIOVAN ) 160 MG tablet Take 1 tablet (160 mg total) by mouth daily. 90 tablet 3   No current facility-administered medications for this visit.    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 Recurs if cannot walk for a week.      TOBACCO ABUSE, HX OF 07/31/2009    Family History  Problem Relation Age of Onset    Diabetes Mother    Breast cancer Mother        was told not hereditary   Cirrhosis Mother        fatty liver   High blood pressure Mother    High blood pressure Father    Diabetes Father    Melanoma Maternal Uncle    Colon cancer Neg Hx    Stomach cancer Neg Hx    Pancreatitis Neg Hx    Heart disease Neg Hx    Kidney disease Neg Hx    Liver disease Neg Hx     Social History   Socioeconomic History   Marital status: Single    Spouse name: Not on file   Number of children: 0   Years of education: Not on file   Highest education level: Bachelor's degree (e.g., BA, AB, BS)  Occupational History   Not on file  Tobacco Use   Smoking status: Former    Current packs/day: 0.00    Average packs/day: 2.5 packs/day for 10.0 years (25.0  ttl pk-yrs)    Types: Cigarettes    Start date: 10/27/1996    Quit date: 10/27/2006    Years since quitting: 18.0   Smokeless tobacco: Never  Vaping Use   Vaping status: Never Used  Substance and Sexual Activity   Alcohol use: Not Currently    Comment: none at all   Drug use: No   Sexual activity: Not on file  Other Topics Concern   Not on file  Social History Narrative   Single. Not dating currently. No kids.    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   Hobbies: video games PC   Caffeine: 2 C a day   Social Drivers of Health   Tobacco Use: Medium Risk (11/08/2024)   Patient History    Smoking Tobacco Use: Former    Smokeless Tobacco Use: Never    Passive Exposure: Not on Actuary Strain: High Risk (09/02/2024)   Overall Financial Resource Strain (CARDIA)    Difficulty of Paying Living Expenses: Very hard  Food Insecurity: Food Insecurity Present (09/02/2024)   Epic    Worried About Programme Researcher, Broadcasting/film/video in the Last Year: Often true    Ran Out of Food in the Last Year: Sometimes true  Transportation Needs: No Transportation Needs (09/02/2024)   Epic     Lack of Transportation (Medical): No    Lack of Transportation (Non-Medical): No  Physical Activity: Insufficiently Active (09/02/2024)   Exercise Vital Sign    Days of Exercise per Week: 2 days    Minutes of Exercise per Session: 30 min  Stress: Patient Declined (09/02/2024)   [employer] of Occupational Health - Occupational Stress Questionnaire    Feeling of Stress: Patient declined  Social Connections: Moderately Isolated (09/02/2024)   Social Connection and Isolation Panel    Frequency of Communication with Friends and Family: More than three times a week    Frequency of Social Gatherings with Friends and Family: Never    Attends Religious Services: Never    Database Administrator or Organizations: Yes    Attends Banker Meetings: Never    Marital Status: Never married  Intimate Partner Violence: Not on file  Depression (PHQ2-9): High Risk (09/06/2024)   Depression (PHQ2-9)    PHQ-2 Score: 13  Alcohol Screen: Not on file  Housing: Low Risk (09/02/2024)   Epic    Unable to Pay for Housing in the Last Year: No    Number of Times Moved in the Last Year: 0    Homeless in the Last Year: No  Utilities: Not on file  Health Literacy: Not on file    Past Medical History, Surgical history, Social history, and Family history were reviewed and updated as appropriate.   Please see review of systems for further details on the patient's review from today.   Objective:   Physical Exam:  There were no vitals taken for this visit.  Physical Exam Neurological:     Mental Status: He is alert and oriented to person, place, and time.     Cranial Nerves: No dysarthria.  Psychiatric:        Attention and Perception: Attention and perception normal.        Mood and Affect: Mood is anxious and depressed. Affect is not tearful.        Speech: Speech normal. Speech is not rapid and pressured.  Behavior: Behavior is cooperative.        Thought Content: Thought content  normal. Thought content is not paranoid or delusional. Thought content does not include homicidal or suicidal ideation. Thought content does not include suicidal plan.        Cognition and Memory: Cognition and memory normal.        Judgment: Judgment normal.     Comments: Insight intact Mildly intense style chronically.  Chronically loud.   No AIM noted     Lab Review:     Component Value Date/Time   NA 137 09/06/2024 1542   K 4.1 09/06/2024 1542   CL 101 09/06/2024 1542   CO2 24 09/06/2024 1542   GLUCOSE 142 (H) 09/06/2024 1542   BUN 13 09/06/2024 1542   CREATININE 1.15 09/06/2024 1542   CREATININE 1.24 06/18/2018 1502   CALCIUM  10.0 09/06/2024 1542   PROT 8.3 09/06/2024 1542   ALBUMIN 5.0 09/06/2024 1542   AST 20 09/06/2024 1542   ALT 21 09/06/2024 1542   ALKPHOS 85 09/06/2024 1542   BILITOT 0.4 09/06/2024 1542   GFRNONAA >60 03/09/2024 1144   GFRNONAA 84 04/02/2018 1551   GFRAA 97 04/02/2018 1551       Component Value Date/Time   WBC 9.6 09/06/2024 1542   RBC 5.35 09/06/2024 1542   HGB 15.1 09/06/2024 1542   HCT 45.6 09/06/2024 1542   PLT 243.0 09/06/2024 1542   MCV 85.2 09/06/2024 1542   MCH 28.2 03/09/2024 1144   MCHC 33.2 09/06/2024 1542   RDW 13.7 09/06/2024 1542   LYMPHSABS 1.7 09/06/2024 1542   MONOABS 0.7 09/06/2024 1542   EOSABS 0.1 09/06/2024 1542   BASOSABS 0.0 09/06/2024 1542    Lithium  Lvl  Date Value Ref Range Status  09/09/2019 1.1 0.6 - 1.2 mmol/L Final    12/14/19 lithium  level 0.6 (trough 13 hours) , normal BMP and calcium    No results found for: PHENYTOIN, PHENOBARB, VALPROATE, CBMZ   .res Assessment: Plan:    Aijalon Kirtz was seen today for follow-up, manic behavior, depression, add and anxiety.  Diagnoses and all orders for this visit:  Bipolar I disorder, most recent episode (or current) manic (HCC)  Attention deficit hyperactivity disorder (ADHD), combined type  Generalized anxiety disorder  Panic disorder with  agoraphobia  Obstructive sleep apnea  Insomnia due to mental condition  Marijuana abuse in remission    Mt has had significant disruptive mood and psychotic symptoms and substance abuse over the course of his treatment here.  It has been evident in chronic occupational dysfunction and some relational problems.  He is also been very prone to EPS and elevated blood sugars from atypicals making it difficult to get adequate mood stabilization.   Has a history of severe pancreatitis from Depakote.  He did not have an adequate response to carbamazepine.   lithium  was insufficient to control his symptoms in monotherapy and he developed diabetes insipidus.  There are no available mood stabilizers that do not have a warning of elevating blood sugar and cholesterol.  He has tried all the mood stabilizers that do not have this warning.  All of the available mood stabilizers left that have any effectiveness are antipsychotics and the FDA has required a class warning on all antipsychotics about blood sugar and cholesterol effects.  This is true despite the fact that not all antipsychotics are equal and increasing these risks.  The patient's reading of these potential side effects is complicating finding an effective treatment.  The only way to prove whether or not these meds are elevating his blood sugar and cholesterol is to stop the medications for a period of time.  This exposes him to manic risk but there is no chance of finding an adequate mood stabilizer that will satisfy him until we can prove whether or not these meds are in fact affecting his blood sugar, cholesterol, blood pressure, and pulse.  Latuda   Been the best med so far for him.    Answered questions with no other good options obvious. Sleepiness was not better with reduction in Latuda  from 120 to 60 mg daily. Manic overactivity, reactivity is better with increase Latuda .  He has a low stress tolerance.  He can be easily agitated in  public. Continue Latuda  160 mg PM.  Is getting more benefit.. .   Discussed potential metabolic side effects associated with atypical antipsychotics, as well as potential risk for movement side effects. Advised pt to contact office if movement side effects occur.   continue Hydroxyzine  10 mg HS prn  This has been helpful for anxiety and itching and may have some mild antimanic effect.  OK to continue with Med[facility] Fischer for therapy  Previous discussion about his desire to pursue disability due to his difficulty dealing with the public and work situations.  He has had chronic problems with work as previously noted.  Most of the time that he is involved difficulty with social interaction.  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  He has been passed over for potential promotions into managerial positions because he cannot handle that level of work performance.   He feels that he does not have another option because of repeated job failures. He reports applying to multiple jobs including data entry and non-public jobs and can't get an interview.  Disc this again an dhe is intent on pursuing disability.  Says he has had 15 jobs but never get back level 1 when hired.   Asks for   [facility], [place] [phone] to get documents from us .    We discussed the risks and harms associated with this ability including the social isolation and inactivity which tend to make depression worse.  Expressed concerns to him that his lack of employment is contributing to his current depression bc isolation.  He says he games for hours daily and that gives him social interaction.   He has continued abstinence from marijuana is encouraged.  He has a history of cannabinoid hyperemesis which finally convinced him to  discontinue the marijuana.  His mood disorder and thought disorganization have improved since being off the marijuana.  He has a very long history of heavy marijuana dependence.   Constipation management effective  Sleep hygiene. Disc OSA and how it affects alertness and started CPAP and much better.    ADD tx is reasonable bc job affected by difficulty with handling two tasks at once.  Forgetful and can't get back on task.  Hx Work restrictions bc of it.  No regular stimulant. Ok trial modafinil  off label for this problem.  Also to help OSA.  Much less likley to trigger psychosis than traditional stimulants. Continue DT marked benefit: Modafinil  200 mg AM.  He wants to retry Caplyta  for bipolar dep.  But can't stop  Latuda  bc Caplyta  didn't control manic tendencies before. Ok Caplyta  42 mg HS daily without othe r med changes.   FU 3 mos  Lorene Macintosh, MD, DFAPA  Please see After Visit Summary for patient specific instructions.  Future Appointments  Date Time Provider Department Center  11/09/2024  2:30 PM Tobie Arleta SQUIBB, MD [facility] None  11/10/2024  4:00 PM Bernardo Bruckner, [facility] None  11/25/2024  2:20 PM Billy Philippe SAUNDERS, NP [facility] [address]  12/06/2024  3:00 PM Bernardo Bruckner, [facility] None  01/03/2025  8:45 AM Cary, Amy, NP [facility] None  01/05/2025  3:00 PM Bernardo Bruckner, [facility] None  01/19/2025  3:00 PM Trixie File, MD [facility] None  01/31/2025  3:00 PM Bernardo Bruckner, [facility] None  03/08/2025  2:15 PM Alm Delon SAILOR, DO [facility] None    No orders of the defined types were placed in this encounter.      -------------------------------

## 2024-11-08 NOTE — Telephone Encounter (Signed)
 Pharmacy Patient Advocate Encounter   Received notification from Patient Advice Request messages that prior authorization for Novolog  is required/requested.   Insurance verification completed.   The patient is insured through [facility].   Per test claim: The current 90 day co-pay is, $4.  No PA needed at this time. This test claim was processed through [facility]- copay amounts may vary at other pharmacies due to pharmacy/plan contracts, or as the patient moves through the different stages of their insurance plan.     Insurance prefers the generic version of Novolog  (Insulin  aspart)

## 2024-11-09 ENCOUNTER — Ambulatory Visit (INDEPENDENT_AMBULATORY_CARE_PROVIDER_SITE_OTHER): Admitting: Internal Medicine

## 2024-11-09 ENCOUNTER — Encounter: Payer: Self-pay | Admitting: Internal Medicine

## 2024-11-09 ENCOUNTER — Other Ambulatory Visit: Payer: Self-pay

## 2024-11-09 VITALS — BP 138/78 | HR 82 | Temp 98.1°F | Ht 69.25 in | Wt 232.3 lb

## 2024-11-09 DIAGNOSIS — J3089 Other allergic rhinitis: Secondary | ICD-10-CM | POA: Diagnosis not present

## 2024-11-09 MED ORDER — NOVOLOG FLEXPEN 100 UNIT/ML ~~LOC~~ SOPN: 25.0000 [IU] | PEN_INJECTOR | Freq: Three times a day (TID) | SUBCUTANEOUS | 2 refills | Status: DC

## 2024-11-09 NOTE — Progress Notes (Signed)
 "  NEW PATIENT  Date of Service/Encounter:  11/09/2024  Consult requested by: Norleen Lynwood ORN, MD   Subjective:   Cody Short (DOB: 1976-03-03) is a 49 y.o. male who presents to the clinic on 11/09/2024 with a chief complaint of Allergies (10-15 years, every time he eats his nose runs. ) .    History obtained from: chart review and patient.    Rhinitis:  Started 15+ years.   Symptoms include: congestion, runny nose, sneezing , congestion that can't breathe  Occurs year-round Potential triggers: eating especially   Treatments tried:  Allegra  Nasacort   Singulair  listed in chart but stopped it because it was not working   Previous allergy testing: no History of sinus surgery: none Nonallergic triggers: eating especially spicy foods      Reviewed:  09/15/2024: seen by Dr Trixie Endo for DM, HLD, obesity and subacute thyroiditis. On metformin , jardiance , novolog , lantus .   09/07/2024: seen by Derm for acne- Rx doxycycline , alternate arazlo  and onexin.   09/06/2024: seen by Dr Norleen for allergic rhinitis- sneezing, congestion, itching.  Use Nasacort  and Allegra .  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 Recurs if cannot walk for a week.      TOBACCO ABUSE, HX OF 07/31/2009   Past Surgical History: Past Surgical History:  Procedure Laterality Date   pilar cystectomy      Family History: Family History  Problem Relation Age of Onset   Diabetes Mother    Breast cancer Mother        was told not hereditary   Cirrhosis Mother        fatty liver   High blood pressure Mother    High blood pressure  Father    Diabetes Father    Melanoma Maternal Uncle    Colon cancer Neg Hx    Stomach cancer Neg Hx    Pancreatitis Neg Hx    Heart disease Neg Hx    Kidney disease Neg Hx    Liver disease Neg Hx     Social History:  Flooring in bedroom: carpet Pets: cat Tobacco use/exposure: 6 pack year history   Medication List:  Allergies as of 11/09/2024       Reactions   Accutane [isotretinoin]    Pancreatitis    Divalproex Sodium Other (See Comments)   unknown   Methylphenidate Hcl    Aggravate bipolar   Oxycodone Hcl    REACTION: hallucinations   Ozempic  (0.25 Or 0.5 Mg-dose) [semaglutide (0.25 Or 0.5mg -dos)] Nausea Only   Paroxetine    Aggravate bipolar disorder   Rosuvastatin  Other (See Comments)   Myopathy -muscle cramps, elevated CK        Medication List        Accurate as of November 09, 2024  2:23 PM. If you have any questions, ask your nurse or doctor.          Accu-Chek Guide test strip Generic drug: glucose blood USE AS INSTRUCTED TO CHECK BLOOD SUGAR 4 TIMES A DAY   Accu-Chek Guide Test test strip Generic drug: glucose blood Use  to check blood sugar 4 times a day   Accu-Chek Softclix Lancets lancets Use as instructed to check blood sugar 4 times daily. DX:E11.65   Arazlo  0.045 % Lotn Generic drug: Tazarotene  Apply 1 Application topically at bedtime. Start by using it 2x per week for 1 month, then move to every other night   Clindamycin  Phos-Benzoyl Perox 1.2-3.75 % Gel Commonly known as: Onexton Apply 1 Application topically in the morning.   Clindamycin -Benzoyl Per (Refr) gel Apply 1 Application topically daily.   cyclobenzaprine  5 MG tablet Commonly known as: FLEXERIL  Take 1 tablet (5 mg total) by mouth 3 (three) times daily as needed.   doxycycline  50 MG capsule Commonly known as: VIBRAMYCIN  Take one tablet daily with large meal   empagliflozin  10 MG Tabs tablet Commonly known as: Jardiance  Take 1 tablet (10 mg total) by mouth daily  before breakfast.   fexofenadine  180 MG tablet Commonly known as: ALLEGRA  Take 1 tablet (180 mg total) by mouth daily.   FreeStyle [employer] Use as instructed to check sugar 4 times daily   gemfibrozil  600 MG tablet Commonly known as: Lopid  Take 1 tablet (600 mg total) by mouth 2 (two) times daily before a meal.   hydrOXYzine  10 MG tablet Commonly known as: ATARAX  TAKE 1 TABLET BY MOUTH 3 TIMES  DAILY AS NEEDED FOR ITCHING OR  INSOMNIA   Lantus  SoloStar 100 UNIT/ML Solostar Pen Generic drug: insulin  glargine INJECT SUBCUTANEOUSLY 60 UNITS  DAILY   lumateperone  tosylate 42 MG capsule Commonly known as: CAPLYTA  Take 1 capsule (42 mg total) by mouth daily.   lurasidone  80 MG Tabs tablet Commonly known as: LATUDA  TAKE 2 TABLETS BY MOUTH DAILY  WITH BREAKFAST   metFORMIN  500 MG 24 hr tablet Commonly known as: GLUCOPHAGE -XR TAKE 2 TABLETS BY MOUTH TWICE  DAILY WITH MEALS   modafinil  200 MG tablet Commonly known as: Provigil  Take 1 tablet (200 mg total) by mouth daily.   montelukast  10 MG tablet Commonly known as: SINGULAIR  Take 1 tablet (10 mg total) by mouth daily.   NovoLOG  FlexPen 100 UNIT/ML FlexPen Generic drug: insulin  aspart Inject 25-35 Units into the skin 3 (three) times daily with meals.   rosuvastatin  20 MG tablet Commonly known as: Crestor  Take 1 tablet (20 mg total) by mouth daily.   sildenafil  100 MG tablet Commonly known as: Viagra  Take 0.5-1 tablets (50-100 mg total) by mouth daily as needed.   triamcinolone  55 MCG/ACT Aero nasal inhaler Commonly known as: NASACORT  Place 2 sprays into the nose daily.   valsartan  160 MG tablet Commonly known as: DIOVAN  Take 1 tablet (160 mg total) by mouth daily.         REVIEW OF SYSTEMS: Pertinent positives and negatives discussed in HPI.   Objective:   Physical Exam: BP 138/78 (BP Location: Right Arm, Patient Position: Sitting, Cuff Size: Normal)   Pulse 82   Temp 98.1 F (36.7 C) (Temporal)   Ht  5' 9.25 (1.759 m)   Wt 232 lb 4.8 oz (105.4 kg)   SpO2 92%   BMI 34.06 kg/m  Body mass index is 34.06 kg/m. GEN: alert, well developed HEENT: clear conjunctiva, nose with + mild inferior turbinate hypertrophy, pink nasal mucosa, slight clear rhinorrhea, + cobblestoning HEART: regular rate and rhythm, no murmur LUNGS: clear to auscultation bilaterally, no coughing, unlabored respiration ABDOMEN: soft, non distended  SKIN: no rashes or lesions  Assessment:   1. Other allergic rhinitis     Plan/Recommendations:   Other Allergic  Rhinitis: - Due to turbinate hypertrophy and unresponsive to over the counter meds, will perform skin testing to identify aeroallergen triggers.  Likely has component of vasomotor rhinitis due to symptoms with eating.  - Use nasal saline rinses before nose sprays such as with Neilmed Sinus Rinse.  Use distilled water.   - Use Nasacort  2 sprays each nostril daily. Aim upward and outward. - Use Allegra  180mg  daily.    Hold all anti-histamines (Xyzal, Allegra , Zyrtec, Claritin, Benadryl, Pepcid) 3 days prior to next visit.  Follow up: 1/20 at 230 for skin testing 1-55     Arleta Blanch, MD [facility]        "

## 2024-11-09 NOTE — Telephone Encounter (Signed)
 New Rx prescribed Caplyta  will submit PA

## 2024-11-09 NOTE — Patient Instructions (Addendum)
 Other Allergic Rhinitis: - Use nasal saline rinses before nose sprays such as with Neilmed Sinus Rinse.  Use distilled water.   - Use Nasacort  2 sprays each nostril daily. Aim upward and outward. - Use Allegra  180mg  daily.    Hold all anti-histamines (Xyzal, Allegra , Zyrtec, Claritin, Benadryl, Pepcid) 3 days prior to next visit.  Follow up: 1/20 at 230 for skin testing 1-55

## 2024-11-09 NOTE — Telephone Encounter (Signed)
 Next appt is 01/09/25. Czar states his pharmacy left him a voice mail message. Pharmacy said that they need an updated script for Caplyta  and [name] wants to know what this means?

## 2024-11-09 NOTE — Telephone Encounter (Signed)
 PA approved Caplyta  42 mg, [id number] Through 11/09/2025 [employer] at [phone]

## 2024-11-09 NOTE — Telephone Encounter (Signed)
 Called patient and he said he got a text that an updated Rx was needed. I called pharmacy and was told that Caplyta  is not covered. Pharmacy gave # to call - [phone].

## 2024-11-10 ENCOUNTER — Ambulatory Visit: Admitting: Mental Health

## 2024-11-10 DIAGNOSIS — F3110 Bipolar disorder, current episode manic without psychotic features, unspecified: Secondary | ICD-10-CM | POA: Diagnosis not present
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
## 2024-11-11 ENCOUNTER — Encounter: Payer: Self-pay | Admitting: Internal Medicine

## 2024-11-11 MED ORDER — INSULIN ASPART FLEXPEN 100 UNIT/ML ~~LOC~~ SOPN: PEN_INJECTOR | SUBCUTANEOUS | 2 refills | Status: DC

## 2024-11-14 MED ORDER — INSULIN ASPART FLEXPEN 100 UNIT/ML ~~LOC~~ SOPN: PEN_INJECTOR | SUBCUTANEOUS | 2 refills | Status: AC

## 2024-11-14 MED ORDER — INSULIN LISPRO (1 UNIT DIAL) 100 UNIT/ML (KWIKPEN): 25.0000 [IU] | PEN_INJECTOR | Freq: Three times a day (TID) | SUBCUTANEOUS | 3 refills | Status: AC

## 2024-11-14 NOTE — Addendum Note (Signed)
 Addended by: CLEOTILDE ROLIN RAMAN on: 11/14/2024 03:55 PM   Modules accepted: Orders

## 2024-11-14 NOTE — Addendum Note (Signed)
 Addended by: CLEOTILDE ROLIN RAMAN on: 11/14/2024 08:05 AM   Modules accepted: Orders

## 2024-11-15 ENCOUNTER — Ambulatory Visit: Admitting: Internal Medicine

## 2024-11-15 DIAGNOSIS — J3089 Other allergic rhinitis: Secondary | ICD-10-CM | POA: Diagnosis not present

## 2024-11-15 MED ORDER — AZELASTINE HCL 0.1 % NA SOLN: 2.0000 | Freq: Two times a day (BID) | NASAL | 5 refills | Status: AC | PRN

## 2024-11-15 MED ORDER — AZELASTINE HCL 0.1 % NA SOLN: 2.0000 | Freq: Two times a day (BID) | NASAL | 5 refills | Status: DC | PRN

## 2024-11-15 NOTE — Patient Instructions (Addendum)
 Chronic Rhinitis:  - Positive skin test 10/2024: none - Use nasal saline rinses before nose sprays such as with Neilmed Sinus Rinse.  Use distilled water.   - Use Nasacort  2 sprays each nostril daily. Aim upward and outward. - Use Azelastine  2 sprays each nostril twice daily as needed for runny nose, drainage, sneezing, congestion. Aim upward and outward. - Use Allegra  180mg  daily as needed for runny nose, sneezing, itchy watery eyes. - Discontinue Singulair /Montelukast .

## 2024-11-15 NOTE — Progress Notes (Signed)
 "  FOLLOW UP Date of Service/Encounter:  11/15/24   Subjective:  Cody Short (DOB: [birth year]) is a 49 y.o. male who [facility] for follow up for skin testing.   History obtained from: chart review and patient.  Anti histamines held.   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 Recurs if cannot walk for a week.      TOBACCO ABUSE, HX OF 07/31/2009    Objective:  There were no vitals taken for this visit. There is no height or weight on file to calculate BMI. Physical Exam: GEN: alert, well developed HEENT: clear conjunctiva, MMM LUNGS: unlabored respiration  Skin Testing:  Skin prick testing was placed, which includes aeroallergens/foods, histamine control, and saline control.  Verbal consent was obtained prior to placing test.  Patient tolerated procedure well.  Allergy  testing results were read and interpreted by myself, documented by clinical staff. Adequate positive and negative control.  Positive results to:  Results discussed with patient/family.  Airborne Adult Perc - 11/15/24 1431     Time Antigen Placed 1431    Allergen Manufacturer Jestine    Location Back    Number of Test 55    1. Control-Buffer 50% Glycerol Negative    2. Control-Histamine 3+    3. Bahia Negative    4. Bermuda Negative    5. Johnson Negative    6. [place]  Blue Negative    7. Meadow Fescue Negative    8. Perennial Rye Negative    9. Timothy Negative    10. Ragweed Mix Negative    11. Cocklebur Negative    12. Plantain,  English Negative    13. Baccharis Negative    14.  Dog Fennel Negative    15. Russian Thistle Negative    16. Lamb's Quarters Negative    17. Sheep Sorrell Negative    18. Rough Pigweed Negative    19. Marsh Elder, Rough Negative    20. Mugwort, Common Negative    21. Box, Elder Negative    22. Cedar, red Negative    23. Sweet Gum Negative    24. Pecan Pollen Negative    25. Pine Mix Negative    26. Walnut, Black Pollen Negative    27. Red Mulberry Negative    28. Ash Mix Negative    29. Birch Mix Negative    30. Beech American Negative    31. Cottonwood, Eastern Negative    32. Hickory, White Negative    33. Maple Mix Negative    34. Oak, Eastern Mix Negative    35. Sycamore Eastern Negative    36. Alternaria Alternata Negative    37. Cladosporium Herbarum Negative    38. Aspergillus Mix Negative    39. Penicillium Mix Negative    40. Bipolaris Sorokiniana (Helminthosporium) Negative    41. Drechslera Spicifera (Curvularia) Negative    42. Mucor Plumbeus Negative    43. Fusarium Moniliforme Negative    44. Aureobasidium Pullulans (pullulara) Negative    45. Rhizopus Oryzae Negative  46. Botrytis Cinera Negative    47. Epicoccum Nigrum Negative    48. Phoma Betae Negative    49. Dust Mite Mix Negative    50. Cat Hair 10,000 BAU/ml Negative    51.  Dog Epithelia Negative    52. Mixed Feathers Negative    53. Horse Epithelia Negative    54. Cockroach, German Negative    55. Tobacco Leaf Negative           Assessment:   1. Other allergic rhinitis     Plan/Recommendations:  Other Allergic Rhinitis: - Due to turbinate hypertrophy and unresponsive to over the counter meds, will perform skin testing to identify aeroallergen triggers.   - Positive skin test 10/2024: none - Use nasal saline rinses before nose sprays such as with Neilmed Sinus Rinse.  Use distilled water.   - Use Nasacort  2 sprays each nostril daily. Aim upward and outward. - Use Azelastine  2 sprays each nostril twice daily as needed for runny nose,  drainage, sneezing, congestion. Aim upward and outward. - Use Allegra  180mg  daily as needed for runny nose, sneezing, itchy watery eyes. - Discontinue Singulair /Montelukast .       Return in about 3 months (around 02/13/2025).  Arleta Blanch, MD [facility]       "

## 2024-11-15 NOTE — Addendum Note (Signed)
 Addended by: Dylynn Ketner on: 11/15/2024 03:07 PM   Modules accepted: Orders

## 2024-11-25 ENCOUNTER — Ambulatory Visit: Admitting: Family Medicine

## 2024-11-25 ENCOUNTER — Encounter: Payer: Self-pay | Admitting: Family Medicine

## 2024-11-25 VITALS — BP 128/82 | HR 76 | Temp 98.0°F | Ht 69.0 in | Wt 235.0 lb

## 2024-11-25 DIAGNOSIS — Z7689 Persons encountering health services in other specified circumstances: Secondary | ICD-10-CM

## 2024-11-25 DIAGNOSIS — I10 Essential (primary) hypertension: Secondary | ICD-10-CM | POA: Diagnosis not present

## 2024-11-25 DIAGNOSIS — E1165 Type 2 diabetes mellitus with hyperglycemia: Secondary | ICD-10-CM

## 2024-11-25 DIAGNOSIS — N529 Male erectile dysfunction, unspecified: Secondary | ICD-10-CM

## 2024-11-25 DIAGNOSIS — E559 Vitamin D deficiency, unspecified: Secondary | ICD-10-CM

## 2024-11-25 DIAGNOSIS — Z23 Encounter for immunization: Secondary | ICD-10-CM

## 2024-11-25 DIAGNOSIS — E782 Mixed hyperlipidemia: Secondary | ICD-10-CM

## 2024-11-25 NOTE — Assessment & Plan Note (Signed)
 Stable. Continue Rosuvastatin  20mg  daily and Gemfibrozil  600mg  BID. Ordered CMP and lipid panel.

## 2024-11-25 NOTE — Assessment & Plan Note (Signed)
 Not taking supplement. Ordered vitamin D  level.

## 2024-11-25 NOTE — Assessment & Plan Note (Signed)
 Blood pressure is controlled. Continue Valsartan  160mg  daily. Ordered CMP.

## 2024-11-25 NOTE — Assessment & Plan Note (Signed)
 Continue Sildenafil as needed

## 2024-11-25 NOTE — Progress Notes (Signed)
 "  New Patient Office Visit  Subjective   Patient ID: Cody Short, male    DOB: 06/09/1976  Age: 49 y.o. MRN: [id number]  CC:  Chief Complaint  Patient presents with   Establish Care    HPI Cody Short presents to establish care with new provider.  Patients previous primary care provider: [facility] with Dr. Lynwood Rush. Last seen 09/06/2024.   Specialist: [facility] at [facility] Dr. Arleta Blanch [facility] Endocrinology with Dr. Lela Fendt  Behavioral [facility] Dr. Lorene Macintosh and [facility] Dermatology with Dr. Delon Lenis  [facility] with Greig Forbes, NP  [facility]   HTN: Chronic. Patient is taking Valsartan  160mg  daily. Denies monitoring his blood pressure at home. Denies CP, SHOB, HA, dizziness, or lightheadedness.  BP Readings from Last 3 Encounters:  11/25/24 128/82  11/09/24 138/78  09/15/24 120/70    ED: Chronic. Patient is prescribed Sildenafil  50-100mg  daily PRN. Not used it yet, but has it incase. He reports he has problem with forming an erection.   Hyperlipidemia: Chronic. Patient is taking Rosuvastatin  20mg  daily and Gemfibrozil  600mg  BID. Denies muscle pain and abdomen pain.   All other medications are managed by specialist:  Lab Results  Component Value Date   CHOL 165 09/06/2024   HDL 44.60 09/06/2024   LDLCALC 82 09/06/2024   LDLDIRECT 171.0 03/03/2024   TRIG 192.0 (H) 09/06/2024   CHOLHDL 4 09/06/2024    Outpatient Encounter Medications as of 11/25/2024  Medication Sig   Accu-Chek Softclix Lancets lancets Use as instructed to check blood sugar 4 times daily. DX:E11.65   azelastine  (ASTELIN ) 0.1 % nasal spray Place 2 sprays into both nostrils 2 (two) times daily as needed for rhinitis. Use in each nostril as directed   Blood Glucose Monitoring Suppl (FREESTYLE LITE) DEVI Use as instructed to  check sugar 4 times daily   CAPLYTA  42 MG capsule TAKE 1 CAPSULE (42 MG TOTAL) BY MOUTH DAILY.   Clindamycin  Phos-Benzoyl Perox (ONEXTON) 1.2-3.75 % GEL Apply 1 Application topically in the morning.   doxycycline  (VIBRAMYCIN ) 50 MG capsule Take one tablet daily with large meal   empagliflozin  (JARDIANCE ) 10 MG TABS tablet Take 1 tablet (10 mg total) by mouth daily before breakfast.   fexofenadine  (ALLEGRA ) 180 MG tablet Take 1 tablet (180 mg total) by mouth daily.   gemfibrozil  (LOPID ) 600 MG tablet Take 1 tablet (600 mg total) by mouth 2 (two) times daily before a meal.   glucose blood (ACCU-CHEK GUIDE TEST) test strip Use to check blood sugar 4 times a day   glucose blood (ACCU-CHEK GUIDE) test strip USE AS INSTRUCTED TO CHECK BLOOD SUGAR 4 TIMES A DAY   hydrOXYzine  (ATARAX ) 10 MG tablet TAKE 1 TABLET BY MOUTH 3 TIMES  DAILY AS NEEDED FOR ITCHING OR  INSOMNIA   Insulin  Aspart FlexPen (NOVOLOG ) 100 UNIT/ML Inject 25-35 Units into the skin 3 (three) times daily with meals.   insulin  glargine (LANTUS  SOLOSTAR) 100 UNIT/ML Solostar Pen INJECT SUBCUTANEOUSLY 60 UNITS  DAILY   insulin  lispro (HUMALOG ) 100 UNIT/ML KwikPen Inject 25-35 Units into the skin 3 (three) times daily.   lurasidone  (LATUDA ) 80 MG TABS tablet TAKE 2 TABLETS BY MOUTH DAILY  WITH BREAKFAST   metFORMIN  (GLUCOPHAGE -XR) 500 MG 24 hr tablet TAKE 2 TABLETS BY MOUTH TWICE  DAILY WITH MEALS   modafinil  (PROVIGIL ) 200 MG tablet Take  1 tablet (200 mg total) by mouth daily.   rosuvastatin  (CRESTOR ) 20 MG tablet Take 1 tablet (20 mg total) by mouth daily.   sildenafil  (VIAGRA ) 100 MG tablet Take 0.5-1 tablets (50-100 mg total) by mouth daily as needed.   Tazarotene  (ARAZLO ) 0.045 % LOTN Apply 1 Application topically at bedtime. Start by using it 2x per week for 1 month, then move to every other night   triamcinolone  (NASACORT ) 55 MCG/ACT AERO nasal inhaler Place 2 sprays into the nose daily.   valsartan  (DIOVAN ) 160 MG tablet Take 1 tablet  (160 mg total) by mouth daily.   [DISCONTINUED] Clindamycin -Benzoyl Per, Refr, gel Apply 1 Application topically daily.   [DISCONTINUED] montelukast  (SINGULAIR ) 10 MG tablet Take 1 tablet (10 mg total) by mouth daily.   [DISCONTINUED] cyclobenzaprine  (FLEXERIL ) 5 MG tablet Take 1 tablet (5 mg total) by mouth 3 (three) times daily as needed. (Patient not taking: Reported on 11/25/2024)   No facility-administered encounter medications on file as of 11/25/2024.    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 Recurs if cannot walk for a week.      Sleep apnea    TOBACCO ABUSE, HX OF 07/31/2009    Past Surgical History:  Procedure Laterality Date   pilar cystectomy      Family History  Problem Relation Age of Onset   Diabetes Mother    Breast cancer Mother        was told not hereditary   Cirrhosis Mother        fatty liver   High blood pressure Mother    Asthma Mother    Cancer Mother    High blood pressure Father    Diabetes Father    Cancer Father    Hypertension Father    Melanoma Maternal Uncle    Cancer Maternal Uncle    Arthritis Paternal Grandmother    Cancer Paternal Grandmother    Drug abuse Brother    Colon cancer Neg Hx    Stomach cancer Neg Hx    Pancreatitis Neg Hx    Heart disease Neg Hx    Kidney disease Neg Hx    Liver disease Neg Hx     Social History   Socioeconomic History   Marital status: Single    Spouse name: Not on file   Number of children: 0   Years of education: Not on file   Highest education level: Bachelor's degree (e.g., BA, AB, BS)  Occupational History   Not on file  Tobacco Use    Smoking status: Former    Current packs/day: 0.00    Average packs/day: 2.5 packs/day for 10.0 years (25.0 ttl pk-yrs)    Types: Cigarettes    Start date: 10/27/1996    Quit date: 10/27/2006    Years since quitting: 18.0   Smokeless tobacco: Never  Vaping Use   Vaping status: Never Used  Substance and Sexual Activity   Alcohol use: Not Currently    Comment: none at all   Drug use: Not Currently    Types: Marijuana  Comment: Clean for 15 years   Sexual activity: Never  Other Topics Concern   Not on file  Social History Narrative   Single. Not dating currently. No kids.    Lives with mom and dad      Hobbies: video games PC   Caffeine: 4 C a day   Social Drivers of Health   Tobacco Use: Medium Risk (11/25/2024)   Patient History    Smoking Tobacco Use: Former    Smokeless Tobacco Use: Never    Passive Exposure: Not on file  Financial Resource Strain: High Risk (09/02/2024)   Overall Financial Resource Strain (CARDIA)    Difficulty of Paying Living Expenses: Very hard  Food Insecurity: Food Insecurity Present (09/02/2024)   Epic    Worried About Programme Researcher, Broadcasting/film/video in the Last Year: Often true    Ran Out of Food in the Last Year: Sometimes true  Transportation Needs: No Transportation Needs (09/02/2024)   Epic    Lack of Transportation (Medical): No    Lack of Transportation (Non-Medical): No  Physical Activity: Insufficiently Active (09/02/2024)   Exercise Vital Sign    Days of Exercise per Week: 2 days    Minutes of Exercise per Session: 30 min  Stress: Patient Declined (09/02/2024)   [employer] of Occupational Health - Occupational Stress Questionnaire    Feeling of Stress: Patient declined  Social Connections: Moderately Isolated (09/02/2024)   Social Connection and Isolation Panel    Frequency of Communication with Friends and Family: More than three times a week    Frequency of Social Gatherings with Friends and Family: Never    Attends Religious Services:  Never    Database Administrator or Organizations: Yes    Attends Banker Meetings: Never    Marital Status: Never married  Intimate Partner Violence: Not At Risk (11/25/2024)   Epic    Fear of Current or Ex-Partner: No    Emotionally Abused: No    Physically Abused: No    Sexually Abused: No  Depression (PHQ2-9): Medium Risk (11/25/2024)   Depression (PHQ2-9)    PHQ-2 Score: 10  Alcohol Screen: Low Risk (11/25/2024)   Alcohol Screen    Last Alcohol Screening Score (AUDIT): 0  Housing: Low Risk (09/02/2024)   Epic    Unable to Pay for Housing in the Last Year: No    Number of Times Moved in the Last Year: 0    Homeless in the Last Year: No  Utilities: Not At Risk (11/25/2024)   Epic    Threatened with loss of utilities: No  Health Literacy: Adequate Health Literacy (11/25/2024)   B1300 Health Literacy    Frequency of need for help with medical instructions: Never    ROS See HPI above    Objective  BP 128/82   Pulse 76   Temp 98 F (36.7 C) (Oral)   Ht 5' 9 (1.753 m)   Wt 235 lb (106.6 kg)   SpO2 97%   BMI 34.70 kg/m   Physical Exam Vitals reviewed.  Constitutional:      General: He is not in acute distress.    Appearance: Normal appearance. He is obese. He is not ill-appearing, toxic-appearing or diaphoretic.  HENT:     Head: Normocephalic and atraumatic.  Eyes:     General:        Right eye: No discharge.        Left eye: No discharge.     Conjunctiva/sclera: Conjunctivae normal.  Cardiovascular:     Rate and Rhythm: Normal rate and regular rhythm.     Heart sounds: Normal heart sounds. No murmur heard.    No friction rub. No gallop.  Pulmonary:     Effort: Pulmonary effort is normal. No respiratory distress.     Breath sounds: Normal breath sounds.  Musculoskeletal:        General: Normal range of motion.  Skin:    General: Skin is warm and dry.  Neurological:     General: No focal deficit present.     Mental Status: He is alert and  oriented to person, place, and time. Mental status is at baseline.  Psychiatric:        Mood and Affect: Mood normal.        Behavior: Behavior normal.        Thought Content: Thought content normal.        Judgment: Judgment normal.      Assessment & Plan:  Essential hypertension Assessment & Plan: Blood pressure is controlled. Continue Valsartan  160mg  daily. Ordered CMP.  Orders: -     Comprehensive metabolic panel with GFR; Future  Immunization due -     Flu vaccine trivalent PF, 6mos and older(Flulaval,Afluria,Fluarix,Fluzone)  Erectile dysfunction, unspecified erectile dysfunction type Assessment & Plan: Continue Sildenafil  as needed.    Mixed hyperlipidemia Assessment & Plan: Stable. Continue Rosuvastatin  20mg  daily and Gemfibrozil  600mg  BID. Ordered CMP and lipid panel.   Orders: -     Comprehensive metabolic panel with GFR; Future -     Lipid panel; Future  Vitamin D  deficiency Assessment & Plan: Not taking supplement. Ordered vitamin D  level.   Orders: -     VITAMIN D  25 Hydroxy (Vit-D Deficiency, Fractures); Future  Type 2 diabetes mellitus with hyperglycemia, without long-term current use of insulin  [facility]) Assessment & Plan: Being managed by [facility] End[facility]. Ordered A1c and CMP. Will provide results with specialist.   Orders: -     Comprehensive metabolic panel with GFR; Future -     Hemoglobin A1c; Future  Encounter to establish care  1.Review health maintenance:  -Tdap vaccine: May obtain at local pharmacy  -HPV vaccine: May have had at Dr. Almarie Scala  -Ophthalmology: [facility] 2025 -Hep B vaccine: May have had at Dr. Almarie Scala office  -Influenza vaccine: Administered  2.Continue all medications.  3.Follow up specialist.  4.Chronic management labs will be due on or after 12/08/2023, please schedule a lab visit with fasting.  Return in about 7 months (around 06/25/2025) for physical; 2 week (after 12/07/2024) lab fasting .    Julez Huseby, NP "

## 2024-11-28 ENCOUNTER — Telehealth: Payer: Self-pay | Admitting: Psychiatry

## 2024-11-28 NOTE — Telephone Encounter (Signed)
 [name] lvm that the captylta that he is taking is giving high blood pressure. He is also experiencing more depression and anxiety. He is chocking on his salivia in his sleep with a cpap machine. Please call him at [phone]

## 2024-11-30 ENCOUNTER — Other Ambulatory Visit: Payer: Self-pay | Admitting: Psychiatry

## 2024-11-30 DIAGNOSIS — F3110 Bipolar disorder, current episode manic without psychotic features, unspecified: Secondary | ICD-10-CM

## 2024-12-01 NOTE — Telephone Encounter (Signed)
 Ok stop Caplyta  and continue Latuda 

## 2024-12-02 NOTE — Telephone Encounter (Signed)
 Spoke with Pt reviewed recommendations, verbally understood, stated he stopped the Caplyta  3 days ago.

## 2024-12-06 ENCOUNTER — Ambulatory Visit: Admitting: Mental Health

## 2024-12-09 ENCOUNTER — Other Ambulatory Visit

## 2024-12-15 ENCOUNTER — Telehealth: Payer: Medicaid Other | Admitting: Family Medicine

## 2025-01-03 ENCOUNTER — Telehealth: Admitting: Family Medicine

## 2025-01-05 ENCOUNTER — Ambulatory Visit: Admitting: Mental Health

## 2025-01-09 ENCOUNTER — Telehealth: Admitting: Psychiatry

## 2025-01-19 ENCOUNTER — Ambulatory Visit: Admitting: Internal Medicine

## 2025-01-31 ENCOUNTER — Ambulatory Visit: Admitting: Mental Health

## 2025-02-14 ENCOUNTER — Ambulatory Visit: Admitting: Allergy

## 2025-03-08 ENCOUNTER — Ambulatory Visit: Admitting: Dermatology

## 2025-06-23 ENCOUNTER — Encounter: Admitting: Family Medicine

## 2025-09-07 ENCOUNTER — Ambulatory Visit: Admitting: Family Medicine
